# Patient Record
Sex: Female | Born: 1935 | Race: Black or African American | Hispanic: No | Marital: Married | Smoking: Never smoker
Health system: Southern US, Community
[De-identification: ages and names within clinical notes are randomized; demographics above are authoritative.]

## PROBLEM LIST (undated history)

## (undated) DIAGNOSIS — E1122 Type 2 diabetes mellitus with diabetic chronic kidney disease: Secondary | ICD-10-CM

## (undated) DIAGNOSIS — I10 Essential (primary) hypertension: Secondary | ICD-10-CM

## (undated) DIAGNOSIS — J189 Pneumonia, unspecified organism: Secondary | ICD-10-CM

## (undated) DIAGNOSIS — I5030 Unspecified diastolic (congestive) heart failure: Secondary | ICD-10-CM

## (undated) DIAGNOSIS — N184 Chronic kidney disease, stage 4 (severe): Secondary | ICD-10-CM

## (undated) DIAGNOSIS — K219 Gastro-esophageal reflux disease without esophagitis: Secondary | ICD-10-CM

## (undated) DIAGNOSIS — Z8739 Personal history of other diseases of the musculoskeletal system and connective tissue: Secondary | ICD-10-CM

## (undated) DIAGNOSIS — E7800 Pure hypercholesterolemia, unspecified: Secondary | ICD-10-CM

## (undated) DIAGNOSIS — N200 Calculus of kidney: Secondary | ICD-10-CM

## (undated) HISTORY — PX: ABDOMINAL HYSTERECTOMY: SHX81

## (undated) HISTORY — PX: LITHOTRIPSY: SUR834

---

## 1997-12-29 ENCOUNTER — Other Ambulatory Visit: Admission: RE | Admit: 1997-12-29 | Discharge: 1997-12-29 | Payer: Self-pay | Admitting: Family Medicine

## 1999-07-20 ENCOUNTER — Encounter: Payer: Self-pay | Admitting: Emergency Medicine

## 1999-07-20 ENCOUNTER — Emergency Department (HOSPITAL_COMMUNITY): Admission: EM | Admit: 1999-07-20 | Discharge: 1999-07-20 | Payer: Self-pay | Admitting: Emergency Medicine

## 1999-12-03 ENCOUNTER — Encounter: Admission: RE | Admit: 1999-12-03 | Discharge: 1999-12-03 | Payer: Self-pay | Admitting: Family Medicine

## 1999-12-03 ENCOUNTER — Encounter: Payer: Self-pay | Admitting: Family Medicine

## 1999-12-20 ENCOUNTER — Other Ambulatory Visit: Admission: RE | Admit: 1999-12-20 | Discharge: 1999-12-20 | Payer: Self-pay | Admitting: Family Medicine

## 1999-12-22 ENCOUNTER — Encounter: Admission: RE | Admit: 1999-12-22 | Discharge: 1999-12-22 | Payer: Self-pay | Admitting: Family Medicine

## 1999-12-22 ENCOUNTER — Encounter: Payer: Self-pay | Admitting: Family Medicine

## 2000-06-05 ENCOUNTER — Encounter: Payer: Self-pay | Admitting: Otolaryngology

## 2000-06-05 ENCOUNTER — Encounter: Admission: RE | Admit: 2000-06-05 | Discharge: 2000-06-05 | Payer: Self-pay | Admitting: *Deleted

## 2000-12-20 ENCOUNTER — Other Ambulatory Visit: Admission: RE | Admit: 2000-12-20 | Discharge: 2000-12-20 | Payer: Self-pay | Admitting: Family Medicine

## 2001-04-23 ENCOUNTER — Encounter: Admission: RE | Admit: 2001-04-23 | Discharge: 2001-04-23 | Payer: Self-pay | Admitting: Family Medicine

## 2001-04-23 ENCOUNTER — Encounter: Payer: Self-pay | Admitting: Family Medicine

## 2001-09-07 ENCOUNTER — Encounter: Admission: RE | Admit: 2001-09-07 | Discharge: 2001-09-07 | Payer: Self-pay | Admitting: Family Medicine

## 2001-09-07 ENCOUNTER — Encounter: Payer: Self-pay | Admitting: Family Medicine

## 2002-01-08 ENCOUNTER — Other Ambulatory Visit: Admission: RE | Admit: 2002-01-08 | Discharge: 2002-01-08 | Payer: Self-pay | Admitting: Family Medicine

## 2002-03-12 ENCOUNTER — Encounter: Payer: Self-pay | Admitting: Urology

## 2002-03-12 ENCOUNTER — Ambulatory Visit (HOSPITAL_COMMUNITY): Admission: RE | Admit: 2002-03-12 | Discharge: 2002-03-12 | Payer: Self-pay | Admitting: Urology

## 2002-11-22 ENCOUNTER — Encounter: Payer: Self-pay | Admitting: Urology

## 2002-11-28 ENCOUNTER — Inpatient Hospital Stay (HOSPITAL_COMMUNITY): Admission: RE | Admit: 2002-11-28 | Discharge: 2002-12-05 | Payer: Self-pay | Admitting: Urology

## 2002-11-28 ENCOUNTER — Encounter: Payer: Self-pay | Admitting: Urology

## 2002-11-30 ENCOUNTER — Encounter: Payer: Self-pay | Admitting: Urology

## 2002-12-03 ENCOUNTER — Encounter: Payer: Self-pay | Admitting: Urology

## 2002-12-16 ENCOUNTER — Emergency Department (HOSPITAL_COMMUNITY): Admission: EM | Admit: 2002-12-16 | Discharge: 2002-12-16 | Payer: Self-pay | Admitting: Emergency Medicine

## 2003-03-24 ENCOUNTER — Ambulatory Visit (HOSPITAL_COMMUNITY): Admission: RE | Admit: 2003-03-24 | Discharge: 2003-03-24 | Payer: Self-pay | Admitting: Urology

## 2003-04-15 ENCOUNTER — Ambulatory Visit (HOSPITAL_COMMUNITY): Admission: RE | Admit: 2003-04-15 | Discharge: 2003-04-15 | Payer: Self-pay | Admitting: Urology

## 2003-12-09 ENCOUNTER — Other Ambulatory Visit: Admission: RE | Admit: 2003-12-09 | Discharge: 2003-12-09 | Payer: Self-pay | Admitting: Family Medicine

## 2006-02-16 ENCOUNTER — Ambulatory Visit (HOSPITAL_COMMUNITY): Admission: RE | Admit: 2006-02-16 | Discharge: 2006-02-16 | Payer: Self-pay | Admitting: Urology

## 2006-07-26 ENCOUNTER — Encounter: Admission: RE | Admit: 2006-07-26 | Discharge: 2006-07-26 | Payer: Self-pay | Admitting: Family Medicine

## 2006-12-06 ENCOUNTER — Encounter (HOSPITAL_BASED_OUTPATIENT_CLINIC_OR_DEPARTMENT_OTHER): Admission: RE | Admit: 2006-12-06 | Discharge: 2007-01-09 | Payer: Self-pay | Admitting: Internal Medicine

## 2007-01-10 ENCOUNTER — Encounter (HOSPITAL_BASED_OUTPATIENT_CLINIC_OR_DEPARTMENT_OTHER): Admission: RE | Admit: 2007-01-10 | Discharge: 2007-02-07 | Payer: Self-pay | Admitting: Surgery

## 2007-01-13 ENCOUNTER — Ambulatory Visit (HOSPITAL_COMMUNITY): Admission: RE | Admit: 2007-01-13 | Discharge: 2007-01-13 | Payer: Self-pay | Admitting: Surgery

## 2007-01-30 ENCOUNTER — Ambulatory Visit (HOSPITAL_COMMUNITY): Admission: RE | Admit: 2007-01-30 | Discharge: 2007-01-30 | Payer: Self-pay | Admitting: Urology

## 2007-02-01 ENCOUNTER — Encounter: Admission: RE | Admit: 2007-02-01 | Discharge: 2007-02-01 | Payer: Self-pay | Admitting: Interventional Radiology

## 2007-02-21 ENCOUNTER — Ambulatory Visit (HOSPITAL_COMMUNITY): Admission: RE | Admit: 2007-02-21 | Discharge: 2007-02-21 | Payer: Self-pay | Admitting: Interventional Radiology

## 2007-02-27 ENCOUNTER — Inpatient Hospital Stay (HOSPITAL_COMMUNITY): Admission: RE | Admit: 2007-02-27 | Discharge: 2007-03-02 | Payer: Self-pay | Admitting: Urology

## 2007-07-30 ENCOUNTER — Encounter: Admission: RE | Admit: 2007-07-30 | Discharge: 2007-07-30 | Payer: Self-pay | Admitting: Family Medicine

## 2007-12-06 ENCOUNTER — Encounter: Admission: RE | Admit: 2007-12-06 | Discharge: 2007-12-06 | Payer: Self-pay | Admitting: Family Medicine

## 2008-07-22 ENCOUNTER — Emergency Department (HOSPITAL_COMMUNITY): Admission: EM | Admit: 2008-07-22 | Discharge: 2008-07-23 | Payer: Self-pay | Admitting: Emergency Medicine

## 2010-05-01 ENCOUNTER — Encounter: Payer: Self-pay | Admitting: Urology

## 2010-08-24 NOTE — Assessment & Plan Note (Signed)
[facility]   NAME:  Doris Howell, Doris Howell          ACCOUNT NO.:  [id number]   MEDICAL RECORD NO.:  [id number]      DATE OF BIRTH:  Sep 05, 1935   PHYSICIAN:  Joneen Boers A. Nils Pyle, M.D. VISIT DATE:  12/13/2006                                   OFFICE VISIT   REASON FOR CONSULTATION:  Ms. Patella is a 75 year old lady who is  referred by Dr. Lowella Bandy for evaluation of a draining sinus on the left  lateral trunk.   IMPRESSION:  Deep hematoma with probable chronic sinus and hematoma.   RECOMMENDATIONS:  The wound was explored, evacuated, biopsied and  cultured in the clinic.  We placed a red rubber catheter in the depth of  the cavity, and we will begin daily irrigations with saline.  Antibiotics will be added contingent upon the culture result.   SUBJECTIVE:  Ms. Nakada is a 75 year old lady who has been in good  health for the past years with no major complaints.  She underwent a  nephrolithectomy in 2004 and without complications.  In August 2007, she  noted a pustule which drained.  It was unassociated with fever or  malodor.  She has been seen on multiple occasions by Dr. Janice Norrie, but there  continues to be drainage.  Throughout this period, she has had no  malodor and she has had no pain, induration.  She continues a relatively  normal activity.   PAST MEDICAL HISTORY:  Remarkable for drug reactions to Coricidin.  She  is being managed for hypertension, reflux esophagitis.  She has had, in  addition to her nephrolithectomy, a hysterectomy in the 70s.   CURRENT MEDICATION LIST INCLUDES:  Cartia XL 240 mg daily, lisinopril 20  mg daily, hydrochlorothiazide 25 mg daily, pantoprazole 40 mg daily, and  aspirin 81 mg daily.   FAMILY HISTORY:  Positive for diabetes, hypertension, stroke and cancer.   Socially, she is married with adult children who live remotely.  She is  retired from [employer].   REVIEW OF SYSTEMS:  She has no exercise intolerance.  She denies  chest  pain.  There has been no episodes of syncope or changes in visual  fields.  Her bowel or bladder function are normal.  She has never  smoked.  She denies hemoptysis.  The remainder of the review of systems  is negative.   PHYSICAL EXAMINATION:  GENERAL:  She is an alert, oriented female in no  acute distress.  VITAL SIGNS:  She is 5 feet 5 inches tall, 193 pounds.  The blood  pressure is 168/81, respirations 18, pulse rate 66, temperature is 98.2.  HEENT:  Exam is clear.  NECK:  Supple.  Trachea is midline.  Thyroid is nonpalpable.  LUNGS:  Clear.  CARDIAC:  The heart sounds were distant.  ABDOMEN:  Soft.  EXTREMITIES:  Warm with palpable pulses.  LEFT FLANK:  Inspection of the left flank shows a sinus which, on probe  with a Q-tip, extends down 5-1/2 cm.  This wound was anesthetized with  EMLA cream and thereafter a wound curette we used to debride the wall.  The depth of the sinus was cultured, along with the scrapings from the  sides of the sinus were forwarded to pathology.  A red rubber catheter  was  then fashioned and placed into the depths of the wound and the  cavity was irrigated with a total of 30 mL, with observation of a clear  efflux.   DISCUSSION:  The physical exam and history is consistent with an old  hematoma which spontaneously ruptured, but has been inadequately  drained.  Placing the red rubber catheter in the depth of the wound and  instituting daily irrigations should provide for a closure of this  cavity, and if is in fact infected, it should be allowed to drain and  avoid the development of an abscess.   We have explained this clinical approach to the patient in terms that  she seems to understand.  Her husband is not a candidate to help her in  the management of this wound.  We are therefore seeing her daily in the  wound center.  We have given the patient an opportunity to ask  questions.  She seems to understand the diagnosis and the treatment   procedures and expresses gratitude for having been seen in the clinic.      Harold A. Nils Pyle, M.D.  Electronically Signed     HAN/MEDQ  D:  12/13/2006  T:  12/13/2006  Job:  [id number]   cc:   Hanley Ben, M.D.

## 2010-08-24 NOTE — Assessment & Plan Note (Signed)
[facility]   NAME:  Doris Howell, Doris Howell          ACCOUNT NO.:  [id number]   MEDICAL RECORD NO.:  [id number]      DATE OF BIRTH:  02-Jan-1936   PHYSICIAN:  Joneen Boers A. Nils Pyle, M.D. VISIT DATE:  01/11/2007                                   OFFICE VISIT   SUBJECTIVE:  Doris Howell is a 75 year old lady who has had a  persistent draining sinus over the left flank.  Her previous incision  was for a nephrolithotomy.  In the interim, we have treated her with  controlled sinus with irrigation of saline.  She continues to have  moderate drainage.  She has had a positive culture, for which she is on  Septra.  She complains of some mild increase in discomfort, but has  fever.  She has not been nauseated.  She continues to be ambulatory.  The drainage is essentially unchanged in quantity and character.   OBJECTIVE:  VITAL SIGNS:  Blood pressure is 158/71, respirations 16,  pulse rate 70, temperature is 98.  EXTREMITIES:  Inspection of the wound shows that there is mild to  moderate maceration in the immediate area around the red rubber  catheter.  The drainage is essentially unchanged.  There is no excessive  malodor.  A probe of the wound with a Q-tip shows essentially no changes  in the detentions of the sinus.  There is no evidence of fluctuance or  abscess formation.  She remains tender in the area to deep palpation  only.   ASSESSMENT:  Chronic draining sinus left flank.   PLAN:  We have discussed the clinical findings and physical findings  with the radiologist.  We have inquired as to the best study to discern  the depth of the sinus and whether or not it is involved with the 12th  rib with an osteomyelitic process.  The radiologist has recommended that  we proceed with a left flank MRI with contrast.   We are recommending that we proceed with the MRI with contrast.  We have  explained the objective of this study is to define the limits of this  sinus.  I  suspect that we may a  chronic osteal involving the 12th rib.  We have explained and discussed this suspicion with the patient in terms  that she seems to understand.  We have indicated the necessity to  proceed with a contrast study.  We have given the patient opportunity to  ask questions.  She seems to be satisfied with the explanation, and is  anxious to proceed.  We will schedule her as the radiology schedule  permits.  We will reevaluate the patient in one week.  With regard to  the sinus, we will leave the red rubber catheter in place.  We have  instructed the patient in the changing of the dressing as an only an as  needed basis.  We have emphasized the patient should not have fever.  She should not have a progressive pain or malodor.  If any of these  should occur, she will call the wound clinic in the interim.  Otherwise,  we will reevaluate her in one week.      Harold A. Nils Pyle, M.D.  Electronically Signed     HAN/MEDQ  D:  01/11/2007  T:  01/11/2007  Job:  [id number]   cc:   Mikael Spray

## 2010-08-24 NOTE — Assessment & Plan Note (Signed)
[facility]   NAME:  Doris Howell, Doris Howell          ACCOUNT NO.:  [id number]   MEDICAL RECORD NO.:  [id number]      DATE OF BIRTH:  1935-10-23   PHYSICIAN:  Joneen Boers A. Nils Pyle, M.D. VISIT DATE:  01/17/2007                                   OFFICE VISIT   SUBJECTIVE:  Ms. Doris Howell returns for follow-up of a draining sinus  from her left flank.  In the interim, she has undergone an MRI.  She  reports that there has been continued drainage of similar volume and  content.  There has been no fever.  There has been no malodor.  She  continues on Septra.   OBJECTIVE:  Blood pressure is 166/75, respirations 18, pulse rate 72,  temperature 98.3.  Inspection of the wound shows that the fistula remains well controlled  with a bloody serous drainage.  The amount appears to be less than 10 mL  over 24 hours.  There is no malodor. Sounding of the wound with a Q-tip  discerns that there are no loculations and there is no increase in  drainage accompaning the sounding.  Review of the MRI report per Dr. Lin Landsman has discerned an enhancing fluid  collection on the left flank.  The fistula extending into the skin is  demonstrated. There is no evidence of 12 rib osteomyelitis.   ASSESSMENT:  Inadequate drainage of inflammatory phlegmon.   PLAN:  We are recommending that the patient be considered for an open  exploration of the wound for debridement and drainage under general  anesthesia.  We have discussed this recommendation with the patient and  we will discharge the patient from active management in the [facility]  and return her to Dr. Janice Howell the urologist with this recommendation.  In  the interim, we will continue to manage to change the dressings in the  wound center if the interval between recommendation and appointment with  Dr.  Janice Howell exceeds 1 week.   We have given the patient an opportunity to ask questions with regard to  the clinical impression of the recommendation.   She seems to understand.  She expresses gratitude for having been seen in the clinic.      Harold A. Nils Pyle, M.D.  Electronically Signed     HAN/MEDQ  D:  01/17/2007  T:  01/17/2007  Job:  [id number]   cc:   Hanley Ben, M.D.

## 2010-08-24 NOTE — Assessment & Plan Note (Signed)
[facility]   NAME:  Doris Howell, DARRINGTON          ACCOUNT NO.:  [id number]   MEDICAL RECORD NO.:  [id number]      DATE OF BIRTH:  06/04/1935   PHYSICIAN:  Joneen Boers A. Nils Pyle, M.D. VISIT DATE:  12/20/2006                                   OFFICE VISIT   SUBJECTIVE:  Ms. Rayle is a 75 year old lady who we follow for a  chronic draining sinus on the left flank.  We have treated her by  placing a red rubber catheter for a stent and daily irrigations with  saline.  In the interim, there has been no increase in drainage.  There  has been no malodor.  In fact, the drainage has changed from a  serosanguineous drainage to a clear return.  There has been no pain.  The patient continues to be ambulatory.  There has been no malodor.   OBJECTIVE:  VITAL SIGNS:  Blood pressure is 177/94, respirations 18,  pulse rate 80, temperature 98.  WOUND:  Inspection of the wound shows that there is mild reaction to the  silk suture that is holding the red rubber catheter in.  There is no  peri-wound induration.  There is no malodor.  The red rubber catheter  was advanced 2 cm without difficulty.  Thereafter, an irrigant was  placed.  The efflux was clear without evidence of purulence or malodor.   ASSESSMENT:  Clinical improvement hematoma.   PLAN:  We will continue the previous treatment with weekly advancements  of the red rubber catheter.      Harold A. Nils Pyle, M.D.  Electronically Signed     HAN/MEDQ  D:  12/20/2006  T:  12/21/2006  Job:  [id number]

## 2010-08-24 NOTE — Discharge Summary (Signed)
Doris Howell, Doris Howell          ACCOUNT NO.:  [id number]   MEDICAL RECORD NO.:  [id number]          PATIENT TYPE:  INP   LOCATION:  [id number]                         [facility]ACILITY:  [facility]   PHYSICIAN:  Hanley Ben, M.D.  DATE OF BIRTH:  1935-12-07   DATE OF ADMISSION:  02/27/2007  DATE OF DISCHARGE:  03/02/2007                               DISCHARGE SUMMARY   DISCHARGE DIAGNOSIS:  1. Left renal stone with a sinus tract left flank.  2. Hypertension.   PROCEDURE:  PCNL left kidney stone on February 27, 2007.   The patient is a 75 year old female who had anatrophic nephrolithotomy 5  years ago.  For about a year, she has been having a sinus draining tract  in the left flank and the tract has not healed.  She was seen at [place] by Dr. Nils Pyle and an MRI of the sinus tract showed a collection of  fluid in the left flank.  The patient was scheduled for drainage of the  left flank fluid collection.  She was then found to have a stone in the  kidney instead of a fluid collection.  The nephrostomy tube was left in  place and she was admitted on February 27, 2007 for percutaneous  nephrolithotomy.   PHYSICAL EXAMINATION:  Blood pressure was 173/78, pulse 63, respirations  18, temperature 97.5 and weight 153 pounds.  LUNGS:  Clear.  HEART:  Regular rhythm.  ABDOMEN:  Obese, nontender, no CVA tenderness.  Liver, spleen and  kidneys not palpable.  Bowel sounds normal.   Hemoglobin on November 14 was 10.4, hematocrit 30.9, WBC 5.2,  BUN 16,  creatinine 1.25, sodium 140, potassium 4.1, glucose 135. Urine culture  was negative.   She had left percutaneous nephrolithotomy on February 27, 2007.  Postoperatively she complained of nausea and epigastric pain.  She was  otherwise doing well.  She had left flank pain. On March 02, 2007,  she was afebrile, she was voiding well.  The nephrostomy catheter was  removed on March 01, 2007.  The wound was clean and she had started  past passing  flatus and she did not have any more nausea or epigastric  pain.  She was then discharged home on Colace 100 mg twice a day, Cipro  250 mg twice a day, all her home medications.  She will be followed in  the office next week for removal of sutures.      Hanley Ben, M.D.  Electronically Signed     MN/MEDQ  D:  03/02/2007  T:  03/02/2007  Job:  [id number]

## 2010-08-24 NOTE — Assessment & Plan Note (Signed)
[facility]   NAME:  Doris Howell, Doris Howell          ACCOUNT NO.:  [id number]   MEDICAL RECORD NO.:  [id number]      DATE OF BIRTH:  03-13-1936   PHYSICIAN:  Joneen Boers A. Nils Pyle, M.D. VISIT DATE:  12/28/2006                                   OFFICE VISIT   SUBJECTIVE:  Doris Howell is a 75 year old lady whom we are following  with a sinus over the left flank.  In the interim she has had  irrigations performed utilizing saline on a daily basis.  She reports  that she has had no excessive pain, but she does feel some tenderness  with motion and coughing.  There has been no fever.  There is been no  malodor.   OBJECTIVE:  Blood pressure is 150/72, respirations 16, pulse rate 66,  temperature 98.8.  Inspection of the left flank shows that the red rubber catheter is in  place.  There is a mucoserous drainage without odor that was cultured.  The red rubber was removed.  Utilizing a Q-tip the wound was probed and  extends down to 7 cm with a cylindrical sinus at the depth.  There  appears to be a shrinkage of the previous area.  The sinus cavity is  quite friable, but is nonpainful.  Multiple irrigants of normal saline  were instilled into the wound.  Thereafter the skin was prepped, a  Tegaderm was used to protect the skin, the red rubber was reinserted to  the depth of the wound and secured with Hypafix.   ASSESSMENT:  Clinical improvement of the sinus per sounding exam.   PLAN:  We will continue irrigations and if there is not clear evidence  of shrinkage of the sinus, we will move with a sinography or sinogram  performed in radiology.  This chronic wound may well be related to the  rib and or cartilage.  We have explained this approach to the patient in  terms that she seems to understand.  She is receptive to this plan and  indicates that she will be compliant.      Harold A. Nils Pyle, M.D.  Electronically Signed     HAN/MEDQ  D:  12/28/2006  T:  12/28/2006   Job:  [id number]   cc:   Doris Howell, M.D.

## 2010-08-24 NOTE — Assessment & Plan Note (Signed)
[facility]   NAME:  EZMERELDA, MACCHIONE          ACCOUNT NO.:  [id number]   MEDICAL RECORD NO.:  [id number]      DATE OF BIRTH:  June 06, 1935   PHYSICIAN:  Joneen Boers A. Nils Pyle, M.D. VISIT DATE:  01/01/2007                                   OFFICE VISIT   SUBJECTIVE:  Ms. Novicki returns for followup of a draining flank  incision, which we have attributed to a deep hematoma with possible  infection.  In the interim, she has had a culture which has grown  Proteus mirabilis.  The sinus tract is still adequately drained with a  red rubber.  There is no extension of erythema and no malodor.  There is  no tenderness on palpation.   OBJECTIVE:  Review of the cultures shows a Proteus mirabilis sensitive  to Sulfa.   PLAN:  We have started the patient on Septra DS one p.o. b.i.d.  We will  continue the daily irrigations.      Harold A. Nils Pyle, M.D.  Electronically Signed     HAN/MEDQ  D:  01/01/2007  T:  01/02/2007  Job:  [id number]

## 2010-08-24 NOTE — H&P (Signed)
[name], [name]          ACCOUNT NO.:  [id number]   MEDICAL RECORD NO.:  [id number]          PATIENT TYPE:  INP   LOCATION:  [id number]                         FACILITY:  [facility]   PHYSICIAN:  Doris Howell, M.D.  DATE OF BIRTH:  08-07-1935   DATE OF ADMISSION:  02/27/2007  DATE OF DISCHARGE:                              HISTORY & PHYSICAL   CHIEF COMPLAINT:  Left renal stone.   The patient is a 75 year old female who had anatrophic nephrolithotomy 5  years ago.  For about a year she has been having a sinus draining tract  in the left flank.  The tract has not healed.  She was seen by Dr.  Nils Howell, and an MRI of the sinus tract showed a collection of fluid in  the left flank.  The patient was scheduled for drainage of the left  flank collection, and she was then found to have a stone instead of a  fluid collection in the left flank.  A nephrostomy tube was then left in  the kidney, and she is scheduled today for percutaneous nephrolithotomy.   PAST MEDICAL HISTORY:  Is positive:  1. Hypertension.  2. Esophageal reflux.   PAST SURGICAL HISTORY:  She had an anatrophic nephrolithotomy about 5  years ago.   MEDICATIONS:  1. Aspirin 81 mg.  2. Cartia XT 240 mg.  3. Lisinopril  20 mg.  4. Triamterene/hydrochlorothiazide  37.5/25 mg.   ALLERGIES:  No known drug allergies.   FAMILY HISTORY:  Is positive for diabetes, hypertension.   SOCIAL HISTORY:  She is married and does not smoke nor drink.   REVIEW OF SYSTEMS:  Is negative.   PHYSICAL EXAMINATION:  GENERAL:  This is a well built 75 year old female  who is in no acute distress.  She is alert and oriented.  VITAL SIGNS:  Blood pressure is 173/78, pulse 63, respirations 18,  temperature 97.5 and weight 153 pounds.  HEENT:  Her head is normal. She has pink conjunctivae.  Ears, nose and  throat are within normal limits.  NECK:  Supple.  No cervical adenopathy.  No thyromegaly.  CHEST:  Symmetrical.  Lungs are fully  expanded and clear to percussion  and auscultation.  HEART:  Regular rhythm.  ABDOMEN:  Moderately obese, soft and nontender.  She has no CVA  tenderness.  Liver, spleen and kidneys are not palpable.  She has a  nephrostomy catheter in the left kidney.  She has no inguinal hernia.  Bladder is not distended.  EXTREMITIES:  Within normal limits   IMPRESSION:  1,  Left renal stone.  1. Hypertension.      Doris Howell, M.D.  Electronically Signed     MN/MEDQ  D:  02/27/2007  T:  02/27/2007  Job:  [id number]

## 2010-08-24 NOTE — Op Note (Signed)
NAMECLINT, Doris Howell          ACCOUNT NO.:  [id number]   MEDICAL RECORD NO.:  [id number]          PATIENT TYPE:  INP   LOCATION:  [id number]                         FACILITY:  [facility]   PHYSICIAN:  Hanley Ben, M.D.  DATE OF BIRTH:  October 12, 1935   DATE OF PROCEDURE:  02/27/2007  DATE OF DISCHARGE:                               OPERATIVE REPORT   PREOPERATIVE DIAGNOSIS:  Left renal calculus.   POSTPROCEDURE DIAGNOSIS:  Left renal calculus.   PROCEDURE:  Left percutaneous nephrolithotomy.   SURGEONS:  Arvil Persons, M.D., and Art A. Hoss, M.D.   ANESTHESIA:  General.   INDICATIONS:  The patient is a 75 year old female who had a left  anatrophic nephrolithotomy about 5 years ago.  For about a year she has  been having a sinus draining tract in the left flank.  The tract has not  healed.  She was seen by Dr. Nils Pyle of wound care.  An MRI of the sinus  tract was done and showed a collection of fluid in the left flank.  The  patient was scheduled for percutaneous drainage of the flank fluid.  However, at the time of the procedure she was found to have a stone  instead of a fluid collection.  A nephrostomy catheter was then left in  place by Dr. Barbie Banner and she is scheduled today for percutaneous  nephrolithotomy.   Under general anesthesia the patient was prepped and draped and placed  in the prone position.  The nephrostomy tract was dilated with  difficulty because of previous surgery by Dr. Barbie Banner.  Then the  nephroscope was passed through the Amplatz sheath and the stone was then  visualized in the kidney.  With the lithoclast, the stone was fragmented  in multiple stone fragments that were removed with the suction.  Then a  KUB at the end of the procedure showed no evidence of  remaining stone  fragment.  Then a #20-French Councill-tip catheter was passed over one  of the guidewires  into the renal pelvis.   The patient tolerated the procedure well and left the OR in satisfactory  condition to post anesthesia care unit.      Hanley Ben, M.D.  Electronically Signed     MN/MEDQ  D:  02/27/2007  T:  02/28/2007  Job:  [id number]

## 2010-08-27 NOTE — Op Note (Signed)
NAME:  Doris Howell, Doris Howell                    ACCOUNT NO.:  [id number]   MEDICAL RECORD NO.:  [id number]                   PATIENT TYPE:  INP   LOCATION:  0004                                 FACILITY:  [facility]   PHYSICIAN:  Hanley Ben, M.D.               DATE OF BIRTH:  Apr 09, 1936   DATE OF PROCEDURE:  11/28/2002  DATE OF DISCHARGE:                                 OPERATIVE REPORT   PREOPERATIVE DIAGNOSIS:  Left staghorn renal calculus.   POSTOPERATIVE DIAGNOSIS:  Left staghorn renal calculus.   PROCEDURE:  Left renal exploration, left anatrophic nephrolithotomy, left  intraoperative renal ultrasound.   SURGEON:  Hanley Ben, M.D.   ASSISTANT:  Berdie Ogren, M.D. and Synthia Innocent, M.D.   ANESTHESIA:  General endotracheal.   SPECIMENS:  None.   ESTIMATED BLOOD LOSS:  1500 mL.   DRAINS:  46 French Foley catheter to straight drain, 6 x 24 cm double J  ureteral stent.   INDICATIONS FOR PROCEDURE:  Doris Howell is a 75 year old female who has a  known history of renal calculi. She has undergone extracorporal shockwave  lithotripsy for stones several years ago by Dr. Joelyn Oms. She has been  evaluated recently for mild left flank pain and microscopic hematuria. She  was found on KUB to have a large left staghorn calculus that comprises her  entire left renal pelvis and collecting system. After careful counseling as  how best to treat this stone, the patient has decided on anatrophic  nephrolithotomy after understanding the risks and the benefits.   DESCRIPTION OF PROCEDURE:  The patient was brought to the operating room and  correctly identified by her identification bracelet. She was given  preoperative antibiotics and general endotracheal anesthesia. She was  prepped and draped in typical sterile fashion. A Foley catheter was placed  prior to the start of the case. The patient was placed with her left flank  up. A skin incision was made along the course of the  twelfth rib and  extended approximately 10 cm medially curving in a cephalad direction. The  incision was made with a knife and then Bovie electrocautery was used to  incise through the fat and muscle wall layers. The Bovie was used to incise  down to the level of the twelfth rib. The Bovie was then used to clean the  tissue off of the rib approximately 5 cm posteriorly. The rib was grasped  with a Kocher clamp and was removed with rib cutters. The remaining muscle  layers were then incised down to Gerota's fascia. The peritoneal reflection  was identified and was pushed medially to avoid entering the abdominal  cavity. The neurovascular bundle associated with the twelfth rib was  identified and care was taken not to damage it. Blunt dissection was used to  mobilize copious retroperitoneal fat to expose Gerota's fascia. Gerota's  fascia was incised in a longitudinal fashion to expose perirenal fat. Blunt  dissection  and electrocautery were used to open the perirenal fat exposing  the capsule of the kidney. The Omnitract retractor was placed for adequate  exposure to the retroperitoneal area. The perirenal fat was slowly dissected  away down to the lower pole. The tissue beneath the lower pole was carefully  dissected exposing the ureter. A vessiloop was placed around the ureter and  the gonadal vessels. The ureter was then dissected up to the area of the  hilum. The kidney was then freed superiorly and the upper pole was thus  mobilized. Attention was then turned to the hilum of the kidney. First, the  renal vein was encountered and a vessiloop was placed around this for  adequate control. The kidney was then flipped anteriorly and the left renal  artery was identified posteriorly. A vessiloop was also placed around the  artery. The artery was traced up to the hilum to the level of branching.  There is a very small anterior branch and quite large posterior branch that  was encountered. The  patient was given a 20 mL injection of methylene blue  as well as 20 mg of mannitol. What was thought was the posterior segmental  artery was clamped with a shotted bulldog clamp. This was done to decrease  the blood supply to the posterior segment and identify the avascular plane  of Brodel. 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 It was felt that the small  anterior branch probably only supplied the apical segment and there was  further branching of the artery that was clamped into a posterior segment.  However, it was not possible to dissect the renal artery any further into  the hilum without getting to the parenchyma. This clamp was taken off and  applied to the main renal artery. A bowel bag was placed around the hilum of  the kidney and cinched down. The kidney was then iced down for approximately  10 minutes. The renal capsule was scored approximately 1 cm posterior to the  midline of the renal cortex. A knife handle was then used for dissection  through the parenchyma. There was some heavy bleeding at first which was  controlled by over sewing medium size vessels within the parenchyma that  were encountered. The stone could be easily palpated and a knife was used to  dissect through the calices exposing the stone. The incision was carried  down to the lower pole to free up the stone in its entirety. The stone was  then removed intact. Electrocautery and 4-0 Chromic suture ligatures were  used to control further bleeding. At this point, the bleeding was quite  minimal. A 6 French 24 cm double J ureteral stent was passed antegrade from  the collecting system into the ureter and down into the bladder.  Intraoperative ultrasonography was then done to ensure that no further stone  fragments remained and indeed no calcific densities could be appreciated.  The bull dog clamp was then removed from the renal artery and no  bleeding was noted at this point. The total cold ischemia time was approximately 45  minutes. The upper, middle and lower pole calices were then approximated  with 4-0 Chromic suture. The renal capsule was then closed with five  interrupted figure-of-eight sutures to the capsule and perirenal fat to  approximate this. More perirenal fat was then closed over the kidney and  this was tacked in place with 4-0 Chromic suture. A Blake drain  was placed  by making a stab incision inferior to the wound passing a tonsil through the  body wall and grabbing the drain and pulling it through the body wall. The  wound was copiously irrigated with antibiotic solution. Inspection was done  to assess for further bleeding and none was found. The lung was inflated  with irrigation within the wound and no signs of a pneumothorax were  present. The muscle layers were then closed three layers with #1 PDS in a  running fashion. The wound was then irrigated once more and the skin was  closed with a stapling device. The patient then awakened easily from her  anesthesia. She was taken to post anesthesia care unit in stable condition.  The patient was transfused 2 units of packed red blood cells having lost  approximately 1500 mL with ISTAT hemoglobin of 7.6 in the operating room.  Please note that Dr. Janice Norrie is the primary surgeon and participated in all  aspects of the case. There were no complications.     Berdie Ogren, MD                           Hanley Ben, M.D.    DR/MEDQ  D:  11/28/2002  T:  11/28/2002  Job:  [id number]

## 2010-08-27 NOTE — H&P (Signed)
NAME:  Doris Howell, Doris Howell                    ACCOUNT NO.:  [id number]   MEDICAL RECORD NO.:  [id number]                   PATIENT TYPE:  INP   LOCATION:  0004                                 FACILITY:  [facility]   PHYSICIAN:  Hanley Ben, M.D.               DATE OF BIRTH:  1935-07-07   DATE OF ADMISSION:  11/28/2002  DATE OF DISCHARGE:                                HISTORY & PHYSICAL   CHIEF COMPLAINT:  Left staghorn calculi.   HISTORY OF PRESENT ILLNESS:  The patient is a 75 year old female who was  found on CT scan to have a left staghorn calculi.  She was seen in October  2000 for gross hematuria, and a CT scan then showed a left staghorn calculi.  Treatment options were discussed with the patient, and it was felt that she  would benefit from anatrophic nephrolithotomy.  The risks, benefits, and  alternatives were discussed with the patient and her husband, and they  agreed.  She is admitted today for the procedure.   PAST MEDICAL HISTORY:  She had an ESR of the left lower pole stone in  December 1994.  She also has a history of hypertension.   PAST SURGICAL HISTORY:  She had a hysterectomy in 1974.   MEDICATIONS:  1. Cardizem 240 mg daily.  2. Maxzide 25 mg daily.  3. Nexium 40 mg daily.  4. Lipitor 10 mg daily.  5. Zestril 20 mg daily.   ALLERGIES:  She has no known drug allergies.   SOCIAL HISTORY:  She is married.  She does not smoke or drink.   FAMILY HISTORY:  Noncontributory.   REVIEW OF SYSTEMS:  No cough, no shortness of breath, no hemoptysis.  CARDIOVASCULAR:  No palpitations, no chest pain.  GI:  No nausea, no  vomiting, no diarrhea or constipation.  GU:  As per history.   PHYSICAL EXAMINATION:  GENERAL:  This is a well-developed 75 year old female  in no acute distress.  VITAL SIGNS:  Blood pressure is 140/80, pulse 61, respirations 18, weight  205 pounds, and temperature 97.2.  HEENT:  Head is normal.  Pupils are equal and reactive to light and  accommodation.  Ears, nose, and throat within normal limits.  NECK:  Supple.  No cervical lymph nodes.  No thyromegaly.  CHEST:  Symmetrical.  LUNGS:  Fully extended and clear to percussion and auscultation.  HEART:  Regular rate.  No murmur.  No gallops.  ABDOMEN:  Soft, nondistended.  Moderately obese.  Liver, spleen, and kidneys  not palpable.  No organomegaly.  Bowel sounds normal.  GENITALIA:  Normal female genitalia.  EXTREMITIES:  Within normal limits.   IMPRESSION:  1. Left staghorn calculi.  2. Hypertension.  Hanley Ben, M.D.    MN/MEDQ  D:  11/28/2002  T:  11/28/2002  Job:  [id number]

## 2010-08-27 NOTE — Discharge Summary (Signed)
NAMEMarland Kitchen  Doris Howell, Doris Howell                    ACCOUNT NO.:  [id number]   MEDICAL RECORD NO.:  [id number]                   PATIENT TYPE:  INP   LOCATION:  [id number]                                 FACILITY:  [facility]   PHYSICIAN:  Hanley Ben, M.D.               DATE OF BIRTH:  10/22/1935   DATE OF ADMISSION:  11/28/2002  DATE OF DISCHARGE:  12/05/2002                                 DISCHARGE SUMMARY   DISCHARGE DIAGNOSES:  1. Left staghorn calculi.  2. Hypertension.   PROCEDURE DONE:  Left anatrophic nephrolithotomy on November 28, 2002.   HISTORY:  The patient is a 75 year old female, who was found on CT scan to  have left staghorn calculi.  She was seen in October 2003 for gross  hematuria, and a CT scan then showed a left staghorn calculi, and she was  admitted on November 28, 2002, for a left nephrolithotomy.   PHYSICAL EXAMINATION:  VITAL SIGNS:  Blood pressure 140/80, pulse 61,  respiration 18, weight 205 pounds, temperature 97.2.  HEENT:  Head is normal, pupils equal and reactive to light and  accommodation.  Ears, nose, and throat within normal limits.  NECK:  Supple.  No cervical lymph nodes, no thyromegaly.  LUNGS:  Clear to percussion and auscultation.  HEART:  Regular rhythm.  No murmurs, rubs, or gallops.  ABDOMEN:  Soft, protuberant.  Liver, spleen, and kidneys not palpable.  Bowel sounds normal.  GENITALIA:  She had normal female genitalia.   Her hemoglobin on admission was 11, hematocrit 32.3, WBC 8.3.  PT and PTT  were within normal limits.  Sodium 144, potassium 4.5, BUN 24, creatinine  1.3.  Urinalysis showed too numerous to count WBCs, 7-10 RBCs, and rare  bacteria.  Chest x-ray showed cardiomegaly and atelectasis in the bases.  EKG showed nonspecific ST and T-wave abnormalities.   The patient had a left anatrophic nephrolithotomy on November 28, 2002.  She  received two units of packed cells during the procedure.  She spiked a  temperature up to 102 on the  third day postop, and chest x-ray showed  atelectasis.  She was then put on ampicillin and gentamicin, and her  temperature came down.  She was then started on liquid diet which she  tolerated.  Her diet was then gradually advanced.  However, the patient was  still complaining of severe flank and abdominal pain.  However, she  continued to improve and on August 26, she was afebrile.  She was eating  well.  She had a bowel movement.  Her wound was clean and dry.  The Foley  catheter was removed on the fifth day postop, and she has been voiding well  since.  A KUB showed a remaining stone fragment about 1 x 1 cm in the mid to  lower pole of the kidney.  She was discharged home on December 05, 2002,   DISCHARGE MEDICATIONS:  1. Percocet 5/325, 1-2  tabs q.4h. p.r.n. for pain  2. Avalox 400 mg p.o. daily.  3. Protonix 40 mg p.o. daily.  4. Maxzide 25 mg daily.  5. Cardizem 240 mg daily.  6. Lipitor 10 mg daily.  7. Zestril 20 mg daily.   CONDITION ON DISCHARGE:  Improved.   DISCHARGE DIET:  Regular.   The patient is instructed not to do any lifting, straining, driving, or  engage in any sexual activities until further advised.  Her creatinine on  December 04, 2002 was 1.5.   The patient will be followed in my office in about three weeks, and she will  be subsequently treated with ESL for the remaining stone fragment in the  lower pole of the kidney.                                               Hanley Ben, M.D.    MN/MEDQ  D:  12/05/2002  T:  12/05/2002  Job:  [id number]

## 2011-01-18 LAB — URINALYSIS, ROUTINE W REFLEX MICROSCOPIC
Bilirubin Urine: NEGATIVE
Glucose, UA: NEGATIVE
Ketones, ur: NEGATIVE
Nitrite: NEGATIVE
Protein, ur: 100 — AB
Specific Gravity, Urine: 1.024
Urobilinogen, UA: 0.2
pH: 6

## 2011-01-18 LAB — COMPREHENSIVE METABOLIC PANEL
ALT: 21
AST: 24
Albumin: 3.8
Alkaline Phosphatase: 100
BUN: 16
CO2: 28
Calcium: 9.4
Chloride: 102
Creatinine, Ser: 1.25 — ABNORMAL HIGH
GFR calc Af Amer: 51 — ABNORMAL LOW
GFR calc non Af Amer: 42 — ABNORMAL LOW
Glucose, Bld: 135 — ABNORMAL HIGH
Potassium: 4.1
Sodium: 140
Total Bilirubin: 0.8
Total Protein: 7.7

## 2011-01-18 LAB — ABO/RH: ABO/RH(D): O POS

## 2011-01-18 LAB — CBC
HCT: 30.9 — ABNORMAL LOW
Hemoglobin: 10.4 — ABNORMAL LOW
MCHC: 33.9
MCV: 84.1
Platelets: 327
RBC: 3.67 — ABNORMAL LOW
RDW: 13.7
WBC: 5.2

## 2011-01-18 LAB — APTT: aPTT: 28

## 2011-01-18 LAB — URINE MICROSCOPIC-ADD ON

## 2011-01-18 LAB — TYPE AND SCREEN
ABO/RH(D): O POS
Antibody Screen: NEGATIVE

## 2011-01-18 LAB — URINE CULTURE
Colony Count: NO GROWTH
Culture: NO GROWTH
Special Requests: NEGATIVE

## 2011-01-18 LAB — PROTIME-INR
INR: 0.9
Prothrombin Time: 12.8

## 2011-03-09 ENCOUNTER — Other Ambulatory Visit: Payer: Self-pay | Admitting: Internal Medicine

## 2011-03-09 DIAGNOSIS — Z1231 Encounter for screening mammogram for malignant neoplasm of breast: Secondary | ICD-10-CM

## 2011-03-16 ENCOUNTER — Ambulatory Visit
Admission: RE | Admit: 2011-03-16 | Discharge: 2011-03-16 | Disposition: A | Discharge status: Home / Self Care | Payer: PRIVATE HEALTH INSURANCE | Source: Ambulatory Visit | Attending: Internal Medicine | Admitting: Internal Medicine

## 2011-03-16 DIAGNOSIS — Z1231 Encounter for screening mammogram for malignant neoplasm of breast: Secondary | ICD-10-CM

## 2012-02-03 ENCOUNTER — Other Ambulatory Visit: Payer: Self-pay | Admitting: Internal Medicine

## 2012-02-03 DIAGNOSIS — Z1231 Encounter for screening mammogram for malignant neoplasm of breast: Secondary | ICD-10-CM

## 2012-04-09 ENCOUNTER — Ambulatory Visit
Admission: RE | Admit: 2012-04-09 | Discharge: 2012-04-09 | Disposition: A | Discharge status: Home / Self Care | Payer: PRIVATE HEALTH INSURANCE | Source: Ambulatory Visit | Attending: Internal Medicine | Admitting: Internal Medicine

## 2012-04-09 DIAGNOSIS — Z1231 Encounter for screening mammogram for malignant neoplasm of breast: Secondary | ICD-10-CM

## 2012-04-11 HISTORY — PX: CATARACT EXTRACTION W/ INTRAOCULAR LENS  IMPLANT, BILATERAL: SHX1307

## 2012-06-15 ENCOUNTER — Emergency Department (HOSPITAL_COMMUNITY)
Admission: EM | Admit: 2012-06-15 | Discharge: 2012-06-15 | Disposition: A | Discharge status: Home / Self Care | Payer: PRIVATE HEALTH INSURANCE | Attending: Emergency Medicine | Admitting: Emergency Medicine

## 2012-06-15 ENCOUNTER — Encounter (HOSPITAL_COMMUNITY): Payer: Self-pay

## 2012-06-15 DIAGNOSIS — H1130 Conjunctival hemorrhage, unspecified eye: Secondary | ICD-10-CM | POA: Insufficient documentation

## 2012-06-15 DIAGNOSIS — H1132 Conjunctival hemorrhage, left eye: Secondary | ICD-10-CM

## 2012-06-15 DIAGNOSIS — Z79899 Other long term (current) drug therapy: Secondary | ICD-10-CM | POA: Insufficient documentation

## 2012-06-15 DIAGNOSIS — M109 Gout, unspecified: Secondary | ICD-10-CM

## 2012-06-15 DIAGNOSIS — Z87442 Personal history of urinary calculi: Secondary | ICD-10-CM | POA: Insufficient documentation

## 2012-06-15 DIAGNOSIS — H5789 Other specified disorders of eye and adnexa: Secondary | ICD-10-CM | POA: Insufficient documentation

## 2012-06-15 DIAGNOSIS — Z7982 Long term (current) use of aspirin: Secondary | ICD-10-CM | POA: Insufficient documentation

## 2012-06-15 DIAGNOSIS — E119 Type 2 diabetes mellitus without complications: Secondary | ICD-10-CM | POA: Insufficient documentation

## 2012-06-15 DIAGNOSIS — I10 Essential (primary) hypertension: Secondary | ICD-10-CM | POA: Insufficient documentation

## 2012-06-15 HISTORY — DX: Essential (primary) hypertension: I10

## 2012-06-15 MED ORDER — OXYCODONE-ACETAMINOPHEN 5-325 MG PO TABS: 1.0000 | ORAL_TABLET | ORAL | Status: DC | PRN

## 2012-06-15 MED ORDER — OXYCODONE-ACETAMINOPHEN 5-325 MG PO TABS
2.0000 | ORAL_TABLET | Freq: Once | ORAL | Status: AC
  Administered 2012-06-15: 2 via ORAL
  Filled 2012-06-15: qty 2

## 2012-06-15 MED ORDER — COLCHICINE 0.6 MG PO TABS
1.2000 mg | ORAL_TABLET | ORAL | Status: AC
  Administered 2012-06-15: 1.2 mg via ORAL
  Filled 2012-06-15: qty 2

## 2012-06-15 MED ORDER — COLCHICINE 0.6 MG PO TABS: ORAL_TABLET | ORAL | Status: DC

## 2012-06-15 MED ORDER — INDOMETHACIN 25 MG PO CAPS: 25.0000 mg | ORAL_CAPSULE | Freq: Three times a day (TID) | ORAL | Status: DC

## 2012-06-15 NOTE — ED Notes (Addendum)
Pt. Denies any injury , woke up  With lt. Foot pain,  Increased pain when she applies pressure. Lt. Foot is slightly swollen.  Pt. Reports pain radiates into her lt. Calf.

## 2012-06-15 NOTE — ED Provider Notes (Signed)
History     CSN: [id number]  Arrival date & time 06/15/12  1651   First MD Initiated Contact with Patient 06/15/12 1723      Chief Complaint  Patient presents with  . Foot Pain    (Consider location/radiation/quality/duration/timing/severity/associated sxs/prior treatment) HPI Comments: 77 year old female with a history of high blood pressure and diabetes who presents with a complaint of left foot pain. She states that she awoke with pain in her left great toe 2 days ago, the pain has been persistent, severe, worse with touching the toe and ambulation and associated with redness of the skin. She denies fevers chills nausea vomiting and there is no spreading of the redness or swelling upper leg. She does have a history of gouty arthritis attack in the past, she does not take preventative medications, does not drink alcohol, does eat a small amount of red meat.   the patient also states that she's had some redness in her left eye for the last 3 days. This is not painful, has not changed her vision, does not cause watering tearing discharge or itching.  Patient is a 77 y.o. female presenting with lower extremity pain. The history is provided by the patient and a relative.  Foot Pain    Past Medical History  Diagnosis Date  . Hypertension   . Diabetes mellitus without complication   . Renal disorder     kidney stones    Past Surgical History  Procedure Laterality Date  . Abdominal hysterectomy      No family history on file.  History  Substance Use Topics  . Smoking status: Never Smoker   . Smokeless tobacco: Not on file  . Alcohol Use: No    OB History   Grav Para Term Preterm Abortions TAB SAB Ect Mult Living                  Review of Systems  Musculoskeletal: Positive for joint swelling.  Skin: Positive for color change.    Allergies  Codeine  Home Medications   Current Outpatient Rx  Name  Route  Sig  Dispense  Refill  . amLODipine (NORVASC) 10 MG  tablet   Oral   Take 10 mg by mouth daily.         [name] aspirin 81 MG tablet   Oral   Take 81 mg by mouth daily.         . bimatoprost (LUMIGAN) 0.01 % SOLN   Ophthalmic   Apply 1 drop to eye at bedtime.         . carvedilol (COREG) 12.5 MG tablet   Oral   Take 12.5 mg by mouth 2 (two) times daily with a meal.         . glimepiride (AMARYL) 1 MG tablet   Oral   Take 1 mg by mouth daily before breakfast.         . simvastatin (ZOCOR) 20 MG tablet   Oral   Take 20 mg by mouth every evening.         . valsartan-hydrochlorothiazide (DIOVAN-HCT) 320-25 MG per tablet   Oral   Take 1 tablet by mouth daily.         . colchicine 0.6 MG tablet      Take 0.6mg  (one tablet) by mouth every 1-2 hours until one of the following occurs: 1.  The pain is gone 2.  The maximum dose has been given ( no more than 3 tabs in 3  hours or 10 tabs in 24 hours) 3.  The side effects outweight the benefits   10 tablet   0   . indomethacin (INDOCIN) 25 MG capsule   Oral   Take 1 capsule (25 mg total) by mouth 3 (three) times daily with meals. May take up to 50mg  three times a day if no improvement with 25mg .   30 capsule   0   . oxyCODONE-acetaminophen (PERCOCET) 5-325 MG per tablet   Oral   Take 1 tablet by mouth every 4 (four) hours as needed for pain.   20 tablet   0     BP 152/61  Pulse 83  Temp(Src) 97.8 F (36.6 C) (Oral)  Resp 18  Ht 5\' 1"  (1.549 m)  Wt 198 lb (89.812 kg)  BMI 37.43 kg/m2  SpO2 98%  Physical Exam  Nursing note and vitals reviewed. Constitutional: She appears well-developed and well-nourished. No distress.  HENT:  Head: Normocephalic and atraumatic.  Eyes: EOM are normal. Pupils are equal, round, and reactive to light. Right eye exhibits no discharge. Left eye exhibits no discharge. No scleral icterus.  Some conjunctival hemorrhage on the left  Cardiovascular: Normal rate, regular rhythm and intact distal pulses.   Pulmonary/Chest: Effort normal  and breath sounds normal.  Musculoskeletal: She exhibits tenderness ( Tenderness to palpation at the base of the left great toe with painful range of motion, mild tenderness of the mid foot around that toe. Normal-appearing left ankle, right lower extremity normal, all other major joints normal.). She exhibits no edema.  Neurological: She is alert.  Skin: Skin is warm and dry. No rash noted. She is not diaphoretic.    ED Course  Procedures (including critical care time)  Labs Reviewed - No data to display No results found.   1. Acute gouty arthritis   2. Podagra   3. Subconjunctival hemorrhage, left       MDM  Signs and symptoms consistent with acute gouty arthritis and a  . Culture seen and Percocet ordered, patient stable for discharge and followup with family doctor to discuss preventative therapies for gout attacks in the future. She has a benign subconjunctival hemorrhage of the left, no visual changes, benign in appearance, can followup with ophthalmology family doctor.  Colchicine Indomethacin Percocet        Johnna Acosta, MD 06/15/12 1758

## 2012-06-15 NOTE — ED Notes (Signed)
Pt given discharge paperwork; pt verbalized understanding of d/c and f/u; no additional questions;

## 2012-06-15 NOTE — ED Notes (Signed)
Pt states she is also having some pain to left calf and leg; pt denies this pain being related to foot pain; states this pain started before;

## 2012-06-15 NOTE — Progress Notes (Signed)
Orthopedic Tech Progress Note Patient Details:  Doris Howell Mar 10, 1936 [id number]  Ortho Devices Type of Ortho Device: Crutches Ortho Device/Splint Interventions: Application   Hildred Priest 06/15/2012, 6:10 PM

## 2012-06-15 NOTE — ED Notes (Signed)
Pt given [place] sandwich per MD request;

## 2012-08-28 ENCOUNTER — Other Ambulatory Visit: Payer: Self-pay | Admitting: Gastroenterology

## 2012-10-05 ENCOUNTER — Encounter (HOSPITAL_COMMUNITY): Payer: Self-pay | Admitting: *Deleted

## 2012-10-05 HISTORY — PX: KIDNEY STONE SURGERY: SHX686

## 2012-10-16 ENCOUNTER — Encounter (HOSPITAL_COMMUNITY): Payer: Self-pay | Admitting: Pharmacy Technician

## 2012-10-23 ENCOUNTER — Ambulatory Visit (HOSPITAL_COMMUNITY): Payer: Medicare Other | Admitting: Anesthesiology

## 2012-10-23 ENCOUNTER — Encounter (HOSPITAL_COMMUNITY): Payer: Self-pay | Admitting: Anesthesiology

## 2012-10-23 ENCOUNTER — Encounter (HOSPITAL_COMMUNITY)
Admission: RE | Disposition: A | Discharge status: Home / Self Care | Payer: Self-pay | Source: Ambulatory Visit | Attending: Gastroenterology

## 2012-10-23 ENCOUNTER — Ambulatory Visit (HOSPITAL_COMMUNITY)
Admission: RE | Admit: 2012-10-23 | Discharge: 2012-10-23 | Disposition: A | Discharge status: Home / Self Care | Payer: Medicare Other | Source: Ambulatory Visit | Attending: Gastroenterology | Admitting: Gastroenterology

## 2012-10-23 ENCOUNTER — Encounter (HOSPITAL_COMMUNITY): Payer: Self-pay

## 2012-10-23 DIAGNOSIS — D126 Benign neoplasm of colon, unspecified: Secondary | ICD-10-CM | POA: Insufficient documentation

## 2012-10-23 DIAGNOSIS — N189 Chronic kidney disease, unspecified: Secondary | ICD-10-CM | POA: Insufficient documentation

## 2012-10-23 DIAGNOSIS — E039 Hypothyroidism, unspecified: Secondary | ICD-10-CM | POA: Insufficient documentation

## 2012-10-23 DIAGNOSIS — E119 Type 2 diabetes mellitus without complications: Secondary | ICD-10-CM | POA: Insufficient documentation

## 2012-10-23 DIAGNOSIS — I129 Hypertensive chronic kidney disease with stage 1 through stage 4 chronic kidney disease, or unspecified chronic kidney disease: Secondary | ICD-10-CM | POA: Insufficient documentation

## 2012-10-23 DIAGNOSIS — K219 Gastro-esophageal reflux disease without esophagitis: Secondary | ICD-10-CM | POA: Insufficient documentation

## 2012-10-23 HISTORY — DX: Calculus of kidney: N20.0

## 2012-10-23 HISTORY — PX: COLONOSCOPY WITH PROPOFOL: SHX5780

## 2012-10-23 LAB — GLUCOSE, CAPILLARY: Glucose-Capillary: 107 mg/dL — ABNORMAL HIGH (ref 70–99)

## 2012-10-23 SURGERY — COLONOSCOPY WITH PROPOFOL
Anesthesia: Monitor Anesthesia Care

## 2012-10-23 MED ORDER — SODIUM CHLORIDE 0.9 % IV SOLN: INTRAVENOUS | Status: DC

## 2012-10-23 MED ORDER — MIDAZOLAM HCL 5 MG/5ML IJ SOLN
INTRAMUSCULAR | Status: DC | PRN
  Administered 2012-10-23: 2 mg via INTRAVENOUS

## 2012-10-23 MED ORDER — PROPOFOL INFUSION 10 MG/ML OPTIME
INTRAVENOUS | Status: DC | PRN
  Administered 2012-10-23: 70 ug/kg/min via INTRAVENOUS

## 2012-10-23 MED ORDER — LACTATED RINGERS IV SOLN
INTRAVENOUS | Status: DC | PRN
  Administered 2012-10-23: 11:00:00 via INTRAVENOUS

## 2012-10-23 MED ORDER — KETAMINE HCL 10 MG/ML IJ SOLN
INTRAMUSCULAR | Status: DC | PRN
  Administered 2012-10-23: 20 mg via INTRAVENOUS

## 2012-10-23 SURGICAL SUPPLY — 22 items

## 2012-10-23 NOTE — Op Note (Signed)
Procedure: Surveillance colonoscopy. History of adenomatous colon polyps removed colonoscopically in the past.  Endoscopist: Earle Gell  Premedication: Propofol administered by anesthesia  Procedure: The patient was placed in the left lateral decubitus position. Anal inspection and digital rectal exam were normal. The Pentax pediatric colonoscope was introduced into the rectum and easily advanced to the cecum. A normal-appearing ileocecal valve and appendiceal orifice were identified. Colonic preparation for the exam today was good.  Rectum. Normal. Retroflex view of the distal rectum normal.  Sigmoid colon and descending colon. Normal.  Splenic flexure. Normal.  Transverse colon. From the distal transverse colon a 5 mm sessile polyp was removed with the cold snare. From the proximal transverse colon a 3 mm sessile polyp was removed with the cold biopsy forceps. Both polyps were submitted in one bottle for pathological evaluation.  Hepatic flexure. Normal.  Ascending colon. Normal.  Cecum and ileocecal valve. Normal.  Assessment: A diminutive polyp was removed from the proximal transverse colon a small polyp was removed from the distal transverse colon. Otherwise normal surveillance proctocolonoscopy to the cecum.  Recommendations: The patient probably does not require further surveillance colonoscopies; await pathology to confirm my recommendation

## 2012-10-23 NOTE — Anesthesia Preprocedure Evaluation (Addendum)
Anesthesia Evaluation  Patient identified by MRN, date of birth, ID band Patient awake    Reviewed: Allergy & Precautions, H&P , NPO status , Patient's Chart, lab work & pertinent test results  Airway Mallampati: III TM Distance: >3 FB Neck ROM: Full    Dental  (+) Poor Dentition, Edentulous Upper, Upper Dentures and Partial Lower   Pulmonary neg pulmonary ROS,  breath sounds clear to auscultation  Pulmonary exam normal       Cardiovascular hypertension, Pt. on medications negative cardio ROS  Rhythm:Regular Rate:Normal     Neuro/Psych negative neurological ROS  negative psych ROS   GI/Hepatic negative GI ROS, Neg liver ROS,   Endo/Other  negative endocrine ROSdiabetes, Type 2  Renal/GU Renal diseasenegative Renal ROS  negative genitourinary   Musculoskeletal negative musculoskeletal ROS (+)   Abdominal   Peds negative pediatric ROS (+)  Hematology negative hematology ROS (+)   Anesthesia Other Findings   Reproductive/Obstetrics                          Anesthesia Physical Anesthesia Plan  ASA: II  Anesthesia Plan: MAC   Post-op Pain Management:    Induction: Intravenous  Airway Management Planned: Simple Face Mask  Additional Equipment:   Intra-op Plan:   Post-operative Plan:   Informed Consent: I have reviewed the patients History and Physical, chart, labs and discussed the procedure including the risks, benefits and alternatives for the proposed anesthesia with the patient or authorized representative who has indicated his/her understanding and acceptance.   Dental advisory given  Plan Discussed with: CRNA  Anesthesia Plan Comments:         Anesthesia Quick Evaluation

## 2012-10-23 NOTE — Transfer of Care (Signed)
Immediate Anesthesia Transfer of Care Note  Patient: Doris Howell  Procedure(s) Performed: Procedure(s): COLONOSCOPY WITH PROPOFOL (N/A)  Patient Location: PACU  Anesthesia Type:MAC  Level of Consciousness: sedated  Airway & Oxygen Therapy: Patient Spontanous Breathing and Patient connected to nasal cannula oxygen  Post-op Assessment: Report given to PACU RN and Post -op Vital signs reviewed and stable  Post vital signs: Reviewed and stable  Complications: No apparent anesthesia complications

## 2012-10-23 NOTE — Anesthesia Postprocedure Evaluation (Signed)
Anesthesia Post Note  Patient: Doris Howell  Procedure(s) Performed: Procedure(s) (LRB): COLONOSCOPY WITH PROPOFOL (N/A)  Anesthesia type: MAC  Patient location: PACU  Post pain: Pain level controlled  Post assessment: Post-op Vital signs reviewed  Last Vitals:  Filed Vitals:   10/23/12 1235  BP: 127/60  Temp:   Resp: 18    Post vital signs: Reviewed  Level of consciousness: sedated  Complications: No apparent anesthesia complications

## 2012-10-23 NOTE — H&P (Signed)
  Procedure: Surveillance colonoscopy. History of adenomatous colon polyps removed in the past.  History: The patient is a 77 year old female born [birth year]. The patient has undergone colonoscopic exams in the past to remove adenomatous colon polyps. She is scheduled to undergo a surveillance colonoscopy today.  Medication allergies: Codeine causes throat tightness.  Chronic medications: Glimepiride. Valsartan. Hydrochlorothiazide. Amlodipine. Carvedilol.Colcrys. Allopurinol. Simvastatin. Aspirin. Lumigan eyedrops. Vitamin D.  Past medical and surgical history: Kidney stone surgery. Total abdominal hysterectomy. Bilateral salpingo-oophorectomy. Hypertension. Type 2 diabetes mellitus with proteinuria. Chronic kidney disease. Gastroesophageal reflux. Hypothyroidism. Kidney stones. Adenomatous colon polyps removed colonoscopically.  Habits: The patient has never smoked cigarettes. She does not consume alcohol.  Exam: The patient is alert and lying comfortably on the endoscopy stretcher. Abdomen is soft and nontender to palpation. Lungs are clear to auscultation. Cardiac exam reveals a regular rhythm.  Plan: Proceed with surveillance colonoscopy.

## 2012-10-24 ENCOUNTER — Encounter (HOSPITAL_COMMUNITY): Payer: Self-pay | Admitting: Gastroenterology

## 2013-05-24 ENCOUNTER — Other Ambulatory Visit: Payer: Self-pay

## 2013-05-24 DIAGNOSIS — Z1231 Encounter for screening mammogram for malignant neoplasm of breast: Secondary | ICD-10-CM

## 2013-06-06 ENCOUNTER — Ambulatory Visit: Payer: PRIVATE HEALTH INSURANCE

## 2013-07-03 ENCOUNTER — Ambulatory Visit
Admission: RE | Admit: 2013-07-03 | Discharge: 2013-07-03 | Disposition: A | Discharge status: Home / Self Care | Payer: Medicare Other | Source: Ambulatory Visit | Attending: Internal Medicine | Admitting: Internal Medicine

## 2013-07-03 ENCOUNTER — Other Ambulatory Visit: Payer: Self-pay | Admitting: Internal Medicine

## 2013-07-03 DIAGNOSIS — R079 Chest pain, unspecified: Secondary | ICD-10-CM

## 2013-07-09 ENCOUNTER — Ambulatory Visit
Admission: RE | Admit: 2013-07-09 | Discharge: 2013-07-09 | Disposition: A | Discharge status: Home / Self Care | Payer: Medicare Other | Source: Ambulatory Visit

## 2013-07-09 DIAGNOSIS — Z1231 Encounter for screening mammogram for malignant neoplasm of breast: Secondary | ICD-10-CM

## 2013-07-11 ENCOUNTER — Other Ambulatory Visit: Payer: Self-pay | Admitting: Internal Medicine

## 2013-07-11 DIAGNOSIS — R1011 Right upper quadrant pain: Secondary | ICD-10-CM

## 2013-07-11 DIAGNOSIS — R945 Abnormal results of liver function studies: Secondary | ICD-10-CM

## 2013-07-11 DIAGNOSIS — R7989 Other specified abnormal findings of blood chemistry: Secondary | ICD-10-CM

## 2013-07-17 ENCOUNTER — Ambulatory Visit
Admission: RE | Admit: 2013-07-17 | Discharge: 2013-07-17 | Disposition: A | Discharge status: Home / Self Care | Payer: Medicare Other | Source: Ambulatory Visit | Attending: Internal Medicine | Admitting: Internal Medicine

## 2013-07-17 DIAGNOSIS — R7989 Other specified abnormal findings of blood chemistry: Secondary | ICD-10-CM

## 2013-07-17 DIAGNOSIS — R945 Abnormal results of liver function studies: Secondary | ICD-10-CM

## 2013-07-17 DIAGNOSIS — R1011 Right upper quadrant pain: Secondary | ICD-10-CM

## 2013-07-23 ENCOUNTER — Other Ambulatory Visit: Payer: Self-pay | Admitting: Internal Medicine

## 2013-07-23 DIAGNOSIS — K769 Liver disease, unspecified: Secondary | ICD-10-CM

## 2013-07-30 ENCOUNTER — Inpatient Hospital Stay: Admission: RE | Admit: 2013-07-30 | Payer: Medicare Other | Source: Ambulatory Visit

## 2013-10-21 ENCOUNTER — Other Ambulatory Visit: Payer: Self-pay | Admitting: Internal Medicine

## 2013-10-21 ENCOUNTER — Inpatient Hospital Stay: Admission: RE | Admit: 2013-10-21 | Payer: Medicare Other | Source: Ambulatory Visit

## 2013-10-21 DIAGNOSIS — K769 Liver disease, unspecified: Secondary | ICD-10-CM

## 2013-10-29 ENCOUNTER — Ambulatory Visit
Admission: RE | Admit: 2013-10-29 | Discharge: 2013-10-29 | Disposition: A | Discharge status: Home / Self Care | Payer: Medicare Other | Source: Ambulatory Visit | Attending: Internal Medicine | Admitting: Internal Medicine

## 2013-10-29 DIAGNOSIS — K769 Liver disease, unspecified: Secondary | ICD-10-CM

## 2013-10-29 MED ORDER — GADOBENATE DIMEGLUMINE 529 MG/ML IV SOLN
9.0000 mL | Freq: Once | INTRAVENOUS | Status: AC | PRN
  Administered 2013-10-29: 9 mL via INTRAVENOUS

## 2014-01-09 ENCOUNTER — Emergency Department (HOSPITAL_COMMUNITY): Payer: Medicare Other

## 2014-01-09 ENCOUNTER — Inpatient Hospital Stay (HOSPITAL_COMMUNITY)
Admission: EM | Admit: 2014-01-09 | Discharge: 2014-01-13 | DRG: 193 | Disposition: A | Discharge status: Home / Self Care | Payer: Medicare Other | Attending: Family Medicine | Admitting: Family Medicine

## 2014-01-09 ENCOUNTER — Encounter (HOSPITAL_COMMUNITY): Payer: Self-pay | Admitting: Emergency Medicine

## 2014-01-09 DIAGNOSIS — J189 Pneumonia, unspecified organism: Secondary | ICD-10-CM | POA: Diagnosis present

## 2014-01-09 DIAGNOSIS — Z905 Acquired absence of kidney: Secondary | ICD-10-CM | POA: Diagnosis present

## 2014-01-09 DIAGNOSIS — E1165 Type 2 diabetes mellitus with hyperglycemia: Secondary | ICD-10-CM | POA: Diagnosis present

## 2014-01-09 DIAGNOSIS — I10 Essential (primary) hypertension: Secondary | ICD-10-CM | POA: Diagnosis present

## 2014-01-09 DIAGNOSIS — D649 Anemia, unspecified: Secondary | ICD-10-CM | POA: Diagnosis present

## 2014-01-09 DIAGNOSIS — I509 Heart failure, unspecified: Secondary | ICD-10-CM | POA: Diagnosis present

## 2014-01-09 DIAGNOSIS — Z79899 Other long term (current) drug therapy: Secondary | ICD-10-CM

## 2014-01-09 DIAGNOSIS — Z7982 Long term (current) use of aspirin: Secondary | ICD-10-CM | POA: Diagnosis not present

## 2014-01-09 DIAGNOSIS — J9601 Acute respiratory failure with hypoxia: Secondary | ICD-10-CM | POA: Diagnosis present

## 2014-01-09 DIAGNOSIS — I5031 Acute diastolic (congestive) heart failure: Secondary | ICD-10-CM | POA: Diagnosis present

## 2014-01-09 DIAGNOSIS — N179 Acute kidney failure, unspecified: Secondary | ICD-10-CM | POA: Diagnosis present

## 2014-01-09 HISTORY — DX: Gastro-esophageal reflux disease without esophagitis: K21.9

## 2014-01-09 HISTORY — DX: Pneumonia, unspecified organism: J18.9

## 2014-01-09 HISTORY — DX: Pure hypercholesterolemia, unspecified: E78.00

## 2014-01-09 HISTORY — DX: Personal history of other diseases of the musculoskeletal system and connective tissue: Z87.39

## 2014-01-09 LAB — URINALYSIS, ROUTINE W REFLEX MICROSCOPIC
Bilirubin Urine: NEGATIVE
Glucose, UA: NEGATIVE mg/dL
Hgb urine dipstick: NEGATIVE
Ketones, ur: NEGATIVE mg/dL
Leukocytes, UA: NEGATIVE
Nitrite: NEGATIVE
Protein, ur: NEGATIVE mg/dL
Specific Gravity, Urine: 1.006 (ref 1.005–1.030)
Urobilinogen, UA: 0.2 mg/dL (ref 0.0–1.0)
pH: 5 (ref 5.0–8.0)

## 2014-01-09 LAB — BASIC METABOLIC PANEL
Anion gap: 14 (ref 5–15)
BUN: 42 mg/dL — ABNORMAL HIGH (ref 6–23)
CO2: 23 mEq/L (ref 19–32)
Calcium: 9.6 mg/dL (ref 8.4–10.5)
Chloride: 107 mEq/L (ref 96–112)
Creatinine, Ser: 1.92 mg/dL — ABNORMAL HIGH (ref 0.50–1.10)
GFR calc Af Amer: 28 mL/min — ABNORMAL LOW (ref >90)
GFR calc non Af Amer: 24 mL/min — ABNORMAL LOW (ref >90)
Glucose, Bld: 126 mg/dL — ABNORMAL HIGH (ref 70–99)
Potassium: 4.3 mEq/L (ref 3.7–5.3)
Sodium: 144 mEq/L (ref 137–147)

## 2014-01-09 LAB — GLUCOSE, CAPILLARY
Glucose-Capillary: 209 mg/dL — ABNORMAL HIGH (ref 70–99)
Glucose-Capillary: 91 mg/dL (ref 70–99)

## 2014-01-09 LAB — CBC
HCT: 29 % — ABNORMAL LOW (ref 36.0–46.0)
HCT: 29.1 % — ABNORMAL LOW (ref 36.0–46.0)
Hemoglobin: 9.4 g/dL — ABNORMAL LOW (ref 12.0–15.0)
Hemoglobin: 9.3 g/dL — ABNORMAL LOW (ref 12.0–15.0)
MCH: 29.4 pg (ref 26.0–34.0)
MCH: 28.9 pg (ref 26.0–34.0)
MCHC: 32.4 g/dL (ref 30.0–36.0)
MCHC: 32 g/dL (ref 30.0–36.0)
MCV: 90.6 fL (ref 78.0–100.0)
MCV: 90.4 fL (ref 78.0–100.0)
Platelets: 160 10*3/uL (ref 150–400)
Platelets: 160 10*3/uL (ref 150–400)
RBC: 3.2 MIL/uL — ABNORMAL LOW (ref 3.87–5.11)
RBC: 3.22 MIL/uL — ABNORMAL LOW (ref 3.87–5.11)
RDW: 14 % (ref 11.5–15.5)
RDW: 14 % (ref 11.5–15.5)
WBC: 8.8 10*3/uL (ref 4.0–10.5)
WBC: 8.9 10*3/uL (ref 4.0–10.5)

## 2014-01-09 LAB — I-STAT TROPONIN, ED: Troponin i, poc: 0.03 ng/mL (ref 0.00–0.08)

## 2014-01-09 LAB — CREATININE, SERUM
Creatinine, Ser: 2.01 mg/dL — ABNORMAL HIGH (ref 0.50–1.10)
GFR calc Af Amer: 27 mL/min — ABNORMAL LOW (ref >90)
GFR calc non Af Amer: 23 mL/min — ABNORMAL LOW (ref >90)

## 2014-01-09 LAB — TSH: TSH: 2.44 u[IU]/mL (ref 0.350–4.500)

## 2014-01-09 LAB — TROPONIN I: Troponin I: <0.3 ng/mL (ref <0.30)

## 2014-01-09 LAB — PRO B NATRIURETIC PEPTIDE: Pro B Natriuretic peptide (BNP): 2595 pg/mL — ABNORMAL HIGH (ref 0–450)

## 2014-01-09 MED ORDER — DEXTROSE 5 % IV SOLN
1.0000 g | Freq: Once | INTRAVENOUS | Status: AC
  Administered 2014-01-09: 1 g via INTRAVENOUS
  Filled 2014-01-09: qty 10

## 2014-01-09 MED ORDER — AZITHROMYCIN 500 MG IV SOLR: 500.0000 mg | INTRAVENOUS | Status: DC

## 2014-01-09 MED ORDER — DEXTROSE 5 % IV SOLN
500.0000 mg | INTRAVENOUS | Status: DC
  Filled 2014-01-09: qty 500

## 2014-01-09 MED ORDER — ASPIRIN EC 81 MG PO TBEC
81.0000 mg | DELAYED_RELEASE_TABLET | Freq: Every day | ORAL | Status: DC
  Administered 2014-01-10 – 2014-01-13 (×4): 81 mg via ORAL
  Filled 2014-01-09 (×4): qty 1

## 2014-01-09 MED ORDER — CARVEDILOL 12.5 MG PO TABS
12.5000 mg | ORAL_TABLET | Freq: Two times a day (BID) | ORAL | Status: DC
  Administered 2014-01-10 – 2014-01-13 (×7): 12.5 mg via ORAL
  Filled 2014-01-09 (×9): qty 1

## 2014-01-09 MED ORDER — FUROSEMIDE 10 MG/ML IJ SOLN
80.0000 mg | Freq: Once | INTRAMUSCULAR | Status: AC
  Administered 2014-01-09: 80 mg via INTRAVENOUS
  Filled 2014-01-09: qty 8

## 2014-01-09 MED ORDER — VITAMIN D3 25 MCG (1000 UNIT) PO TABS
1000.0000 [IU] | ORAL_TABLET | Freq: Every day | ORAL | Status: DC
  Administered 2014-01-10 – 2014-01-13 (×4): 1000 [IU] via ORAL
  Filled 2014-01-09 (×4): qty 1

## 2014-01-09 MED ORDER — DEXTROSE 5 % IV SOLN
500.0000 mg | Freq: Once | INTRAVENOUS | Status: AC
  Administered 2014-01-10: 500 mg via INTRAVENOUS
  Filled 2014-01-09: qty 500

## 2014-01-09 MED ORDER — AMLODIPINE BESYLATE 10 MG PO TABS
10.0000 mg | ORAL_TABLET | Freq: Every morning | ORAL | Status: DC
  Administered 2014-01-10 – 2014-01-13 (×4): 10 mg via ORAL
  Filled 2014-01-09 (×4): qty 1

## 2014-01-09 MED ORDER — SODIUM CHLORIDE 0.9 % IJ SOLN: 3.0000 mL | Freq: Two times a day (BID) | INTRAMUSCULAR | Status: DC

## 2014-01-09 MED ORDER — SODIUM CHLORIDE 0.9 % IJ SOLN
3.0000 mL | Freq: Two times a day (BID) | INTRAMUSCULAR | Status: DC
  Administered 2014-01-09 – 2014-01-12 (×7): 3 mL via INTRAVENOUS

## 2014-01-09 MED ORDER — SODIUM CHLORIDE 0.9 % IV SOLN: 250.0000 mL | INTRAVENOUS | Status: DC | PRN

## 2014-01-09 MED ORDER — DEXTROSE 5 % IV SOLN: 1.0000 g | INTRAVENOUS | Status: DC

## 2014-01-09 MED ORDER — SIMVASTATIN 20 MG PO TABS
20.0000 mg | ORAL_TABLET | Freq: Every evening | ORAL | Status: DC
  Administered 2014-01-09 – 2014-01-12 (×4): 20 mg via ORAL
  Filled 2014-01-09 (×5): qty 1

## 2014-01-09 MED ORDER — ALLOPURINOL 100 MG PO TABS
200.0000 mg | ORAL_TABLET | Freq: Every day | ORAL | Status: DC
  Administered 2014-01-10 – 2014-01-13 (×4): 200 mg via ORAL
  Filled 2014-01-09 (×4): qty 2

## 2014-01-09 MED ORDER — HEPARIN SODIUM (PORCINE) 5000 UNIT/ML IJ SOLN
5000.0000 [IU] | Freq: Three times a day (TID) | INTRAMUSCULAR | Status: DC
  Administered 2014-01-09 – 2014-01-13 (×11): 5000 [IU] via SUBCUTANEOUS
  Filled 2014-01-09 (×12): qty 1

## 2014-01-09 MED ORDER — INSULIN ASPART 100 UNIT/ML ~~LOC~~ SOLN
0.0000 [IU] | SUBCUTANEOUS | Status: DC
  Administered 2014-01-10 (×2): 3 [IU] via SUBCUTANEOUS
  Administered 2014-01-10 – 2014-01-11 (×4): 1 [IU] via SUBCUTANEOUS
  Administered 2014-01-11 – 2014-01-12 (×2): 2 [IU] via SUBCUTANEOUS
  Administered 2014-01-12: 3 [IU] via SUBCUTANEOUS
  Administered 2014-01-12 – 2014-01-13 (×3): 2 [IU] via SUBCUTANEOUS
  Administered 2014-01-13: 1 [IU] via SUBCUTANEOUS

## 2014-01-09 MED ORDER — DEXTROSE 5 % IV SOLN
1.0000 g | INTRAVENOUS | Status: DC
  Administered 2014-01-10 – 2014-01-12 (×3): 1 g via INTRAVENOUS
  Filled 2014-01-09 (×4): qty 10

## 2014-01-09 MED ORDER — SODIUM CHLORIDE 0.9 % IJ SOLN
3.0000 mL | INTRAMUSCULAR | Status: DC | PRN
Start: 2014-01-09 — End: 2014-01-13

## 2014-01-09 NOTE — H&P (Addendum)
Hospitalist Admission History and Physical  Patient name: Doris Howell Medical record number: [id number] Date of birth: March 08, 1936 Age: 78 y.o. Gender: female  Primary Care Provider: Irven Shelling, MD  Chief Complaint: acute resp failure w/ hypoxia, CAP, CHF exacerbation  History of Present Illness:This is a 78 y.o. year old female with significant past medical history of hypertension, presenting with acute respiratory failure with hypoxia. Patient/family report patient with increased work breathing over the past 2 days. Positive subjective orthopnea PND home. Patient states that she was seen by her PCP about one week ago for followup. He is known to be currently on Lasix for what is presumed to be peripheral edema. Stasis she was told to stop taking the medication because of worsening renal function. States that she has chronic lower extremity edema that has been fairly stable since stopping the medications. Denies any increased salt intake or recent NSAID use. Nonsmoker. No history of pneumonia. No chest pain. The patient went to go see her PCP about symptoms earlier today. Was reported to have been the setting into the mid 80s there and was directed to the ER for further evaluation to Presented to ER temperature 98.9, heart rate 70s to 80s, respirations tends to 30s, blood pressure 130s to 140s, 7 around 95% on 2 L. While blood cell count 8.8, hemoglobin 9.4, creatinine 1.92, BUN 42. Pro BNP 2600. Chest x-ray obtained shows bilateral patchy infiltrates suspicious for pneumonia with stable mild changes of COPD and mild cardiomegaly. Patient started on Rocephin and azithromycin for community-acquired pneumonia coverage. Blood cultures drawn prior to antibiotic being administered. Also received 80 mg of IV Lasix with 200 cc of urine output so far.  Assessment and Plan: Doris Howell is a 78 y.o. year old female presenting with Acute resp failure w/ hypoxia   Active Problems:    CAP (community acquired pneumonia)   Acute respiratory failure with hypoxia   Anemia   AKI (acute kidney injury)   1-Acute resp failure with hypoxia -Likely multifactorial contributions of community-acquired pneumonia and CHF -Unclear if pt has true COPD in light of CXR findings (nonsmoker-no wheezing-no hx/o asthma) -Continue with Rocephin and azithromycin for community-acquired pneumonia coverage -Pan culture, urine strep and Legionella -Elevated proBNP without baseline for comparison -2-D echocardiogram -Telemetry bed -Strict ins and outs and daily weights -Status post 80 mg of IV Lasix in the ER -Follow urine output overnight as to avoid overdiuresis given AK I   2-AKI -Suspect prerenal etiology the setting of diuretic and ARB use -BUN/creatinine ratios around 20-1 -Gently diurese given above -Hold nephrotoxic agents -Check urine sodium and urine creatinine -Consider renal ultrasound/?renal consult if renal function fails to improve  3-anemia -Hemoglobin 9.4 presentation -Hemoglobin 10.4 about 6 years ago -No overt signs of bleeding currently -Normocytic MCV -Check anemia panel  4-HTN  -BP stable  -continue home regimen  -hold nephrotoxic agents  5-DM  -SSI, A1C  -hold orals    FEN/GI: heart healthy, low sodium diet  Prophylaxis: sub q heparin  Disposition: pending further evaluation Code Status:Full Code    Patient Active Problem List   Diagnosis Date Noted  . CAP (community acquired pneumonia) 01/09/2014  . Acute respiratory failure with hypoxia 01/09/2014   Past Medical History: Past Medical History  Diagnosis Date  . Hypertension   . Diabetes mellitus without complication   . Kidney stones 10-05-12    history  . Cataract 10-05-12    bilateral , rt. eye cataract surgery 10-08-12  Past Surgical History: Past Surgical History  Procedure Laterality Date  . Abdominal hysterectomy    . Kidney stone surgery  10-05-12    x2 surgeries  . Colonoscopy  with propofol N/A 10/23/2012    Procedure: COLONOSCOPY WITH PROPOFOL;  Surgeon: [name], MD;  Location: [facility];  Service: Endoscopy;  Laterality: N/A;    Social History: History   Social History  . Marital Status: Married    Spouse Name: N/A    Number of Children: N/A  . Years of Education: N/A   Social History Main Topics  . Smoking status: Never Smoker   . Smokeless tobacco: None  . Alcohol Use: No  . Drug Use: No  . Sexual Activity: Yes   Other Topics Concern  . None   Social History Narrative  . None    Family History: History reviewed. No pertinent family history.  Allergies: Allergies  Allergen Reactions  . Codeine Other (See Comments)    Unknown; pt can't remember. It was in the '70s    Current Facility-Administered Medications  Medication Dose Route Frequency Provider Last Rate Last Dose  . 0.9 %  sodium chloride infusion  250 mL Intravenous PRN Shanda Howells, MD      . allopurinol (ZYLOPRIM) tablet 200 mg  200 mg Oral Daily Shanda Howells, MD      . Derrill Memo ON 01/10/2014] amLODipine (NORVASC) tablet 10 mg  10 mg Oral q morning - 10a Shanda Howells, MD      . aspirin EC tablet 81 mg  81 mg Oral Daily Shanda Howells, MD      . azithromycin (ZITHROMAX) 500 mg in dextrose 5 % 250 mL IVPB  500 mg Intravenous Once Orpah Greek, MD      . Derrill Memo ON 01/10/2014] carvedilol (COREG) tablet 12.5 mg  12.5 mg Oral BID WC Shanda Howells, MD      . cefTRIAXone (ROCEPHIN) 1 g in dextrose 5 % 50 mL IVPB  1 g Intravenous Once Orpah Greek, MD 100 mL/hr at 01/09/14 1932 1 g at 01/09/14 1932  . cholecalciferol (VITAMIN D) tablet 1,000 Units  1,000 Units Oral Daily Shanda Howells, MD      . heparin injection 5,000 Units  5,000 Units Subcutaneous 3 times per day Shanda Howells, MD      . insulin aspart (novoLOG) injection 0-9 Units  0-9 Units Subcutaneous 6 times per day Shanda Howells, MD      . simvastatin (ZOCOR) tablet 20 mg  20 mg Oral QPM Shanda Howells, MD      . sodium chloride 0.9 % injection 3 mL  3 mL Intravenous Q12H Shanda Howells, MD      . sodium chloride 0.9 % injection 3 mL  3 mL Intravenous Q12H Shanda Howells, MD      . sodium chloride 0.9 % injection 3 mL  3 mL Intravenous PRN Shanda Howells, MD       Current Outpatient Prescriptions  Medication Sig Dispense Refill  . acetaminophen (TYLENOL) 500 MG tablet Take 500 mg by mouth every 6 (six) hours as needed for pain.      [name] allopurinol (ZYLOPRIM) 100 MG tablet Take 200 mg by mouth daily.      [name] amLODipine (NORVASC) 10 MG tablet Take 10 mg by mouth every morning.       [name] aspirin EC 81 MG tablet Take 81 mg by mouth daily.      . bimatoprost (LUMIGAN) 0.01 % SOLN Place 1  drop into both eyes at bedtime.       . carvedilol (COREG) 12.5 MG tablet Take 12.5 mg by mouth 2 (two) times daily with a meal.      . cholecalciferol (VITAMIN D) 1000 UNITS tablet Take 1,000 Units by mouth daily.      [name] glimepiride (AMARYL) 1 MG tablet Take 1 mg by mouth daily before breakfast.      . simvastatin (ZOCOR) 20 MG tablet Take 20 mg by mouth every evening.      . valsartan (DIOVAN) 320 MG tablet Take 320 mg by mouth daily.      . furosemide (LASIX) 80 MG tablet Take 40 mg by mouth daily.       Review Of Systems: 12 point ROS negative except as noted above in HPI.  Physical Exam: Filed Vitals:   01/09/14 1900  BP: 139/57  Pulse: 80  Temp:   Resp: 31    General: alert, cooperative and mild distress HEENT: PERRLA and extra ocular movement intact Heart: S1, S2 normal, no murmur, rub or gallop, regular rate and rhythm Lungs: mildly labored breathing, decreased breath sounds diffusely  Abdomen: abdomen is soft without significant tenderness, masses, organomegaly or guarding Extremities: extremities normal, atraumatic, no cyanosis or edema Skin:no rashes, no ecchymoses Neurology: normal without focal findings  Labs and Imaging: Lab Results  Component Value Date/Time   NA 144 01/09/2014   3:15 PM   K 4.3 01/09/2014  3:15 PM   CL 107 01/09/2014  3:15 PM   CO2 23 01/09/2014  3:15 PM   BUN 42* 01/09/2014  3:15 PM   CREATININE 1.92* 01/09/2014  3:15 PM   GLUCOSE 126* 01/09/2014  3:15 PM   Lab Results  Component Value Date   WBC 8.8 01/09/2014   HGB 9.4* 01/09/2014   HCT 29.0* 01/09/2014   MCV 90.6 01/09/2014   PLT 160 01/09/2014    Dg Chest 2 View  01/09/2014   CLINICAL DATA:  Persistent shortness of breath.  EXAM: CHEST  2 VIEW  COMPARISON:  07/03/2013.  FINDINGS: Mildly enlarged cardiac silhouette with a mild increase in size. Stable small amount of linear scarring on the left. Interval patchy opacity in the right lower lobe. Small amount of patchy opacity in the left lower lobe and right perihilar region. Stable mildly prominent interstitial markings, diffuse osteopenia and thoracic spine degenerative changes, including changes of DISH. The hemidiaphragms are flattened on the lateral view.  IMPRESSION: 1. Bilateral patchy opacity, as described above, suspicious for pneumonia. 2. Stable mild changes of COPD. 3. Mild cardiomegaly with mild progression   Electronically Signed   By: Enrique Sack M.D.   On: 01/09/2014 16:54           Shanda Howells MD  Pager: [phone]

## 2014-01-09 NOTE — Progress Notes (Signed)
Called Doretha Sou for report @ 1918.

## 2014-01-09 NOTE — ED Notes (Signed)
Dr. Newton at bedside. 

## 2014-01-09 NOTE — ED Provider Notes (Signed)
CSN: [id number]     Arrival date & time 01/09/14  1508 History   First MD Initiated Contact with Patient 01/09/14 1516     Chief Complaint  Patient presents with  . Shortness of Breath     (Consider location/radiation/quality/duration/timing/severity/associated sxs/prior Treatment) HPI Comments: Patient presents to the ER for evaluation of difficulty breathing. Patient tells me that her symptoms began yesterday. She has noticed progressively worsening shortness of breath since that time. She has not had any chest pain. There is no cough, congestion or fever.  Patient tells me that she had her Lasix stopped by her primary doctor approximately 2 weeks ago. At that time that he had her stop the Lasix he told her to drink lots of water which she did all last week.  Patient is a 78 y.o. female presenting with shortness of breath.  Shortness of Breath   Past Medical History  Diagnosis Date  . Hypertension   . Diabetes mellitus without complication   . Kidney stones 10-05-12    history  . Cataract 10-05-12    bilateral , rt. eye cataract surgery 10-08-12   Past Surgical History  Procedure Laterality Date  . Abdominal hysterectomy    . Kidney stone surgery  10-05-12    x2 surgeries  . Colonoscopy with propofol N/A 10/23/2012    Procedure: COLONOSCOPY WITH PROPOFOL;  Surgeon: [name], MD;  Location: [facility];  Service: Endoscopy;  Laterality: N/A;   History reviewed. No pertinent family history. History  Substance Use Topics  . Smoking status: Never Smoker   . Smokeless tobacco: Not on file  . Alcohol Use: No   OB History   Grav Para Term Preterm Abortions TAB SAB Ect Mult Living                 Review of Systems  Respiratory: Positive for shortness of breath.   All other systems reviewed and are negative.     Allergies  Codeine  Home Medications   Prior to Admission medications   Medication Sig Start Date End Date Taking? Authorizing Provider   acetaminophen (TYLENOL) 500 MG tablet Take 500 mg by mouth every 6 (six) hours as needed for pain.   Yes Historical Provider, MD  allopurinol (ZYLOPRIM) 100 MG tablet Take 200 mg by mouth daily.   Yes Historical Provider, MD  amLODipine (NORVASC) 10 MG tablet Take 10 mg by mouth every morning.    Yes Historical Provider, MD  aspirin EC 81 MG tablet Take 81 mg by mouth daily.   Yes Historical Provider, MD  bimatoprost (LUMIGAN) 0.01 % SOLN Place 1 drop into both eyes at bedtime.    Yes Historical Provider, MD  carvedilol (COREG) 12.5 MG tablet Take 12.5 mg by mouth 2 (two) times daily with a meal.   Yes Historical Provider, MD  cholecalciferol (VITAMIN D) 1000 UNITS tablet Take 1,000 Units by mouth daily.   Yes Historical Provider, MD  glimepiride (AMARYL) 1 MG tablet Take 1 mg by mouth daily before breakfast.   Yes Historical Provider, MD  simvastatin (ZOCOR) 20 MG tablet Take 20 mg by mouth every evening.   Yes Historical Provider, MD  valsartan (DIOVAN) 320 MG tablet Take 320 mg by mouth daily.   Yes Historical Provider, MD  furosemide (LASIX) 80 MG tablet Take 40 mg by mouth daily.    Historical Provider, MD   BP 143/59  Pulse 73  Temp(Src) 98.9 F (37.2 C) (Oral)  Resp 27  SpO2 97%  Physical Exam  Constitutional: She is oriented to person, place, and time. She appears well-developed and well-nourished. No distress.  HENT:  Head: Normocephalic and atraumatic.  Right Ear: Hearing normal.  Left Ear: Hearing normal.  Nose: Nose normal.  Mouth/Throat: Oropharynx is clear and moist and mucous membranes are normal.  Eyes: Conjunctivae and EOM are normal. Pupils are equal, round, and reactive to light.  Neck: Normal range of motion. Neck supple.  Cardiovascular: Regular rhythm, S1 normal and S2 normal.  Exam reveals no gallop and no friction rub.   No murmur heard. Pulmonary/Chest: Effort normal. No respiratory distress. She has rales. She exhibits no tenderness.  Abdominal: Soft. Normal  appearance and bowel sounds are normal. There is no hepatosplenomegaly. There is no tenderness. There is no rebound, no guarding, no tenderness at McBurney's point and negative Murphy's sign. No hernia.  Musculoskeletal: Normal range of motion. She exhibits edema.  Neurological: She is alert and oriented to person, place, and time. She has normal strength. No cranial nerve deficit or sensory deficit. Coordination normal. GCS eye subscore is 4. GCS verbal subscore is 5. GCS motor subscore is 6.  Skin: Skin is warm, dry and intact. No rash noted. No cyanosis.  Psychiatric: She has a normal mood and affect. Her speech is normal and behavior is normal. Thought content normal.    ED Course  Procedures (including critical care time) Labs Review Labs Reviewed  CBC - Abnormal; Notable for the following:    RBC 3.20 (*)    Hemoglobin 9.4 (*)    HCT 29.0 (*)    All other components within normal limits  BASIC METABOLIC PANEL - Abnormal; Notable for the following:    Glucose, Bld 126 (*)    BUN 42 (*)    Creatinine, Ser 1.92 (*)    GFR calc non Af Amer 24 (*)    GFR calc Af Amer 28 (*)    All other components within normal limits  PRO B NATRIURETIC PEPTIDE - Abnormal; Notable for the following:    Pro B Natriuretic peptide (BNP) 2595.0 (*)    All other components within normal limits  CULTURE, BLOOD (ROUTINE X 2)  CULTURE, BLOOD (ROUTINE X 2)  I-STAT TROPOININ, ED    Imaging Review Dg Chest 2 View  01/09/2014   CLINICAL DATA:  Persistent shortness of breath.  EXAM: CHEST  2 VIEW  COMPARISON:  07/03/2013.  FINDINGS: Mildly enlarged cardiac silhouette with a mild increase in size. Stable small amount of linear scarring on the left. Interval patchy opacity in the right lower lobe. Small amount of patchy opacity in the left lower lobe and right perihilar region. Stable mildly prominent interstitial markings, diffuse osteopenia and thoracic spine degenerative changes, including changes of DISH. The  hemidiaphragms are flattened on the lateral view.  IMPRESSION: 1. Bilateral patchy opacity, as described above, suspicious for pneumonia. 2. Stable mild changes of COPD. 3. Mild cardiomegaly with mild progression   Electronically Signed   By: Enrique Sack M.D.   On: 01/09/2014 16:54     EKG Interpretation   Date/Time:  Thursday January 09 2014 15:14:52 EDT Ventricular Rate:  79 PR Interval:  160 QRS Duration: 82 QT Interval:  412 QTC Calculation: 472 R Axis:   44 Text Interpretation:  Sinus rhythm with occasional Premature ventricular  complexes Nonspecific T wave abnormality Prolonged QT Abnormal ECG No  significant change since last tracing Confirmed by Dijuan Sleeth  MD,  Rece Zechman [id number]) on 01/09/2014 4:12:40 PM  MDM   Final diagnoses:  None   community acquired pneumonia  Possible CHF  Patient presents to the ER for evaluation of difficulty breathing which began yesterday. Patient has shortness of breath at rest and with exertion. It worsens when she lies down. Patient does have a history of taking Lasix, but tells me that her doctor took her off the Lasix 2 weeks ago. It sounds like she was having some renal insufficiency at that time. He told her to drink more water than usual after he stopped the Lasix and then to use the Lasix "as needed". She has not taken any since it was stopped.  To have some diffuse rales in the lower lung fields on examination. She has significant pitting edema of both lower extremities. Based on these findings and the recent history, decompensated congestive heart failure was considered. Patient was administered IV Lasix.  Patient has a nonischemic EKG. Troponin was negative. BNP is above 2000, for baseline for her. Reviewing the patient's records, I do not see a history of an echo.  Patient did have a chest x-ray performed which shows bilateral patchy opacities right greater than left. Doctor Joneen Caraway, the radiologist felt this was most consistent with  pneumonia. Patient will require treatment for community-acquired pneumonia as well as possible congestive heart failure. In addition to the already administered Lasix, patient was administered Rocephin and Zithromax IV.  Patient is not hypoxic here in the ER. She does, however, tachypneic. She had difficulty ambulating here in the ER because of feeling out of breath. She did not, however, dropped her oxygen saturation to 94%. Because she is so symptomatic with exertion, patient to be admitted to the hospital for further management.    Orpah Greek, MD 01/09/14 [phone]

## 2014-01-09 NOTE — ED Notes (Signed)
Sob since yesterday. No cp. Hx of chf.  Been off furosemide for a week.

## 2014-01-10 DIAGNOSIS — I369 Nonrheumatic tricuspid valve disorder, unspecified: Secondary | ICD-10-CM

## 2014-01-10 DIAGNOSIS — J9601 Acute respiratory failure with hypoxia: Secondary | ICD-10-CM

## 2014-01-10 LAB — CREATININE, URINE, RANDOM: Creatinine, Urine: 57.19 mg/dL

## 2014-01-10 LAB — LEGIONELLA ANTIGEN, URINE

## 2014-01-10 LAB — COMPREHENSIVE METABOLIC PANEL
ALT: 25 U/L (ref 0–35)
AST: 34 U/L (ref 0–37)
Albumin: 3.3 g/dL — ABNORMAL LOW (ref 3.5–5.2)
Alkaline Phosphatase: 87 U/L (ref 39–117)
Anion gap: 12 (ref 5–15)
BUN: 44 mg/dL — ABNORMAL HIGH (ref 6–23)
CO2: 26 mEq/L (ref 19–32)
Calcium: 9.5 mg/dL (ref 8.4–10.5)
Chloride: 102 mEq/L (ref 96–112)
Creatinine, Ser: 2.06 mg/dL — ABNORMAL HIGH (ref 0.50–1.10)
GFR calc Af Amer: 26 mL/min — ABNORMAL LOW (ref >90)
GFR calc non Af Amer: 22 mL/min — ABNORMAL LOW (ref >90)
Glucose, Bld: 161 mg/dL — ABNORMAL HIGH (ref 70–99)
Potassium: 3.9 mEq/L (ref 3.7–5.3)
Sodium: 140 mEq/L (ref 137–147)
Total Bilirubin: 0.4 mg/dL (ref 0.3–1.2)
Total Protein: 7.5 g/dL (ref 6.0–8.3)

## 2014-01-10 LAB — CBC WITH DIFFERENTIAL/PLATELET
Basophils Absolute: 0 10*3/uL (ref 0.0–0.1)
Basophils Relative: 0 % (ref 0–1)
Eosinophils Absolute: 0.1 10*3/uL (ref 0.0–0.7)
Eosinophils Relative: 1 % (ref 0–5)
HCT: 27.2 % — ABNORMAL LOW (ref 36.0–46.0)
Hemoglobin: 8.8 g/dL — ABNORMAL LOW (ref 12.0–15.0)
Lymphocytes Relative: 10 % — ABNORMAL LOW (ref 12–46)
Lymphs Abs: 0.8 10*3/uL (ref 0.7–4.0)
MCH: 29.3 pg (ref 26.0–34.0)
MCHC: 32.4 g/dL (ref 30.0–36.0)
MCV: 90.7 fL (ref 78.0–100.0)
Monocytes Absolute: 0.9 10*3/uL (ref 0.1–1.0)
Monocytes Relative: 12 % (ref 3–12)
Neutro Abs: 5.7 10*3/uL (ref 1.7–7.7)
Neutrophils Relative %: 77 % (ref 43–77)
Platelets: 152 10*3/uL (ref 150–400)
RBC: 3 MIL/uL — ABNORMAL LOW (ref 3.87–5.11)
RDW: 14.1 % (ref 11.5–15.5)
WBC: 7.5 10*3/uL (ref 4.0–10.5)

## 2014-01-10 LAB — GLUCOSE, CAPILLARY
Glucose-Capillary: 117 mg/dL — ABNORMAL HIGH (ref 70–99)
Glucose-Capillary: 120 mg/dL — ABNORMAL HIGH (ref 70–99)
Glucose-Capillary: 143 mg/dL — ABNORMAL HIGH (ref 70–99)
Glucose-Capillary: 144 mg/dL — ABNORMAL HIGH (ref 70–99)
Glucose-Capillary: 201 mg/dL — ABNORMAL HIGH (ref 70–99)
Glucose-Capillary: 40 mg/dL — CL (ref 70–99)
Glucose-Capillary: 63 mg/dL — ABNORMAL LOW (ref 70–99)
Glucose-Capillary: 65 mg/dL — ABNORMAL LOW (ref 70–99)
Glucose-Capillary: 69 mg/dL — ABNORMAL LOW (ref 70–99)

## 2014-01-10 LAB — HEMOGLOBIN A1C
Hgb A1c MFr Bld: 6.9 % — ABNORMAL HIGH (ref <5.7)
Mean Plasma Glucose: 151 mg/dL — ABNORMAL HIGH (ref <117)

## 2014-01-10 LAB — FERRITIN: Ferritin: 189 ng/mL (ref 10–291)

## 2014-01-10 LAB — SODIUM, URINE, RANDOM: Sodium, Ur: 101 mEq/L

## 2014-01-10 LAB — TROPONIN I
Troponin I: <0.3 ng/mL (ref <0.30)
Troponin I: <0.3 ng/mL (ref <0.30)

## 2014-01-10 LAB — FOLATE: Folate: 19.5 ng/mL

## 2014-01-10 LAB — STREP PNEUMONIAE URINARY ANTIGEN: Strep Pneumo Urinary Antigen: NEGATIVE

## 2014-01-10 LAB — RETICULOCYTES
RBC.: 2.9 MIL/uL — ABNORMAL LOW (ref 3.87–5.11)
Retic Count, Absolute: 49.3 10*3/uL (ref 19.0–186.0)
Retic Ct Pct: 1.7 % (ref 0.4–3.1)

## 2014-01-10 LAB — VITAMIN B12: Vitamin B-12: 705 pg/mL (ref 211–911)

## 2014-01-10 LAB — HIV ANTIBODY (ROUTINE TESTING W REFLEX): HIV 1&2 Ab, 4th Generation: NONREACTIVE

## 2014-01-10 MED ORDER — ACETAMINOPHEN 325 MG PO TABS
650.0000 mg | ORAL_TABLET | Freq: Four times a day (QID) | ORAL | Status: DC | PRN
  Administered 2014-01-10: 650 mg via ORAL
  Filled 2014-01-10: qty 2

## 2014-01-10 MED ORDER — LATANOPROST 0.005 % OP SOLN
1.0000 [drp] | Freq: Every day | OPHTHALMIC | Status: DC
  Administered 2014-01-11 – 2014-01-12 (×3): 1 [drp] via OPHTHALMIC
  Filled 2014-01-10: qty 2.5

## 2014-01-10 MED ORDER — CETYLPYRIDINIUM CHLORIDE 0.05 % MT LIQD
7.0000 mL | Freq: Two times a day (BID) | OROMUCOSAL | Status: DC
  Administered 2014-01-10 – 2014-01-12 (×7): 7 mL via OROMUCOSAL

## 2014-01-10 MED ORDER — DEXTROSE 5 % IV SOLN
500.0000 mg | INTRAVENOUS | Status: DC
  Filled 2014-01-10: qty 500

## 2014-01-10 MED ORDER — FUROSEMIDE 10 MG/ML IJ SOLN
40.0000 mg | Freq: Every day | INTRAMUSCULAR | Status: DC
  Administered 2014-01-10 – 2014-01-11 (×2): 40 mg via INTRAVENOUS
  Filled 2014-01-10 (×2): qty 4

## 2014-01-10 NOTE — Progress Notes (Signed)
NP notified concerning patient's b/p. Will continue to monitor

## 2014-01-10 NOTE — Progress Notes (Signed)
Hypoglycemic Event  CBG:63  Treatment: 15 GM carbohydrate snack  Symptoms: Hungry  Follow-up CBG: Time:0512 CBG Result:117  Possible Reasons for Event: Unknown  Comments/MD notified:MD on call NP [place]    Doris Howell  Remember to initiate Hypoglycemia Order Set & complete

## 2014-01-10 NOTE — Progress Notes (Signed)
Inpatient Diabetes Program Recommendations  AACE/ADA: New Consensus Statement on Inpatient Glycemic Control (2013)  Target Ranges:  Prepandial:   less than 140 mg/dL      Peak postprandial:   less than 180 mg/dL (1-2 hours)      Critically ill patients:  140 - 180 mg/dL    Inpatient Diabetes Program Recommendations Diet: Noted diet order changed to heart healthy. There is now an order for "heart healthy/carb mod" that would be helpful to control glucose levels without the need for correction. which potentially could cause the hypoglycema  (this In addition to any renal restrictions.)  Thank you, Rosita Kea, RN, CNS, Diabetes Coordinator [phone])

## 2014-01-10 NOTE — Progress Notes (Signed)
[facility] PROGRESS NOTE  Doris Howell [id number] DOB: Dec 14, 1935 DOA: 01/09/2014 PCP: Irven Shelling, MD  Assessment/Plan: Active Problems:   CAP (community acquired pneumonia) - Continue current antibiotic regimen    Acute respiratory failure with hypoxia - Multifactorial most likely combination of congestive heart failure (unknown if diastolic or systolic) as well as CAP. - Supplemental oxygen as needed - Will add Lasix 40 mg IV daily    Anemia -Stable no active bleeding    AKI (acute kidney injury) - Will continue to monitor serum creatinine - Consider further workup should serum creatinine and continue elevated or rise  Code Status: full Family Communication: d/c daughter and patient at bedside Disposition Plan: Pending improvement in condition   Consultants:  None  Procedures:  echocardiogram  Antibiotics:  Azithromycin  Ceftriaxone  HPI/Subjective: Pt has no new complaints. No acute issues reported overnight.  Objective: Filed Vitals:   01/10/14 1330  BP: 128/60  Pulse: 65  Temp: 99.3 F (37.4 C)  Resp: 24    Intake/Output Summary (Last 24 hours) at 01/10/14 1622 Last data filed at 01/10/14 1300  Gross per 24 hour  Intake     50 ml  Output   1570 ml  Net  -1520 ml   Filed Weights   01/09/14 2015 01/10/14 0453  Weight: 81.738 kg (180 lb 3.2 oz) 96.117 kg (211 lb 14.4 oz)    Exam:   General:  Pt in nad, alert and awake  Cardiovascular: rrr, no murmurs  Respiratory: decreased breath sounds at bases, no wheezes  Abdomen: soft, obese, NT  Musculoskeletal: no cyanosis   Data Reviewed: Basic Metabolic Panel:  Recent Labs Lab 01/09/14 1515 01/09/14 2155 01/10/14 0105  NA 144  --  140  K 4.3  --  3.9  CL 107  --  102  CO2 23  --  26  GLUCOSE 126*  --  161*  BUN 42*  --  44*  CREATININE 1.92* 2.01* 2.06*  CALCIUM 9.6  --  9.5   Liver Function Tests:  Recent Labs Lab 01/10/14 0105  AST 34  ALT 25   ALKPHOS 87  BILITOT 0.4  PROT 7.5  ALBUMIN 3.3*   No results found for this basename: LIPASE, AMYLASE,  in the last 168 hours No results found for this basename: AMMONIA,  in the last 168 hours CBC:  Recent Labs Lab 01/09/14 1515 01/09/14 2155 01/10/14 0105  WBC 8.8 8.9 7.5  NEUTROABS  --   --  5.7  HGB 9.4* 9.3* 8.8*  HCT 29.0* 29.1* 27.2*  MCV 90.6 90.4 90.7  PLT 160 160 152   Cardiac Enzymes:  Recent Labs Lab 01/09/14 2155 01/10/14 0105 01/10/14 0735  TROPONINI <0.30 <0.30 <0.30   BNP (last 3 results)  Recent Labs  01/09/14 1515  PROBNP 2595.0*   CBG:  Recent Labs Lab 01/10/14 0422 01/10/14 0442 01/10/14 0512 01/10/14 0759 01/10/14 1218  GLUCAP 40* 63* 117* 120* 144*    No results found for this or any previous visit (from the past 240 hour(s)).   Studies: Dg Chest 2 View  01/09/2014   CLINICAL DATA:  Persistent shortness of breath.  EXAM: CHEST  2 VIEW  COMPARISON:  07/03/2013.  FINDINGS: Mildly enlarged cardiac silhouette with a mild increase in size. Stable small amount of linear scarring on the left. Interval patchy opacity in the right lower lobe. Small amount of patchy opacity in the left lower lobe and right perihilar region. Stable mildly prominent interstitial  markings, diffuse osteopenia and thoracic spine degenerative changes, including changes of DISH. The hemidiaphragms are flattened on the lateral view.  IMPRESSION: 1. Bilateral patchy opacity, as described above, suspicious for pneumonia. 2. Stable mild changes of COPD. 3. Mild cardiomegaly with mild progression   Electronically Signed   By: Enrique Sack M.D.   On: 01/09/2014 16:54    Scheduled Meds: . allopurinol  200 mg Oral Daily  . amLODipine  10 mg Oral q morning - 10a  . antiseptic oral rinse  7 mL Mouth Rinse BID  . aspirin EC  81 mg Oral Daily  . [START ON 01/11/2014] azithromycin  500 mg Intravenous Q24H  . carvedilol  12.5 mg Oral BID WC  . cefTRIAXone (ROCEPHIN)  IV  1 g  Intravenous Q24H  . cholecalciferol  1,000 Units Oral Daily  . furosemide  40 mg Intravenous Daily  . heparin  5,000 Units Subcutaneous 3 times per day  . insulin aspart  0-9 Units Subcutaneous 6 times per day  . simvastatin  20 mg Oral QPM  . sodium chloride  3 mL Intravenous Q12H  . sodium chloride  3 mL Intravenous Q12H   Continuous Infusions:    Time spent: > 35 minutes   [name]  [facility] Pager [phone] If 7PM-7AM, please contact night-coverage at [email or web address], password [facility] 01/10/2014, 4:22 PM  LOS: 1 day

## 2014-01-10 NOTE — Progress Notes (Signed)
Utilization review completed.  

## 2014-01-10 NOTE — Progress Notes (Signed)
Hypoglycemic Event  CBG: 40  Treatment: 15 GM carbohydrate snack  Symptoms: Hungry  Follow-up CBG: [id number] CBG Result:63  Possible Reasons for Event: Unknown  Comments/MD notified:MD on call NP [place]      Yanky Vanderburg Sison  Remember to initiate Hypoglycemia Order Set & complete

## 2014-01-10 NOTE — Progress Notes (Signed)
Hypoglycemic Event  CBG: 65    Treatment: 15 GM carbohydrate snack  Symptoms: None  Follow-up CBG: Time:2348 CBG Result:69  Possible Reasons for Event: Change in activity  Comments/MD notified:NP, Almyra Deforest  Remember to initiate Hypoglycemia Order Set & complete

## 2014-01-10 NOTE — Progress Notes (Signed)
  Echocardiogram 2D Echocardiogram has been performed.  Doris Howell 01/10/2014, 11:41 AM

## 2014-01-10 NOTE — Progress Notes (Signed)
Medicare Important Message given?  YES (If response is "NO", the following Medicare IM given date fields will be blank) Date Medicare IM given:  01/10/2014 Medicare IM given by:  Kathlyne Loud 

## 2014-01-11 ENCOUNTER — Inpatient Hospital Stay (HOSPITAL_COMMUNITY): Payer: Medicare Other

## 2014-01-11 LAB — COMPREHENSIVE METABOLIC PANEL
ALT: 39 U/L — ABNORMAL HIGH (ref 0–35)
AST: 51 U/L — ABNORMAL HIGH (ref 0–37)
Albumin: 3.1 g/dL — ABNORMAL LOW (ref 3.5–5.2)
Alkaline Phosphatase: 85 U/L (ref 39–117)
Anion gap: 14 (ref 5–15)
BUN: 47 mg/dL — ABNORMAL HIGH (ref 6–23)
CO2: 25 mEq/L (ref 19–32)
Calcium: 9.1 mg/dL (ref 8.4–10.5)
Chloride: 103 mEq/L (ref 96–112)
Creatinine, Ser: 2.37 mg/dL — ABNORMAL HIGH (ref 0.50–1.10)
GFR calc Af Amer: 22 mL/min — ABNORMAL LOW (ref >90)
GFR calc non Af Amer: 19 mL/min — ABNORMAL LOW (ref >90)
Glucose, Bld: 88 mg/dL (ref 70–99)
Potassium: 4.2 mEq/L (ref 3.7–5.3)
Sodium: 142 mEq/L (ref 137–147)
Total Bilirubin: 0.3 mg/dL (ref 0.3–1.2)
Total Protein: 7.3 g/dL (ref 6.0–8.3)

## 2014-01-11 LAB — GLUCOSE, CAPILLARY
Glucose-Capillary: 122 mg/dL — ABNORMAL HIGH (ref 70–99)
Glucose-Capillary: 148 mg/dL — ABNORMAL HIGH (ref 70–99)
Glucose-Capillary: 162 mg/dL — ABNORMAL HIGH (ref 70–99)
Glucose-Capillary: 190 mg/dL — ABNORMAL HIGH (ref 70–99)
Glucose-Capillary: 76 mg/dL (ref 70–99)
Glucose-Capillary: 94 mg/dL (ref 70–99)
Glucose-Capillary: 94 mg/dL (ref 70–99)

## 2014-01-11 LAB — BASIC METABOLIC PANEL
Anion gap: 12 (ref 5–15)
BUN: 48 mg/dL — ABNORMAL HIGH (ref 6–23)
CO2: 26 mEq/L (ref 19–32)
Calcium: 9 mg/dL (ref 8.4–10.5)
Chloride: 103 mEq/L (ref 96–112)
Creatinine, Ser: 2.35 mg/dL — ABNORMAL HIGH (ref 0.50–1.10)
GFR calc Af Amer: 22 mL/min — ABNORMAL LOW (ref >90)
GFR calc non Af Amer: 19 mL/min — ABNORMAL LOW (ref >90)
Glucose, Bld: 112 mg/dL — ABNORMAL HIGH (ref 70–99)
Potassium: 6 mEq/L — ABNORMAL HIGH (ref 3.7–5.3)
Sodium: 141 mEq/L (ref 137–147)

## 2014-01-11 LAB — CBC WITH DIFFERENTIAL/PLATELET
Basophils Absolute: 0 10*3/uL (ref 0.0–0.1)
Basophils Relative: 0 % (ref 0–1)
Eosinophils Absolute: 0.2 10*3/uL (ref 0.0–0.7)
Eosinophils Relative: 3 % (ref 0–5)
HCT: 27 % — ABNORMAL LOW (ref 36.0–46.0)
Hemoglobin: 8.4 g/dL — ABNORMAL LOW (ref 12.0–15.0)
Lymphocytes Relative: 13 % (ref 12–46)
Lymphs Abs: 0.8 10*3/uL (ref 0.7–4.0)
MCH: 28.2 pg (ref 26.0–34.0)
MCHC: 31.1 g/dL (ref 30.0–36.0)
MCV: 90.6 fL (ref 78.0–100.0)
Monocytes Absolute: 0.9 10*3/uL (ref 0.1–1.0)
Monocytes Relative: 14 % — ABNORMAL HIGH (ref 3–12)
Neutro Abs: 4.3 10*3/uL (ref 1.7–7.7)
Neutrophils Relative %: 70 % (ref 43–77)
Platelets: 155 10*3/uL (ref 150–400)
RBC: 2.98 MIL/uL — ABNORMAL LOW (ref 3.87–5.11)
RDW: 13.8 % (ref 11.5–15.5)
WBC: 6.1 10*3/uL (ref 4.0–10.5)

## 2014-01-11 LAB — URINE CULTURE
Colony Count: NO GROWTH
Culture: NO GROWTH

## 2014-01-11 LAB — CREATININE, URINE, RANDOM: Creatinine, Urine: 48.92 mg/dL

## 2014-01-11 LAB — IRON AND TIBC
Iron: 26 ug/dL — ABNORMAL LOW (ref 42–135)
Saturation Ratios: 8 % — ABNORMAL LOW (ref 20–55)
TIBC: 327 ug/dL (ref 250–470)
UIBC: 301 ug/dL (ref 125–400)

## 2014-01-11 LAB — SODIUM, URINE, RANDOM: Sodium, Ur: 89 mEq/L

## 2014-01-11 MED ORDER — FUROSEMIDE 10 MG/ML IJ SOLN
60.0000 mg | Freq: Every day | INTRAMUSCULAR | Status: DC
  Administered 2014-01-12: 60 mg via INTRAVENOUS
  Filled 2014-01-11 (×2): qty 6

## 2014-01-11 MED ORDER — DEXTROSE 5 % IV SOLN
500.0000 mg | INTRAVENOUS | Status: DC
  Filled 2014-01-11: qty 500

## 2014-01-11 MED ORDER — DEXTROSE 5 % IV SOLN
500.0000 mg | Freq: Once | INTRAVENOUS | Status: AC
  Administered 2014-01-11: 500 mg via INTRAVENOUS
  Filled 2014-01-11: qty 500

## 2014-01-11 NOTE — Progress Notes (Signed)
[facility] PROGRESS NOTE  Doris Howell [id number] DOB: [birth year] DOA: 01/09/2014 PCP: Irven Shelling, MD  Assessment/Plan: Active Problems:   CAP (community acquired pneumonia) - Continue current antibiotic regimen    Acute respiratory failure with hypoxia - Multifactorial most likely combination of congestive heart failure most likely diastolic HF   CAP. - Supplemental oxygen as needed - Will increase Lasix to 60 mg IV daily, fluid restrict, daily weights, strict I/O's - Echocardiogram reports normal systolic function.  As such suspect diastolic dysfunction.    Anemia -Stable no active bleeding    AKI (acute kidney injury) - obtain urine sodium and urine creatinine - obtain renal ultrasound - diuresing well  Hyperglycemia - SSI - obtain Hba1c  Code Status: full Family Communication: d/c daughter and patient at bedside Disposition Plan: Pending improvement in condition   Consultants:  None  Procedures:  echocardiogram  Antibiotics:  Azithromycin  Ceftriaxone  HPI/Subjective: Pt has no new complaints. No acute issues reported overnight. States that she has been getting short of breath with activity  Objective: Filed Vitals:   01/11/14 1443  BP: 123/50  Pulse: 71  Temp: 98.1 F (36.7 C)  Resp: 18    Intake/Output Summary (Last 24 hours) at 01/11/14 1447 Last data filed at 01/11/14 1341  Gross per 24 hour  Intake    440 ml  Output   1100 ml  Net   -660 ml   Filed Weights   01/09/14 2015 01/10/14 0453 01/11/14 0427  Weight: 81.738 kg (180 lb 3.2 oz) 96.117 kg (211 lb 14.4 oz) 95.845 kg (211 lb 4.8 oz)    Exam:   General:  Pt in nad, alert and awake  Cardiovascular: rrr, no murmurs  Respiratory: decreased breath sounds at bases, no wheezes  Abdomen: soft, obese, NT  Musculoskeletal: no cyanosis   Data Reviewed: Basic Metabolic Panel:  Recent Labs Lab 01/09/14 1515 01/09/14 2155 01/10/14 0105 01/11/14 0117  01/11/14 0619  NA 144  --  140 141 142  K 4.3  --  3.9 6.0* 4.2  CL 107  --  102 103 103  CO2 23  --  26 26 25   GLUCOSE 126*  --  161* 112* 88  BUN 42*  --  44* 48* 47*  CREATININE 1.92* 2.01* 2.06* 2.35* 2.37*  CALCIUM 9.6  --  9.5 9.0 9.1   Liver Function Tests:  Recent Labs Lab 01/10/14 0105 01/11/14 0619  AST 34 51*  ALT 25 39*  ALKPHOS 87 85  BILITOT 0.4 0.3  PROT 7.5 7.3  ALBUMIN 3.3* 3.1*   No results found for this basename: LIPASE, AMYLASE,  in the last 168 hours No results found for this basename: AMMONIA,  in the last 168 hours CBC:  Recent Labs Lab 01/09/14 1515 01/09/14 2155 01/10/14 0105 01/11/14 0619  WBC 8.8 8.9 7.5 6.1  NEUTROABS  --   --  5.7 4.3  HGB 9.4* 9.3* 8.8* 8.4*  HCT 29.0* 29.1* 27.2* 27.0*  MCV 90.6 90.4 90.7 90.6  PLT 160 160 152 155   Cardiac Enzymes:  Recent Labs Lab 01/09/14 2155 01/10/14 0105 01/10/14 0735  TROPONINI <0.30 <0.30 <0.30   BNP (last 3 results)  Recent Labs  01/09/14 1515  PROBNP 2595.0*   CBG:  Recent Labs Lab 01/10/14 2358 01/11/14 0029 01/11/14 0415 01/11/14 0745 01/11/14 1214  GLUCAP 69* 94 94 76 162*    Recent Results (from the past 240 hour(s))  CULTURE, BLOOD (ROUTINE X 2)  Status: None   Collection Time    01/09/14  6:55 PM      Result Value Ref Range Status   Specimen Description BLOOD RIGHT HAND   Final   Special Requests BOTTLES DRAWN AEROBIC AND ANAEROBIC 5CC   Final   Culture  Setup Time     Final   Value: 01/10/2014 00:51     Performed at [facility]   Culture     Final   Value:        BLOOD CULTURE RECEIVED NO GROWTH TO DATE CULTURE WILL BE HELD FOR 5 DAYS BEFORE ISSUING A FINAL NEGATIVE REPORT     Performed at [facility]   Report Status PENDING   Incomplete  CULTURE, BLOOD (ROUTINE X 2)     Status: None   Collection Time    01/09/14  7:05 PM      Result Value Ref Range Status   Specimen Description BLOOD HAND LEFT   Final   Special Requests  BOTTLES DRAWN AEROBIC ONLY 1CC   Final   Culture  Setup Time     Final   Value: 01/10/2014 00:53     Performed at [facility]   Culture     Final   Value:        BLOOD CULTURE RECEIVED NO GROWTH TO DATE CULTURE WILL BE HELD FOR 5 DAYS BEFORE ISSUING A FINAL NEGATIVE REPORT     Performed at [facility]   Report Status PENDING   Incomplete  URINE CULTURE     Status: None   Collection Time    01/09/14 10:50 PM      Result Value Ref Range Status   Specimen Description URINE, CLEAN CATCH   Final   Special Requests NONE   Final   Culture  Setup Time     Final   Value: 01/10/2014 08:55     Performed at [place]     Final   Value: NO GROWTH     Performed at [facility]   Culture     Final   Value: NO GROWTH     Performed at [facility]   Report Status 01/11/2014 FINAL   Final     Studies: Dg Chest 2 View  01/09/2014   CLINICAL DATA:  Persistent shortness of breath.  EXAM: CHEST  2 VIEW  COMPARISON:  07/03/2013.  FINDINGS: Mildly enlarged cardiac silhouette with a mild increase in size. Stable small amount of linear scarring on the left. Interval patchy opacity in the right lower lobe. Small amount of patchy opacity in the left lower lobe and right perihilar region. Stable mildly prominent interstitial markings, diffuse osteopenia and thoracic spine degenerative changes, including changes of DISH. The hemidiaphragms are flattened on the lateral view.  IMPRESSION: 1. Bilateral patchy opacity, as described above, suspicious for pneumonia. 2. Stable mild changes of COPD. 3. Mild cardiomegaly with mild progression   Electronically Signed   By: Enrique Sack M.D.   On: 01/09/2014 16:54    Scheduled Meds: . allopurinol  200 mg Oral Daily  . amLODipine  10 mg Oral q morning - 10a  . antiseptic oral rinse  7 mL Mouth Rinse BID  . aspirin EC  81 mg Oral Daily  . azithromycin  500 mg Intravenous Q24H  . carvedilol  12.5 mg Oral BID WC  .  cefTRIAXone (ROCEPHIN)  IV  1 g Intravenous Q24H  . cholecalciferol  1,000 Units Oral Daily  . [START ON 01/12/2014] furosemide  60 mg Intravenous Daily  . heparin  5,000 Units Subcutaneous 3 times per day  . insulin aspart  0-9 Units Subcutaneous 6 times per day  . latanoprost  1 drop Both Eyes QHS  . simvastatin  20 mg Oral QPM  . sodium chloride  3 mL Intravenous Q12H  . sodium chloride  3 mL Intravenous Q12H   Continuous Infusions:    Time spent: > 35 minutes   [name]  [facility] Pager [phone] If 7PM-7AM, please contact night-coverage at [email or web address], password [facility] 01/11/2014, 2:47 PM  LOS: 2 days

## 2014-01-11 NOTE — Progress Notes (Signed)
Hypoglycemic Event  CBG: 69  Treatment: 15 GM carbohydrate snack  Symptoms: None  Follow-up CBG: Time:0015 CBG Result:94  Possible Reasons for Event: Unknown  Comments/MD notified:NP, Almyra Deforest  Remember to initiate Hypoglycemia Order Set & complete

## 2014-01-12 LAB — CBC WITH DIFFERENTIAL/PLATELET
Basophils Absolute: 0 10*3/uL (ref 0.0–0.1)
Basophils Relative: 0 % (ref 0–1)
Eosinophils Absolute: 0.2 10*3/uL (ref 0.0–0.7)
Eosinophils Relative: 4 % (ref 0–5)
HCT: 26.5 % — ABNORMAL LOW (ref 36.0–46.0)
Hemoglobin: 8.6 g/dL — ABNORMAL LOW (ref 12.0–15.0)
Lymphocytes Relative: 14 % (ref 12–46)
Lymphs Abs: 0.9 10*3/uL (ref 0.7–4.0)
MCH: 29.3 pg (ref 26.0–34.0)
MCHC: 32.5 g/dL (ref 30.0–36.0)
MCV: 90.1 fL (ref 78.0–100.0)
Monocytes Absolute: 0.9 10*3/uL (ref 0.1–1.0)
Monocytes Relative: 14 % — ABNORMAL HIGH (ref 3–12)
Neutro Abs: 4.5 10*3/uL (ref 1.7–7.7)
Neutrophils Relative %: 68 % (ref 43–77)
Platelets: 178 10*3/uL (ref 150–400)
RBC: 2.94 MIL/uL — ABNORMAL LOW (ref 3.87–5.11)
RDW: 13.7 % (ref 11.5–15.5)
WBC: 6.6 10*3/uL (ref 4.0–10.5)

## 2014-01-12 LAB — GLUCOSE, CAPILLARY
Glucose-Capillary: 59 mg/dL — ABNORMAL LOW (ref 70–99)
Glucose-Capillary: 102 mg/dL — ABNORMAL HIGH (ref 70–99)
Glucose-Capillary: 103 mg/dL — ABNORMAL HIGH (ref 70–99)
Glucose-Capillary: 182 mg/dL — ABNORMAL HIGH (ref 70–99)
Glucose-Capillary: 242 mg/dL — ABNORMAL HIGH (ref 70–99)
Glucose-Capillary: 120 mg/dL — ABNORMAL HIGH (ref 70–99)

## 2014-01-12 LAB — COMPREHENSIVE METABOLIC PANEL
ALT: 41 U/L — ABNORMAL HIGH (ref 0–35)
AST: 50 U/L — ABNORMAL HIGH (ref 0–37)
Albumin: 3 g/dL — ABNORMAL LOW (ref 3.5–5.2)
Alkaline Phosphatase: 88 U/L (ref 39–117)
Anion gap: 10 (ref 5–15)
BUN: 48 mg/dL — ABNORMAL HIGH (ref 6–23)
CO2: 28 mEq/L (ref 19–32)
Calcium: 9.1 mg/dL (ref 8.4–10.5)
Chloride: 102 mEq/L (ref 96–112)
Creatinine, Ser: 2.44 mg/dL — ABNORMAL HIGH (ref 0.50–1.10)
GFR calc Af Amer: 21 mL/min — ABNORMAL LOW (ref >90)
GFR calc non Af Amer: 18 mL/min — ABNORMAL LOW (ref >90)
Glucose, Bld: 62 mg/dL — ABNORMAL LOW (ref 70–99)
Potassium: 4.2 mEq/L (ref 3.7–5.3)
Sodium: 140 mEq/L (ref 137–147)
Total Bilirubin: 0.2 mg/dL — ABNORMAL LOW (ref 0.3–1.2)
Total Protein: 7 g/dL (ref 6.0–8.3)

## 2014-01-12 LAB — HEMOGLOBIN A1C
Hgb A1c MFr Bld: 6.8 % — ABNORMAL HIGH (ref <5.7)
Mean Plasma Glucose: 148 mg/dL — ABNORMAL HIGH (ref <117)

## 2014-01-12 MED ORDER — AZITHROMYCIN 500 MG PO TABS
500.0000 mg | ORAL_TABLET | Freq: Every day | ORAL | Status: DC
  Administered 2014-01-12: 500 mg via ORAL
  Filled 2014-01-12 (×3): qty 1

## 2014-01-12 NOTE — Progress Notes (Addendum)
Hypoglycemic Event  CBG: 59 Treatment: 4 oz orange juice  given Symptoms: Asymptomatic, alert and oriented Follow-up CBG: [id number] CBG Result:102  Possible Reasons for Event: Inadequate meal intake  Comments/MD notified:M Donnal Debar, NP Notified    Tonesha Tsou  Remember to initiate Hypoglycemia Order Set & complete

## 2014-01-12 NOTE — Progress Notes (Signed)
[facility] PROGRESS NOTE  Doris Howell [id number] DOB: [birth year] DOA: 01/09/2014 PCP: Irven Shelling, MD  Assessment/Plan: Active Problems:   CAP (community acquired pneumonia) - Continue current antibiotic regimen    Acute respiratory failure with hypoxia - Multifactorial most likely combination of congestive heart failure most likely diastolic HF - Pt improving with diuresis and antibiotic regimen.   CAP. - Supplemental oxygen as needed, will wean to room air. - Will hold lasix dose given elevation in creatinine - Echocardiogram reports normal systolic function.  As such suspect diastolic dysfunction.    Anemia -Stable no active bleeding    AKI (acute kidney injury) - obtain urine sodium and urine creatinine - Renal ultrasound reporting no hydronephrosis and left kidney is surgically absent - diuresing well  Hyperglycemia - SSI - obtain Hba1c  Code Status: full Family Communication: d/c daughter and patient at bedside Disposition Plan: Pending improvement in condition   Consultants:  None  Procedures:  echocardiogram  Antibiotics:  Azithromycin  Ceftriaxone  HPI/Subjective: Pt reports less shortness of breath with activity. Reports improvement in condition.  Objective: Filed Vitals:   01/12/14 1126  BP: 111/44  Pulse:   Temp:   Resp:     Intake/Output Summary (Last 24 hours) at 01/12/14 1343 Last data filed at 01/12/14 1307  Gross per 24 hour  Intake   1533 ml  Output   1541 ml  Net     -8 ml   Filed Weights   01/10/14 0453 01/11/14 0427 01/12/14 0500  Weight: 96.117 kg (211 lb 14.4 oz) 95.845 kg (211 lb 4.8 oz) 95.346 kg (210 lb 3.2 oz)    Exam:   General:  Pt in nad, alert and awake  Cardiovascular: rrr, no murmurs  Respiratory: decreased breath sounds at bases, no wheezes  Abdomen: soft, obese, NT  Musculoskeletal: no cyanosis   Data Reviewed: Basic Metabolic Panel:  Recent Labs Lab 01/09/14 1515  01/09/14 2155 01/10/14 0105 01/11/14 0117 01/11/14 0619 01/12/14 0402  NA 144  --  140 141 142 140  K 4.3  --  3.9 6.0* 4.2 4.2  CL 107  --  102 103 103 102  CO2 23  --  26 26 25 28   GLUCOSE 126*  --  161* 112* 88 62*  BUN 42*  --  44* 48* 47* 48*  CREATININE 1.92* 2.01* 2.06* 2.35* 2.37* 2.44*  CALCIUM 9.6  --  9.5 9.0 9.1 9.1   Liver Function Tests:  Recent Labs Lab 01/10/14 0105 01/11/14 0619 01/12/14 0402  AST 34 51* 50*  ALT 25 39* 41*  ALKPHOS 87 85 88  BILITOT 0.4 0.3 0.2*  PROT 7.5 7.3 7.0  ALBUMIN 3.3* 3.1* 3.0*   No results found for this basename: LIPASE, AMYLASE,  in the last 168 hours No results found for this basename: AMMONIA,  in the last 168 hours CBC:  Recent Labs Lab 01/09/14 1515 01/09/14 2155 01/10/14 0105 01/11/14 0619 01/12/14 0402  WBC 8.8 8.9 7.5 6.1 6.6  NEUTROABS  --   --  5.7 4.3 4.5  HGB 9.4* 9.3* 8.8* 8.4* 8.6*  HCT 29.0* 29.1* 27.2* 27.0* 26.5*  MCV 90.6 90.4 90.7 90.6 90.1  PLT 160 160 152 155 178   Cardiac Enzymes:  Recent Labs Lab 01/09/14 2155 01/10/14 0105 01/10/14 0735  TROPONINI <0.30 <0.30 <0.30   BNP (last 3 results)  Recent Labs  01/09/14 1515  PROBNP 2595.0*   CBG:  Recent Labs Lab 01/11/14 2355 01/12/14 0425 01/12/14  [id number] 01/12/14 0746 01/12/14 1155  GLUCAP 190* 59* 102* 103* 182*    Recent Results (from the past 240 hour(s))  CULTURE, BLOOD (ROUTINE X 2)     Status: None   Collection Time    01/09/14  6:55 PM      Result Value Ref Range Status   Specimen Description BLOOD RIGHT HAND   Final   Special Requests BOTTLES DRAWN AEROBIC AND ANAEROBIC 5CC   Final   Culture  Setup Time     Final   Value: 01/10/2014 00:51     Performed at [facility]   Culture     Final   Value:        BLOOD CULTURE RECEIVED NO GROWTH TO DATE CULTURE WILL BE HELD FOR 5 DAYS BEFORE ISSUING A FINAL NEGATIVE REPORT     Performed at [facility]   Report Status PENDING   Incomplete  CULTURE, BLOOD  (ROUTINE X 2)     Status: None   Collection Time    01/09/14  7:05 PM      Result Value Ref Range Status   Specimen Description BLOOD HAND LEFT   Final   Special Requests BOTTLES DRAWN AEROBIC ONLY [place]   Final   Culture  Setup Time     Final   Value: 01/10/2014 00:53     Performed at [facility]   Culture     Final   Value:        BLOOD CULTURE RECEIVED NO GROWTH TO DATE CULTURE WILL BE HELD FOR 5 DAYS BEFORE ISSUING A FINAL NEGATIVE REPORT     Performed at [facility]   Report Status PENDING   Incomplete  URINE CULTURE     Status: None   Collection Time    01/09/14 10:50 PM      Result Value Ref Range Status   Specimen Description URINE, CLEAN CATCH   Final   Special Requests NONE   Final   Culture  Setup Time     Final   Value: 01/10/2014 08:55     Performed at [place]     Final   Value: NO GROWTH     Performed at [facility]   Culture     Final   Value: NO GROWTH     Performed at [facility]   Report Status 01/11/2014 FINAL   Final     Studies: US Renal  01/11/2014   CLINICAL DATA:  Acute renal failure; BUN of 47 and creatinine 2.37; previous left nephrectomy ; history of kidney stones, hypertension, and diabetes  EXAM: RENAL/URINARY TRACT ULTRASOUND COMPLETE  COMPARISON:  MRI of the abdomen of October 29, 2013.  FINDINGS: Right Kidney:  Length: 11.8 cm. The cortical echotexture is mildly increased and exceeds that of the adjacent liver on some images. There is no hydronephrosis. There is no focal mass. No echogenic shadowing stones are demonstrated.  Left Kidney:  The left kidney is surgically absent.  Bladder:  Appears normal for degree of bladder distention.  IMPRESSION: The echotexture of the right kidney is increased consistent with medical renal disease. There is no hydronephrosis. The urinary bladder is unremarkable. The left kidney is surgically absent.   Electronically Signed   By: David  [place]ique   On: 01/11/2014  16:59    Scheduled Meds: . allopurinol  200 mg Oral Daily  . amLODipine  10 mg Oral q morning - 10a  .  antiseptic oral rinse  7 mL Mouth Rinse BID  . aspirin EC  81 mg Oral Daily  . azithromycin  500 mg Oral Daily  . carvedilol  12.5 mg Oral BID WC  . cefTRIAXone (ROCEPHIN)  IV  1 g Intravenous Q24H  . cholecalciferol  1,000 Units Oral Daily  . heparin  5,000 Units Subcutaneous 3 times per day  . insulin aspart  0-9 Units Subcutaneous 6 times per day  . latanoprost  1 drop Both Eyes QHS  . simvastatin  20 mg Oral QPM  . sodium chloride  3 mL Intravenous Q12H  . sodium chloride  3 mL Intravenous Q12H   Continuous Infusions:    Time spent: > 35 minutes   [name]  [facility] Pager [phone] If 7PM-7AM, please contact night-coverage at [email or web address], password [facility] 01/12/2014, 1:43 PM  LOS: 3 days

## 2014-01-12 NOTE — Progress Notes (Signed)
Patient Saturations on Room Air at Rest = 90%  Patient Saturations on [employer] while Ambulating = 89%  Patient Saturations on 1 Liters of oxygen while Ambulating = 95%

## 2014-01-12 NOTE — Progress Notes (Signed)
PHARMACIST - PHYSICIAN COMMUNICATION DR: [name] CONCERNING: Antibiotic IV to Oral Route Change Policy  RECOMMENDATION: This patient is receiving Azithromycin by the intravenous route.  Based on criteria approved by the Pharmacy and Therapeutics Committee, the antibiotic(s) is/are being converted to the equivalent oral dose form(s).  DESCRIPTION: These criteria include:  Patient being treated for a respiratory tract infection, urinary tract infection, cellulitis or clostridium difficile associated diarrhea if on metronidazole  The patient is not neutropenic and does not exhibit a GI malabsorption state  The patient is eating (either orally or via tube) and/or has been taking other orally administered medications for a least 24 hours  The patient is improving clinically and has a Tmax < 100.5  If you have questions about this conversion, please contact the Pharmacy Department  []   [phone] )  Doris Howell [x]   [phone] )  Doris Howell  []   [phone] )  Doris Howell []   [phone] )  Doris Howell

## 2014-01-13 DIAGNOSIS — J189 Pneumonia, unspecified organism: Principal | ICD-10-CM

## 2014-01-13 DIAGNOSIS — D649 Anemia, unspecified: Secondary | ICD-10-CM

## 2014-01-13 LAB — GLUCOSE, CAPILLARY
Glucose-Capillary: 121 mg/dL — ABNORMAL HIGH (ref 70–99)
Glucose-Capillary: 156 mg/dL — ABNORMAL HIGH (ref 70–99)
Glucose-Capillary: 172 mg/dL — ABNORMAL HIGH (ref 70–99)
Glucose-Capillary: 74 mg/dL (ref 70–99)

## 2014-01-13 LAB — CBC WITH DIFFERENTIAL/PLATELET
Basophils Absolute: 0 10*3/uL (ref 0.0–0.1)
Basophils Relative: 0 % (ref 0–1)
Eosinophils Absolute: 0.3 10*3/uL (ref 0.0–0.7)
Eosinophils Relative: 5 % (ref 0–5)
HCT: 26.8 % — ABNORMAL LOW (ref 36.0–46.0)
Hemoglobin: 8.8 g/dL — ABNORMAL LOW (ref 12.0–15.0)
Lymphocytes Relative: 19 % (ref 12–46)
Lymphs Abs: 1.1 10*3/uL (ref 0.7–4.0)
MCH: 29.4 pg (ref 26.0–34.0)
MCHC: 32.8 g/dL (ref 30.0–36.0)
MCV: 89.6 fL (ref 78.0–100.0)
Monocytes Absolute: 0.7 10*3/uL (ref 0.1–1.0)
Monocytes Relative: 12 % (ref 3–12)
Neutro Abs: 3.6 10*3/uL (ref 1.7–7.7)
Neutrophils Relative %: 64 % (ref 43–77)
Platelets: 195 10*3/uL (ref 150–400)
RBC: 2.99 MIL/uL — ABNORMAL LOW (ref 3.87–5.11)
RDW: 13.6 % (ref 11.5–15.5)
WBC: 5.7 10*3/uL (ref 4.0–10.5)

## 2014-01-13 LAB — COMPREHENSIVE METABOLIC PANEL
ALT: 57 U/L — ABNORMAL HIGH (ref 0–35)
AST: 69 U/L — ABNORMAL HIGH (ref 0–37)
Albumin: 3 g/dL — ABNORMAL LOW (ref 3.5–5.2)
Alkaline Phosphatase: 95 U/L (ref 39–117)
Anion gap: 11 (ref 5–15)
BUN: 46 mg/dL — ABNORMAL HIGH (ref 6–23)
CO2: 28 mEq/L (ref 19–32)
Calcium: 9.3 mg/dL (ref 8.4–10.5)
Chloride: 101 mEq/L (ref 96–112)
Creatinine, Ser: 2.17 mg/dL — ABNORMAL HIGH (ref 0.50–1.10)
GFR calc Af Amer: 24 mL/min — ABNORMAL LOW (ref >90)
GFR calc non Af Amer: 21 mL/min — ABNORMAL LOW (ref >90)
Glucose, Bld: 93 mg/dL (ref 70–99)
Potassium: 4.3 mEq/L (ref 3.7–5.3)
Sodium: 140 mEq/L (ref 137–147)
Total Bilirubin: <0.2 mg/dL — ABNORMAL LOW (ref 0.3–1.2)
Total Protein: 7.1 g/dL (ref 6.0–8.3)

## 2014-01-13 MED ORDER — ALBUTEROL SULFATE HFA 108 (90 BASE) MCG/ACT IN AERS: 2.0000 | INHALATION_SPRAY | Freq: Four times a day (QID) | RESPIRATORY_TRACT | Status: AC | PRN

## 2014-01-13 MED ORDER — AZITHROMYCIN 500 MG PO TABS: 500.0000 mg | ORAL_TABLET | Freq: Every day | ORAL | Status: DC

## 2014-01-13 MED ORDER — CEFDINIR 300 MG PO CAPS: 300.0000 mg | ORAL_CAPSULE | Freq: Every day | ORAL | Status: DC

## 2014-01-13 NOTE — Progress Notes (Signed)
CARE MANAGEMENT NOTE 01/13/2014  Patient:  ELEXYS, HENLEY   Account Number:  [id number]  Date Initiated:  01/10/2014  Documentation initiated by:  [facility]  Subjective/Objective Assessment:   hypoxia, CAP, CHF     Action/Plan:   Anticipated DC Date:  01/13/2014   Anticipated DC Plan:  [place]  CM consult      Choice offered to / List presented to:             Status of service:  Completed, signed off Medicare Important Message given?  YES (If response is "NO", the following Medicare IM given date fields will be blank) Date Medicare IM given:  01/10/2014 Medicare IM given by:  [facility] Date Additional Medicare IM given:   Additional Medicare IM given by:    Discharge Disposition:  HOME/SELF CARE  Per UR Regulation:  Reviewed for med. necessity/level of care/duration of stay  If discussed at [place] of Stay Meetings, dates discussed:    Comments:  01/13/2014 [place] Case Mgmt phone [phone] (late entry)  Medicare Important Message given?  YES (If response is "NO", the following Medicare IM given date fields will be blank) Date Medicare IM given:  01/13/2014 Medicare IM given by:  Tomi Bamberger

## 2014-01-13 NOTE — Progress Notes (Signed)
Verbally understood dc instructions, no questions asked

## 2014-01-13 NOTE — Discharge Summary (Signed)
Physician Discharge Summary  Doris Howell [id number] DOB: [birth year] DOA: 01/09/2014  PCP: Doris Shelling, MD  Admit date: 01/09/2014 Discharge date: 01/13/2014  Time spent: > 35 minutes  Recommendations for Outpatient Follow-up:  1. Please reassess Serum creatinine levels 2. Decide when to continue lasix. Will be held on discharge 3. Will place on Azithromycin and Omnicef   Discharge Diagnoses:  Active Problems:   CAP (community acquired pneumonia)   Acute respiratory failure with hypoxia   Anemia   AKI (acute kidney injury)   Discharge Condition: stable  Diet recommendation: low sodium heart healthy  Filed Weights   01/11/14 0427 01/12/14 0500 01/13/14 0508  Weight: 95.845 kg (211 lb 4.8 oz) 95.346 kg (210 lb 3.2 oz) 95.346 kg (210 lb 3.2 oz)    History of present illness:  From original HPI: This is a 78 y.o. year old female with significant past medical history of hypertension, presenting with acute respiratory failure with hypoxia. Patient/family report patient with increased work breathing over the past 2 days.  On initial evaluation was diagnosed with pneumonia after chest x ray in the ED.  Hospital Course:   Active Problems:  CAP (community acquired pneumonia)  - Improving on antibiotic regimen. We'll plan on discharging on azithromycin and Omnicef  Acute respiratory failure with hypoxia  - I suspect at this juncture shortness of breath most likely from infectious etiology as opposed to CHF.  As such will hold Lasix on discharge  CAP.  - Breathing comfortably on room air.  - Will hold lasix dose given elevation in creatinine  - Echocardiogram reports normal systolic function. Patient may have had a diastolic component although echocardiogram did not report diastolic dysfunction  Anemia  -Stable no active bleeding   AKI (acute kidney injury)  - Renal ultrasound reporting no hydronephrosis and left kidney is surgically absent  - On discharge  will hold Lasix and ARB  Hyperglycemia  - Recommend diabetic diet and we'll have patient continue home hypoglycemic regimen  Procedures:  Echocardiogram  Consultations:  none  Discharge Exam: Filed Vitals:   01/13/14 1332  BP: 130/50  Pulse: 64  Temp: 98.6 F (37 C)  Resp: 20    General: Pt in nad, alert and awake Cardiovascular: rrr, no mrg Respiratory: cta bl, no wheezes, speaking in full sentences  Discharge Instructions You were cared for by a hospitalist during your hospital stay. If you have any questions about your discharge medications or the care you received while you were in the hospital after you are discharged, you can call the unit and asked to speak with the hospitalist on call if the hospitalist that took care of you is not available. Once you are discharged, your primary care physician will handle any further medical issues. Please note that NO REFILLS for any discharge medications will be authorized once you are discharged, as it is imperative that you return to your primary care physician (or establish a relationship with a primary care physician if you do not have one) for your aftercare needs so that they can reassess your need for medications and monitor your lab values.  Discharge Instructions   Call MD for:  difficulty breathing, headache or visual disturbances    Complete by:  As directed      Call MD for:  persistant nausea and vomiting    Complete by:  As directed      Call MD for:  temperature >100.4    Complete by:  As directed  Diet - low sodium heart healthy    Complete by:  As directed      Discharge instructions    Complete by:  As directed   Please be sure to follow up with your primary care physician within the next week. Please have him assessed your BMPs let you can know what your serum creatinine level is. During reevaluation also discuss when to continue your Lasix.     Increase activity slowly    Complete by:  As directed            Current Discharge Medication List    START taking these medications   Details  azithromycin (ZITHROMAX) 500 MG tablet Take 1 tablet (500 mg total) by mouth daily. Qty: 3 tablet, Refills: 0    cefdinir (OMNICEF) 300 MG capsule Take 1 capsule (300 mg total) by mouth daily. Qty: 4 capsule, Refills: 0      CONTINUE these medications which have NOT CHANGED   Details  acetaminophen (TYLENOL) 500 MG tablet Take 500 mg by mouth every 6 (six) hours as needed for pain.    allopurinol (ZYLOPRIM) 100 MG tablet Take 200 mg by mouth daily.    amLODipine (NORVASC) 10 MG tablet Take 10 mg by mouth every morning.     aspirin EC 81 MG tablet Take 81 mg by mouth daily.    bimatoprost (LUMIGAN) 0.01 % SOLN Place 1 drop into both eyes at bedtime.     carvedilol (COREG) 12.5 MG tablet Take 12.5 mg by mouth 2 (two) times daily with a meal.    cholecalciferol (VITAMIN D) 1000 UNITS tablet Take 1,000 Units by mouth daily.    glimepiride (AMARYL) 1 MG tablet Take 1 mg by mouth daily before breakfast.    simvastatin (ZOCOR) 20 MG tablet Take 20 mg by mouth every evening.      STOP taking these medications     valsartan (DIOVAN) 320 MG tablet      furosemide (LASIX) 80 MG tablet        Allergies  Allergen Reactions  . Codeine Other (See Comments)    Unknown; pt can't remember. It was in the '70s      The results of significant diagnostics from this hospitalization (including imaging, microbiology, ancillary and laboratory) are listed below for reference.    Significant Diagnostic Studies: Dg Chest 2 View  01/09/2014   CLINICAL DATA:  Persistent shortness of breath.  EXAM: CHEST  2 VIEW  COMPARISON:  07/03/2013.  FINDINGS: Mildly enlarged cardiac silhouette with a mild increase in size. Stable small amount of linear scarring on the left. Interval patchy opacity in the right lower lobe. Small amount of patchy opacity in the left lower lobe and right perihilar region. Stable mildly  prominent interstitial markings, diffuse osteopenia and thoracic spine degenerative changes, including changes of DISH. The hemidiaphragms are flattened on the lateral view.  IMPRESSION: 1. Bilateral patchy opacity, as described above, suspicious for pneumonia. 2. Stable mild changes of COPD. 3. Mild cardiomegaly with mild progression   Electronically Signed   By: Enrique Sack M.D.   On: 01/09/2014 16:54   US Renal  01/11/2014   CLINICAL DATA:  Acute renal failure; BUN of 47 and creatinine 2.37; previous left nephrectomy ; history of kidney stones, hypertension, and diabetes  EXAM: RENAL/URINARY TRACT ULTRASOUND COMPLETE  COMPARISON:  MRI of the abdomen of October 29, 2013.  FINDINGS: Right Kidney:  Length: 11.8 cm. The cortical echotexture is mildly increased and exceeds that of  the adjacent liver on some images. There is no hydronephrosis. There is no focal mass. No echogenic shadowing stones are demonstrated.  Left Kidney:  The left kidney is surgically absent.  Bladder:  Appears normal for degree of bladder distention.  IMPRESSION: The echotexture of the right kidney is increased consistent with medical renal disease. There is no hydronephrosis. The urinary bladder is unremarkable. The left kidney is surgically absent.   Electronically Signed   By: David  Martinique   On: 01/11/2014 16:59    Microbiology: Recent Results (from the past 240 hour(s))  CULTURE, BLOOD (ROUTINE X 2)     Status: None   Collection Time    01/09/14  6:55 PM      Result Value Ref Range Status   Specimen Description BLOOD RIGHT HAND   Final   Special Requests BOTTLES DRAWN AEROBIC AND ANAEROBIC 5CC   Final   Culture  Setup Time     Final   Value: 01/10/2014 00:51     Performed at [facility]   Culture     Final   Value:        BLOOD CULTURE RECEIVED NO GROWTH TO DATE CULTURE WILL BE HELD FOR 5 DAYS BEFORE ISSUING A FINAL NEGATIVE REPORT     Performed at [facility]   Report Status PENDING   Incomplete   CULTURE, BLOOD (ROUTINE X 2)     Status: None   Collection Time    01/09/14  7:05 PM      Result Value Ref Range Status   Specimen Description BLOOD HAND LEFT   Final   Special Requests BOTTLES DRAWN AEROBIC ONLY 1CC   Final   Culture  Setup Time     Final   Value: 01/10/2014 00:53     Performed at [facility]   Culture     Final   Value:        BLOOD CULTURE RECEIVED NO GROWTH TO DATE CULTURE WILL BE HELD FOR 5 DAYS BEFORE ISSUING A FINAL NEGATIVE REPORT     Performed at [facility]   Report Status PENDING   Incomplete  URINE CULTURE     Status: None   Collection Time    01/09/14 10:50 PM      Result Value Ref Range Status   Specimen Description URINE, CLEAN CATCH   Final   Special Requests NONE   Final   Culture  Setup Time     Final   Value: 01/10/2014 08:55     Performed at [facility]     Final   Value: NO GROWTH     Performed at [facility]   Culture     Final   Value: NO GROWTH     Performed at [facility]   Report Status 01/11/2014 FINAL   Final     Labs: Basic Metabolic Panel:  Recent Labs Lab 01/10/14 0105 01/11/14 0117 01/11/14 0619 01/12/14 0402 01/13/14 0500  NA 140 141 142 140 140  K 3.9 6.0* 4.2 4.2 4.3  CL 102 103 103 102 101  CO2 26 26 25 28 28   GLUCOSE 161* 112* 88 62* 93  BUN 44* 48* 47* 48* 46*  CREATININE 2.06* 2.35* 2.37* 2.44* 2.17*  CALCIUM 9.5 9.0 9.1 9.1 9.3   Liver Function Tests:  Recent Labs Lab 01/10/14 0105 01/11/14 0619 01/12/14 0402 01/13/14 0500  AST 34 51* 50* 69*  ALT 25 39*  41* 57*  ALKPHOS 87 85 88 95  BILITOT 0.4 0.3 0.2* <0.2*  PROT 7.5 7.3 7.0 7.1  ALBUMIN 3.3* 3.1* 3.0* 3.0*   No results found for this basename: LIPASE, AMYLASE,  in the last 168 hours No results found for this basename: AMMONIA,  in the last 168 hours CBC:  Recent Labs Lab 01/09/14 2155 01/10/14 0105 01/11/14 0619 01/12/14 0402 01/13/14 0500  WBC 8.9 7.5 6.1 6.6 5.7   NEUTROABS  --  5.7 4.3 4.5 3.6  HGB 9.3* 8.8* 8.4* 8.6* 8.8*  HCT 29.1* 27.2* 27.0* 26.5* 26.8*  MCV 90.4 90.7 90.6 90.1 89.6  PLT 160 152 155 178 195   Cardiac Enzymes:  Recent Labs Lab 01/09/14 2155 01/10/14 0105 01/10/14 0735  TROPONINI <0.30 <0.30 <0.30   BNP: BNP (last 3 results)  Recent Labs  01/09/14 1515  PROBNP 2595.0*   CBG:  Recent Labs Lab 01/12/14 2015 01/13/14 0035 01/13/14 0420 01/13/14 0755 01/13/14 1159  GLUCAP 120* 156* 121* 74 172*      Signed:  [name]  [facility] 01/13/2014, 2:49 PM

## 2014-01-15 LAB — CULTURE, BLOOD (ROUTINE X 2): Culture: NO GROWTH

## 2014-01-16 LAB — CULTURE, BLOOD (ROUTINE X 2): Culture: NO GROWTH

## 2014-02-05 ENCOUNTER — Inpatient Hospital Stay (HOSPITAL_COMMUNITY)
Admission: EM | Admit: 2014-02-05 | Discharge: 2014-02-11 | DRG: 291 | Disposition: A | Discharge status: Home / Self Care | Payer: Medicare Other | Attending: Internal Medicine | Admitting: Internal Medicine

## 2014-02-05 ENCOUNTER — Emergency Department (HOSPITAL_COMMUNITY): Payer: Medicare Other

## 2014-02-05 ENCOUNTER — Observation Stay (HOSPITAL_COMMUNITY): Payer: Medicare Other

## 2014-02-05 ENCOUNTER — Encounter (HOSPITAL_COMMUNITY): Payer: Self-pay | Admitting: Emergency Medicine

## 2014-02-05 DIAGNOSIS — Z79899 Other long term (current) drug therapy: Secondary | ICD-10-CM

## 2014-02-05 DIAGNOSIS — Z9841 Cataract extraction status, right eye: Secondary | ICD-10-CM

## 2014-02-05 DIAGNOSIS — R0602 Shortness of breath: Secondary | ICD-10-CM | POA: Diagnosis present

## 2014-02-05 DIAGNOSIS — Z961 Presence of intraocular lens: Secondary | ICD-10-CM | POA: Diagnosis present

## 2014-02-05 DIAGNOSIS — N184 Chronic kidney disease, stage 4 (severe): Secondary | ICD-10-CM | POA: Diagnosis present

## 2014-02-05 DIAGNOSIS — N179 Acute kidney failure, unspecified: Secondary | ICD-10-CM | POA: Diagnosis present

## 2014-02-05 DIAGNOSIS — I5033 Acute on chronic diastolic (congestive) heart failure: Secondary | ICD-10-CM | POA: Diagnosis present

## 2014-02-05 DIAGNOSIS — E875 Hyperkalemia: Secondary | ICD-10-CM | POA: Diagnosis not present

## 2014-02-05 DIAGNOSIS — Z7982 Long term (current) use of aspirin: Secondary | ICD-10-CM | POA: Diagnosis not present

## 2014-02-05 DIAGNOSIS — E780 Pure hypercholesterolemia: Secondary | ICD-10-CM | POA: Diagnosis present

## 2014-02-05 DIAGNOSIS — J9601 Acute respiratory failure with hypoxia: Secondary | ICD-10-CM | POA: Diagnosis present

## 2014-02-05 DIAGNOSIS — I5031 Acute diastolic (congestive) heart failure: Secondary | ICD-10-CM

## 2014-02-05 DIAGNOSIS — I129 Hypertensive chronic kidney disease with stage 1 through stage 4 chronic kidney disease, or unspecified chronic kidney disease: Secondary | ICD-10-CM | POA: Diagnosis present

## 2014-02-05 DIAGNOSIS — D649 Anemia, unspecified: Secondary | ICD-10-CM | POA: Diagnosis present

## 2014-02-05 DIAGNOSIS — Z9842 Cataract extraction status, left eye: Secondary | ICD-10-CM | POA: Diagnosis not present

## 2014-02-05 DIAGNOSIS — I5032 Chronic diastolic (congestive) heart failure: Secondary | ICD-10-CM | POA: Diagnosis present

## 2014-02-05 DIAGNOSIS — Y95 Nosocomial condition: Secondary | ICD-10-CM | POA: Diagnosis present

## 2014-02-05 DIAGNOSIS — Z6839 Body mass index (BMI) 39.0-39.9, adult: Secondary | ICD-10-CM | POA: Diagnosis not present

## 2014-02-05 DIAGNOSIS — E119 Type 2 diabetes mellitus without complications: Secondary | ICD-10-CM | POA: Diagnosis present

## 2014-02-05 DIAGNOSIS — J189 Pneumonia, unspecified organism: Secondary | ICD-10-CM | POA: Diagnosis present

## 2014-02-05 DIAGNOSIS — Z90710 Acquired absence of both cervix and uterus: Secondary | ICD-10-CM | POA: Diagnosis not present

## 2014-02-05 DIAGNOSIS — N183 Chronic kidney disease, stage 3 unspecified: Secondary | ICD-10-CM

## 2014-02-05 DIAGNOSIS — I509 Heart failure, unspecified: Secondary | ICD-10-CM

## 2014-02-05 LAB — CBC
HCT: 29.5 % — ABNORMAL LOW (ref 36.0–46.0)
Hemoglobin: 9.6 g/dL — ABNORMAL LOW (ref 12.0–15.0)
MCH: 29.7 pg (ref 26.0–34.0)
MCHC: 32.5 g/dL (ref 30.0–36.0)
MCV: 91.3 fL (ref 78.0–100.0)
Platelets: 181 10*3/uL (ref 150–400)
RBC: 3.23 MIL/uL — ABNORMAL LOW (ref 3.87–5.11)
RDW: 14.8 % (ref 11.5–15.5)
WBC: 7.5 10*3/uL (ref 4.0–10.5)

## 2014-02-05 LAB — BASIC METABOLIC PANEL
Anion gap: 17 — ABNORMAL HIGH (ref 5–15)
BUN: 38 mg/dL — ABNORMAL HIGH (ref 6–23)
CO2: 18 mEq/L — ABNORMAL LOW (ref 19–32)
Calcium: 9.9 mg/dL (ref 8.4–10.5)
Chloride: 107 mEq/L (ref 96–112)
Creatinine, Ser: 1.82 mg/dL — ABNORMAL HIGH (ref 0.50–1.10)
GFR calc Af Amer: 30 mL/min — ABNORMAL LOW (ref >90)
GFR calc non Af Amer: 26 mL/min — ABNORMAL LOW (ref >90)
Glucose, Bld: 101 mg/dL — ABNORMAL HIGH (ref 70–99)
Potassium: 4.3 mEq/L (ref 3.7–5.3)
Sodium: 142 mEq/L (ref 137–147)

## 2014-02-05 LAB — URINALYSIS, ROUTINE W REFLEX MICROSCOPIC
Bilirubin Urine: NEGATIVE
Glucose, UA: NEGATIVE mg/dL
Hgb urine dipstick: NEGATIVE
Ketones, ur: NEGATIVE mg/dL
Leukocytes, UA: NEGATIVE
Nitrite: NEGATIVE
Protein, ur: NEGATIVE mg/dL
Specific Gravity, Urine: 1.007 (ref 1.005–1.030)
Urobilinogen, UA: 0.2 mg/dL (ref 0.0–1.0)
pH: 5 (ref 5.0–8.0)

## 2014-02-05 LAB — CREATININE, URINE, RANDOM: Creatinine, Urine: 24.34 mg/dL

## 2014-02-05 LAB — PRO B NATRIURETIC PEPTIDE: Pro B Natriuretic peptide (BNP): 2767 pg/mL — ABNORMAL HIGH (ref 0–450)

## 2014-02-05 LAB — I-STAT TROPONIN, ED: Troponin i, poc: 0.03 ng/mL (ref 0.00–0.08)

## 2014-02-05 LAB — GLUCOSE, CAPILLARY
Glucose-Capillary: 143 mg/dL — ABNORMAL HIGH (ref 70–99)
Glucose-Capillary: 128 mg/dL — ABNORMAL HIGH (ref 70–99)

## 2014-02-05 LAB — SODIUM, URINE, RANDOM: Sodium, Ur: 121 mEq/L

## 2014-02-05 MED ORDER — VANCOMYCIN HCL IN DEXTROSE 1-5 GM/200ML-% IV SOLN
1000.0000 mg | Freq: Once | INTRAVENOUS | Status: AC
  Administered 2014-02-06: 1000 mg via INTRAVENOUS
  Filled 2014-02-05 (×2): qty 200

## 2014-02-05 MED ORDER — LATANOPROST 0.005 % OP SOLN
1.0000 [drp] | Freq: Every day | OPHTHALMIC | Status: DC
  Administered 2014-02-06 – 2014-02-10 (×6): 1 [drp] via OPHTHALMIC
  Filled 2014-02-05 (×2): qty 2.5

## 2014-02-05 MED ORDER — CARVEDILOL 12.5 MG PO TABS
12.5000 mg | ORAL_TABLET | Freq: Two times a day (BID) | ORAL | Status: DC
  Administered 2014-02-05 – 2014-02-11 (×12): 12.5 mg via ORAL
  Filled 2014-02-05 (×13): qty 1

## 2014-02-05 MED ORDER — NITROGLYCERIN 2 % TD OINT
1.0000 [in_us] | TOPICAL_OINTMENT | Freq: Once | TRANSDERMAL | Status: AC
  Administered 2014-02-05: 1 [in_us] via TOPICAL
  Filled 2014-02-05: qty 1

## 2014-02-05 MED ORDER — CARVEDILOL 12.5 MG PO TABS
12.5000 mg | ORAL_TABLET | Freq: Two times a day (BID) | ORAL | Status: DC
  Filled 2014-02-05 (×2): qty 1

## 2014-02-05 MED ORDER — ENOXAPARIN SODIUM 30 MG/0.3ML ~~LOC~~ SOLN
30.0000 mg | SUBCUTANEOUS | Status: DC
  Administered 2014-02-05 – 2014-02-10 (×6): 30 mg via SUBCUTANEOUS
  Filled 2014-02-05 (×7): qty 0.3

## 2014-02-05 MED ORDER — ONDANSETRON HCL 4 MG/2ML IJ SOLN: 4.0000 mg | Freq: Four times a day (QID) | INTRAMUSCULAR | Status: DC | PRN

## 2014-02-05 MED ORDER — ASPIRIN EC 81 MG PO TBEC
81.0000 mg | DELAYED_RELEASE_TABLET | Freq: Every day | ORAL | Status: DC
  Administered 2014-02-06 – 2014-02-11 (×6): 81 mg via ORAL
  Filled 2014-02-05 (×7): qty 1

## 2014-02-05 MED ORDER — SODIUM CHLORIDE 0.9 % IJ SOLN
3.0000 mL | Freq: Two times a day (BID) | INTRAMUSCULAR | Status: DC
  Administered 2014-02-05: 3 mL via INTRAVENOUS

## 2014-02-05 MED ORDER — SODIUM CHLORIDE 0.9 % IV SOLN: 250.0000 mL | INTRAVENOUS | Status: DC | PRN

## 2014-02-05 MED ORDER — SIMVASTATIN 20 MG PO TABS
20.0000 mg | ORAL_TABLET | Freq: Every day | ORAL | Status: DC
  Administered 2014-02-05 – 2014-02-10 (×6): 20 mg via ORAL
  Filled 2014-02-05 (×7): qty 1

## 2014-02-05 MED ORDER — ACETAMINOPHEN 325 MG PO TABS
650.0000 mg | ORAL_TABLET | ORAL | Status: DC | PRN
  Administered 2014-02-06 – 2014-02-10 (×3): 650 mg via ORAL
  Filled 2014-02-05 (×3): qty 2

## 2014-02-05 MED ORDER — CETYLPYRIDINIUM CHLORIDE 0.05 % MT LIQD
7.0000 mL | Freq: Two times a day (BID) | OROMUCOSAL | Status: DC
  Administered 2014-02-05 – 2014-02-11 (×9): 7 mL via OROMUCOSAL

## 2014-02-05 MED ORDER — ALLOPURINOL 100 MG PO TABS
100.0000 mg | ORAL_TABLET | Freq: Two times a day (BID) | ORAL | Status: DC
  Administered 2014-02-05 – 2014-02-11 (×12): 100 mg via ORAL
  Filled 2014-02-05 (×13): qty 1

## 2014-02-05 MED ORDER — VANCOMYCIN HCL IN DEXTROSE 750-5 MG/150ML-% IV SOLN
750.0000 mg | INTRAVENOUS | Status: DC
  Filled 2014-02-05: qty 150

## 2014-02-05 MED ORDER — SIMVASTATIN 20 MG PO TABS
20.0000 mg | ORAL_TABLET | Freq: Every evening | ORAL | Status: DC
Start: 2014-02-05 — End: 2014-02-05
  Filled 2014-02-05: qty 1

## 2014-02-05 MED ORDER — DEXTROSE 5 % IV SOLN
1.0000 g | INTRAVENOUS | Status: DC
  Administered 2014-02-06: 1 g via INTRAVENOUS
  Filled 2014-02-05 (×3): qty 1

## 2014-02-05 MED ORDER — ALLOPURINOL 100 MG PO TABS
200.0000 mg | ORAL_TABLET | Freq: Every day | ORAL | Status: DC
  Filled 2014-02-05: qty 2

## 2014-02-05 MED ORDER — INSULIN ASPART 100 UNIT/ML ~~LOC~~ SOLN
0.0000 [IU] | Freq: Three times a day (TID) | SUBCUTANEOUS | Status: DC
  Administered 2014-02-05: 1 [IU] via SUBCUTANEOUS

## 2014-02-05 MED ORDER — FUROSEMIDE 10 MG/ML IJ SOLN
40.0000 mg | Freq: Two times a day (BID) | INTRAMUSCULAR | Status: DC
  Administered 2014-02-06: 40 mg via INTRAVENOUS
  Filled 2014-02-05 (×3): qty 4

## 2014-02-05 MED ORDER — SODIUM CHLORIDE 0.9 % IJ SOLN: 3.0000 mL | INTRAMUSCULAR | Status: DC | PRN

## 2014-02-05 MED ORDER — FUROSEMIDE 10 MG/ML IJ SOLN
80.0000 mg | Freq: Once | INTRAMUSCULAR | Status: AC
  Administered 2014-02-05: 80 mg via INTRAVENOUS
  Filled 2014-02-05: qty 8

## 2014-02-05 MED ORDER — FUROSEMIDE 10 MG/ML IJ SOLN
INTRAMUSCULAR | Status: AC
  Filled 2014-02-05: qty 4

## 2014-02-05 NOTE — H&P (Signed)
[facility] History and Physical  Doris Howell [id number] DOB: 11/18/1935 DOA: 02/05/2014  Referring physician: EDP PCP: Irven Shelling, MD   Chief Complaint: sob and orthopnea since 5 days.   HPI: Doris Howell is a 78 y.o. female recently discharged from the hospital after being treated for pneumonia comes back for worsening sob on exertion, associated with orthopnea and pedal edema. She was seen in the doctors office and sent to ED for evaluation. Her CXR revealed pulmonary edema vs multi focal pneumonia. She is then referred to medical service for admission. She denies any other complaints. She denies having diabetes and is refusing cbg's. She reported that she was on lasix 40 mg daily but the dose was cut to 20 mg daily and later on discontinued for renal insufficiency. She will be admitte dto telemetry and started on IV lasix.     Review of Systems:  Constitutional:  No weight loss, night sweats, Fevers, chills, fatigue.  HEENT:  No headaches, Difficulty swallowing,Tooth/dental problems,Sore throat,  No sneezing, itching, ear ache, nasal congestion, post nasal drip,  Cardio-vascular:  Sob, orthopnea present.  GI:  No heartburn, indigestion, abdominal pain, nausea, vomiting, diarrhea, change in bowel habits, loss of appetite  Resp:  Sob on exertion, mild dry cough.   Skin:  no rash or lesions.  GU:  no dysuria, change in color of urine, no urgency or frequency. No flank pain.  Musculoskeletal:  Pedal edema present.   Past Medical History  Diagnosis Date  . Hypertension   . High cholesterol   . Kidney stones   . Type II diabetes mellitus   . GERD (gastroesophageal reflux disease)   . History of gout   . CAP (community acquired pneumonia) 01/09/2014    "first time I've ever had it"   Past Surgical History  Procedure Laterality Date  . Kidney stone surgery  10-05-12    "cut me on the side"  . Colonoscopy with propofol N/A 10/23/2012   Procedure: COLONOSCOPY WITH PROPOFOL;  Surgeon: [name], MD;  Location: [facility];  Service: Endoscopy;  Laterality: N/A;  . Abdominal hysterectomy  ~ 1974  . Lithotripsy  1980's?  . Cataract extraction w/ intraocular lens  implant, bilateral Bilateral 2014   Social History:  reports that she has never smoked. She has never used smokeless tobacco. She reports that she does not drink alcohol or use illicit drugs.  Allergies  Allergen Reactions  . Codeine Other (See Comments)    Unknown; pt can't remember. It was in the '70s    History reviewed. No pertinent family history.   Prior to Admission medications   Medication Sig Start Date End Date Taking? Authorizing Provider  albuterol (PROVENTIL HFA;VENTOLIN HFA) 108 (90 BASE) MCG/ACT inhaler Inhale 2 puffs into the lungs every 6 (six) hours as needed for wheezing or shortness of breath. 01/13/14  Yes [name], MD  allopurinol (ZYLOPRIM) 100 MG tablet Take 200 mg by mouth daily.   Yes Historical Provider, MD  amLODipine (NORVASC) 10 MG tablet Take 10 mg by mouth every morning.    Yes Historical Provider, MD  aspirin EC 81 MG tablet Take 81 mg by mouth daily.   Yes Historical Provider, MD  bimatoprost (LUMIGAN) 0.01 % SOLN Place 1 drop into both eyes at bedtime.    Yes Historical Provider, MD  carvedilol (COREG) 12.5 MG tablet Take 12.5 mg by mouth 2 (two) times daily with a meal.   Yes Historical Provider, MD  cholecalciferol (VITAMIN  D) 1000 UNITS tablet Take 1,000 Units by mouth daily.   Yes Historical Provider, MD  furosemide (LASIX) 80 MG tablet Take 40 mg by mouth daily as needed (swelling).   Yes Historical Provider, MD  glimepiride (AMARYL) 1 MG tablet Take 1 mg by mouth daily before breakfast.   Yes Historical Provider, MD  simvastatin (ZOCOR) 20 MG tablet Take 20 mg by mouth every evening.   Yes Historical Provider, MD   Physical Exam: Filed Vitals:   02/05/14 1352 02/05/14 1358 02/05/14 1502 02/05/14 1659  BP:   147/63 144/63 138/62  Pulse:  75 76 76  Temp:    97.6 F (36.4 C)  TempSrc:    Oral  Resp:  18 25 22   Height:    5\' 1"  (1.549 m)  Weight:    94.121 kg (207 lb 8 oz)  SpO2: 97% 98% 96% 100%    Wt Readings from Last 3 Encounters:  02/05/14 94.121 kg (207 lb 8 oz)  01/13/14 95.346 kg (210 lb 3.2 oz)  10/23/12 89.812 kg (198 lb)    General:  Appears calm and comfortable Eyes: PERRL, normal lids, irises & conjunctiva Neck: no LAD, masses or thyromegaly Cardiovascular: RRR, no m/r/g.1+ pedal edema edema. Respiratory: scattered rales no wheezing heard.  Abdomen: soft, ntnd Skin: no rash or induration seen on limited exam Musculoskeletal: grossly normal tone BUE/BLE Neurologic: grossly non-focal.          Labs on Admission:  Basic Metabolic Panel:  Recent Labs Lab 02/05/14 1216  NA 142  K 4.3  CL 107  CO2 18*  GLUCOSE 101*  BUN 38*  CREATININE 1.82*  CALCIUM 9.9   Liver Function Tests: No results found for this basename: AST, ALT, ALKPHOS, BILITOT, PROT, ALBUMIN,  in the last 168 hours No results found for this basename: LIPASE, AMYLASE,  in the last 168 hours No results found for this basename: AMMONIA,  in the last 168 hours CBC:  Recent Labs Lab 02/05/14 1216  WBC 7.5  HGB 9.6*  HCT 29.5*  MCV 91.3  PLT 181   Cardiac Enzymes: No results found for this basename: CKTOTAL, CKMB, CKMBINDEX, TROPONINI,  in the last 168 hours  BNP (last 3 results)  Recent Labs  01/09/14 1515 02/05/14 1216  PROBNP 2595.0* 2767.0*   CBG:  Recent Labs Lab 02/05/14 1722  GLUCAP 143*    Radiological Exams on Admission: Dg Chest 2 View  02/05/2014   ADDENDUM REPORT: 02/05/2014 13:20  ADDENDUM: Comparison is made with chest x-ray dated 01/09/2014.   Electronically Signed   By: Kathreen Devoid   On: 02/05/2014 13:20   02/05/2014   CLINICAL DATA:  Shortness of breath for 2 days  EXAM: CHEST  2 VIEW  COMPARISON:  None.  FINDINGS: There is patchy interstitial and alveolar  airspace opacities involving the upper and lower lungs, right greater than left. There is a small right pleural effusion. There is no significant left pleural effusion. There is no pneumothorax. There is stable cardiomegaly.  The osseous structures are unremarkable.  IMPRESSION: 1. Bilateral interstitial and alveolar airspace opacities with a small right pleural effusion and cardiomegaly. The overall appearance is concerning for pulmonary edema versus multilobar pneumonia.  Electronically Signed: By: Kathreen Devoid On: 02/05/2014 13:17    EKG: sinus rhythm with PVC'S  Assessment/Plan Active Problems:   Acute respiratory failure with hypoxia   CHF (congestive heart failure)   CKD (chronic kidney disease), stage III  Acute respiratory failure with hypoxia  probably secondary to ? ACUTE congestive heart failure vs multifocal pneumonia: Admitted to telemetry and started on her IV lasix. Daily weights and strict intake and output. Recent echocardiogram with good LVef, ? Diastolic dysfunction . Pedal edema seen on exam and 2 pillow orthopnea. Watch renal function on lasix and supplement potassium as needed. Not on ACE or ARB as her LVEF is normal. Resume coreg and aspirin and zocor.   Also started her on IV vancomycin and cefepime for possible HCAP.   Diabetes Mellitus: hgba1c and SSI. Holding glimepiride.   ? CKD: her renal function is better when compared to last admission. US RENAL will be ordered and UA, and urine electrolytes .    DVT prophylaxis.     Code Status: full code.  DVT Prophylaxis: Family Communication: none at bedside Disposition Plan:admit to tele  Time spent: 7 min  [facility] Pager [phone]

## 2014-02-05 NOTE — Progress Notes (Addendum)
ANTIBIOTIC CONSULT NOTE - INITIAL  Pharmacy Consult for Cefepime and Vancomycin Indication: rule out pneumonia  Allergies  Allergen Reactions  . Codeine Other (See Comments)    Unknown; pt can't remember. It was in the '70s    Patient Measurements: Height: 5\' 1"  (154.9 cm) (scale C) Weight: 207 lb 8 oz (94.121 kg) (scale C) IBW/kg (Calculated) : 47.8 Adjusted Body Weight: 66 kg  Vital Signs: Temp: 97.6 F (36.4 C) (10/28 1659) Temp Source: Oral (10/28 1659) BP: 138/62 mmHg (10/28 1659) Pulse Rate: 76 (10/28 1659) Intake/Output from previous day:   Intake/Output from this shift:    Labs:  Recent Labs  02/05/14 1216  WBC 7.5  HGB 9.6*  PLT 181  CREATININE 1.82*   Estimated Creatinine Clearance: 27.5 ml/min (by C-G formula based on Cr of 1.82). No results found for this basename: VANCOTROUGH, [place]lis Leak, VANCORANDOM, [place], GENTPEAK, [place], [place], [place], TOBRARND, AMIKACINPEAK, AMIKACINTROU, AMIKACIN,  in the last 72 hours   Microbiology: Recent Results (from the past 720 hour(s))  CULTURE, BLOOD (ROUTINE X 2)     Status: None   Collection Time    01/09/14  6:55 PM      Result Value Ref Range Status   Specimen Description BLOOD RIGHT HAND   Final   Special Requests BOTTLES DRAWN AEROBIC AND ANAEROBIC 5CC   Final   Culture  Setup Time     Final   Value: 01/10/2014 00:51     Performed at [facility]     Final   Value: NO GROWTH 5 DAYS     Performed at [facility]   Report Status 01/15/2014 FINAL   Final  CULTURE, BLOOD (ROUTINE X 2)     Status: None   Collection Time    01/09/14  7:05 PM      Result Value Ref Range Status   Specimen Description BLOOD HAND LEFT   Final   Special Requests BOTTLES DRAWN AEROBIC ONLY [place]   Final   Culture  Setup Time     Final   Value: 01/10/2014 00:53     Performed at [place]     Final   Value: NO GROWTH 5 DAYS     Performed at [facility]   Report Status 01/16/2014 FINAL   Final  URINE CULTURE     Status: None   Collection Time    01/09/14 10:50 PM      Result Value Ref Range Status   Specimen Description URINE, CLEAN CATCH   Final   Special Requests NONE   Final   Culture  Setup Time     Final   Value: 01/10/2014 08:55     Performed at [facility]     Final   Value: NO GROWTH     Performed at [facility]   Culture     Final   Value: NO GROWTH     Performed at [facility]   Report Status 01/11/2014 FINAL   Final    Medical History: Past Medical History  Diagnosis Date  . Hypertension   . High cholesterol   . Kidney stones   . Type II diabetes mellitus   . GERD (gastroesophageal reflux disease)   . History of gout   . CAP (community acquired pneumonia) 01/09/2014    "first time I've ever had it"    Medications:  Prescriptions prior to admission  Medication  Sig Dispense Refill  . albuterol (PROVENTIL HFA;VENTOLIN HFA) 108 (90 BASE) MCG/ACT inhaler Inhale 2 puffs into the lungs every 6 (six) hours as needed for wheezing or shortness of breath.  1 Inhaler  0  . allopurinol (ZYLOPRIM) 100 MG tablet Take 200 mg by mouth daily.      [name] amLODipine (NORVASC) 10 MG tablet Take 10 mg by mouth every morning.       [name] aspirin EC 81 MG tablet Take 81 mg by mouth daily.      . bimatoprost (LUMIGAN) 0.01 % SOLN Place 1 drop into both eyes at bedtime.       . carvedilol (COREG) 12.5 MG tablet Take 12.5 mg by mouth 2 (two) times daily with a meal.      . cholecalciferol (VITAMIN D) 1000 UNITS tablet Take 1,000 Units by mouth daily.      . furosemide (LASIX) 80 MG tablet Take 40 mg by mouth daily as needed (swelling).      [name] glimepiride (AMARYL) 1 MG tablet Take 1 mg by mouth daily before breakfast.      . simvastatin (ZOCOR) 20 MG tablet Take 20 mg by mouth every evening.       Scheduled:  . allopurinol  200 mg Oral Daily  . aspirin EC  81 mg Oral Daily  . carvedilol  12.5 mg Oral  BID WC  . ceFEPime (MAXIPIME) IV  1 g Intravenous Q24H  . enoxaparin (LOVENOX) injection  30 mg Subcutaneous Q24H  . insulin aspart  0-9 Units Subcutaneous TID WC  . simvastatin  20 mg Oral QPM  . sodium chloride  3 mL Intravenous Q12H   Assessment: 78yo female presents with SOB and DOE starting in the past 5 days. Pharmacy is consulted to dose Cefepime and Vancomycin for HCAP. Pt had been discharge from [facility] on cefdinir and azithromycin for CAP 01/13/14. Pt is afebrile, WBC wnl, sCr 1.82 with CrCl ~93mL/min.   Goal of Therapy:  Vancomycin trough level 15-20 mcg/ml  Plan:  Start Cefepime 1g IV q24h Start Vancomycin 1000mg  IV load followed by 750mg  IV q24h Measure antibiotic drug levels at steady state Follow up culture results and renal function  Andrey Cota. Diona Foley, PharmD Clinical Pharmacist Pager [phone] 02/05/2014,5:09 PM

## 2014-02-05 NOTE — ED Notes (Signed)
Pt reports having sob since yesterday, was sent here from dr griffins office and was told she was "retaining fluid" but denies hx of chf. Swelling noted to feet. ekg done at triage, spo2 92%.

## 2014-02-05 NOTE — Progress Notes (Signed)
Patient arrived on unit from ED, vital signs stable, on 2L via nasal cannula.  Patient alert and oriented x4, denies any questions or concerns at this time.  Will continue to monitor.

## 2014-02-05 NOTE — ED Provider Notes (Signed)
CSN: [id number]     Arrival date & time 02/05/14  1153 History   First MD Initiated Contact with Patient 02/05/14 1206     Chief Complaint  Patient presents with  . Shortness of Breath     (Consider location/radiation/quality/duration/timing/severity/associated sxs/prior Treatment) HPI Complains of shortness of breath worse with exertion onset possibly 5 days ago. Symptoms also worse with lying supine improved with remaining still or sitting upright. No chest pain no fever no cough no other associated symptoms. Seen by Dr. [name] today sent here for further evaluation. No chest pain no other associated symptoms. Past Medical History  Diagnosis Date  . Hypertension   . High cholesterol   . Kidney stones   . Type II diabetes mellitus   . GERD (gastroesophageal reflux disease)   . History of gout   . CAP (community acquired pneumonia) 01/09/2014    "first time I've ever had it"   Past Surgical History  Procedure Laterality Date  . Kidney stone surgery  10-05-12    "cut me on the side"  . Colonoscopy with propofol N/A 10/23/2012    Procedure: COLONOSCOPY WITH PROPOFOL;  Surgeon: [name], MD;  Location: [facility];  Service: Endoscopy;  Laterality: N/A;  . Abdominal hysterectomy  ~ 1974  . Lithotripsy  1980's?  . Cataract extraction w/ intraocular lens  implant, bilateral Bilateral 2014   History reviewed. No pertinent family history. History  Substance Use Topics  . Smoking status: Never Smoker   . Smokeless tobacco: Never Used  . Alcohol Use: No   OB History   Grav Para Term Preterm Abortions TAB SAB Ect Mult Living                 Review of Systems  Constitutional: Negative.   HENT: Negative.   Respiratory: Positive for shortness of breath.   Cardiovascular: Positive for leg swelling.  Gastrointestinal: Negative.   Musculoskeletal: Negative.   Skin: Negative.   Neurological: Negative.   Psychiatric/Behavioral: Negative.   All other systems reviewed and  are negative.     Allergies  Codeine  Home Medications   Prior to Admission medications   Medication Sig Start Date End Date Taking? Authorizing Provider  acetaminophen (TYLENOL) 500 MG tablet Take 500 mg by mouth every 6 (six) hours as needed for pain.    Historical Provider, MD  albuterol (PROVENTIL HFA;VENTOLIN HFA) 108 (90 BASE) MCG/ACT inhaler Inhale 2 puffs into the lungs every 6 (six) hours as needed for wheezing or shortness of breath. 01/13/14   [name], MD  allopurinol (ZYLOPRIM) 100 MG tablet Take 200 mg by mouth daily.    Historical Provider, MD  amLODipine (NORVASC) 10 MG tablet Take 10 mg by mouth every morning.     Historical Provider, MD  aspirin EC 81 MG tablet Take 81 mg by mouth daily.    Historical Provider, MD  azithromycin (ZITHROMAX) 500 MG tablet Take 1 tablet (500 mg total) by mouth daily. 01/14/14   [name], MD  bimatoprost (LUMIGAN) 0.01 % SOLN Place 1 drop into both eyes at bedtime.     Historical Provider, MD  carvedilol (COREG) 12.5 MG tablet Take 12.5 mg by mouth 2 (two) times daily with a meal.    Historical Provider, MD  cefdinir (OMNICEF) 300 MG capsule Take 1 capsule (300 mg total) by mouth daily. 01/13/14   [name], MD  cholecalciferol (VITAMIN D) 1000 UNITS tablet Take 1,000 Units by mouth daily.    Historical Provider,  MD  glimepiride (AMARYL) 1 MG tablet Take 1 mg by mouth daily before breakfast.    Historical Provider, MD  simvastatin (ZOCOR) 20 MG tablet Take 20 mg by mouth every evening.    Historical Provider, MD   BP 139/84  Pulse 76  Temp(Src) 97.6 F (36.4 C) (Oral)  Resp 18  Ht 5\' 1"  (1.549 m)  Wt 208 lb (94.348 kg)  BMI 39.32 kg/m2  SpO2 92% Physical Exam  Nursing note and vitals reviewed. Constitutional: She appears well-developed and well-nourished. No distress.  Speaks in pragrapghs  No respiratory distress  HENT:  Head: Normocephalic and atraumatic.  Eyes: Conjunctivae are normal. Pupils are equal, round, and  reactive to light.  Neck: Neck supple. JVD present. No tracheal deviation present. No thyromegaly present.  Cardiovascular: Normal rate and regular rhythm.   No murmur heard. Pulmonary/Chest: Effort normal. She has rales.  Rales at bases bilaterally  Abdominal: Soft. Bowel sounds are normal. She exhibits no distension. There is no tenderness.  Musculoskeletal: Normal range of motion. She exhibits edema. She exhibits no tenderness.  1+ pretibial pitting edema bilaterally  Neurological: She is alert. Coordination normal.  Skin: Skin is warm and dry. No rash noted.  Psychiatric: She has a normal mood and affect.    ED Course  Procedures (including critical care time) Labs Review Labs Reviewed  [place], ED    Imaging Review No results found.   EKG Interpretation   Date/Time:  Wednesday February 05 2014 12:01:45 EDT Ventricular Rate:  80 PR Interval:  152 QRS Duration: 80 QT Interval:  404 QTC Calculation: 465 R Axis:   74 Text Interpretation:  Sinus rhythm with frequent Premature ventricular  complexes Nonspecific T wave abnormality Abnormal ECG No significant  change since last tracing Confirmed by Winfred Leeds  MD, Karsten Vaughn [phone]) on  02/05/2014 12:07:43 PM     Results for orders placed during the hospital encounter of 02/05/14  CBC      Result Value Ref Range   WBC 7.5  4.0 - 10.5 K/uL   RBC 3.23 (*) 3.87 - 5.11 MIL/uL   Hemoglobin 9.6 (*) 12.0 - 15.0 g/dL   HCT 29.5 (*) 36.0 - 46.0 %   MCV 91.3  78.0 - 100.0 fL   MCH 29.7  26.0 - 34.0 pg   MCHC 32.5  30.0 - 36.0 g/dL   RDW 14.8  11.5 - 15.5 %   Platelets 181  150 - 400 K/uL  BASIC METABOLIC PANEL      Result Value Ref Range   Sodium 142  137 - 147 mEq/L   Potassium 4.3  3.7 - 5.3 mEq/L   Chloride 107  96 - 112 mEq/L   CO2 18 (*) 19 - 32 mEq/L   Glucose, Bld 101 (*) 70 - 99 mg/dL   BUN 38 (*) 6 - 23 mg/dL   Creatinine, Ser 1.82 (*) 0.50 - 1.10 mg/dL    Calcium 9.9  8.4 - 10.5 mg/dL   GFR calc non Af Amer 26 (*) >90 mL/min   GFR calc Af Amer 30 (*) >90 mL/min   Anion gap 17 (*) 5 - 15  PRO B NATRIURETIC PEPTIDE      Result Value Ref Range   Pro B Natriuretic peptide (BNP) 2767.0 (*) 0 - 450 pg/mL  I-STAT TROPOININ, ED      Result Value Ref Range   Troponin i, poc 0.03  0.00 -  0.08 ng/mL   Comment 3            Dg Chest 2 View  02/05/2014   ADDENDUM REPORT: 02/05/2014 13:20  ADDENDUM: Comparison is made with chest x-ray dated 01/09/2014.   Electronically Signed   By: Kathreen Devoid   On: 02/05/2014 13:20   02/05/2014   CLINICAL DATA:  Shortness of breath for 2 days  EXAM: CHEST  2 VIEW  COMPARISON:  None.  FINDINGS: There is patchy interstitial and alveolar airspace opacities involving the upper and lower lungs, right greater than left. There is a small right pleural effusion. There is no significant left pleural effusion. There is no pneumothorax. There is stable cardiomegaly.  The osseous structures are unremarkable.  IMPRESSION: 1. Bilateral interstitial and alveolar airspace opacities with a small right pleural effusion and cardiomegaly. The overall appearance is concerning for pulmonary edema versus multilobar pneumonia.  Electronically Signed: By: Kathreen Devoid On: 02/05/2014 13:17   Dg Chest 2 View  01/09/2014   CLINICAL DATA:  Persistent shortness of breath.  EXAM: CHEST  2 VIEW  COMPARISON:  07/03/2013.  FINDINGS: Mildly enlarged cardiac silhouette with a mild increase in size. Stable small amount of linear scarring on the left. Interval patchy opacity in the right lower lobe. Small amount of patchy opacity in the left lower lobe and right perihilar region. Stable mildly prominent interstitial markings, diffuse osteopenia and thoracic spine degenerative changes, including changes of DISH. The hemidiaphragms are flattened on the lateral view.  IMPRESSION: 1. Bilateral patchy opacity, as described above, suspicious for pneumonia. 2. Stable  mild changes of COPD. 3. Mild cardiomegaly with mild progression   Electronically Signed   By: Enrique Sack M.D.   On: 01/09/2014 16:54   US Renal  01/11/2014   CLINICAL DATA:  Acute renal failure; BUN of 47 and creatinine 2.37; previous left nephrectomy ; history of kidney stones, hypertension, and diabetes  EXAM: RENAL/URINARY TRACT ULTRASOUND COMPLETE  COMPARISON:  MRI of the abdomen of October 29, 2013.  FINDINGS: Right Kidney:  Length: 11.8 cm. The cortical echotexture is mildly increased and exceeds that of the adjacent liver on some images. There is no hydronephrosis. There is no focal mass. No echogenic shadowing stones are demonstrated.  Left Kidney:  The left kidney is surgically absent.  Bladder:  Appears normal for degree of bladder distention.  IMPRESSION: The echotexture of the right kidney is increased consistent with medical renal disease. There is no hydronephrosis. The urinary bladder is unremarkable. The left kidney is surgically absent.   Electronically Signed   By: David  Martinique   On: 01/11/2014 16:59    MDM  Spoke with Dr.Akula and telemetry, nitrates, diuretics. Final diagnoses:  Shortness of breath   Dx #1 congestive heart failure #2 chronic renal insufficiency #3 anemia     Orlie Dakin, MD 02/05/14 1425

## 2014-02-06 DIAGNOSIS — N183 Chronic kidney disease, stage 3 (moderate): Secondary | ICD-10-CM

## 2014-02-06 DIAGNOSIS — I5031 Acute diastolic (congestive) heart failure: Secondary | ICD-10-CM

## 2014-02-06 LAB — GLUCOSE, CAPILLARY
Glucose-Capillary: 83 mg/dL (ref 70–99)
Glucose-Capillary: 113 mg/dL — ABNORMAL HIGH (ref 70–99)
Glucose-Capillary: 138 mg/dL — ABNORMAL HIGH (ref 70–99)
Glucose-Capillary: 176 mg/dL — ABNORMAL HIGH (ref 70–99)

## 2014-02-06 LAB — BASIC METABOLIC PANEL
Anion gap: 12 (ref 5–15)
BUN: 45 mg/dL — ABNORMAL HIGH (ref 6–23)
CO2: 23 mEq/L (ref 19–32)
Calcium: 9.2 mg/dL (ref 8.4–10.5)
Chloride: 107 mEq/L (ref 96–112)
Creatinine, Ser: 2.22 mg/dL — ABNORMAL HIGH (ref 0.50–1.10)
GFR calc Af Amer: 24 mL/min — ABNORMAL LOW (ref >90)
GFR calc non Af Amer: 20 mL/min — ABNORMAL LOW (ref >90)
Glucose, Bld: 99 mg/dL (ref 70–99)
Potassium: 4.3 mEq/L (ref 3.7–5.3)
Sodium: 142 mEq/L (ref 137–147)

## 2014-02-06 LAB — HEMOGLOBIN A1C
Hgb A1c MFr Bld: 6.5 % — ABNORMAL HIGH (ref <5.7)
Mean Plasma Glucose: 140 mg/dL — ABNORMAL HIGH (ref <117)

## 2014-02-06 MED ORDER — INSULIN ASPART 100 UNIT/ML ~~LOC~~ SOLN
0.0000 [IU] | Freq: Three times a day (TID) | SUBCUTANEOUS | Status: DC
  Administered 2014-02-07: 1 [IU] via SUBCUTANEOUS
  Administered 2014-02-08 (×2): 2 [IU] via SUBCUTANEOUS
  Administered 2014-02-09: 3 [IU] via SUBCUTANEOUS
  Administered 2014-02-10: 2 [IU] via SUBCUTANEOUS
  Administered 2014-02-10: 1 [IU] via SUBCUTANEOUS

## 2014-02-06 MED ORDER — FUROSEMIDE 10 MG/ML IJ SOLN
80.0000 mg | Freq: Two times a day (BID) | INTRAMUSCULAR | Status: DC
  Administered 2014-02-06 – 2014-02-08 (×4): 80 mg via INTRAVENOUS
  Filled 2014-02-06 (×5): qty 8

## 2014-02-06 MED ORDER — VANCOMYCIN HCL IN DEXTROSE 1-5 GM/200ML-% IV SOLN: 1000.0000 mg | INTRAVENOUS | Status: DC

## 2014-02-06 NOTE — Progress Notes (Signed)
ANTIBIOTIC CONSULT NOTE - FOLLOW UP  Pharmacy Consult:  Vancomycin Indication:  Possible HCAP  Allergies  Allergen Reactions  . Codeine Other (See Comments)    Unknown; pt can't remember. It was in the '70s    Patient Measurements: Height: 5\' 1"  (154.9 cm) (scale C) Weight: 203 lb 11.3 oz (92.4 kg) IBW/kg (Calculated) : 47.8  Vital Signs: Temp: 98.5 F (36.9 C) (10/29 0606) Temp Source: Oral (10/29 0606) BP: 120/51 mmHg (10/29 0606) Pulse Rate: 63 (10/29 0606) Intake/Output from previous day: 10/28 0701 - 10/29 0700 In: 240 [P.O.:240] Out: 1200 [Urine:1200]  Labs:  Recent Labs  02/05/14 1216 02/05/14 1912 02/06/14 0525  WBC 7.5  --   --   HGB 9.6*  --   --   PLT 181  --   --   LABCREA  --  24.34  --   CREATININE 1.82*  --  2.22*   Estimated Creatinine Clearance: 22.3 ml/min (by C-G formula based on Cr of 2.22). No results found for this basename: VANCOTROUGH, VANCOPEAK, VANCORANDOM, GENTTROUGH, GENTPEAK, GENTRANDOM, [place], TOBRAPEAK, TOBRARND, AMIKACINPEAK, AMIKACINTROU, AMIKACIN,  in the last 72 hours   Microbiology: Recent Results (from the past 720 hour(s))  CULTURE, BLOOD (ROUTINE X 2)     Status: None   Collection Time    01/09/14  6:55 PM      Result Value Ref Range Status   Specimen Description BLOOD RIGHT HAND   Final   Special Requests BOTTLES DRAWN AEROBIC AND ANAEROBIC 5CC   Final   Culture  Setup Time     Final   Value: 01/10/2014 00:51     Performed at [facility]   Culture     Final   Value: NO GROWTH 5 DAYS     Performed at [facility]   Report Status 01/15/2014 FINAL   Final  CULTURE, BLOOD (ROUTINE X 2)     Status: None   Collection Time    01/09/14  7:05 PM      Result Value Ref Range Status   Specimen Description BLOOD HAND LEFT   Final   Special Requests BOTTLES DRAWN AEROBIC ONLY [place]   Final   Culture  Setup Time     Final   Value: 01/10/2014 00:53     Performed at [facility]   Culture     Final    Value: NO GROWTH 5 DAYS     Performed at [facility]   Report Status 01/16/2014 FINAL   Final  URINE CULTURE     Status: None   Collection Time    01/09/14 10:50 PM      Result Value Ref Range Status   Specimen Description URINE, CLEAN CATCH   Final   Special Requests NONE   Final   Culture  Setup Time     Final   Value: 01/10/2014 08:55     Performed at [place]     Final   Value: NO GROWTH     Performed at [facility]   Culture     Final   Value: NO GROWTH     Performed at [facility]   Report Status 01/11/2014 FINAL   Final      Assessment: 78yo female presents with SOB and DOE started 5 days PTA. Pharmacy managing cefepime and vancomycin for HCAP.  She had been discharge dfrom [place] on cefdinir and azithromycin for CAP 01/13/14.  Her renal  function is worsening but will adjust vancomycin to attain goal trough.   Vanc 10/29 >> Cefepime 10/29 >>   Goal of Therapy:  Vancomycin trough level 15-20 mcg/ml   Plan:  - Continue Cefepime 1gm IV Q24H - Change vanc to 1000mg  IV Q24H - Monitor renal fxn, clinical progress, vanc trough as indicated    Lindsey Demonte D. Mina Marble, PharmD, BCPS Pager:  [phone] 02/06/2014, 8:37 AM

## 2014-02-06 NOTE — Care Management Note (Signed)
    Page 1 of 1   02/06/2014     2:47:45 PM CARE MANAGEMENT NOTE 02/06/2014  Patient:  Doris Howell, Doris Howell   Account Number:  [id number]  Date Initiated:  02/06/2014  Documentation initiated by:  [facility]  Subjective/Objective Assessment:   78 y.o. female recently discharged from the hospital after being treated for pneumonia comes back for worsening sob on exertion, associated with orthopnea and pedal edema.//Home with spouse.     Action/Plan:   IV lasix. Daily weights and strict intake and output.//Access for disposition needs.   Anticipated DC Date:  02/09/2014   Anticipated DC Plan:  [place]  CM consult      Choice offered to / List presented to:             Status of service:  In process, will continue to follow Medicare Important Message given?   (If response is "NO", the following Medicare IM given date fields will be blank) Date Medicare IM given:   Medicare IM given by:   Date Additional Medicare IM given:   Additional Medicare IM given by:    Discharge Disposition:    Per UR Regulation:  Reviewed for med. necessity/level of care/duration of stay  If discussed at [place] of Stay Meetings, dates discussed:    Comments:

## 2014-02-06 NOTE — Progress Notes (Signed)
[facility] PROGRESS NOTE  Filed Weights   02/05/14 1159 02/05/14 1659 02/06/14 0606  Weight: 94.348 kg (208 lb) 94.121 kg (207 lb 8 oz) 92.4 kg (203 lb 11.3 oz)        Intake/Output Summary (Last 24 hours) at 02/06/14 0827 Last data filed at 02/05/14 2320  Gross per 24 hour  Intake    240 ml  Output   1200 ml  Net   -960 ml     Assessment/Plan: Acute respiratory failure with hypoxia due Acute Diastolic heart failure: - Her lowest weight in the system was 89 kg I was in 2014, she is a very poor historian. - I try to lay her flat on her bed and she couldn't tolerate that as she was short of breath. Her BNP is mildly elevated at 2700, previous BNP was 2500. - She denies any cough any fever, no sick contacts. - I will go ahead and stop her IV antibiotics. Continue to diurese her aggressively check basic metabolic panels closely. She had a previous echo in 2015 that showed no systolic heart failure. - Monitor strict I's and O's check electrolytes. Restrictive her fluids. - Daily weights.  Diabetes Mellitus:  - hgba1c pending and SSI. Holding glimepiride.   Chronic renal disease stage IV: - She is currently not on any sessile GFR is less than 30. - Continue to monitor renal function.    Status: full code.  DVT Prophylaxis: heparin Family Communication: none at bedside  Disposition Plan:admit to tele    Consultants:  *None  Procedures: ECHO 10.2.2015: Wall thickness was increased in a pattern of mild LVH. Systolic function was normal. The estimated ejection fraction was in the range of 50% to 55%.  Left atrium: The atrium was mildly dilated.  Pulmonary arteries: PA peak pressure: 32 mm Hg.   Antibiotics:  1 dose of Vanco and cefepime.  HPI/Subjective: Patient relates that her shortness of breath is unchanged. She is tangential on her speaking.  Objective: Filed Vitals:   02/05/14 1659 02/05/14 2124 02/06/14 0210 02/06/14 0606  BP: 138/62 142/54 130/52  120/51  Pulse: 76 75 64 63  Temp: 97.6 F (36.4 C) 98.7 F (37.1 C) 98.6 F (37 C) 98.5 F (36.9 C)  TempSrc: Oral Oral Oral Oral  Resp: 22 20 22 21   Height: 5\' 1"  (1.549 m)     Weight: 94.121 kg (207 lb 8 oz)   92.4 kg (203 lb 11.3 oz)  SpO2: 100% 95% 96% 98%     Exam:  General: Alert, awake, oriented x3, in no acute distress.  HEENT: No bruits, no goiter. No appreciated JVD due to body habitus. Heart: Regular rate and rhythm, no lower extremity edema Lungs: Good air movement, crackles in her lungs bilaterally. Abdomen: Soft, some mild tenderness with pressure on her flanks. nondistended, positive bowel sounds.   Data Reviewed: Basic Metabolic Panel:  Recent Labs Lab 02/05/14 1216 02/06/14 0525  NA 142 142  K 4.3 4.3  CL 107 107  CO2 18* 23  GLUCOSE 101* 99  BUN 38* 45*  CREATININE 1.82* 2.22*  CALCIUM 9.9 9.2   Liver Function Tests: No results found for this basename: AST, ALT, ALKPHOS, BILITOT, PROT, ALBUMIN,  in the last 168 hours No results found for this basename: LIPASE, AMYLASE,  in the last 168 hours No results found for this basename: AMMONIA,  in the last 168 hours CBC:  Recent Labs Lab 02/05/14 1216  WBC 7.5  HGB 9.6*  HCT  29.5*  MCV 91.3  PLT 181   Cardiac Enzymes: No results found for this basename: CKTOTAL, CKMB, CKMBINDEX, TROPONINI,  in the last 168 hours BNP (last 3 results)  Recent Labs  01/09/14 1515 02/05/14 1216  PROBNP 2595.0* 2767.0*   CBG:  Recent Labs Lab 02/05/14 1722 02/05/14 2208 02/06/14 0603  GLUCAP 143* 128* 83    No results found for this or any previous visit (from the past 240 hour(s)).   Studies: Dg Chest 2 View  02/05/2014   ADDENDUM REPORT: 02/05/2014 13:20  ADDENDUM: Comparison is made with chest x-ray dated 01/09/2014.   Electronically Signed   By: Kathreen Devoid   On: 02/05/2014 13:20   02/05/2014   CLINICAL DATA:  Shortness of breath for 2 days  EXAM: CHEST  2 VIEW  COMPARISON:  None.  FINDINGS:  There is patchy interstitial and alveolar airspace opacities involving the upper and lower lungs, right greater than left. There is a small right pleural effusion. There is no significant left pleural effusion. There is no pneumothorax. There is stable cardiomegaly.  The osseous structures are unremarkable.  IMPRESSION: 1. Bilateral interstitial and alveolar airspace opacities with a small right pleural effusion and cardiomegaly. The overall appearance is concerning for pulmonary edema versus multilobar pneumonia.  Electronically Signed: By: Kathreen Devoid On: 02/05/2014 13:17   US Renal  02/05/2014   CLINICAL DATA:  Elevated creatinine.  EXAM: RENAL/URINARY TRACT ULTRASOUND COMPLETE  COMPARISON:  01/11/2014  FINDINGS: Right Kidney:  Length: 12.3 cm. Echogenicity appears increased. No mass or hydronephrosis visualized.  Left Kidney:  Length: Status post nephrectomy.  Bladder:  Appears normal for degree of bladder distention.  IMPRESSION: 1. Echogenic right kidney compatible with chronic medical renal disease. 2. No evidence for hydronephrosis.   Electronically Signed   By: Kerby Moors M.D.   On: 02/05/2014 18:54    Scheduled Meds: . allopurinol  100 mg Oral BID  . antiseptic oral rinse  7 mL Mouth Rinse BID  . aspirin EC  81 mg Oral Daily  . carvedilol  12.5 mg Oral BID  . ceFEPime (MAXIPIME) IV  1 g Intravenous Q24H  . enoxaparin (LOVENOX) injection  30 mg Subcutaneous Q24H  . furosemide  40 mg Intravenous Q12H  . insulin aspart  0-9 Units Subcutaneous TID WC  . latanoprost  1 drop Both Eyes QHS  . simvastatin  20 mg Oral QHS  . sodium chloride  3 mL Intravenous Q12H  . vancomycin  750 mg Intravenous Q24H   Continuous Infusions:    [name]  [facility] Pager [phone] If 8PM-8AM, please contact night-coverage at [email or web address], password [facility] 02/06/2014, 8:27 AM  LOS: 1 day

## 2014-02-06 NOTE — Plan of Care (Signed)
Problem: Phase I Progression Outcomes Goal: EF % per last Echo/documented,Core Reminder form on chart Outcome: Completed/Met Date Met:  02/06/14 EF 50-55% per ECHO performed on 01/10/14.     

## 2014-02-07 ENCOUNTER — Inpatient Hospital Stay (HOSPITAL_COMMUNITY): Payer: Medicare Other

## 2014-02-07 DIAGNOSIS — N179 Acute kidney failure, unspecified: Secondary | ICD-10-CM

## 2014-02-07 LAB — BASIC METABOLIC PANEL
Anion gap: 16 — ABNORMAL HIGH (ref 5–15)
BUN: 52 mg/dL — ABNORMAL HIGH (ref 6–23)
CO2: 21 mEq/L (ref 19–32)
Calcium: 9.6 mg/dL (ref 8.4–10.5)
Chloride: 107 mEq/L (ref 96–112)
Creatinine, Ser: 2.41 mg/dL — ABNORMAL HIGH (ref 0.50–1.10)
GFR calc Af Amer: 21 mL/min — ABNORMAL LOW (ref >90)
GFR calc non Af Amer: 18 mL/min — ABNORMAL LOW (ref >90)
Glucose, Bld: 64 mg/dL — ABNORMAL LOW (ref 70–99)
Potassium: 4.3 mEq/L (ref 3.7–5.3)
Sodium: 144 mEq/L (ref 137–147)

## 2014-02-07 LAB — GLUCOSE, CAPILLARY
Glucose-Capillary: 62 mg/dL — ABNORMAL LOW (ref 70–99)
Glucose-Capillary: 90 mg/dL (ref 70–99)
Glucose-Capillary: 145 mg/dL — ABNORMAL HIGH (ref 70–99)
Glucose-Capillary: 67 mg/dL — ABNORMAL LOW (ref 70–99)
Glucose-Capillary: 117 mg/dL — ABNORMAL HIGH (ref 70–99)
Glucose-Capillary: 153 mg/dL — ABNORMAL HIGH (ref 70–99)

## 2014-02-07 LAB — HEPATIC FUNCTION PANEL
ALT: 26 U/L (ref 0–35)
AST: 33 U/L (ref 0–37)
Albumin: 3.4 g/dL — ABNORMAL LOW (ref 3.5–5.2)
Alkaline Phosphatase: 95 U/L (ref 39–117)
Bilirubin, Direct: <0.2 mg/dL (ref 0.0–0.3)
Total Bilirubin: 0.3 mg/dL (ref 0.3–1.2)
Total Protein: 7.5 g/dL (ref 6.0–8.3)

## 2014-02-07 MED ORDER — TECHNETIUM TO 99M ALBUMIN AGGREGATED
6.0000 | Freq: Once | INTRAVENOUS | Status: AC | PRN
  Administered 2014-02-07: 6 via INTRAVENOUS

## 2014-02-07 MED ORDER — TECHNETIUM TC 99M DIETHYLENETRIAME-PENTAACETIC ACID: 40.0000 | Freq: Once | INTRAVENOUS | Status: AC | PRN

## 2014-02-07 NOTE — Evaluation (Signed)
Physical Therapy Evaluation Patient Details Name: Doris Howell MRN: [id number] DOB: Feb 08, 1936 Today's Date: 02/07/2014   History of Present Illness  pt presents with Respiratory Failure and Hypoxia, and HF.  pt with recent admit for the same.    Clinical Impression  Pt fatigues quickly 2/2 feeling SOB.  O2 sats remained 95-96% on 3L O2 throughout session.  Pt would benefit from continue PT to maximize independence prior to returning to home.  Will continue to follow.      Follow Up Recommendations Home health PT;Supervision - Intermittent    Equipment Recommendations  None recommended by PT    Recommendations for Other Services       Precautions / Restrictions Precautions Precautions: Fall Restrictions Weight Bearing Restrictions: No      Mobility  Bed Mobility Overal bed mobility: Modified Independent                Transfers Overall transfer level: Needs assistance Equipment used: None Transfers: Sit to/from Stand Sit to Stand: Supervision         General transfer comment: pt needs UE use.    Ambulation/Gait Ambulation/Gait assistance: Min guard Ambulation Distance (Feet): 50 Feet Assistive device: None (occasional use of rail in hallway.  ) Gait Pattern/deviations: Step-through pattern;Decreased stride length     General Gait Details: pt mildly unsteady and occasionally uses hall railing.  pt on 3L O2 throughout session with sats 95-96%  Stairs            Wheelchair Mobility    Modified Rankin (Stroke Patients Only)       Balance Overall balance assessment: Needs assistance Sitting-balance support: No upper extremity supported;Feet supported Sitting balance-Leahy Scale: Good     Standing balance support: No upper extremity supported Standing balance-Leahy Scale: Fair                               Pertinent Vitals/Pain Pain Assessment: No/denies pain    Home Living Family/patient expects to be discharged to::  Private residence Living Arrangements: Spouse/significant other Available Help at Discharge: Family;Available PRN/intermittently Type of Home: House Home Access: Stairs to enter Entrance Stairs-Rails: None Entrance Stairs-Number of Steps: 1 Home Layout: One level Home Equipment: None Additional Comments: pt indicates her husband has visual deficits.      Prior Function Level of Independence: Independent               Hand Dominance        Extremity/Trunk Assessment   Upper Extremity Assessment: Overall WFL for tasks assessed           Lower Extremity Assessment: Overall WFL for tasks assessed      Cervical / Trunk Assessment: Normal  Communication   Communication: No difficulties  Cognition Arousal/Alertness: Awake/alert Behavior During Therapy: WFL for tasks assessed/performed Overall Cognitive Status: Within Functional Limits for tasks assessed                      General Comments      Exercises        Assessment/Plan    PT Assessment Patient needs continued PT services  PT Diagnosis Difficulty walking   PT Problem List Decreased strength;Decreased activity tolerance;Decreased balance;Decreased mobility;Decreased knowledge of use of DME;Cardiopulmonary status limiting activity  PT Treatment Interventions DME instruction;Gait training;Stair training;Functional mobility training;Therapeutic activities;Therapeutic exercise;Balance training;Patient/family education   PT Goals (Current goals can be found in the Care Plan section) Acute  Rehab PT Goals Patient Stated Goal: Breathe better PT Goal Formulation: With patient Time For Goal Achievement: 02/21/14 Potential to Achieve Goals: Good    Frequency Min 3X/week   Barriers to discharge        Co-evaluation               End of Session Equipment Utilized During Treatment: Oxygen Activity Tolerance: Patient limited by fatigue Patient left: in bed;with call bell/phone within  reach Nurse Communication: Mobility status         Time: 0940-1002 PT Time Calculation (min): 22 min   Charges:   PT Evaluation $Initial PT Evaluation Tier I: 1 Procedure PT Treatments $Gait Training: 8-22 mins   PT G CodesCatarina Howell, [place] 02/07/2014, 10:11 AM

## 2014-02-07 NOTE — Progress Notes (Signed)
Nutrition Brief Note  Patient identified on the Malnutrition Screening Tool (MST) Report  Wt Readings from Last 15 Encounters:  02/07/14 198 lb (89.812 kg)  01/13/14 210 lb 3.2 oz (95.346 kg)  10/23/12 198 lb (89.812 kg)  10/23/12 198 lb (89.812 kg)  06/15/12 198 lb (89.812 kg)    Body mass index is 37.43 kg/(m^2). Patient meets criteria for Obesity based on current BMI.   Current diet order is Renal/Carb Modified, patient is consuming approximately 50-100% of meals at this time. Pt states she is unsure if she has lost weight but, she usually weighs 190 lbs. She describes her appetite as fair and close to normal. She reports eating normally PTA. Labs and medications reviewed.   No nutrition interventions warranted at this time. If nutrition issues arise, please consult RD.   Pryor Ochoa RD, LDN Inpatient Clinical Dietitian Pager: [phone] After Hours Pager: [phone]

## 2014-02-07 NOTE — Progress Notes (Signed)
[facility] PROGRESS NOTE  Filed Weights   02/05/14 1659 02/06/14 0606 02/07/14 0442  Weight: 94.121 kg (207 lb 8 oz) 92.4 kg (203 lb 11.3 oz) 89.812 kg (198 lb)        Intake/Output Summary (Last 24 hours) at 02/07/14 [id number] Last data filed at 02/07/14 0617  Gross per 24 hour  Intake    480 ml  Output   2200 ml  Net  -1720 ml     Assessment/Plan: Acute respiratory failure with hypoxia due Acute Diastolic heart failure: - Her lowest weight in the system was 89 kg, she is a very poor historian. Cont to remain afebrile no leucocytosis. - She relates her breathing is slightly better. - I try to lay her flat on her bed and she couldn't tolerate that as she was short of breath.  - She had a previous echo in 2015 that showed no systolic or diastolic heart failure. BP is actually improving. - Monitor strict I's and O's check electrolytes. Restrictive her fluids. - Daily weights. She is not tachycardic but in a Beta blocker, d-dimer will probably be elevated check a Vq scan. - needs sleep study as an outpatient.  Diabetes Mellitus:  - hgba1c pending and SSI. Holding glimepiride.   Chronic renal disease stage IV: - She is currently not on ACE-I GFR is less than 30. - Continue to monitor renal function.    Status: full code.  DVT Prophylaxis: heparin Family Communication: none at bedside  Disposition Plan:admit to tele    Consultants:  *None  Procedures: ECHO 10.2.2015: Wall thickness was increased in a pattern of mild LVH. Systolic function was normal. The estimated ejection fraction was in the range of 50% to 55%.  Left atrium: The atrium was mildly dilated.  Pulmonary arteries: PA peak pressure: 32 mm Hg.   Antibiotics:  1 dose of Vanco and cefepime.  HPI/Subjective: Patient relates that her shortness of breath is improved.  Objective: Filed Vitals:   02/06/14 1636 02/06/14 2108 02/07/14 0128 02/07/14 0442  BP: 113/72 127/50 120/39 122/44  Pulse: 63 71  65 67  Temp: 98.6 F (37 C) 98 F (36.7 C) 98.6 F (37 C) 98.5 F (36.9 C)  TempSrc: Oral Oral Oral Oral  Resp: 20 18 18 20   Height:      Weight:    89.812 kg (198 lb)  SpO2: 95% 98% 97% 96%     Exam:  General: Alert, awake, oriented x3, in no acute distress.  HEENT: No bruits, no goiter. No appreciated JVD due to body habitus. Heart: Regular rate and rhythm, no lower extremity edema Lungs: Good air movement, crackles in her lungs bilaterally. Abdomen: Soft, some mild tenderness with pressure on her flanks. nondistended, positive bowel sounds.   Data Reviewed: Basic Metabolic Panel:  Recent Labs Lab 02/05/14 1216 02/06/14 0525 02/07/14 0415  NA 142 142 144  K 4.3 4.3 4.3  CL 107 107 107  CO2 18* 23 21  GLUCOSE 101* 99 64*  BUN 38* 45* 52*  CREATININE 1.82* 2.22* 2.41*  CALCIUM 9.9 9.2 9.6   Liver Function Tests: No results found for this basename: AST, ALT, ALKPHOS, BILITOT, PROT, ALBUMIN,  in the last 168 hours No results found for this basename: LIPASE, AMYLASE,  in the last 168 hours No results found for this basename: AMMONIA,  in the last 168 hours CBC:  Recent Labs Lab 02/05/14 1216  WBC 7.5  HGB 9.6*  HCT 29.5*  MCV 91.3  PLT  181   Cardiac Enzymes: No results found for this basename: CKTOTAL, CKMB, CKMBINDEX, TROPONINI,  in the last 168 hours BNP (last 3 results)  Recent Labs  01/09/14 1515 02/05/14 1216  PROBNP 2595.0* 2767.0*   CBG:  Recent Labs Lab 02/06/14 1117 02/06/14 1628 02/06/14 2108 02/07/14 0606 02/07/14 0647  GLUCAP 113* 138* 176* 62* 90    No results found for this or any previous visit (from the past 240 hour(s)).   Studies: Dg Chest 2 View  02/05/2014   ADDENDUM REPORT: 02/05/2014 13:20  ADDENDUM: Comparison is made with chest x-ray dated 01/09/2014.   Electronically Signed   By: Kathreen Devoid   On: 02/05/2014 13:20   02/05/2014   CLINICAL DATA:  Shortness of breath for 2 days  EXAM: CHEST  2 VIEW  COMPARISON:   None.  FINDINGS: There is patchy interstitial and alveolar airspace opacities involving the upper and lower lungs, right greater than left. There is a small right pleural effusion. There is no significant left pleural effusion. There is no pneumothorax. There is stable cardiomegaly.  The osseous structures are unremarkable.  IMPRESSION: 1. Bilateral interstitial and alveolar airspace opacities with a small right pleural effusion and cardiomegaly. The overall appearance is concerning for pulmonary edema versus multilobar pneumonia.  Electronically Signed: By: Kathreen Devoid On: 02/05/2014 13:17   US Renal  02/05/2014   CLINICAL DATA:  Elevated creatinine.  EXAM: RENAL/URINARY TRACT ULTRASOUND COMPLETE  COMPARISON:  01/11/2014  FINDINGS: Right Kidney:  Length: 12.3 cm. Echogenicity appears increased. No mass or hydronephrosis visualized.  Left Kidney:  Length: Status post nephrectomy.  Bladder:  Appears normal for degree of bladder distention.  IMPRESSION: 1. Echogenic right kidney compatible with chronic medical renal disease. 2. No evidence for hydronephrosis.   Electronically Signed   By: Kerby Moors M.D.   On: 02/05/2014 18:54    Scheduled Meds: . allopurinol  100 mg Oral BID  . antiseptic oral rinse  7 mL Mouth Rinse BID  . aspirin EC  81 mg Oral Daily  . carvedilol  12.5 mg Oral BID  . enoxaparin (LOVENOX) injection  30 mg Subcutaneous Q24H  . furosemide  80 mg Intravenous Q12H  . insulin aspart  0-9 Units Subcutaneous TID WC  . latanoprost  1 drop Both Eyes QHS  . simvastatin  20 mg Oral QHS   Continuous Infusions:    [name]  [facility] Pager [phone] If 8PM-8AM, please contact night-coverage at [email or web address], password [facility] 02/07/2014, 9:27 AM  LOS: 2 days

## 2014-02-07 NOTE — Clinical Documentation Improvement (Signed)
  Current BMI 39.32, Ht 5'1", Wt 208. PN 10/29 states "No appreciated JVD due to body habitus" and "no weight loss". Please assign a diagnosis for this condition.   Thank You,  Barrie Dunker RN [place] [phone] HIM department

## 2014-02-08 ENCOUNTER — Inpatient Hospital Stay (HOSPITAL_COMMUNITY): Payer: Medicare Other

## 2014-02-08 DIAGNOSIS — I5032 Chronic diastolic (congestive) heart failure: Secondary | ICD-10-CM

## 2014-02-08 DIAGNOSIS — E875 Hyperkalemia: Secondary | ICD-10-CM | POA: Diagnosis present

## 2014-02-08 DIAGNOSIS — J189 Pneumonia, unspecified organism: Secondary | ICD-10-CM

## 2014-02-08 LAB — BASIC METABOLIC PANEL
Anion gap: 18 — ABNORMAL HIGH (ref 5–15)
BUN: 58 mg/dL — ABNORMAL HIGH (ref 6–23)
CO2: 23 mEq/L (ref 19–32)
Calcium: 9.4 mg/dL (ref 8.4–10.5)
Chloride: 104 mEq/L (ref 96–112)
Creatinine, Ser: 2.75 mg/dL — ABNORMAL HIGH (ref 0.50–1.10)
GFR calc Af Amer: 18 mL/min — ABNORMAL LOW (ref >90)
GFR calc non Af Amer: 16 mL/min — ABNORMAL LOW (ref >90)
Glucose, Bld: 93 mg/dL (ref 70–99)
Potassium: 5.5 mEq/L — ABNORMAL HIGH (ref 3.7–5.3)
Sodium: 145 mEq/L (ref 137–147)

## 2014-02-08 LAB — GLUCOSE, CAPILLARY
Glucose-Capillary: 92 mg/dL (ref 70–99)
Glucose-Capillary: 181 mg/dL — ABNORMAL HIGH (ref 70–99)
Glucose-Capillary: 160 mg/dL — ABNORMAL HIGH (ref 70–99)
Glucose-Capillary: 152 mg/dL — ABNORMAL HIGH (ref 70–99)

## 2014-02-08 MED ORDER — VANCOMYCIN HCL IN DEXTROSE 1-5 GM/200ML-% IV SOLN
1000.0000 mg | INTRAVENOUS | Status: DC
  Administered 2014-02-08: 1000 mg via INTRAVENOUS
  Filled 2014-02-08 (×2): qty 200

## 2014-02-08 MED ORDER — DEXTROSE 5 % IV SOLN
1.0000 g | INTRAVENOUS | Status: DC
  Administered 2014-02-08 – 2014-02-09 (×2): 1 g via INTRAVENOUS
  Filled 2014-02-08 (×3): qty 1

## 2014-02-08 MED ORDER — SODIUM CHLORIDE 0.9 % IV BOLUS (SEPSIS)
1000.0000 mL | Freq: Once | INTRAVENOUS | Status: AC
  Administered 2014-02-08: 1000 mL via INTRAVENOUS

## 2014-02-08 NOTE — Progress Notes (Signed)
[facility] PROGRESS NOTE  Filed Weights   02/06/14 0606 02/07/14 0442 02/08/14 0636  Weight: 92.4 kg (203 lb 11.3 oz) 89.812 kg (198 lb) 89.313 kg (196 lb 14.4 oz)        Intake/Output Summary (Last 24 hours) at 02/08/14 0815 Last data filed at 02/08/14 0654  Gross per 24 hour  Intake    840 ml  Output   1450 ml  Net   -610 ml     Assessment/Plan: Acute respiratory failure with hypoxia due HCAP: - Her lowest weight in the system was 89 kg, she is a very poor historian.  - with fever overnight, check CXR and CBC. Feels worst. Restart vanc and cefepime - Monitor strict I's and O's check electrolytes. Give a 1l of NS. - Daily weights. She is not tachycardic but in a Beta blocker. - low probability Vq scan. - needs sleep study as an outpatient.  Hyperkalemia: - d/c lasix NS bolus repeat b-met in afternoon.  Diabetes Mellitus:  - hgba1c pending and SSI. Holding glimepiride.   Chronic renal disease stage IV: - She is currently not on ACE-I GFR is less than 30. - Continue to monitor renal function.    Status: full code.  DVT Prophylaxis: heparin Family Communication: none at bedside  Disposition Plan:admit to tele    Consultants:  *None  Procedures: ECHO 10.2.2015: Wall thickness was increased in a pattern of mild LVH. Systolic function was normal. The estimated ejection fraction was in the range of 50% to 55%.  Left atrium: The atrium was mildly dilated.  Pulmonary arteries: PA peak pressure: 32 mm Hg.   Antibiotics:  1 dose of Vanco and cefepime.  HPI/Subjective: Feels wipe out, weak and thirsty  Objective: Filed Vitals:   02/07/14 1356 02/07/14 1731 02/07/14 2058 02/08/14 0636  BP: 126/58 141/61 136/62 144/61  Pulse: 59 73 68 75  Temp: 98.6 F (37 C)  98.4 F (36.9 C) 101.5 F (38.6 C)  TempSrc: Oral  Oral Oral  Resp: '20  20 20  ' Height:      Weight:    89.313 kg (196 lb 14.4 oz)  SpO2: 100%  100% 99%     Exam:  General: Alert,  awake, oriented x3, in no acute distress.  HEENT: No bruits, no goiter. No appreciated JVD due to body habitus. Heart: Regular rate and rhythm, no lower extremity edema Lungs: Good air movement, crackles in her lungs bilaterally. Abdomen: Soft, some mild tenderness with pressure on her flanks. nondistended, positive bowel sounds.   Data Reviewed: Basic Metabolic Panel:  Recent Labs Lab 02/05/14 1216 02/06/14 0525 02/07/14 0415 02/08/14 0426  NA 142 142 144 145  K 4.3 4.3 4.3 5.5*  CL 107 107 107 104  CO2 18* '23 21 23  ' GLUCOSE 101* 99 64* 93  BUN 38* 45* 52* 58*  CREATININE 1.82* 2.22* 2.41* 2.75*  CALCIUM 9.9 9.2 9.6 9.4   Liver Function Tests:  Recent Labs Lab 02/07/14 1042  AST 33  ALT 26  ALKPHOS 95  BILITOT 0.3  PROT 7.5  ALBUMIN 3.4*   No results found for this basename: LIPASE, AMYLASE,  in the last 168 hours No results found for this basename: AMMONIA,  in the last 168 hours CBC:  Recent Labs Lab 02/05/14 1216  WBC 7.5  HGB 9.6*  HCT 29.5*  MCV 91.3  PLT 181   Cardiac Enzymes: No results found for this basename: CKTOTAL, CKMB, CKMBINDEX, TROPONINI,  in the last 168  hours BNP (last 3 results)  Recent Labs  01/09/14 1515 02/05/14 1216  PROBNP 2595.0* 2767.0*   CBG:  Recent Labs Lab 02/07/14 1057 02/07/14 1656 02/07/14 1748 02/07/14 2103 02/08/14 0644  GLUCAP 145* 67* 117* 153* 92    No results found for this or any previous visit (from the past 240 hour(s)).   Studies: Nm Pulmonary Perf And Vent  02/07/2014   CLINICAL DATA:  Shortness of breath.  EXAM: NUCLEAR MEDICINE VENTILATION - PERFUSION LUNG SCAN  TECHNIQUE: Ventilation images were obtained in multiple projections using inhaled aerosol technetium 99 M DTPA. Perfusion images were obtained in multiple projections after intravenous injection of Tc-20mMAA.  RADIOPHARMACEUTICALS:  6.0 mCi Tc-93mTPA aerosol and 40.0 mCi Tc-9953mA  COMPARISON:  Chest radiograph of February 05, 2014.   FINDINGS: Ventilation: No focal ventilation defect.  Perfusion: No wedge shaped peripheral perfusion defects to suggest acute pulmonary embolism.  IMPRESSION: No definite perfusion defect is seen to suggest pulmonary embolus.   Electronically Signed   By: JamSabino DickD.   On: 02/07/2014 16:47    Scheduled Meds: . allopurinol  100 mg Oral BID  . antiseptic oral rinse  7 mL Mouth Rinse BID  . aspirin EC  81 mg Oral Daily  . carvedilol  12.5 mg Oral BID  . enoxaparin (LOVENOX) injection  30 mg Subcutaneous Q24H  . furosemide  80 mg Intravenous Q12H  . insulin aspart  0-9 Units Subcutaneous TID WC  . latanoprost  1 drop Both Eyes QHS  . simvastatin  20 mg Oral QHS   Continuous Infusions:    FELCharlynne Cousinsriad [facility] Pager [phone] 8PM-8AM, please contact night-coverage at [email or web address], password [email or web address]/31/2015, 8:15 AM  LOS: 3 days

## 2014-02-08 NOTE — Progress Notes (Signed)
ANTIBIOTIC CONSULT NOTE - FOLLOW UP  Pharmacy Consult:  Vancomycin and cefepime  Indication:  Possible HCAP  Allergies  Allergen Reactions  . Codeine Other (See Comments)    Unknown; pt can't remember. It was in the '70s    Patient Measurements: Height: 5\' 1"  (154.9 cm) (scale C) Weight: 196 lb 14.4 oz (89.313 kg) (scale c) IBW/kg (Calculated) : 47.8  Vital Signs: Temp: 101.5 F (38.6 C) (10/31 0636) Temp Source: Oral (10/31 0636) BP: 144/61 mmHg (10/31 0636) Pulse Rate: 75 (10/31 0636) Intake/Output from previous day: 10/30 0701 - 10/31 0700 In: 840 [P.O.:840] Out: 1450 [Urine:1450]  Labs:  Recent Labs  02/05/14 1216 02/05/14 1912 02/06/14 0525 02/07/14 0415 02/08/14 0426  WBC 7.5  --   --   --   --   HGB 9.6*  --   --   --   --   PLT 181  --   --   --   --   LABCREA  --  24.34  --   --   --   CREATININE 1.82*  --  2.22* 2.41* 2.75*   Estimated Creatinine Clearance: 17.7 ml/min (by C-G formula based on Cr of 2.75). No results found for this basename: VANCOTROUGH, [place]ak, VANCORANDOM, [place], G[place]PEAK, [place], [place], [place], TOBRARND, AMIKACINPEAK, AMIKACINTROU, AMIKACIN,  in the last 72 hours   Microbiology: Recent Results (from the past 720 hour(s))  CULTURE, BLOOD (ROUTINE X 2)     Status: None   Collection Time    01/09/14  6:55 PM      Result Value Ref Range Status   Specimen Description BLOOD RIGHT HAND   Final   Special Requests BOTTLES DRAWN AEROBIC AND ANAEROBIC 5CC   Final   Culture  Setup Time     Final   Value: 01/10/2014 00:51     Performed at [facility]   Culture     Final   Value: NO GROWTH 5 DAYS     Performed at [facility]   Report Status 01/15/2014 FINAL   Final  CULTURE, BLOOD (ROUTINE X 2)     Status: None   Collection Time    01/09/14  7:05 PM      Result Value Ref Range Status   Specimen Description BLOOD HAND LEFT   Final   Special Requests BOTTLES DRAWN AEROBIC ONLY [place]   Final   Culture   Setup Time     Final   Value: 01/10/2014 00:53     Performed at [facility]   Culture     Final   Value: NO GROWTH 5 DAYS     Performed at [facility]   Report Status 01/16/2014 FINAL   Final  URINE CULTURE     Status: None   Collection Time    01/09/14 10:50 PM      Result Value Ref Range Status   Specimen Description URINE, CLEAN CATCH   Final   Special Requests NONE   Final   Culture  Setup Time     Final   Value: 01/10/2014 08:55     Performed at [place]     Final   Value: NO GROWTH     Performed at [facility]   Culture     Final   Value: NO GROWTH     Performed at [facility]   Report Status 01/11/2014 FINAL   Final    Assessment: 78yo female  presents with SOB and DOE started 5 days PTA. Pharmacy was initially consulted on 10/29 to dose cefepime and vancomycin for HCAP. Pt only received 1 dose of each before abx were discontinued. Pharmacy reconsulted 10/31 to dose vanc and cefepime for HCAP d/t fever overnight with a Tmax 101.5. Checking CXR and CBC today.   She had been discharged from [facility] on cefdinir and azithromycin for CAP 01/13/14.  Her renal function is worsening with SCr 2.75 today, will adjust vancomycin to attain goal trough.   Vanc 10/29 >>10/29, 10/31>> Cefepime 10/29 >>10/29, 10/31>>  Goal of Therapy:  Vancomycin trough level 15-20 mcg/ml   Plan:  - Cefepime 1gm IV Q24H - Vanc 1000mg  IV Q48H - Monitor renal fxn, clinical progress, vanc trough as indicated  Megan E. Supple, Pharm.D Clinical Pharmacy Resident Pager: [phone] 02/08/2014 9:47 AM

## 2014-02-09 DIAGNOSIS — E875 Hyperkalemia: Secondary | ICD-10-CM

## 2014-02-09 LAB — GLUCOSE, CAPILLARY
Glucose-Capillary: 207 mg/dL — ABNORMAL HIGH (ref 70–99)
Glucose-Capillary: 110 mg/dL — ABNORMAL HIGH (ref 70–99)
Glucose-Capillary: 151 mg/dL — ABNORMAL HIGH (ref 70–99)

## 2014-02-09 LAB — CBC
HCT: 28 % — ABNORMAL LOW (ref 36.0–46.0)
Hemoglobin: 8.7 g/dL — ABNORMAL LOW (ref 12.0–15.0)
MCH: 28.6 pg (ref 26.0–34.0)
MCHC: 31.1 g/dL (ref 30.0–36.0)
MCV: 92.1 fL (ref 78.0–100.0)
Platelets: 182 10*3/uL (ref 150–400)
RBC: 3.04 MIL/uL — ABNORMAL LOW (ref 3.87–5.11)
RDW: 14.3 % (ref 11.5–15.5)
WBC: 4.5 10*3/uL (ref 4.0–10.5)

## 2014-02-09 LAB — BASIC METABOLIC PANEL
Anion gap: 12 (ref 5–15)
BUN: 65 mg/dL — ABNORMAL HIGH (ref 6–23)
CO2: 26 mEq/L (ref 19–32)
Calcium: 9 mg/dL (ref 8.4–10.5)
Chloride: 100 mEq/L (ref 96–112)
Creatinine, Ser: 2.69 mg/dL — ABNORMAL HIGH (ref 0.50–1.10)
GFR calc Af Amer: 19 mL/min — ABNORMAL LOW (ref >90)
GFR calc non Af Amer: 16 mL/min — ABNORMAL LOW (ref >90)
Glucose, Bld: 127 mg/dL — ABNORMAL HIGH (ref 70–99)
Potassium: 3.9 mEq/L (ref 3.7–5.3)
Sodium: 138 mEq/L (ref 137–147)

## 2014-02-09 MED ORDER — SODIUM CHLORIDE 0.9 % IV BOLUS (SEPSIS): 1000.0000 mL | Freq: Once | INTRAVENOUS | Status: DC

## 2014-02-09 MED ORDER — SODIUM CHLORIDE 0.9 % IV SOLN: INTRAVENOUS | Status: DC

## 2014-02-09 NOTE — Progress Notes (Addendum)
[facility] PROGRESS NOTE  Filed Weights   02/07/14 0442 02/08/14 0636 02/09/14 0550  Weight: 89.812 kg (198 lb) 89.313 kg (196 lb 14.4 oz) 90.538 kg (199 lb 9.6 oz)        Intake/Output Summary (Last 24 hours) at 02/09/14 1013 Last data filed at 02/09/14 [id number]  Gross per 24 hour  Intake    480 ml  Output   1000 ml  Net   -520 ml     Assessment/Plan: Acute respiratory failure with hypoxia due HCAP: - She is a very poor historian.  - with fever overnight, check CXR and CBC. Feels worst. Restart vanc and cefepime 10.31.2015 has remained afebrile. - she relates she feels better. - Monitor strict I's and O's check electrolytes. Given a 1l of NS and Cr stabilize. - Daily weights. She is not tachycardic but in a Beta blocker. - low probability Vq scan. - needs sleep study as an outpatient.  Hyperkalemia: - D/c lasix, NS bolus  - Resolved with IV fluids.  Diabetes Mellitus:  - hgba1c pending and SSI. Holding glimepiride.   Chronic renal disease stage IV: - She is currently not on ACE-I GFR is less than 30. - Continue to monitor renal function.    Status: full code.  DVT Prophylaxis: heparin Family Communication: none at bedside  Disposition Plan:admit to tele    Consultants:  *None  Procedures: ECHO 10.2.2015: Wall thickness was increased in a pattern of mild LVH. Systolic function was normal. The estimated ejection fraction was in the range of 50% to 55%.  Left atrium: The atrium was mildly dilated.  Pulmonary arteries: PA peak pressure: 32 mm Hg.   Antibiotics:  1 dose of Vanco and cefepime.  HPI/Subjective: Better today no fevers or cough  Objective: Filed Vitals:   02/08/14 1029 02/08/14 1357 02/08/14 2048 02/09/14 0550  BP: 133/56 114/47 129/54 130/57  Pulse: 75 64 69 70  Temp:  98.2 F (36.8 C) 98.9 F (37.2 C) 97.9 F (36.6 C)  TempSrc:  Oral Oral Oral  Resp:  20 20 20   Height:      Weight:    90.538 kg (199 lb 9.6 oz)  SpO2:  100%  96% 97%     Exam:  General: Alert, awake, oriented x3, in no acute distress.  HEENT: No bruits, no goiter. No appreciated JVD due to body habitus. Heart: Regular rate and rhythm, no lower extremity edema Lungs: Good air movement, crackles in her lungs bilaterally. Abdomen: Soft, some mild tenderness with pressure on her flanks. nondistended, positive bowel sounds.   Data Reviewed: Basic Metabolic Panel:  Recent Labs Lab 02/05/14 1216 02/06/14 0525 02/07/14 0415 02/08/14 0426 02/09/14 0348  NA 142 142 144 145 138  K 4.3 4.3 4.3 5.5* 3.9  CL 107 107 107 104 100  CO2 18* 23 21 23 26   GLUCOSE 101* 99 64* 93 127*  BUN 38* 45* 52* 58* 65*  CREATININE 1.82* 2.22* 2.41* 2.75* 2.69*  CALCIUM 9.9 9.2 9.6 9.4 9.0   Liver Function Tests:  Recent Labs Lab 02/07/14 1042  AST 33  ALT 26  ALKPHOS 95  BILITOT 0.3  PROT 7.5  ALBUMIN 3.4*   No results for input(s): LIPASE, AMYLASE in the last 168 hours. No results for input(s): AMMONIA in the last 168 hours. CBC:  Recent Labs Lab 02/05/14 1216 02/09/14 0348  WBC 7.5 4.5  HGB 9.6* 8.7*  HCT 29.5* 28.0*  MCV 91.3 92.1  PLT 181 182  Cardiac Enzymes: No results for input(s): CKTOTAL, CKMB, CKMBINDEX, TROPONINI in the last 168 hours. BNP (last 3 results)  Recent Labs  01/09/14 1515 02/05/14 1216  PROBNP 2595.0* 2767.0*   CBG:  Recent Labs Lab 02/07/14 2103 02/08/14 0644 02/08/14 1107 02/08/14 1611 02/08/14 2107  GLUCAP 153* 92 181* 160* 152*    No results found for this or any previous visit (from the past 240 hour(s)).   Studies: Dg Chest 2 View  02/08/2014   CLINICAL DATA:  Shortness of breath.  Hypertension.  EXAM: CHEST  2 VIEW  COMPARISON:  02/05/2014  FINDINGS: Cardiomegaly is stable. There has been near complete resolution of bilateral pulmonary airspace opacity since prior study.  IMPRESSION: Near complete resolution of bilateral airspace opacity compared to prior exam. Stable cardiomegaly.    Electronically Signed   By: Earle Gell M.D.   On: 02/08/2014 13:10   Nm Pulmonary Perf And Vent  02/07/2014   CLINICAL DATA:  Shortness of breath.  EXAM: NUCLEAR MEDICINE VENTILATION - PERFUSION LUNG SCAN  TECHNIQUE: Ventilation images were obtained in multiple projections using inhaled aerosol technetium 99 M DTPA. Perfusion images were obtained in multiple projections after intravenous injection of Tc-44m MAA.  RADIOPHARMACEUTICALS:  6.0 mCi Tc-2m DTPA aerosol and 40.0 mCi Tc-38m MAA  COMPARISON:  Chest radiograph of February 05, 2014.  FINDINGS: Ventilation: No focal ventilation defect.  Perfusion: No wedge shaped peripheral perfusion defects to suggest acute pulmonary embolism.  IMPRESSION: No definite perfusion defect is seen to suggest pulmonary embolus.   Electronically Signed   By: Sabino Dick M.D.   On: 02/07/2014 16:47    Scheduled Meds: . allopurinol  100 mg Oral BID  . antiseptic oral rinse  7 mL Mouth Rinse BID  . aspirin EC  81 mg Oral Daily  . carvedilol  12.5 mg Oral BID  . ceFEPime (MAXIPIME) IV  1 g Intravenous Q24H  . enoxaparin (LOVENOX) injection  30 mg Subcutaneous Q24H  . insulin aspart  0-9 Units Subcutaneous TID WC  . latanoprost  1 drop Both Eyes QHS  . simvastatin  20 mg Oral QHS  . sodium chloride  1,000 mL Intravenous Once  . vancomycin  1,000 mg Intravenous Q48H   Continuous Infusions: . sodium chloride       [name]  [facility] Pager [phone] If 8PM-8AM, please contact night-coverage at [email or web address], password [facility] 02/09/2014, 10:13 AM  LOS: 4 days

## 2014-02-10 DIAGNOSIS — J189 Pneumonia, unspecified organism: Secondary | ICD-10-CM | POA: Insufficient documentation

## 2014-02-10 LAB — GLUCOSE, CAPILLARY
Glucose-Capillary: 99 mg/dL (ref 70–99)
Glucose-Capillary: 106 mg/dL — ABNORMAL HIGH (ref 70–99)
Glucose-Capillary: 198 mg/dL — ABNORMAL HIGH (ref 70–99)
Glucose-Capillary: 140 mg/dL — ABNORMAL HIGH (ref 70–99)
Glucose-Capillary: 108 mg/dL — ABNORMAL HIGH (ref 70–99)

## 2014-02-10 LAB — BASIC METABOLIC PANEL
Anion gap: 11 (ref 5–15)
BUN: 56 mg/dL — ABNORMAL HIGH (ref 6–23)
CO2: 29 mEq/L (ref 19–32)
Calcium: 9.1 mg/dL (ref 8.4–10.5)
Chloride: 101 mEq/L (ref 96–112)
Creatinine, Ser: 2.4 mg/dL — ABNORMAL HIGH (ref 0.50–1.10)
GFR calc Af Amer: 21 mL/min — ABNORMAL LOW (ref >90)
GFR calc non Af Amer: 19 mL/min — ABNORMAL LOW (ref >90)
Glucose, Bld: 110 mg/dL — ABNORMAL HIGH (ref 70–99)
Potassium: 4.5 mEq/L (ref 3.7–5.3)
Sodium: 141 mEq/L (ref 137–147)

## 2014-02-10 LAB — CBC
HCT: 29 % — ABNORMAL LOW (ref 36.0–46.0)
Hemoglobin: 9 g/dL — ABNORMAL LOW (ref 12.0–15.0)
MCH: 28.7 pg (ref 26.0–34.0)
MCHC: 31 g/dL (ref 30.0–36.0)
MCV: 92.4 fL (ref 78.0–100.0)
Platelets: 208 10*3/uL (ref 150–400)
RBC: 3.14 MIL/uL — ABNORMAL LOW (ref 3.87–5.11)
RDW: 14.1 % (ref 11.5–15.5)
WBC: 4.5 10*3/uL (ref 4.0–10.5)

## 2014-02-10 MED ORDER — OXYCODONE HCL 5 MG PO TABS
5.0000 mg | ORAL_TABLET | ORAL | Status: DC | PRN
  Filled 2014-02-10: qty 1

## 2014-02-10 MED ORDER — LEVOFLOXACIN 750 MG PO TABS
750.0000 mg | ORAL_TABLET | Freq: Once | ORAL | Status: AC
  Administered 2014-02-10: 750 mg via ORAL
  Filled 2014-02-10: qty 1

## 2014-02-10 MED ORDER — LEVOFLOXACIN 500 MG PO TABS: 500.0000 mg | ORAL_TABLET | ORAL | Status: DC

## 2014-02-10 MED ORDER — LEVOFLOXACIN 500 MG PO TABS: 500.0000 mg | ORAL_TABLET | Freq: Every day | ORAL | Status: DC

## 2014-02-10 NOTE — Progress Notes (Signed)
Physical Therapy Treatment Patient Details Name: Doris Howell MRN: [id number] DOB: March 30, 1936 Today's Date: February 26, 2014    History of Present Illness pt presents with Respiratory Failure and Hypoxia, and HF.  pt with recent admit for the same.      PT Comments    Patient ambulated on 2 liters O2 with multiple rest breaks required. Patient with increased fatigue and 2 noted LOB with + dizziness, improved with rest and controlled breathing. Will continue to see and progress as tolerated.  Follow Up Recommendations  Home health PT;Supervision - Intermittent     Equipment Recommendations  None recommended by PT    Recommendations for Other Services       Precautions / Restrictions Precautions Precautions: Fall Restrictions Weight Bearing Restrictions: No    Mobility  Bed Mobility Overal bed mobility: Modified Independent                Transfers Overall transfer level: Needs assistance Equipment used: None Transfers: Sit to/from Stand Sit to Stand: Supervision         General transfer comment: VCs for hand placement in coming to upright  Ambulation/Gait Ambulation/Gait assistance: Min guard;Min assist Ambulation Distance (Feet): 80 Feet Assistive device: Rolling walker (2 wheeled) (2 episodes on LOB with assist for stability) Gait Pattern/deviations: Step-through pattern;Decreased stride length     General Gait Details: Patient unsteady with ambulation (question medication impact) mobilized using RW, and 2 liters O2.   Stairs            Wheelchair Mobility    Modified Rankin (Stroke Patients Only)       Balance     Sitting balance-Leahy Scale: Good       Standing balance-Leahy Scale: Fair                      Cognition Arousal/Alertness: Awake/alert Behavior During Therapy: WFL for tasks assessed/performed Overall Cognitive Status: Within Functional Limits for tasks assessed                      Exercises       General Comments        Pertinent Vitals/Pain Pain Assessment: No/denies pain    Home Living                      Prior Function            PT Goals (current goals can now be found in the care plan section) Acute Rehab PT Goals Patient Stated Goal: Breathe better PT Goal Formulation: With patient Time For Goal Achievement: 02/21/14 Potential to Achieve Goals: Good Progress towards PT goals: Progressing toward goals    Frequency  Min 3X/week    PT Plan Current plan remains appropriate    Co-evaluation             End of Session Equipment Utilized During Treatment: Oxygen Activity Tolerance: Patient limited by fatigue Patient left: in bed;with call bell/phone within reach;with family/visitor present     Time: 1205-1224 PT Time Calculation (min): 19 min  Charges:  $Gait Training: 8-22 mins                    G CodesDuncan Dull 2014-02-26, 1:26 PM Alben Deeds, [place] DPT  [phone]

## 2014-02-10 NOTE — Progress Notes (Signed)
Patient is sleeping peacefully. Maintained on the monitor and is in NSR. Will continue to monitor.  Esperanza Heir, RN

## 2014-02-10 NOTE — Progress Notes (Signed)
[facility] PROGRESS NOTE  Filed Weights   02/07/14 0442 02/08/14 0636 02/09/14 0550  Weight: 89.812 kg (198 lb) 89.313 kg (196 lb 14.4 oz) 90.538 kg (199 lb 9.6 oz)        Intake/Output Summary (Last 24 hours) at 02/10/14 1002 Last data filed at 02/10/14 [id number]  Gross per 24 hour  Intake    700 ml  Output    950 ml  Net   -250 ml     Assessment/Plan: Acute respiratory failure with hypoxia due HCAP: - She is a very poor historian.  - No fever overnight, check CXR and CBC. Feels worst. Restart vanc and cefepime 10.31.2015 has remained afebrile, de-escalate to levaquin - she relates she feels better. - Daily weights. She is not tachycardic but in a Beta blocker. - low probability Vq scan. - needs sleep study as an outpatient.  Hyperkalemia: - D/c lasix, NS bolus  - Resolved with IV fluids.  Diabetes Mellitus:  - hgba1c pending and SSI. Holding glimepiride.   Chronic renal disease stage IV: - She is currently not on ACE-I GFR is less than 30. - renal function improved worsen due to diuresis.   Status: full code.  DVT Prophylaxis: heparin Family Communication: none at bedside  Disposition Plan:admit to tele    Consultants:  *None  Procedures: ECHO 10.2.2015: Wall thickness was increased in a pattern of mild LVH. Systolic function was normal. The estimated ejection fraction was in the range of 50% to 55%.  Left atrium: The atrium was mildly dilated.  Pulmonary arteries: PA peak pressure: 32 mm Hg.   Antibiotics:  1 dose of Vanco and cefepime.  HPI/Subjective: No complains.  Objective: Filed Vitals:   02/09/14 1023 02/09/14 1416 02/09/14 1937 02/10/14 0557  BP: 139/53 125/53 140/71 130/46  Pulse: 67 62 67 65  Temp:  99.3 F (37.4 C) 99.3 F (37.4 C) 99 F (37.2 C)  TempSrc:  Oral Oral Oral  Resp:  20 20 18   Height:      Weight:      SpO2:  97% 100% 100%     Exam:  General: Alert, awake, oriented x3, in no acute distress.  HEENT: No  bruits, no goiter. No appreciated JVD due to body habitus. Heart: Regular rate and rhythm, no lower extremity edema Lungs: Good air movement, clear Abdomen: Soft, some mild tenderness with pressure on her flanks. nondistended, positive bowel sounds.   Data Reviewed: Basic Metabolic Panel:  Recent Labs Lab 02/06/14 0525 02/07/14 0415 02/08/14 0426 02/09/14 0348 02/10/14 0415  NA 142 144 145 138 141  K 4.3 4.3 5.5* 3.9 4.5  CL 107 107 104 100 101  CO2 23 21 23 26 29   GLUCOSE 99 64* 93 127* 110*  BUN 45* 52* 58* 65* 56*  CREATININE 2.22* 2.41* 2.75* 2.69* 2.40*  CALCIUM 9.2 9.6 9.4 9.0 9.1   Liver Function Tests:  Recent Labs Lab 02/07/14 1042  AST 33  ALT 26  ALKPHOS 95  BILITOT 0.3  PROT 7.5  ALBUMIN 3.4*   No results for input(s): LIPASE, AMYLASE in the last 168 hours. No results for input(s): AMMONIA in the last 168 hours. CBC:  Recent Labs Lab 02/05/14 1216 02/09/14 0348 02/10/14 0415  WBC 7.5 4.5 4.5  HGB 9.6* 8.7* 9.0*  HCT 29.5* 28.0* 29.0*  MCV 91.3 92.1 92.4  PLT 181 182 208   Cardiac Enzymes: No results for input(s): CKTOTAL, CKMB, CKMBINDEX, TROPONINI in the last 168 hours. BNP (last  3 results)  Recent Labs  01/09/14 1515 02/05/14 1216  PROBNP 2595.0* 2767.0*   CBG:  Recent Labs Lab 02/09/14 0723 02/09/14 1107 02/09/14 1612 02/09/14 2148 02/10/14 0640  GLUCAP 99 207* 110* 151* 106*    No results found for this or any previous visit (from the past 240 hour(s)).   Studies: Dg Chest 2 View  02/08/2014   CLINICAL DATA:  Shortness of breath.  Hypertension.  EXAM: CHEST  2 VIEW  COMPARISON:  02/05/2014  FINDINGS: Cardiomegaly is stable. There has been near complete resolution of bilateral pulmonary airspace opacity since prior study.  IMPRESSION: Near complete resolution of bilateral airspace opacity compared to prior exam. Stable cardiomegaly.   Electronically Signed   By: Earle Gell M.D.   On: 02/08/2014 13:10    Scheduled  Meds: . allopurinol  100 mg Oral BID  . antiseptic oral rinse  7 mL Mouth Rinse BID  . aspirin EC  81 mg Oral Daily  . carvedilol  12.5 mg Oral BID  . ceFEPime (MAXIPIME) IV  1 g Intravenous Q24H  . enoxaparin (LOVENOX) injection  30 mg Subcutaneous Q24H  . insulin aspart  0-9 Units Subcutaneous TID WC  . latanoprost  1 drop Both Eyes QHS  . simvastatin  20 mg Oral QHS  . vancomycin  1,000 mg Intravenous Q48H   Continuous Infusions:     [name]  [facility] Pager [phone] If 8PM-8AM, please contact night-coverage at [email or web address], password [facility] 02/10/2014, 10:02 AM  LOS: 5 days

## 2014-02-10 NOTE — Progress Notes (Signed)
Pt complaining of aching pain to middle of lower abdomen not relieved by tylenol. MD notified and new order for Oxy IR PRN. When pt re-assessed pt sleeping in bed. Will continue to monitor. Ronnette Hila, RN

## 2014-02-11 LAB — CBC
HCT: 29.1 % — ABNORMAL LOW (ref 36.0–46.0)
Hemoglobin: 9.2 g/dL — ABNORMAL LOW (ref 12.0–15.0)
MCH: 29 pg (ref 26.0–34.0)
MCHC: 31.6 g/dL (ref 30.0–36.0)
MCV: 91.8 fL (ref 78.0–100.0)
Platelets: 196 10*3/uL (ref 150–400)
RBC: 3.17 MIL/uL — ABNORMAL LOW (ref 3.87–5.11)
RDW: 13.9 % (ref 11.5–15.5)
WBC: 3.9 10*3/uL — ABNORMAL LOW (ref 4.0–10.5)

## 2014-02-11 LAB — BASIC METABOLIC PANEL
Anion gap: 12 (ref 5–15)
BUN: 56 mg/dL — ABNORMAL HIGH (ref 6–23)
CO2: 27 mEq/L (ref 19–32)
Calcium: 9.3 mg/dL (ref 8.4–10.5)
Chloride: 101 mEq/L (ref 96–112)
Creatinine, Ser: 2.15 mg/dL — ABNORMAL HIGH (ref 0.50–1.10)
GFR calc Af Amer: 24 mL/min — ABNORMAL LOW (ref >90)
GFR calc non Af Amer: 21 mL/min — ABNORMAL LOW (ref >90)
Glucose, Bld: 102 mg/dL — ABNORMAL HIGH (ref 70–99)
Potassium: 4.3 mEq/L (ref 3.7–5.3)
Sodium: 140 mEq/L (ref 137–147)

## 2014-02-11 LAB — GLUCOSE, CAPILLARY
Glucose-Capillary: 100 mg/dL — ABNORMAL HIGH (ref 70–99)
Glucose-Capillary: 110 mg/dL — ABNORMAL HIGH (ref 70–99)

## 2014-02-11 MED ORDER — SORBITOL 70 % SOLN
30.0000 mL | Freq: Once | Status: DC
  Filled 2014-02-11: qty 30

## 2014-02-11 MED ORDER — FUROSEMIDE 80 MG PO TABS: 40.0000 mg | ORAL_TABLET | Freq: Every day | ORAL | Status: DC | PRN

## 2014-02-11 MED ORDER — LEVOFLOXACIN 750 MG PO TABS: 750.0000 mg | ORAL_TABLET | Freq: Every day | ORAL | Status: DC

## 2014-02-11 MED ORDER — POLYETHYLENE GLYCOL 3350 17 G PO PACK
17.0000 g | PACK | Freq: Every day | ORAL | Status: DC
  Filled 2014-02-11: qty 1

## 2014-02-11 NOTE — Progress Notes (Signed)
DC IV, DC Tele, DC Home. Discharge instructions and home medications discussed with patient and patient's daughter. Patient and daughter denied any questions or concerns at this time. Patient leaving unit via wheelchair and appears in no acute distress. 

## 2014-02-11 NOTE — Progress Notes (Signed)
Pt complaining of pink/red streaks when blowing nose. No current bleeding noted. Pt wearing oxygen. Humidification added to pt oxygen, pt educated to notify RN if bleeding noted.

## 2014-02-11 NOTE — Discharge Summary (Signed)
Physician Discharge Summary  Doris Howell [id number] DOB: 02-14-1936 DOA: 02/05/2014  PCP: Irven Shelling, MD  Admit date: 02/05/2014 Discharge date: 02/11/2014  Time spent: 35 minutes  Recommendations for Outpatient Follow-up:  1. Follow up with PCP in 2 weeks.  Discharge Diagnoses:  Principal Problem:   Acute respiratory failure with hypoxia Active Problems:   Chronic diastolic heart failure   CKD (chronic kidney disease), stage III   Acute diastolic heart failure   Hyperkalemia   HCAP (healthcare-associated pneumonia)   Discharge Condition: stable  Diet recommendation: heart healthy  Filed Weights   02/08/14 0636 02/09/14 0550 02/11/14 0354  Weight: 89.313 kg (196 lb 14.4 oz) 90.538 kg (199 lb 9.6 oz) 90.765 kg (200 lb 1.6 oz)    History of present illness:  78 y.o. female recently discharged from the hospital after being treated for pneumonia comes back for worsening sob on exertion, associated with orthopnea and pedal edema. She was seen in the doctors office and sent to ED for evaluation. Her CXR revealed pulmonary edema vs multi focal pneumonia. She is then referred to medical service for admission. She denies any other complaints. She denies having diabetes and is refusing cbg's. She reported that she was on lasix 40 mg daily but the dose was cut to 20 mg daily and later on discontinued for renal insufficiency. She will be admitte dto telemetry and started on IV lasix.    Hospital Course:  Acute respiratory failure with hypoxia due HCAP: - She is a very poor historian. Was initially treated with IV lasix with some improvement in SOB, then develop fevers - CXR showed improvement in interstitial edema. Started on vanc and cefepime 10.31.2015 has remained afebrile, de-escalate to levaquin - Daily weights. She is not tachycardic but in a Beta blocker. - low probability Vq scan. - needs sleep study as an outpatient.  Hyperkalemia: - D/c lasix, NS bolus  and resolved with IV fluids.  Diabetes Mellitus:  - SSI while in house. Held glimepiride.  - resume as an outpatient.  Chronic renal disease stage IV: - She is currently not on ACE-I GFR is less than 30. - if Bp can tolerate can start BiDil as an outpatient.  Procedures:  CXR  Consultations:  noen  Discharge Exam: Filed Vitals:   02/11/14 0527  BP: 110/49  Pulse: 59  Temp: 98.4 F (36.9 C)  Resp: 16    General: A&O x3 Cardiovascular: RRR Respiratory: good air movement CTA B/L  Discharge Instructions You were cared for by a hospitalist during your hospital stay. If you have any questions about your discharge medications or the care you received while you were in the hospital after you are discharged, you can call the unit and asked to speak with the hospitalist on call if the hospitalist that took care of you is not available. Once you are discharged, your primary care physician will handle any further medical issues. Please note that NO REFILLS for any discharge medications will be authorized once you are discharged, as it is imperative that you return to your primary care physician (or establish a relationship with a primary care physician if you do not have one) for your aftercare needs so that they can reassess your need for medications and monitor your lab values.  Discharge Instructions    Diet - low sodium heart healthy    Complete by:  As directed      Increase activity slowly    Complete by:  As directed  Current Discharge Medication List    START taking these medications   Details  levofloxacin (LEVAQUIN) 750 MG tablet Take 1 tablet (750 mg total) by mouth daily. Qty: 7 tablet, Refills: 0      CONTINUE these medications which have CHANGED   Details  furosemide (LASIX) 80 MG tablet Take 0.5 tablets (40 mg total) by mouth daily as needed (swelling).      CONTINUE these medications which have NOT CHANGED   Details  albuterol (PROVENTIL  HFA;VENTOLIN HFA) 108 (90 BASE) MCG/ACT inhaler Inhale 2 puffs into the lungs every 6 (six) hours as needed for wheezing or shortness of breath. Qty: 1 Inhaler, Refills: 0    allopurinol (ZYLOPRIM) 100 MG tablet Take 100 mg by mouth 2 (two) times daily.    amLODipine (NORVASC) 10 MG tablet Take 10 mg by mouth every morning.     aspirin EC 81 MG tablet Take 81 mg by mouth daily.    bimatoprost (LUMIGAN) 0.01 % SOLN Place 1 drop into both eyes at bedtime.     carvedilol (COREG) 12.5 MG tablet Take 12.5 mg by mouth 2 (two) times daily with a meal.    cholecalciferol (VITAMIN D) 1000 UNITS tablet Take 1,000 Units by mouth daily.    glimepiride (AMARYL) 1 MG tablet Take 1 mg by mouth daily before breakfast.    simvastatin (ZOCOR) 20 MG tablet Take 20 mg by mouth every evening.       Allergies  Allergen Reactions  . Codeine Other (See Comments)    Unknown; pt can't remember. It was in the '70s   Follow-up Information    Follow up with Irven Shelling, MD In 2 weeks.   Specialty:  Internal Medicine   Why:  hospital follow up   Contact information:   [address], [address] [place] [address] [phone]        The results of significant diagnostics from this hospitalization (including imaging, microbiology, ancillary and laboratory) are listed below for reference.    Significant Diagnostic Studies: Dg Chest 2 View  02/08/2014   CLINICAL DATA:  Shortness of breath.  Hypertension.  EXAM: CHEST  2 VIEW  COMPARISON:  02/05/2014  FINDINGS: Cardiomegaly is stable. There has been near complete resolution of bilateral pulmonary airspace opacity since prior study.  IMPRESSION: Near complete resolution of bilateral airspace opacity compared to prior exam. Stable cardiomegaly.   Electronically Signed   By: Earle Gell M.D.   On: 02/08/2014 13:10   Dg Chest 2 View  02/05/2014   ADDENDUM REPORT: 02/05/2014 13:20  ADDENDUM: Comparison is made with chest x-ray dated  01/09/2014.   Electronically Signed   By: Kathreen Devoid   On: 02/05/2014 13:20   02/05/2014   CLINICAL DATA:  Shortness of breath for 2 days  EXAM: CHEST  2 VIEW  COMPARISON:  None.  FINDINGS: There is patchy interstitial and alveolar airspace opacities involving the upper and lower lungs, right greater than left. There is a small right pleural effusion. There is no significant left pleural effusion. There is no pneumothorax. There is stable cardiomegaly.  The osseous structures are unremarkable.  IMPRESSION: 1. Bilateral interstitial and alveolar airspace opacities with a small right pleural effusion and cardiomegaly. The overall appearance is concerning for pulmonary edema versus multilobar pneumonia.  Electronically Signed: By: Kathreen Devoid On: 02/05/2014 13:17   US Renal  02/05/2014   CLINICAL DATA:  Elevated creatinine.  EXAM: RENAL/URINARY TRACT ULTRASOUND COMPLETE  COMPARISON:  01/11/2014  FINDINGS: Right Kidney:  Length: 12.3 cm. Echogenicity appears increased. No mass or hydronephrosis visualized.  Left Kidney:  Length: Status post nephrectomy.  Bladder:  Appears normal for degree of bladder distention.  IMPRESSION: 1. Echogenic right kidney compatible with chronic medical renal disease. 2. No evidence for hydronephrosis.   Electronically Signed   By: Kerby Moors M.D.   On: 02/05/2014 18:54   Nm Pulmonary Perf And Vent  02/07/2014   CLINICAL DATA:  Shortness of breath.  EXAM: NUCLEAR MEDICINE VENTILATION - PERFUSION LUNG SCAN  TECHNIQUE: Ventilation images were obtained in multiple projections using inhaled aerosol technetium 99 M DTPA. Perfusion images were obtained in multiple projections after intravenous injection of Tc-35m MAA.  RADIOPHARMACEUTICALS:  6.0 mCi Tc-67m DTPA aerosol and 40.0 mCi Tc-53m MAA  COMPARISON:  Chest radiograph of February 05, 2014.  FINDINGS: Ventilation: No focal ventilation defect.  Perfusion: No wedge shaped peripheral perfusion defects to suggest acute pulmonary  embolism.  IMPRESSION: No definite perfusion defect is seen to suggest pulmonary embolus.   Electronically Signed   By: Sabino Dick M.D.   On: 02/07/2014 16:47    Microbiology: No results found for this or any previous visit (from the past 240 hour(s)).   Labs: Basic Metabolic Panel:  Recent Labs Lab 02/07/14 0415 02/08/14 0426 02/09/14 0348 02/10/14 0415 02/11/14 0557  NA 144 145 138 141 140  K 4.3 5.5* 3.9 4.5 4.3  CL 107 104 100 101 101  CO2 21 23 26 29 27   GLUCOSE 64* 93 127* 110* 102*  BUN 52* 58* 65* 56* 56*  CREATININE 2.41* 2.75* 2.69* 2.40* 2.15*  CALCIUM 9.6 9.4 9.0 9.1 9.3   Liver Function Tests:  Recent Labs Lab 02/07/14 1042  AST 33  ALT 26  ALKPHOS 95  BILITOT 0.3  PROT 7.5  ALBUMIN 3.4*   No results for input(s): LIPASE, AMYLASE in the last 168 hours. No results for input(s): AMMONIA in the last 168 hours. CBC:  Recent Labs Lab 02/05/14 1216 02/09/14 0348 02/10/14 0415 02/11/14 0557  WBC 7.5 4.5 4.5 3.9*  HGB 9.6* 8.7* 9.0* 9.2*  HCT 29.5* 28.0* 29.0* 29.1*  MCV 91.3 92.1 92.4 91.8  PLT 181 182 208 196   Cardiac Enzymes: No results for input(s): CKTOTAL, CKMB, CKMBINDEX, TROPONINI in the last 168 hours. BNP: BNP (last 3 results)  Recent Labs  01/09/14 1515 02/05/14 1216  PROBNP 2595.0* 2767.0*   CBG:  Recent Labs Lab 02/10/14 0640 02/10/14 1126 02/10/14 1629 02/10/14 2204 02/11/14 0604  GLUCAP 106* 198* 140* 108* 100*       Signed:  [name]  [facility] 02/11/2014, 9:33 AM

## 2014-03-13 ENCOUNTER — Other Ambulatory Visit: Payer: Self-pay | Admitting: Gastroenterology

## 2014-03-13 DIAGNOSIS — K862 Cyst of pancreas: Secondary | ICD-10-CM

## 2014-03-26 ENCOUNTER — Ambulatory Visit
Admission: RE | Admit: 2014-03-26 | Discharge: 2014-03-26 | Disposition: A | Discharge status: Home / Self Care | Payer: Medicare Other | Source: Ambulatory Visit | Attending: Gastroenterology | Admitting: Gastroenterology

## 2014-03-26 DIAGNOSIS — K862 Cyst of pancreas: Secondary | ICD-10-CM

## 2014-04-05 ENCOUNTER — Encounter (HOSPITAL_COMMUNITY): Payer: Self-pay | Admitting: *Deleted

## 2014-04-05 ENCOUNTER — Emergency Department (HOSPITAL_COMMUNITY)
Admission: EM | Admit: 2014-04-05 | Discharge: 2014-04-06 | Disposition: A | Discharge status: Home / Self Care | Payer: Medicare Other | Attending: Emergency Medicine | Admitting: Emergency Medicine

## 2014-04-05 ENCOUNTER — Emergency Department (HOSPITAL_COMMUNITY): Payer: Medicare Other

## 2014-04-05 DIAGNOSIS — R109 Unspecified abdominal pain: Secondary | ICD-10-CM | POA: Diagnosis not present

## 2014-04-05 DIAGNOSIS — Z90710 Acquired absence of both cervix and uterus: Secondary | ICD-10-CM | POA: Diagnosis not present

## 2014-04-05 DIAGNOSIS — I10 Essential (primary) hypertension: Secondary | ICD-10-CM | POA: Insufficient documentation

## 2014-04-05 DIAGNOSIS — Z79899 Other long term (current) drug therapy: Secondary | ICD-10-CM | POA: Insufficient documentation

## 2014-04-05 DIAGNOSIS — R10A1 Flank pain, right side: Secondary | ICD-10-CM

## 2014-04-05 DIAGNOSIS — E119 Type 2 diabetes mellitus without complications: Secondary | ICD-10-CM | POA: Insufficient documentation

## 2014-04-05 DIAGNOSIS — Z87442 Personal history of urinary calculi: Secondary | ICD-10-CM | POA: Diagnosis not present

## 2014-04-05 DIAGNOSIS — R609 Edema, unspecified: Secondary | ICD-10-CM | POA: Diagnosis not present

## 2014-04-05 DIAGNOSIS — Z792 Long term (current) use of antibiotics: Secondary | ICD-10-CM | POA: Insufficient documentation

## 2014-04-05 DIAGNOSIS — Z8719 Personal history of other diseases of the digestive system: Secondary | ICD-10-CM | POA: Insufficient documentation

## 2014-04-05 DIAGNOSIS — Z8701 Personal history of pneumonia (recurrent): Secondary | ICD-10-CM | POA: Diagnosis not present

## 2014-04-05 DIAGNOSIS — E780 Pure hypercholesterolemia: Secondary | ICD-10-CM | POA: Diagnosis not present

## 2014-04-05 DIAGNOSIS — M109 Gout, unspecified: Secondary | ICD-10-CM | POA: Diagnosis not present

## 2014-04-05 DIAGNOSIS — Z7982 Long term (current) use of aspirin: Secondary | ICD-10-CM | POA: Diagnosis not present

## 2014-04-05 LAB — I-STAT TROPONIN, ED: Troponin i, poc: 0.02 ng/mL (ref 0.00–0.08)

## 2014-04-05 LAB — COMPREHENSIVE METABOLIC PANEL
ALT: 20 U/L (ref 0–35)
AST: 34 U/L (ref 0–37)
Albumin: 3.9 g/dL (ref 3.5–5.2)
Alkaline Phosphatase: 99 U/L (ref 39–117)
Anion gap: 9 (ref 5–15)
BUN: 48 mg/dL — ABNORMAL HIGH (ref 6–23)
CO2: 27 mmol/L (ref 19–32)
Calcium: 9.6 mg/dL (ref 8.4–10.5)
Chloride: 106 mEq/L (ref 96–112)
Creatinine, Ser: 2.24 mg/dL — ABNORMAL HIGH (ref 0.50–1.10)
GFR calc Af Amer: 23 mL/min — ABNORMAL LOW (ref >90)
GFR calc non Af Amer: 20 mL/min — ABNORMAL LOW (ref >90)
Glucose, Bld: 132 mg/dL — ABNORMAL HIGH (ref 70–99)
Potassium: 3.5 mmol/L (ref 3.5–5.1)
Sodium: 142 mmol/L (ref 135–145)
Total Bilirubin: 0.1 mg/dL — ABNORMAL LOW (ref 0.3–1.2)
Total Protein: 7.8 g/dL (ref 6.0–8.3)

## 2014-04-05 LAB — URINALYSIS, ROUTINE W REFLEX MICROSCOPIC
Bilirubin Urine: NEGATIVE
Glucose, UA: NEGATIVE mg/dL
Hgb urine dipstick: NEGATIVE
Ketones, ur: NEGATIVE mg/dL
Leukocytes, UA: NEGATIVE
Nitrite: NEGATIVE
Protein, ur: 30 mg/dL — AB
Specific Gravity, Urine: 1.01 (ref 1.005–1.030)
Urobilinogen, UA: 0.2 mg/dL (ref 0.0–1.0)
pH: 5.5 (ref 5.0–8.0)

## 2014-04-05 LAB — CBC
HCT: 28.7 % — ABNORMAL LOW (ref 36.0–46.0)
Hemoglobin: 9 g/dL — ABNORMAL LOW (ref 12.0–15.0)
MCH: 28.8 pg (ref 26.0–34.0)
MCHC: 31.4 g/dL (ref 30.0–36.0)
MCV: 91.7 fL (ref 78.0–100.0)
Platelets: 249 10*3/uL (ref 150–400)
RBC: 3.13 MIL/uL — ABNORMAL LOW (ref 3.87–5.11)
RDW: 15.1 % (ref 11.5–15.5)
WBC: 5.4 10*3/uL (ref 4.0–10.5)

## 2014-04-05 LAB — URINE MICROSCOPIC-ADD ON

## 2014-04-05 MED ORDER — KETOROLAC TROMETHAMINE 15 MG/ML IJ SOLN
15.0000 mg | Freq: Once | INTRAMUSCULAR | Status: AC
  Administered 2014-04-05: 15 mg via INTRAVENOUS
  Filled 2014-04-05: qty 1

## 2014-04-05 MED ORDER — ONDANSETRON HCL 4 MG/2ML IJ SOLN
4.0000 mg | Freq: Once | INTRAMUSCULAR | Status: AC
  Administered 2014-04-06: 4 mg via INTRAVENOUS
  Filled 2014-04-05: qty 2

## 2014-04-05 MED ORDER — HYDROMORPHONE HCL 1 MG/ML IJ SOLN
0.5000 mg | Freq: Once | INTRAMUSCULAR | Status: AC
  Administered 2014-04-05: 0.5 mg via INTRAVENOUS
  Filled 2014-04-05: qty 1

## 2014-04-05 MED ORDER — TRAMADOL HCL 50 MG PO TABS: 50.0000 mg | ORAL_TABLET | Freq: Four times a day (QID) | ORAL | Status: DC | PRN

## 2014-04-05 MED ORDER — ONDANSETRON HCL 4 MG/2ML IJ SOLN
4.0000 mg | Freq: Once | INTRAMUSCULAR | Status: AC
  Administered 2014-04-05: 4 mg via INTRAVENOUS
  Filled 2014-04-05: qty 2

## 2014-04-05 NOTE — ED Notes (Signed)
Patient states she began to have right flank pain on yesterday. She states that the pain is so severe that she is unable to move when it comes. She denies urinary symptoms, but the pain is unbearable. She has had kidney stones in the past. She took tylenol for the pain with slight relief. She denies fever, nausea, vomiting or diarrhea.

## 2014-04-05 NOTE — ED Notes (Signed)
Patient transported to CT 

## 2014-04-05 NOTE — Discharge Instructions (Signed)
Flank Pain °Flank pain refers to pain that is located on the side of the body between the upper abdomen and the back. The pain may occur over a short period of time (acute) or may be long-term or reoccurring (chronic). It may be mild or severe. Flank pain can be caused by many things. °CAUSES  °Some of the more common causes of flank pain include: °· Muscle strains.   °· Muscle spasms.   °· A disease of your spine (vertebral disk disease).   °· A lung infection (pneumonia).   °· Fluid around your lungs (pulmonary edema).   °· A kidney infection.   °· Kidney stones.   °· A very painful skin rash caused by the chickenpox virus (shingles).   °· Gallbladder disease.   °HOME CARE INSTRUCTIONS  °Home care will depend on the cause of your pain. In general, °· Rest as directed by your caregiver. °· Drink enough fluids to keep your urine clear or pale yellow. °· Only take over-the-counter or prescription medicines as directed by your caregiver. Some medicines may help relieve the pain. °· Tell your caregiver about any changes in your pain. °· Follow up with your caregiver as directed. °SEEK IMMEDIATE MEDICAL CARE IF:  °· Your pain is not controlled with medicine.   °· You have new or worsening symptoms. °· Your pain increases.   °· You have abdominal pain.   °· You have shortness of breath.   °· You have persistent nausea or vomiting.   °· You have swelling in your abdomen.   °· You feel faint or pass out.   °· You have blood in your urine. °· You have a fever or persistent symptoms for more than 2-3 days. °· You have a fever and your symptoms suddenly get worse. °MAKE SURE YOU:  °· Understand these instructions. °· Will watch your condition. °· Will get help right away if you are not doing well or get worse. °Document Released: 05/19/2005 Document Revised: 12/21/2011 Document Reviewed: 11/10/2011 °ExitCare® Patient Information ©2015 [employer]. This information is not intended to replace advice given to you by your  health care provider. Make sure you discuss any questions you have with your health care provider. ° °

## 2014-04-05 NOTE — ED Provider Notes (Signed)
CSN: [id number]     Arrival date & time 04/05/14  1601 History   First MD Initiated Contact with Patient 04/05/14 1849     Chief Complaint  Patient presents with  . Flank Pain     (Consider location/radiation/quality/duration/timing/severity/associated sxs/prior Treatment) HPI   Doris Howell with R flank pain. Onset yesterday when traightening up after bending over. Pain sharp. Worse with movement and palpation to the area. Better at rest. No appreciable exacerbating or relieving factors otherwise. No n/v. No urinary complaints. No fever or chills. Hx of stones, but not sure if similar. No fever or chills. Tried tylenol with minimal relief.   Past Medical History  Diagnosis Date  . Hypertension   . High cholesterol   . Kidney stones   . Type II diabetes mellitus   . GERD (gastroesophageal reflux disease)   . History of gout   . CAP (community acquired pneumonia) 01/09/2014    "first time I've ever had it"   Past Surgical History  Procedure Laterality Date  . Kidney stone surgery  10-05-12    "cut me on the side"  . Colonoscopy with propofol N/A 10/23/2012    Procedure: COLONOSCOPY WITH PROPOFOL;  Surgeon: [name], MD;  Location: [facility];  Service: Endoscopy;  Laterality: N/A;  . Abdominal hysterectomy  ~ 1974  . Lithotripsy  1980's?  . Cataract extraction w/ intraocular lens  implant, bilateral Bilateral 2014   History reviewed. No pertinent family history. History  Substance Use Topics  . Smoking status: Never Smoker   . Smokeless tobacco: Never Used  . Alcohol Use: No   OB History    No data available     Review of Systems  All systems reviewed and negative, other than as noted in HPI.   Allergies  Codeine  Home Medications   Prior to Admission medications   Medication Sig Start Date End Date Taking? Authorizing Provider  albuterol (PROVENTIL HFA;VENTOLIN HFA) 108 (90 BASE) MCG/ACT inhaler Inhale 2 puffs into the lungs every 6 (six) hours as needed for  wheezing or shortness of breath. 01/13/14  Yes [name], MD  allopurinol (ZYLOPRIM) 100 MG tablet Take 100 mg by mouth 2 (two) times daily.   Yes Historical Provider, MD  amLODipine (NORVASC) 10 MG tablet Take 10 mg by mouth every morning.    Yes Historical Provider, MD  aspirin EC 81 MG tablet Take 81 mg by mouth daily.   Yes Historical Provider, MD  bimatoprost (LUMIGAN) 0.01 % SOLN Place 1 drop into both eyes at bedtime.    Yes Historical Provider, MD  carvedilol (COREG) 12.5 MG tablet Take 12.5 mg by mouth 2 (two) times daily with a meal.   Yes Historical Provider, MD  cholecalciferol (VITAMIN D) 1000 UNITS tablet Take 1,000 Units by mouth daily.   Yes Historical Provider, MD  furosemide (LASIX) 80 MG tablet Take 0.5 tablets (40 mg total) by mouth daily as needed (swelling). 02/13/14  Yes [name], MD  glimepiride (AMARYL) 1 MG tablet Take 1 mg by mouth daily before breakfast.   Yes Historical Provider, MD  simvastatin (ZOCOR) 20 MG tablet Take 20 mg by mouth every evening.   Yes Historical Provider, MD  levofloxacin (LEVAQUIN) 750 MG tablet Take 1 tablet (750 mg total) by mouth daily. Patient not taking: Reported on 04/05/2014 02/11/14   [name], MD  traMADol (ULTRAM) 50 MG tablet Take 1 tablet (50 mg total) by mouth every 6 (six) hours as needed. 04/05/14  [name], MD   BP 146/66 mmHg  Pulse 68  Temp(Src) 97.8 F (36.6 C) (Oral)  Resp 18  SpO2 97% Physical Exam  Constitutional: She appears well-developed and well-nourished. No distress.  HENT:  Head: Normocephalic and atraumatic.  Eyes: Conjunctivae are normal. Right eye exhibits no discharge. Left eye exhibits no discharge.  Neck: Neck supple.  Cardiovascular: Normal rate, regular rhythm and normal heart sounds.  Exam reveals no gallop and no friction rub.   No murmur heard. Pulmonary/Chest: Effort normal and breath sounds normal. No respiratory distress.  Abdominal: Soft. She exhibits no distension.  There is no tenderness.  Musculoskeletal: She exhibits edema. She exhibits no tenderness.  Mild symmetric pitting LE edema  Neurological: She is alert.  Skin: Skin is warm and dry.  Psychiatric: She has a normal mood and affect. Her behavior is normal. Thought content normal.  Nursing note and vitals reviewed.   ED Course  Procedures (including critical care time) Labs Review Labs Reviewed  URINALYSIS, ROUTINE W REFLEX MICROSCOPIC - Abnormal; Notable for the following:    Protein, ur 30 (*)    All other components within normal limits  COMPREHENSIVE METABOLIC PANEL - Abnormal; Notable for the following:    Glucose, Bld 132 (*)    BUN 48 (*)    Creatinine, Ser 2.24 (*)    Total Bilirubin 0.1 (*)    GFR calc non Af Amer 20 (*)    GFR calc Af Amer 23 (*)    All other components within normal limits  CBC - Abnormal; Notable for the following:    RBC 3.13 (*)    Hemoglobin 9.0 (*)    HCT 28.7 (*)    All other components within normal limits  URINE MICROSCOPIC-ADD ON  TROPONIN I  I-STAT TROPOININ, ED    Imaging Review Ct Abdomen Pelvis Wo Contrast  04/05/2014   CLINICAL DATA:  Right-sided flank pain with history of stones.  EXAM: CT ABDOMEN AND PELVIS WITHOUT CONTRAST  TECHNIQUE: Multidetector CT imaging of the abdomen and pelvis was performed following the standard protocol without IV contrast.  COMPARISON:  MRI abdomen 03/26/2014  FINDINGS: Lower chest:  Mild basilar atelectasis.  The heart is enlarged.  Hepatobiliary: No focal hepatic lesions non contrast exam. Gallbladder is normal.  Pancreas: Pancreas is normal. No ductal dilatation. No pancreatic inflammation.  Spleen: Normal spleen.  Adrenals/urinary tract: Adrenal glands are normal. Post left nephrectomy. No nodularity in nephrectomy bed. Right kidney is normal. The right ureter and bladder are normal.  Stomach/Bowel: The stomach, small bowel, and cecum are normal. Colon and rectosigmoid colon are normal.  Vascular/Lymphatic:  Abdominal aorta is normal caliber. There is no retroperitoneal or periportal lymphadenopathy. No pelvic lymphadenopathy.  Reproductive: Post hysterectomy anatomy. No free fluid the pelvis. No pelvic lymphadenopathy.  Musculoskeletal: No aggressive osseous lesion.  Other: No free fluid.  Inflammation.  IMPRESSION: 1. Normal right kidney on noncontrast exam. No ureterolithiasis or nephrolithiasis. Bladder appears normal. 2. Left nephrectomy without complication. 3. Post hysterectomy   Electronically Signed   By: Suzy Bouchard M.D.   On: 04/05/2014 22:06     EKG Interpretation   Date/Time:  Saturday April 05 2014 22:35:34 EST Ventricular Rate:  66 PR Interval:  173 QRS Duration: 88 QT Interval:  428 QTC Calculation: 448 R Axis:   54 Text Interpretation:  Sinus rhythm Ventricular premature complex since  last tracing no significant change Confirmed by Wilson Singer  MD, Martita Brumm ([id number])  on 04/05/2014 11:21:09 PM  MDM   Final diagnoses:  Right flank pain    89F with pain in R flank/R lateral thoracic region. Musculoskeletal? No evidence of ureteral stone or other explanatory pathology on XR. Very atypical for ACS. Doubt PE. Plan symptomatix tx at this time. Return precautions discussed.     [name], MD 04/11/14 [phone]

## 2014-04-06 MED ORDER — ONDANSETRON 4 MG PO TBDP: 4.0000 mg | ORAL_TABLET | Freq: Three times a day (TID) | ORAL | Status: DC | PRN

## 2014-04-10 ENCOUNTER — Inpatient Hospital Stay (HOSPITAL_COMMUNITY)
Admission: EM | Admit: 2014-04-10 | Discharge: 2014-04-19 | DRG: 286 | Disposition: A | Discharge status: Home Health | Payer: Medicare Other | Attending: Internal Medicine | Admitting: Internal Medicine

## 2014-04-10 ENCOUNTER — Encounter (HOSPITAL_COMMUNITY): Payer: Self-pay | Admitting: *Deleted

## 2014-04-10 DIAGNOSIS — M109 Gout, unspecified: Secondary | ICD-10-CM | POA: Diagnosis present

## 2014-04-10 DIAGNOSIS — E662 Morbid (severe) obesity with alveolar hypoventilation: Secondary | ICD-10-CM | POA: Diagnosis present

## 2014-04-10 DIAGNOSIS — R9389 Abnormal findings on diagnostic imaging of other specified body structures: Secondary | ICD-10-CM

## 2014-04-10 DIAGNOSIS — I248 Other forms of acute ischemic heart disease: Secondary | ICD-10-CM | POA: Diagnosis present

## 2014-04-10 DIAGNOSIS — Z9841 Cataract extraction status, right eye: Secondary | ICD-10-CM

## 2014-04-10 DIAGNOSIS — I5043 Acute on chronic combined systolic (congestive) and diastolic (congestive) heart failure: Secondary | ICD-10-CM | POA: Diagnosis not present

## 2014-04-10 DIAGNOSIS — Z79899 Other long term (current) drug therapy: Secondary | ICD-10-CM

## 2014-04-10 DIAGNOSIS — R001 Bradycardia, unspecified: Secondary | ICD-10-CM | POA: Diagnosis present

## 2014-04-10 DIAGNOSIS — J189 Pneumonia, unspecified organism: Secondary | ICD-10-CM | POA: Diagnosis present

## 2014-04-10 DIAGNOSIS — I501 Left ventricular failure: Secondary | ICD-10-CM | POA: Diagnosis present

## 2014-04-10 DIAGNOSIS — Z6838 Body mass index (BMI) 38.0-38.9, adult: Secondary | ICD-10-CM

## 2014-04-10 DIAGNOSIS — E876 Hypokalemia: Secondary | ICD-10-CM | POA: Diagnosis present

## 2014-04-10 DIAGNOSIS — E1165 Type 2 diabetes mellitus with hyperglycemia: Secondary | ICD-10-CM | POA: Diagnosis present

## 2014-04-10 DIAGNOSIS — I130 Hypertensive heart and chronic kidney disease with heart failure and stage 1 through stage 4 chronic kidney disease, or unspecified chronic kidney disease: Secondary | ICD-10-CM | POA: Diagnosis present

## 2014-04-10 DIAGNOSIS — N184 Chronic kidney disease, stage 4 (severe): Secondary | ICD-10-CM | POA: Diagnosis present

## 2014-04-10 DIAGNOSIS — N183 Chronic kidney disease, stage 3 unspecified: Secondary | ICD-10-CM

## 2014-04-10 DIAGNOSIS — Z885 Allergy status to narcotic agent status: Secondary | ICD-10-CM

## 2014-04-10 DIAGNOSIS — E1122 Type 2 diabetes mellitus with diabetic chronic kidney disease: Secondary | ICD-10-CM | POA: Diagnosis present

## 2014-04-10 DIAGNOSIS — Z7982 Long term (current) use of aspirin: Secondary | ICD-10-CM

## 2014-04-10 DIAGNOSIS — Z7722 Contact with and (suspected) exposure to environmental tobacco smoke (acute) (chronic): Secondary | ICD-10-CM | POA: Diagnosis present

## 2014-04-10 DIAGNOSIS — Z9842 Cataract extraction status, left eye: Secondary | ICD-10-CM

## 2014-04-10 DIAGNOSIS — D631 Anemia in chronic kidney disease: Secondary | ICD-10-CM | POA: Diagnosis present

## 2014-04-10 DIAGNOSIS — Z90710 Acquired absence of both cervix and uterus: Secondary | ICD-10-CM

## 2014-04-10 DIAGNOSIS — R0602 Shortness of breath: Secondary | ICD-10-CM | POA: Diagnosis not present

## 2014-04-10 DIAGNOSIS — Y95 Nosocomial condition: Secondary | ICD-10-CM | POA: Diagnosis present

## 2014-04-10 DIAGNOSIS — E780 Pure hypercholesterolemia: Secondary | ICD-10-CM | POA: Diagnosis present

## 2014-04-10 DIAGNOSIS — Z961 Presence of intraocular lens: Secondary | ICD-10-CM | POA: Diagnosis present

## 2014-04-10 DIAGNOSIS — E785 Hyperlipidemia, unspecified: Secondary | ICD-10-CM | POA: Diagnosis present

## 2014-04-10 DIAGNOSIS — I5033 Acute on chronic diastolic (congestive) heart failure: Secondary | ICD-10-CM | POA: Diagnosis present

## 2014-04-10 DIAGNOSIS — J9601 Acute respiratory failure with hypoxia: Secondary | ICD-10-CM | POA: Diagnosis present

## 2014-04-10 DIAGNOSIS — K219 Gastro-esophageal reflux disease without esophagitis: Secondary | ICD-10-CM | POA: Diagnosis present

## 2014-04-10 DIAGNOSIS — N179 Acute kidney failure, unspecified: Secondary | ICD-10-CM | POA: Diagnosis present

## 2014-04-10 DIAGNOSIS — E669 Obesity, unspecified: Secondary | ICD-10-CM | POA: Diagnosis present

## 2014-04-10 DIAGNOSIS — I272 Other secondary pulmonary hypertension: Secondary | ICD-10-CM | POA: Diagnosis present

## 2014-04-10 DIAGNOSIS — I10 Essential (primary) hypertension: Secondary | ICD-10-CM | POA: Diagnosis present

## 2014-04-10 DIAGNOSIS — R918 Other nonspecific abnormal finding of lung field: Secondary | ICD-10-CM

## 2014-04-10 DIAGNOSIS — T502X5A Adverse effect of carbonic-anhydrase inhibitors, benzothiadiazides and other diuretics, initial encounter: Secondary | ICD-10-CM | POA: Diagnosis present

## 2014-04-10 DIAGNOSIS — I509 Heart failure, unspecified: Secondary | ICD-10-CM

## 2014-04-10 HISTORY — DX: Unspecified diastolic (congestive) heart failure: I50.30

## 2014-04-10 MED ORDER — NITROGLYCERIN 0.4 MG SL SUBL: 0.4000 mg | SUBLINGUAL_TABLET | SUBLINGUAL | Status: DC | PRN

## 2014-04-10 MED ORDER — ASPIRIN 325 MG PO TABS
325.0000 mg | ORAL_TABLET | ORAL | Status: AC
  Administered 2014-04-11: 325 mg via ORAL
  Filled 2014-04-10: qty 1

## 2014-04-10 NOTE — ED Provider Notes (Signed)
CSN: [id number]     Arrival date & time 04/10/14  2329 History  This chart was scribed for Doris Drape, MD by Edison Simon, ED Scribe. This patient was seen in room A03C/A03C and the patient's care was started at 12:09 AM.    Chief Complaint  Patient presents with  . Shortness of Breath   HPI  HPI Comments: Doris Howell is a 78 y.o. female with history of CHF who presents to the Emergency Department complaining of SOB. She states she was hospitalized 2 months ago for CHF.  Over the last week has had SOB with exertion. She reports evaluation at [facility] 5 days ago for right flank pain which she though was a kidney stone but was told it was muscular after workup. She reports increasing SOB that began the day after, with dyspnea on exertion, orthopnea and PND.[name] She reports associated leg swelling. She states she uses 40mg  Lasix every day.  She denies any chest pain.  Patient presents with oxygen saturations of 89%, does not normally wear oxygen.  She reports that her primary care doctor is trying to get her into a sleep study.  PCP: Dr. [name] No cardiologist, per patient  Past Medical History  Diagnosis Date  . Hypertension   . High cholesterol   . Kidney stones   . Type II diabetes mellitus   . GERD (gastroesophageal reflux disease)   . History of gout   . CAP (community acquired pneumonia) 01/09/2014    "first time I've ever had it"   Past Surgical History  Procedure Laterality Date  . Kidney stone surgery  10-05-12    "cut me on the side"  . Colonoscopy with propofol N/A 10/23/2012    Procedure: COLONOSCOPY WITH PROPOFOL;  Surgeon: [name], MD;  Location: [facility];  Service: Endoscopy;  Laterality: N/A;  . Abdominal hysterectomy  ~ 1974  . Lithotripsy  1980's?  . Cataract extraction w/ intraocular lens  implant, bilateral Bilateral 2014   No family history on file. History  Substance Use Topics  . Smoking status: Never Smoker   . Smokeless tobacco: Never  Used  . Alcohol Use: No   OB History    No data available     Review of Systems   See History of Present Illness; otherwise all other systems are reviewed and negative  Allergies  Codeine  Home Medications   Prior to Admission medications   Medication Sig Start Date End Date Taking? Authorizing Provider  albuterol (PROVENTIL HFA;VENTOLIN HFA) 108 (90 BASE) MCG/ACT inhaler Inhale 2 puffs into the lungs every 6 (six) hours as needed for wheezing or shortness of breath. 01/13/14   [name], MD  allopurinol (ZYLOPRIM) 100 MG tablet Take 100 mg by mouth 2 (two) times daily.    Historical Provider, MD  amLODipine (NORVASC) 10 MG tablet Take 10 mg by mouth every morning.     Historical Provider, MD  aspirin EC 81 MG tablet Take 81 mg by mouth daily.    Historical Provider, MD  bimatoprost (LUMIGAN) 0.01 % SOLN Place 1 drop into both eyes at bedtime.     Historical Provider, MD  carvedilol (COREG) 12.5 MG tablet Take 12.5 mg by mouth 2 (two) times daily with a meal.    Historical Provider, MD  cholecalciferol (VITAMIN D) 1000 UNITS tablet Take 1,000 Units by mouth daily.    Historical Provider, MD  furosemide (LASIX) 80 MG tablet Take 0.5 tablets (40 mg total) by mouth  daily as needed (swelling). 02/13/14   [name], MD  glimepiride (AMARYL) 1 MG tablet Take 1 mg by mouth daily before breakfast.    Historical Provider, MD  levofloxacin (LEVAQUIN) 750 MG tablet Take 1 tablet (750 mg total) by mouth daily. Patient not taking: Reported on 04/05/2014 02/11/14   [name], MD  ondansetron (ZOFRAN ODT) 4 MG disintegrating tablet Take 1 tablet (4 mg total) by mouth every 8 (eight) hours as needed for nausea or vomiting. 04/06/14   [name], MD  simvastatin (ZOCOR) 20 MG tablet Take 20 mg by mouth every evening.    Historical Provider, MD  traMADol (ULTRAM) 50 MG tablet Take 1 tablet (50 mg total) by mouth every 6 (six) hours as needed. 04/05/14   [name], MD   BP  162/57 mmHg  Pulse 89  Temp(Src) 97.5 F (36.4 C) (Oral)  Ht 5\' 1"  (1.549 m)  Wt 198 lb (89.812 kg)  BMI 37.43 kg/m2  SpO2 89% Physical Exam  Constitutional: She is oriented to person, place, and time. She appears well-developed and well-nourished.  HENT:  Head: Normocephalic and atraumatic.  Nose: Nose normal.  Mouth/Throat: Oropharynx is clear and moist.  Eyes: Conjunctivae and EOM are normal. Pupils are equal, round, and reactive to light.  Neck: Normal range of motion. Neck supple. No JVD present. No tracheal deviation present. No thyromegaly present.  Cardiovascular: Normal rate, regular rhythm, normal heart sounds and intact distal pulses.  Exam reveals no gallop and no friction rub.   No murmur heard. Pulmonary/Chest: Effort normal. No stridor. No respiratory distress. She has wheezes. She has rales. She exhibits no tenderness.  Abdominal: Soft. Bowel sounds are normal. She exhibits no distension and no mass. There is tenderness (mild right sided abdominal pain with palpation). There is no rebound and no guarding.  Musculoskeletal: Normal range of motion. She exhibits edema (patient has pitting edema to the knees bilaterally). She exhibits no tenderness.  Lymphadenopathy:    She has no cervical adenopathy.  Neurological: She is alert and oriented to person, place, and time. She displays normal reflexes. She exhibits normal muscle tone. Coordination normal.  Skin: Skin is warm and dry. No rash noted. No erythema. No pallor.  Psychiatric: She has a normal mood and affect. Her behavior is normal. Judgment and thought content normal.    ED Course  Procedures (including critical care time)  DIAGNOSTIC STUDIES: Oxygen Saturation is 92% on nasal canula, adequate by my interpretation.    COORDINATION OF CARE: 12:16 AM Discussed treatment plan with patient at beside, the patient agrees with the plan and has no further questions at this time.   Labs Review Labs Reviewed  BRAIN  NATRIURETIC PEPTIDE - Abnormal; Notable for the following:    B Natriuretic Peptide 909.9 (*)    All other components within normal limits  BASIC METABOLIC PANEL - Abnormal; Notable for the following:    Potassium 3.2 (*)    Glucose, Bld 187 (*)    BUN 40 (*)    Creatinine, Ser 2.12 (*)    GFR calc non Af Amer 22 (*)    GFR calc Af Amer 25 (*)    All other components within normal limits  CBC - Abnormal; Notable for the following:    RBC 3.30 (*)    Hemoglobin 9.3 (*)    HCT 29.0 (*)    All other components within normal limits  I-STAT TROPOININ, ED    Imaging Review Dg Chest 2  View  04/11/2014   CLINICAL DATA:  Shortness of breath  EXAM: CHEST  2 VIEW  COMPARISON:  02/08/2014  FINDINGS: Dense irregularly-shaped opacities in the upper lungs which are new. Trace pleural effusions. Chronic moderate cardiomegaly and aortic tortuosity.  IMPRESSION: Biapical airspace is primarily concerning for pneumonia, but a prior episode of CHF had a similar appearance. As well, there are small pleural effusions. Suggest follow-up after diuresis.   Electronically Signed   By: Jorje Guild M.D.   On: 04/11/2014 00:44     EKG Interpretation   Date/Time:  Thursday April 10 2014 23:42:21 EST Ventricular Rate:  88 PR Interval:  128 QRS Duration: 90 QT Interval:  394 QTC Calculation: 476 R Axis:   61 Text Interpretation:  Sinus bradycardia with frequent Premature  ventricular complexes Cannot rule out Anterior infarct , age undetermined  ST \\T \ T wave abnormality, consider inferior ischemia Abnormal ECG  increased PVC from prior ekg Confirmed by Darnise Montag  MD, Naisha Wisdom ([id number]) on  04/10/2014 11:58:34 PM      MDM   Final diagnoses:  SOB (shortness of breath)  CHF exacerbation  CKD (chronic kidney disease), stage III    78 year old female with ongoing shortness of breath for about a week, worsening over the last 3 days.  Patient has history of congestive heart failure, admission in October.   Patient seen in the emergency department 12/26 due to right flank pain, was concerned was stone.  Patient describes PND, orthopnea, dyspnea on exertion.  She has significant edema to knees.  Wheezing on exam.  Most likely cardiac.  Plan for labs, chest x-ray, IV dosing of Lasix.  I personally performed the services described in this documentation, which was scribed in my presence. The recorded information has been reviewed and is accurate.  1:23 AM Question of pna on cxr, but no recent productive cough, fevers.  Pt to be admitted.    Doris Drape, MD 04/11/14 [phone]

## 2014-04-10 NOTE — ED Notes (Signed)
Pt here with increasing sob since Monday.  Pt has had non productive cough, no fever or chills, pt denies any edema.  Pt states that she has had "a little chest pain".  At this time she has some chest tightness which she attributes to being anxious about being here.  Pt 83% on RA, placed her on 2L [place], pt speaking in full sentences

## 2014-04-11 ENCOUNTER — Encounter (HOSPITAL_COMMUNITY): Payer: Self-pay | Admitting: Physician Assistant

## 2014-04-11 ENCOUNTER — Emergency Department (HOSPITAL_COMMUNITY): Payer: Medicare Other

## 2014-04-11 DIAGNOSIS — E780 Pure hypercholesterolemia: Secondary | ICD-10-CM | POA: Diagnosis present

## 2014-04-11 DIAGNOSIS — I10 Essential (primary) hypertension: Secondary | ICD-10-CM | POA: Diagnosis present

## 2014-04-11 DIAGNOSIS — Z7982 Long term (current) use of aspirin: Secondary | ICD-10-CM | POA: Diagnosis not present

## 2014-04-11 DIAGNOSIS — I5043 Acute on chronic combined systolic (congestive) and diastolic (congestive) heart failure: Secondary | ICD-10-CM | POA: Diagnosis present

## 2014-04-11 DIAGNOSIS — Z6838 Body mass index (BMI) 38.0-38.9, adult: Secondary | ICD-10-CM | POA: Diagnosis not present

## 2014-04-11 DIAGNOSIS — Z79899 Other long term (current) drug therapy: Secondary | ICD-10-CM | POA: Diagnosis not present

## 2014-04-11 DIAGNOSIS — E876 Hypokalemia: Secondary | ICD-10-CM | POA: Diagnosis present

## 2014-04-11 DIAGNOSIS — E1165 Type 2 diabetes mellitus with hyperglycemia: Secondary | ICD-10-CM | POA: Diagnosis present

## 2014-04-11 DIAGNOSIS — J189 Pneumonia, unspecified organism: Secondary | ICD-10-CM | POA: Diagnosis present

## 2014-04-11 DIAGNOSIS — Z961 Presence of intraocular lens: Secondary | ICD-10-CM | POA: Diagnosis present

## 2014-04-11 DIAGNOSIS — Z7722 Contact with and (suspected) exposure to environmental tobacco smoke (acute) (chronic): Secondary | ICD-10-CM | POA: Diagnosis present

## 2014-04-11 DIAGNOSIS — E669 Obesity, unspecified: Secondary | ICD-10-CM | POA: Diagnosis present

## 2014-04-11 DIAGNOSIS — T502X5A Adverse effect of carbonic-anhydrase inhibitors, benzothiadiazides and other diuretics, initial encounter: Secondary | ICD-10-CM | POA: Diagnosis present

## 2014-04-11 DIAGNOSIS — K219 Gastro-esophageal reflux disease without esophagitis: Secondary | ICD-10-CM | POA: Diagnosis present

## 2014-04-11 DIAGNOSIS — N184 Chronic kidney disease, stage 4 (severe): Secondary | ICD-10-CM

## 2014-04-11 DIAGNOSIS — I5033 Acute on chronic diastolic (congestive) heart failure: Secondary | ICD-10-CM | POA: Diagnosis present

## 2014-04-11 DIAGNOSIS — E662 Morbid (severe) obesity with alveolar hypoventilation: Secondary | ICD-10-CM | POA: Diagnosis present

## 2014-04-11 DIAGNOSIS — E1122 Type 2 diabetes mellitus with diabetic chronic kidney disease: Secondary | ICD-10-CM | POA: Diagnosis present

## 2014-04-11 DIAGNOSIS — M109 Gout, unspecified: Secondary | ICD-10-CM | POA: Diagnosis present

## 2014-04-11 DIAGNOSIS — E118 Type 2 diabetes mellitus with unspecified complications: Secondary | ICD-10-CM

## 2014-04-11 DIAGNOSIS — Z90710 Acquired absence of both cervix and uterus: Secondary | ICD-10-CM | POA: Diagnosis not present

## 2014-04-11 DIAGNOSIS — I248 Other forms of acute ischemic heart disease: Secondary | ICD-10-CM | POA: Diagnosis present

## 2014-04-11 DIAGNOSIS — R001 Bradycardia, unspecified: Secondary | ICD-10-CM | POA: Diagnosis present

## 2014-04-11 DIAGNOSIS — N179 Acute kidney failure, unspecified: Secondary | ICD-10-CM | POA: Diagnosis present

## 2014-04-11 DIAGNOSIS — I501 Left ventricular failure: Secondary | ICD-10-CM | POA: Diagnosis present

## 2014-04-11 DIAGNOSIS — Y95 Nosocomial condition: Secondary | ICD-10-CM | POA: Diagnosis present

## 2014-04-11 DIAGNOSIS — Z885 Allergy status to narcotic agent status: Secondary | ICD-10-CM | POA: Diagnosis not present

## 2014-04-11 DIAGNOSIS — E785 Hyperlipidemia, unspecified: Secondary | ICD-10-CM | POA: Diagnosis present

## 2014-04-11 DIAGNOSIS — R0602 Shortness of breath: Secondary | ICD-10-CM | POA: Diagnosis present

## 2014-04-11 DIAGNOSIS — Z9842 Cataract extraction status, left eye: Secondary | ICD-10-CM | POA: Diagnosis not present

## 2014-04-11 DIAGNOSIS — J9601 Acute respiratory failure with hypoxia: Secondary | ICD-10-CM | POA: Diagnosis present

## 2014-04-11 DIAGNOSIS — I272 Other secondary pulmonary hypertension: Secondary | ICD-10-CM | POA: Diagnosis present

## 2014-04-11 DIAGNOSIS — Z9841 Cataract extraction status, right eye: Secondary | ICD-10-CM | POA: Diagnosis not present

## 2014-04-11 DIAGNOSIS — I130 Hypertensive heart and chronic kidney disease with heart failure and stage 1 through stage 4 chronic kidney disease, or unspecified chronic kidney disease: Secondary | ICD-10-CM | POA: Diagnosis present

## 2014-04-11 DIAGNOSIS — D631 Anemia in chronic kidney disease: Secondary | ICD-10-CM | POA: Diagnosis present

## 2014-04-11 LAB — COMPREHENSIVE METABOLIC PANEL
ALT: 18 U/L (ref 0–35)
AST: 26 U/L (ref 0–37)
Albumin: 3.2 g/dL — ABNORMAL LOW (ref 3.5–5.2)
Alkaline Phosphatase: 86 U/L (ref 39–117)
Anion gap: 12 (ref 5–15)
BUN: 39 mg/dL — ABNORMAL HIGH (ref 6–23)
CO2: 25 mmol/L (ref 19–32)
Calcium: 9.3 mg/dL (ref 8.4–10.5)
Chloride: 104 mEq/L (ref 96–112)
Creatinine, Ser: 2.05 mg/dL — ABNORMAL HIGH (ref 0.50–1.10)
GFR calc Af Amer: 26 mL/min — ABNORMAL LOW (ref >90)
GFR calc non Af Amer: 22 mL/min — ABNORMAL LOW (ref >90)
Glucose, Bld: 131 mg/dL — ABNORMAL HIGH (ref 70–99)
Potassium: 3.3 mmol/L — ABNORMAL LOW (ref 3.5–5.1)
Sodium: 141 mmol/L (ref 135–145)
Total Bilirubin: 0.5 mg/dL (ref 0.3–1.2)
Total Protein: 7.3 g/dL (ref 6.0–8.3)

## 2014-04-11 LAB — CBC
HCT: 27.4 % — ABNORMAL LOW (ref 36.0–46.0)
HCT: 29 % — ABNORMAL LOW (ref 36.0–46.0)
Hemoglobin: 8.9 g/dL — ABNORMAL LOW (ref 12.0–15.0)
Hemoglobin: 9.3 g/dL — ABNORMAL LOW (ref 12.0–15.0)
MCH: 28.2 pg (ref 26.0–34.0)
MCH: 29.5 pg (ref 26.0–34.0)
MCHC: 32.1 g/dL (ref 30.0–36.0)
MCHC: 32.5 g/dL (ref 30.0–36.0)
MCV: 87.9 fL (ref 78.0–100.0)
MCV: 90.7 fL (ref 78.0–100.0)
Platelets: 278 10*3/uL (ref 150–400)
Platelets: 279 10*3/uL (ref 150–400)
RBC: 3.02 MIL/uL — ABNORMAL LOW (ref 3.87–5.11)
RBC: 3.3 MIL/uL — ABNORMAL LOW (ref 3.87–5.11)
RDW: 14.9 % (ref 11.5–15.5)
RDW: 15.2 % (ref 11.5–15.5)
WBC: 7 10*3/uL (ref 4.0–10.5)
WBC: 8.9 10*3/uL (ref 4.0–10.5)

## 2014-04-11 LAB — GLUCOSE, CAPILLARY
Glucose-Capillary: 115 mg/dL — ABNORMAL HIGH (ref 70–99)
Glucose-Capillary: 144 mg/dL — ABNORMAL HIGH (ref 70–99)
Glucose-Capillary: 86 mg/dL (ref 70–99)
Glucose-Capillary: 157 mg/dL — ABNORMAL HIGH (ref 70–99)

## 2014-04-11 LAB — BASIC METABOLIC PANEL
Anion gap: 10 (ref 5–15)
BUN: 40 mg/dL — ABNORMAL HIGH (ref 6–23)
CO2: 26 mmol/L (ref 19–32)
Calcium: 9.7 mg/dL (ref 8.4–10.5)
Chloride: 103 mEq/L (ref 96–112)
Creatinine, Ser: 2.12 mg/dL — ABNORMAL HIGH (ref 0.50–1.10)
GFR calc Af Amer: 25 mL/min — ABNORMAL LOW (ref >90)
GFR calc non Af Amer: 22 mL/min — ABNORMAL LOW (ref >90)
Glucose, Bld: 187 mg/dL — ABNORMAL HIGH (ref 70–99)
Potassium: 3.2 mmol/L — ABNORMAL LOW (ref 3.5–5.1)
Sodium: 139 mmol/L (ref 135–145)

## 2014-04-11 LAB — INFLUENZA PANEL BY PCR (TYPE A & B)
H1N1 flu by pcr: NOT DETECTED
Influenza A By PCR: NEGATIVE
Influenza B By PCR: NEGATIVE

## 2014-04-11 LAB — BRAIN NATRIURETIC PEPTIDE: B Natriuretic Peptide: 909.9 pg/mL — ABNORMAL HIGH (ref 0.0–100.0)

## 2014-04-11 LAB — I-STAT TROPONIN, ED: Troponin i, poc: 0.04 ng/mL (ref 0.00–0.08)

## 2014-04-11 LAB — STREP PNEUMONIAE URINARY ANTIGEN: Strep Pneumo Urinary Antigen: NEGATIVE

## 2014-04-11 LAB — PROTIME-INR
INR: 1.14 (ref 0.00–1.49)
Prothrombin Time: 14.8 seconds (ref 11.6–15.2)

## 2014-04-11 LAB — TROPONIN I: Troponin I: 0.06 ng/mL — ABNORMAL HIGH (ref <0.031)

## 2014-04-11 MED ORDER — CARVEDILOL 6.25 MG PO TABS
6.2500 mg | ORAL_TABLET | Freq: Two times a day (BID) | ORAL | Status: DC
  Administered 2014-04-12 – 2014-04-19 (×15): 6.25 mg via ORAL
  Filled 2014-04-11 (×17): qty 1

## 2014-04-11 MED ORDER — LATANOPROST 0.005 % OP SOLN
1.0000 [drp] | Freq: Every day | OPHTHALMIC | Status: DC
  Administered 2014-04-11 – 2014-04-18 (×8): 1 [drp] via OPHTHALMIC
  Filled 2014-04-11: qty 2.5

## 2014-04-11 MED ORDER — INSULIN ASPART 100 UNIT/ML ~~LOC~~ SOLN
0.0000 [IU] | Freq: Three times a day (TID) | SUBCUTANEOUS | Status: DC
  Administered 2014-04-11: 2 [IU] via SUBCUTANEOUS
  Administered 2014-04-12 – 2014-04-13 (×3): 3 [IU] via SUBCUTANEOUS
  Administered 2014-04-13: 5 [IU] via SUBCUTANEOUS
  Administered 2014-04-13: 3 [IU] via SUBCUTANEOUS
  Administered 2014-04-14 (×2): 5 [IU] via SUBCUTANEOUS
  Administered 2014-04-14: 8 [IU] via SUBCUTANEOUS
  Administered 2014-04-15: 5 [IU] via SUBCUTANEOUS
  Administered 2014-04-16: 3 [IU] via SUBCUTANEOUS
  Administered 2014-04-16: 5 [IU] via SUBCUTANEOUS
  Administered 2014-04-16: 11 [IU] via SUBCUTANEOUS
  Administered 2014-04-17 (×2): 2 [IU] via SUBCUTANEOUS
  Administered 2014-04-17: 5 [IU] via SUBCUTANEOUS
  Administered 2014-04-18 (×2): 3 [IU] via SUBCUTANEOUS

## 2014-04-11 MED ORDER — ACETAMINOPHEN 325 MG PO TABS
650.0000 mg | ORAL_TABLET | Freq: Four times a day (QID) | ORAL | Status: DC | PRN
  Filled 2014-04-11: qty 2

## 2014-04-11 MED ORDER — IPRATROPIUM-ALBUTEROL 0.5-2.5 (3) MG/3ML IN SOLN
3.0000 mL | Freq: Once | RESPIRATORY_TRACT | Status: AC
  Administered 2014-04-11: 3 mL via RESPIRATORY_TRACT
  Filled 2014-04-11: qty 3

## 2014-04-11 MED ORDER — AMLODIPINE BESYLATE 10 MG PO TABS
10.0000 mg | ORAL_TABLET | Freq: Every morning | ORAL | Status: DC
  Administered 2014-04-11 – 2014-04-19 (×9): 10 mg via ORAL
  Filled 2014-04-11 (×9): qty 1

## 2014-04-11 MED ORDER — ALBUTEROL SULFATE (2.5 MG/3ML) 0.083% IN NEBU: 2.5000 mg | INHALATION_SOLUTION | Freq: Four times a day (QID) | RESPIRATORY_TRACT | Status: DC

## 2014-04-11 MED ORDER — ONDANSETRON HCL 4 MG PO TABS: 4.0000 mg | ORAL_TABLET | Freq: Four times a day (QID) | ORAL | Status: DC | PRN

## 2014-04-11 MED ORDER — NITROGLYCERIN 2 % TD OINT
0.5000 [in_us] | TOPICAL_OINTMENT | Freq: Once | TRANSDERMAL | Status: AC
  Administered 2014-04-11: 0.5 [in_us] via TOPICAL
  Filled 2014-04-11: qty 1

## 2014-04-11 MED ORDER — ONDANSETRON HCL 4 MG/2ML IJ SOLN
4.0000 mg | Freq: Four times a day (QID) | INTRAMUSCULAR | Status: DC | PRN
  Administered 2014-04-17 – 2014-04-18 (×2): 4 mg via INTRAVENOUS
  Filled 2014-04-11 (×3): qty 2

## 2014-04-11 MED ORDER — FUROSEMIDE 10 MG/ML IJ SOLN
40.0000 mg | Freq: Once | INTRAMUSCULAR | Status: AC
  Administered 2014-04-11: 40 mg via INTRAVENOUS
  Filled 2014-04-11: qty 4

## 2014-04-11 MED ORDER — ACETAMINOPHEN 650 MG RE SUPP: 650.0000 mg | Freq: Four times a day (QID) | RECTAL | Status: DC | PRN

## 2014-04-11 MED ORDER — ALLOPURINOL 100 MG PO TABS
100.0000 mg | ORAL_TABLET | Freq: Two times a day (BID) | ORAL | Status: DC
  Administered 2014-04-11 – 2014-04-19 (×17): 100 mg via ORAL
  Filled 2014-04-11 (×20): qty 1

## 2014-04-11 MED ORDER — INSULIN ASPART 100 UNIT/ML ~~LOC~~ SOLN
0.0000 [IU] | Freq: Every day | SUBCUTANEOUS | Status: DC
  Administered 2014-04-12: 3 [IU] via SUBCUTANEOUS
  Administered 2014-04-13 – 2014-04-14 (×2): 2 [IU] via SUBCUTANEOUS
  Administered 2014-04-15: 4 [IU] via SUBCUTANEOUS

## 2014-04-11 MED ORDER — TRAMADOL HCL 50 MG PO TABS
50.0000 mg | ORAL_TABLET | Freq: Four times a day (QID) | ORAL | Status: DC | PRN
  Administered 2014-04-11 – 2014-04-18 (×3): 50 mg via ORAL
  Filled 2014-04-11 (×3): qty 1

## 2014-04-11 MED ORDER — GUAIFENESIN ER 600 MG PO TB12
600.0000 mg | ORAL_TABLET | Freq: Two times a day (BID) | ORAL | Status: DC
  Administered 2014-04-11 – 2014-04-13 (×5): 600 mg via ORAL
  Filled 2014-04-11 (×6): qty 1

## 2014-04-11 MED ORDER — CARVEDILOL 12.5 MG PO TABS
12.5000 mg | ORAL_TABLET | Freq: Two times a day (BID) | ORAL | Status: DC
  Administered 2014-04-11: 12.5 mg via ORAL
  Filled 2014-04-11 (×3): qty 1

## 2014-04-11 MED ORDER — FUROSEMIDE 10 MG/ML IJ SOLN
20.0000 mg | Freq: Two times a day (BID) | INTRAMUSCULAR | Status: DC
  Administered 2014-04-11 – 2014-04-12 (×2): 20 mg via INTRAVENOUS
  Filled 2014-04-11 (×4): qty 2

## 2014-04-11 MED ORDER — ALBUTEROL SULFATE (2.5 MG/3ML) 0.083% IN NEBU: 3.0000 mL | INHALATION_SOLUTION | Freq: Four times a day (QID) | RESPIRATORY_TRACT | Status: DC | PRN

## 2014-04-11 MED ORDER — IPRATROPIUM BROMIDE 0.02 % IN SOLN
0.5000 mg | Freq: Four times a day (QID) | RESPIRATORY_TRACT | Status: DC
Start: 2014-04-11 — End: 2014-04-11

## 2014-04-11 MED ORDER — ASPIRIN EC 81 MG PO TBEC
81.0000 mg | DELAYED_RELEASE_TABLET | Freq: Every day | ORAL | Status: DC
  Administered 2014-04-11 – 2014-04-19 (×9): 81 mg via ORAL
  Filled 2014-04-11 (×9): qty 1

## 2014-04-11 MED ORDER — SODIUM CHLORIDE 0.9 % IJ SOLN
3.0000 mL | Freq: Two times a day (BID) | INTRAMUSCULAR | Status: DC
  Administered 2014-04-11 – 2014-04-19 (×17): 3 mL via INTRAVENOUS

## 2014-04-11 MED ORDER — SIMVASTATIN 20 MG PO TABS
20.0000 mg | ORAL_TABLET | Freq: Every evening | ORAL | Status: DC
  Administered 2014-04-11 – 2014-04-18 (×8): 20 mg via ORAL
  Filled 2014-04-11 (×9): qty 1

## 2014-04-11 MED ORDER — ONDANSETRON 4 MG PO TBDP
4.0000 mg | ORAL_TABLET | Freq: Three times a day (TID) | ORAL | Status: DC | PRN
  Filled 2014-04-11: qty 1

## 2014-04-11 MED ORDER — IPRATROPIUM-ALBUTEROL 0.5-2.5 (3) MG/3ML IN SOLN
3.0000 mL | Freq: Four times a day (QID) | RESPIRATORY_TRACT | Status: DC
  Administered 2014-04-11 (×3): 3 mL via RESPIRATORY_TRACT
  Filled 2014-04-11 (×3): qty 3

## 2014-04-11 MED ORDER — HEPARIN SODIUM (PORCINE) 5000 UNIT/ML IJ SOLN
5000.0000 [IU] | Freq: Three times a day (TID) | INTRAMUSCULAR | Status: DC
  Administered 2014-04-11 – 2014-04-19 (×24): 5000 [IU] via SUBCUTANEOUS
  Filled 2014-04-11 (×27): qty 1

## 2014-04-11 NOTE — Consult Note (Signed)
CARDIOLOGY CONSULT NOTE   Patient ID: Doris Howell MRN: [id number], DOB/AGE: November 30, 1935   Admit date: 04/10/2014 Date of Consult: 04/11/2014   Primary Physician: Irven Shelling, MD Primary Cardiologist: New  Pt. Profile  obese 79 year old female with history of hypertension, hyperlipidemia, diabetes, GERD, chronic kidney disease, anemia and OSA admitted for hypoxia. Cardiology consulted for diastolic HF. This is the third admission in the last 3 months  Problem List  Past Medical History  Diagnosis Date  . Hypertension   . High cholesterol   . Kidney stones   . Type II diabetes mellitus   . GERD (gastroesophageal reflux disease)   . History of gout   . CAP (community acquired pneumonia) 01/09/2014    "first time I've ever had it"    Past Surgical History  Procedure Laterality Date  . Kidney stone surgery  10-05-12    "cut me on the side"  . Colonoscopy with propofol N/A 10/23/2012    Procedure: COLONOSCOPY WITH PROPOFOL;  Surgeon: [name], MD;  Location: [facility];  Service: Endoscopy;  Laterality: N/A;  . Abdominal hysterectomy  ~ 1974  . Lithotripsy  1980's?  . Cataract extraction w/ intraocular lens  implant, bilateral Bilateral 2014     Allergies  Allergies  Allergen Reactions  . Codeine Other (See Comments)    Unknown; pt can't remember. It was in the '70s    HPI   The patient is a obese 79 year old female with history of hypertension, hyperlipidemia, diabetes, GERD, chronic kidney disease, anemia and OSA. Patient was first admitted in October 2015 for hypoxia which felt to be due to a combination of acute diastolic heart failure and possible pneumonia. Echocardiogram at that time showed EF 50-55%, peak PA pressure 32. She was diuresed and eventually discharged. She has no prior cardiac history and has never seen a cardiologist before. She returned near the end of October for secondary admission for hypoxia. VQ scan was negative for PE at  the time. She was diuresed and discharged again. Since her discharge, patient has been having significant dyspnea on exertion. She began to have increasing hypoxia at rest on Monday 04/07/2014. Since Monday, she is also been having orthopnea and paroxysmal nocturnal dyspnea. She denies any significant chest pain during the episodes. She denies any recent fever, chill, however does endorse dry cough.  She presented to [facility] on the night of 04/10/2014, and was admitted for acute on chronic diastolic heart failure and possible pneumonia. Chest x-ray was originally read as possible pneumonia, however chest x-ray finding was consistent with prior heart failure chest x-ray. Her BNP was elevated at 900. Troponin was initially negative, however repeat troponin was mildly positive. She denies any history of smoking, however she does endorse secondhand smoke from her husband. She has never seen a pulmonologist before. Cardiogenic has been consulted for heart failure.  Inpatient Medications  . allopurinol  100 mg Oral BID  . amLODipine  10 mg Oral q morning - 10a  . aspirin EC  81 mg Oral Daily  . carvedilol  12.5 mg Oral BID WC  . furosemide  20 mg Intravenous Q12H  . guaiFENesin  600 mg Oral BID  . heparin  5,000 Units Subcutaneous 3 times per day  . insulin aspart  0-15 Units Subcutaneous TID WC  . insulin aspart  0-5 Units Subcutaneous QHS  . ipratropium-albuterol  3 mL Nebulization Q6H  . simvastatin  20 mg Oral QPM  . sodium chloride  3  mL Intravenous Q12H    Family History Family History  Problem Relation Age of Onset  . Hypertension Mother   . CAD Neg Hx      Social History History   Social History  . Marital Status: Married    Spouse Name: N/A    Number of Children: N/A  . Years of Education: N/A   Occupational History  . Not on file.   Social History Main Topics  . Smoking status: Never Smoker   . Smokeless tobacco: Never Used  . Alcohol Use: No  . Drug Use: No   . Sexual Activity: Not Currently   Other Topics Concern  . Not on file   Social History Narrative     Review of Systems  General:  No chills, fever, night sweats or weight changes.  Cardiovascular:  No chest pain +dyspnea on exertion, edema, orthopnea, palpitations, paroxysmal nocturnal dyspnea. Dermatological: No rash, lesions/masses Respiratory: + dry cough, dyspnea Urologic: No hematuria, dysuria Abdominal:   No nausea, vomiting, diarrhea, bright red blood per rectum, melena, or hematemesis Neurologic:  No visual changes, wkns, changes in mental status. All other systems reviewed and are otherwise negative except as noted above.  Physical Exam  Blood pressure 132/62, pulse 53, temperature 97.6 F (36.4 C), temperature source Oral, resp. rate 20, height 5\' 1"  (1.549 m), weight 200 lb 9.6 oz (90.992 kg), SpO2 90 %.  General: Pleasant, NAD Psych: Normal affect. Neuro: Alert and oriented X 3. Moves all extremities spontaneously. HEENT: Normal  Neck: Supple without bruits or JVD. Lungs:  Resp regular and unlabored. mildly decreased breath sound, mild intermittent basilar rale, some expiratory wheezing. Heart: RRR no s3, s4, or murmurs. Abdomen: Soft, non-tender, non-distended, BS + x 4.  Extremities: No clubbing, cyanosis. DP/PT/Radials 2+ and equal bilaterally. No obvious LE edema seen  Labs   Recent Labs  04/11/14 0558  TROPONINI 0.06*   Lab Results  Component Value Date   WBC 7.0 04/11/2014   HGB 8.9* 04/11/2014   HCT 27.4* 04/11/2014   MCV 90.7 04/11/2014   PLT 278 04/11/2014     Recent Labs Lab 04/11/14 0558  NA 141  K 3.3*  CL 104  CO2 25  BUN 39*  CREATININE 2.05*  CALCIUM 9.3  PROT 7.3  BILITOT 0.5  ALKPHOS 86  ALT 18  AST 26  GLUCOSE 131*   No results found for: CHOL, HDL, LDLCALC, TRIG No results found for: DDIMER  Radiology/Studies  Ct Abdomen Pelvis Wo Contrast  04/05/2014   CLINICAL DATA:  Right-sided flank pain with history of  stones.  EXAM: CT ABDOMEN AND PELVIS WITHOUT CONTRAST  TECHNIQUE: Multidetector CT imaging of the abdomen and pelvis was performed following the standard protocol without IV contrast.  COMPARISON:  MRI abdomen 03/26/2014  FINDINGS: Lower chest:  Mild basilar atelectasis.  The heart is enlarged.  Hepatobiliary: No focal hepatic lesions non contrast exam. Gallbladder is normal.  Pancreas: Pancreas is normal. No ductal dilatation. No pancreatic inflammation.  Spleen: Normal spleen.  Adrenals/urinary tract: Adrenal glands are normal. Post left nephrectomy. No nodularity in nephrectomy bed. Right kidney is normal. The right ureter and bladder are normal.  Stomach/Bowel: The stomach, small bowel, and cecum are normal. Colon and rectosigmoid colon are normal.  Vascular/Lymphatic: Abdominal aorta is normal caliber. There is no retroperitoneal or periportal lymphadenopathy. No pelvic lymphadenopathy.  Reproductive: Post hysterectomy anatomy. No free fluid the pelvis. No pelvic lymphadenopathy.  Musculoskeletal: No aggressive osseous lesion.  Other: No free fluid.  Inflammation.  IMPRESSION: 1. Normal right kidney on noncontrast exam. No ureterolithiasis or nephrolithiasis. Bladder appears normal. 2. Left nephrectomy without complication. 3. Post hysterectomy   Electronically Signed   By: Suzy Bouchard M.D.   On: 04/05/2014 22:06   Dg Chest 2 View  04/11/2014   CLINICAL DATA:  Shortness of breath  EXAM: CHEST  2 VIEW  COMPARISON:  02/08/2014  FINDINGS: Dense irregularly-shaped opacities in the upper lungs which are new. Trace pleural effusions. Chronic moderate cardiomegaly and aortic tortuosity.  IMPRESSION: Biapical airspace is primarily concerning for pneumonia, but a prior episode of CHF had a similar appearance. As well, there are small pleural effusions. Suggest follow-up after diuresis.   Electronically Signed   By: Jorje Guild M.D.   On: 04/11/2014 00:44   Mr Abdomen Wo Contrast  03/26/2014   CLINICAL  DATA:  Evaluate pancreas cyst  EXAM: MRI ABDOMEN WITHOUT CONTRAST  TECHNIQUE: Multiplanar multisequence MR imaging was performed without the administration of intravenous contrast.  COMPARISON:  10/29/2013  FINDINGS: Lower chest: There is no pleural or pericardial effusion identified. The heart size appears moderately enlarged.  Hepatobiliary: On the diffusion weighted images the previously described hypervascular liver lesions within the left hepatic lobe are identified and appears stable measuring 1 cm, image number 18/series 8. T2 hyperintense structure in the left hepatic lobe is unchanged , image number 9/series 7.  Pancreas: Numerous unilocular, T2 hyperintense structures are identified within the pancreas. The largest is in the body of pancreas measuring 1.4 cm, image 17/series 6. This is unchanged from previous exam the index lesion within the tail of pancreas is also stable measuring 1.1 cm, image 11/series 6. Several T2 hyperintense structures are noted within the uncinate process. The largest measures 1.1 cm, image 13/series 6. These are unchanged from previous exam.  Spleen: The spleen is normal.  Adrenals/Urinary Tract: The adrenal glands are both normal. Status post left nephrectomy. Normal appearance of the right kidney.  Stomach/Bowel: The visualized upper abdominal bowel loops and stomach appear within normal limits.  Vascular/Lymphatic: Normal caliber of the abdominal aorta. No adenopathy identified within the upper abdomen.  Other: No free fluid or fluid collections within the upper abdomen.  Musculoskeletal: No abnormal areas of signal from within the bone marrow.  IMPRESSION: 1. Overall stable exam compared with 10/29/2013. 2. Although performed to without IV contrast material the previously described hypervascular liver lesions (identified on diffusion weighted sequences) are unchanged from previous exam favoring a benign abnormality. 3. Cysts within the liver and pancreas are stable from  previous exam. A single followup examination at 12 months (10/29/2013) is recommended to confirm stability.   Electronically Signed   By: Kerby Moors M.D.   On: 03/26/2014 12:09    ECG  NSR with PVCs, poor R wave progression  ASSESSMENT AND PLAN  1. Hypoxia both at rest and with exertion  - likely component of acute on chronic diastolic HF, OSA, body habitus and pulmonary issue  - does not appear to be too fluid overloaded on exam, can do 40mg  daily or BID IV lasix, monitor renal function  - however likely felt to have a component of pulmonary issue, may check with pulmonology as well.  2. Bradycardia  - cut coreg down to 6.25mg  as patient has been bradycardia although asymptomatic  3. Mildly elevated trop: likely related to hypoxia, no obvious EKG changes.   - given recurrent HF symptom, may need outpatient lexiscan myoview, however unlikely to need it during this admission  4. Hypertension 5. Hyperlipidemia 6. DM  7. GERD 8. chronic kidney disease 9. Anemia 10. OSA   Signed, Almyra Deforest, PA-C 04/11/2014, 3:44 PM Patient was seen with Almyra Deforest PA-C.  This is her third admission in several months for similar presentation.  On her last presentation, a search for pulmonary emboli was made and her VQ scan was negative.  She appears to have elements of both acute on chronic diastolic heart failure as well as probable underlying pulmonary disease.  She is a nonsmoker but has been exposed to a lot of secondhand smoke.  Her exertional dyspnea has become much worse over the past several years.  She has significant expiratory wheezing on examination today.  Her chest x-ray is very unusual and shows bilateral pneumonia densities concerning for pneumonia. Agree with assessment and plan as noted above.

## 2014-04-11 NOTE — Progress Notes (Signed)
Subjective: Patient admitted this morning, with shortness of breath. She has combined systolic and diastolic CHF. Filed Vitals:   04/11/14 1004  BP: 136/59  Pulse: 75  Temp: 97.4 F (36.3 C)  Resp: 22    Chest: Bibasilar crackles Heart : S1S2 RRR Abdomen: Soft, nontender Ext : No edema Neuro: Alert, oriented x 3  A/P Acute combined systolic and diastolic heart failure Will obtain cardiology consultation. Continue Lasix.    [place] Hospitalist [phone]

## 2014-04-11 NOTE — H&P (Addendum)
[facility] History and Physical  Patient: Doris Howell  [id number]  DOB: 30-Nov-1935  DOS: the patient was seen and examined on 04/11/2014 PCP: Irven Shelling, MD  Chief Complaint: Shortness of breath  HPI: Doris Howell is a 79 y.o. female with Past medical history of hypertension, dyslipidemia, diabetes mellitus, GERD, gout. The patient presented with complaints of shortness of breath that has been ongoing since last 4-5 days and progressively worsening. Patient mentions she has shortness of breath on rest as well as on exertion. She also has more shortness of breath when she is lying down flat. She complains of waking up in the middle of the night for air as last 3 days. She mentions she is compliant with all her medications and is taking Lasix daily. She denies any burning urination or diarrhea or constipation. She denies any nausea but did have an episode of vomiting a few days ago. She denies any acid reflux. Does not have any complaint of headache or dizziness or lightheadedness or focal deficit. She is exposed to secondhand smoke. She also complains of right upper quadrant pain which is going on her back and feels like a sharp pain. She was seen in the ER 4 days ago with this complaint and CT of the abdomen was negative and she was sent home on tramadol.   The patient is coming from home. And at her baseline independent for most of her ADL.  Review of Systems: as mentioned in the history of present illness.  A Comprehensive review of the other systems is negative.  Past Medical History  Diagnosis Date  . Hypertension   . High cholesterol   . Kidney stones   . Type II diabetes mellitus   . GERD (gastroesophageal reflux disease)   . History of gout   . CAP (community acquired pneumonia) 01/09/2014    "first time I've ever had it"   Past Surgical History  Procedure Laterality Date  . Kidney stone surgery  10-05-12    "cut me on the side"  .  Colonoscopy with propofol N/A 10/23/2012    Procedure: COLONOSCOPY WITH PROPOFOL;  Surgeon: [name], MD;  Location: [facility];  Service: Endoscopy;  Laterality: N/A;  . Abdominal hysterectomy  ~ 1974  . Lithotripsy  1980's?  . Cataract extraction w/ intraocular lens  implant, bilateral Bilateral 2014   Social History:  reports that she has never smoked. She has never used smokeless tobacco. She reports that she does not drink alcohol or use illicit drugs.  Allergies  Allergen Reactions  . Codeine Other (See Comments)    Unknown; pt can't remember. It was in the '70s    No family history on file.  Prior to Admission medications   Medication Sig Start Date End Date Taking? Authorizing Provider  albuterol (PROVENTIL HFA;VENTOLIN HFA) 108 (90 BASE) MCG/ACT inhaler Inhale 2 puffs into the lungs every 6 (six) hours as needed for wheezing or shortness of breath. 01/13/14  Yes [name], MD  allopurinol (ZYLOPRIM) 100 MG tablet Take 100 mg by mouth 2 (two) times daily.   Yes Historical Provider, MD  amLODipine (NORVASC) 10 MG tablet Take 10 mg by mouth every morning.    Yes Historical Provider, MD  aspirin EC 81 MG tablet Take 81 mg by mouth daily.   Yes Historical Provider, MD  bimatoprost (LUMIGAN) 0.01 % SOLN Place 1 drop into both eyes at bedtime.    Yes Historical Provider, MD  carvedilol (COREG) 12.5 MG  tablet Take 12.5 mg by mouth 2 (two) times daily with a meal.   Yes Historical Provider, MD  cholecalciferol (VITAMIN D) 1000 UNITS tablet Take 1,000 Units by mouth daily.   Yes Historical Provider, MD  furosemide (LASIX) 80 MG tablet Take 0.5 tablets (40 mg total) by mouth daily as needed (swelling). 02/13/14  Yes [name], MD  glimepiride (AMARYL) 1 MG tablet Take 1 mg by mouth daily before breakfast.   Yes Historical Provider, MD  ondansetron (ZOFRAN ODT) 4 MG disintegrating tablet Take 1 tablet (4 mg total) by mouth every 8 (eight) hours as needed for nausea or  vomiting. 04/06/14  Yes [name], MD  simvastatin (ZOCOR) 20 MG tablet Take 20 mg by mouth every evening.   Yes Historical Provider, MD  traMADol (ULTRAM) 50 MG tablet Take 1 tablet (50 mg total) by mouth every 6 (six) hours as needed. 04/05/14  Yes [name], MD  levofloxacin (LEVAQUIN) 750 MG tablet Take 1 tablet (750 mg total) by mouth daily. Patient not taking: Reported on 04/05/2014 02/11/14   [name], MD    Physical Exam: Filed Vitals:   04/11/14 0135 04/11/14 0230 04/11/14 0300 04/11/14 0323  BP: 151/69 133/72 141/75 154/73  Pulse: 77 79 75 83  Temp:    97.9 F (36.6 C)  TempSrc:    Oral  Resp: 23 24 20 20   Height:      Weight:    90.992 kg (200 lb 9.6 oz)  SpO2: 92% 92% 94% 100%    General: Alert, Awake and Oriented to Time, Place and Person. Appear in mild distress Eyes: PERRL ENT: Oral Mucosa clear moist. Neck: Difficult to assess  JVD Cardiovascular: S1 and S2 Present, aortic systolic  Murmur, Peripheral Pulses Present Respiratory: Bilateral Air entry equal and Decreased, bilateral Crackles, bilateral extensive expiratory  wheezes Abdomen: Bowel Sound present, Soft and non tender Skin: no Rash Extremities: Bilateral trace  Pedal edema, no calf tenderness Neurologic: Grossly no focal neuro deficit.  Labs on Admission:  CBC:  Recent Labs Lab 04/05/14 1900 04/10/14 2350  WBC 5.4 8.9  HGB 9.0* 9.3*  HCT 28.7* 29.0*  MCV 91.7 87.9  PLT 249 279    CMP     Component Value Date/Time   NA 139 04/10/2014 2350   K 3.2* 04/10/2014 2350   CL 103 04/10/2014 2350   CO2 26 04/10/2014 2350   GLUCOSE 187* 04/10/2014 2350   BUN 40* 04/10/2014 2350   CREATININE 2.12* 04/10/2014 2350   CALCIUM 9.7 04/10/2014 2350   PROT 7.8 04/05/2014 1900   ALBUMIN 3.9 04/05/2014 1900   AST 34 04/05/2014 1900   ALT 20 04/05/2014 1900   ALKPHOS 99 04/05/2014 1900   BILITOT 0.1* 04/05/2014 1900   GFRNONAA 22* 04/10/2014 2350   GFRAA 25* 04/10/2014 2350    No  results for input(s): LIPASE, AMYLASE in the last 168 hours. No results for input(s): AMMONIA in the last 168 hours.  No results for input(s): CKTOTAL, CKMB, CKMBINDEX, TROPONINI in the last 168 hours. BNP (last 3 results)  Recent Labs  01/09/14 1515 02/05/14 1216  PROBNP 2595.0* 2767.0*    Radiological Exams on Admission: Dg Chest 2 View  04/11/2014   CLINICAL DATA:  Shortness of breath  EXAM: CHEST  2 VIEW  COMPARISON:  02/08/2014  FINDINGS: Dense irregularly-shaped opacities in the upper lungs which are new. Trace pleural effusions. Chronic moderate cardiomegaly and aortic tortuosity.  IMPRESSION: Biapical airspace is primarily concerning for  pneumonia, but a prior episode of CHF had a similar appearance. As well, there are small pleural effusions. Suggest follow-up after diuresis.   Electronically Signed   By: Jorje Guild M.D.   On: 04/11/2014 00:44   EKG: Independently reviewed. normal sinus rhythm, nonspecific ST and T waves changes.  Assessment/Plan Principal Problem:   Acute on chronic combined systolic and diastolic heart failure Active Problems:   CKD (chronic kidney disease), stage III   Diabetes mellitus   Essential hypertension   Gout   1. Acute on chronic combined systolic and diastolic heart failure The patient is presenting with complaint of shortness of breath with orthopnea and PND ongoing since last 3-4 days and progressively worsening. Patient is requiring 2 L of oxygen at present. Chest x-ray shows possible pneumonia versus pulmonary congestion based on prior chest x-ray. She does not have any leukocytosis or fever does possibility of pneumonia is less likely and this is highly likely a heart failure. Possible etiology is not clear but currently I would continue monitoring her on telemetry, monitor troponin, monitor daily weight and ins and outs. Continue her regular medications. Continue with Lasix 40 mg daily. Nitroglycerin ointment at  present.  2.Possible healthcare associated pneumonia. Possibility of age Cannot be ruled out although the patient does not have any fever or chills does not have any chest pain does not have any focal infiltrate. Patient did have similar admission last time and workup was negative. Currently I would check serum influenza, Legionella, and sputum culture and treat her if there is any positive finding or fever or no improvement in her current condition.  3.Diabetes mellitus. Continuing patient on sliding scale.  4.Chronic kidney disease. Serum creatinine at his stay his baseline. Continue home gout medication.   Advance goals of care discussion: Full code   DVT Prophylaxis: subcutaneous Heparin Nutrition: Cardiac and diabetic diet  Family Communication: Family sisters  was present at bedside, opportunity was given to ask question and all questions were answered satisfactorily at the time of interview. Disposition: Admitted to inpatient in telemetry unit.  Author: Berle Mull, MD [facility] Pager: [phone] 04/11/2014, 4:29 AM    If 7PM-7AM, please contact night-coverage [email or web address] Password [email or web address]

## 2014-04-11 NOTE — Progress Notes (Signed)
Respiratory assessment done. Patient having SOB and increased WOB due to heart issues, and after exertions. Even during SOB and WOB episode patient has clear BBS with no wheezes. And episode subsides after patient comfortable. Changing to Prn per protocol and will reassess Prn for any changes.

## 2014-04-11 NOTE — Progress Notes (Signed)
At this time, patient's HR ranging from 47-70. Patient in a Sinus bradycardia to Normal sinus rhythm. Patient is asymptomatic and has no complaints at this time. Dr. Darrick Meigs paged to make aware. Will continue to monitor.

## 2014-04-11 NOTE — Progress Notes (Signed)
Return page received from Dr. Darrick Meigs. Made aware of patient HR dropping. New parameters received for Carvedilol. Will continue to monitor.

## 2014-04-11 NOTE — ED Notes (Signed)
Called Xray to get imaging completed prior to Nebulizer and Lasix.

## 2014-04-12 DIAGNOSIS — R0602 Shortness of breath: Secondary | ICD-10-CM | POA: Diagnosis present

## 2014-04-12 LAB — GLUCOSE, CAPILLARY
Glucose-Capillary: 71 mg/dL (ref 70–99)
Glucose-Capillary: 175 mg/dL — ABNORMAL HIGH (ref 70–99)
Glucose-Capillary: 153 mg/dL — ABNORMAL HIGH (ref 70–99)
Glucose-Capillary: 278 mg/dL — ABNORMAL HIGH (ref 70–99)

## 2014-04-12 MED ORDER — IPRATROPIUM-ALBUTEROL 0.5-2.5 (3) MG/3ML IN SOLN: 3.0000 mL | RESPIRATORY_TRACT | Status: DC

## 2014-04-12 MED ORDER — METHYLPREDNISOLONE SODIUM SUCC 125 MG IJ SOLR
60.0000 mg | Freq: Four times a day (QID) | INTRAMUSCULAR | Status: DC
  Administered 2014-04-12 – 2014-04-13 (×5): 60 mg via INTRAVENOUS
  Filled 2014-04-12 (×3): qty 0.96
  Filled 2014-04-12: qty 2
  Filled 2014-04-12 (×2): qty 0.96
  Filled 2014-04-12 (×3): qty 2

## 2014-04-12 MED ORDER — LEVOFLOXACIN IN D5W 250 MG/50ML IV SOLN
250.0000 mg | INTRAVENOUS | Status: DC
  Administered 2014-04-13 – 2014-04-14 (×2): 250 mg via INTRAVENOUS
  Filled 2014-04-12 (×3): qty 50

## 2014-04-12 MED ORDER — BISACODYL 10 MG RE SUPP
10.0000 mg | Freq: Every day | RECTAL | Status: DC | PRN
  Administered 2014-04-16: 10 mg via RECTAL

## 2014-04-12 MED ORDER — LEVOFLOXACIN IN D5W 500 MG/100ML IV SOLN
500.0000 mg | INTRAVENOUS | Status: DC
  Administered 2014-04-12: 500 mg via INTRAVENOUS
  Filled 2014-04-12 (×2): qty 100

## 2014-04-12 MED ORDER — POTASSIUM CHLORIDE CRYS ER 20 MEQ PO TBCR
40.0000 meq | EXTENDED_RELEASE_TABLET | Freq: Once | ORAL | Status: AC
  Administered 2014-04-12: 40 meq via ORAL
  Filled 2014-04-12: qty 2

## 2014-04-12 MED ORDER — FUROSEMIDE 10 MG/ML IJ SOLN
20.0000 mg | Freq: Two times a day (BID) | INTRAMUSCULAR | Status: DC
  Administered 2014-04-12 – 2014-04-13 (×3): 20 mg via INTRAVENOUS
  Filled 2014-04-12 (×3): qty 2

## 2014-04-12 MED ORDER — MAGNESIUM HYDROXIDE 400 MG/5ML PO SUSP
15.0000 mL | Freq: Every day | ORAL | Status: DC | PRN
  Administered 2014-04-12: 15 mL via ORAL
  Filled 2014-04-12: qty 30

## 2014-04-12 NOTE — Progress Notes (Signed)
PROGRESS NOTE  Subjective:   Pt is a 79 yo with hx of HTN, DM,   Obesity, OAS She has been admitted 3 times in recent months for dyspnea.  Echo shows normal LV systolic function, mild LVH.   Panting for breath when I went in this am   Objective:    Vital Signs:   Temp:  [97.6 F (36.4 C)-98.7 F (37.1 C)] 98.4 F (36.9 C) (01/02 0930) Pulse Rate:  [53-80] 70 (01/02 0930) Resp:  [20-22] 20 (01/02 0930) BP: (130-137)/(48-78) 135/48 mmHg (01/02 0930) SpO2:  [90 %-96 %] 95 % (01/02 0930) Weight:  [199 lb 6 oz (90.436 kg)] 199 lb 6 oz (90.436 kg) (01/02 0547)  Last BM Date: 04/08/14 (PER PT, NO BM SINCE LAST TUESDAY, 04/08/2014)   24-hour weight change: Weight change: 1 lb 6 oz (0.624 kg)  Weight trends: Filed Weights   04/10/14 2340 04/11/14 0323 04/12/14 0547  Weight: 198 lb (89.812 kg) 200 lb 9.6 oz (90.992 kg) 199 lb 6 oz (90.436 kg)    Intake/Output:  01/01 0701 - 01/02 0700 In: [id number] [P.O.:1740] Out: [id number] [Urine:1675] Total I/O In: -  Out: 400 [Urine:400]   Physical Exam: BP 135/48 mmHg  Pulse 70  Temp(Src) 98.4 F (36.9 C) (Oral)  Resp 20  Ht 5\' 1"  (1.549 m)  Wt 199 lb 6 oz (90.436 kg)  BMI 37.69 kg/m2  SpO2 95%  Wt Readings from Last 3 Encounters:  04/12/14 199 lb 6 oz (90.436 kg)  02/11/14 200 lb 1.6 oz (90.765 kg)  01/13/14 210 lb 3.2 oz (95.346 kg)    General: Vital signs reviewed and noted.   Head: Normocephalic, atraumatic.  Eyes: conjunctivae/corneas clear.  EOM's intact.   Throat: normal  Neck:  thick   Lungs:    rales, especially in left side associated with wheezes   Heart:   RR   Abdomen:  Soft, non-tender, non-distended    Extremities: No edema    Neurologic: A&O X3, CN II - XII are grossly intact.   Psych: Normal     Labs: BMET:  Recent Labs  04/10/14 2350 04/11/14 0558  NA 139 141  K 3.2* 3.3*  CL 103 104  CO2 26 25  GLUCOSE 187* 131*  BUN 40* 39*  CREATININE 2.12* 2.05*  CALCIUM 9.7 9.3    Liver  function tests:  Recent Labs  04/11/14 0558  AST 26  ALT 18  ALKPHOS 86  BILITOT 0.5  PROT 7.3  ALBUMIN 3.2*   No results for input(s): LIPASE, AMYLASE in the last 72 hours.  CBC:  Recent Labs  04/10/14 2350 04/11/14 0558  WBC 8.9 7.0  HGB 9.3* 8.9*  HCT 29.0* 27.4*  MCV 87.9 90.7  PLT 279 278    Cardiac Enzymes:  Recent Labs  04/11/14 0558  TROPONINI 0.06*    Coagulation Studies:  Recent Labs  04/11/14 0558  LABPROT 14.8  INR 1.14    Other: Invalid input(s): POCBNP No results for input(s): DDIMER in the last 72 hours. No results for input(s): HGBA1C in the last 72 hours. No results for input(s): CHOL, HDL, LDLCALC, TRIG, CHOLHDL in the last 72 hours. No results for input(s): TSH, T4TOTAL, T3FREE, THYROIDAB in the last 72 hours.  Invalid input(s): FREET3 No results for input(s): VITAMINB12, FOLATE, FERRITIN, TIBC, IRON, RETICCTPCT in the last 72 hours.   Other results:  EKG :   NSR , PVCs   Medications:    Infusions:  Scheduled Medications: . allopurinol  100 mg Oral BID  . amLODipine  10 mg Oral q morning - 10a  . aspirin EC  81 mg Oral Daily  . carvedilol  6.25 mg Oral BID WC  . furosemide  20 mg Intravenous BID  . guaiFENesin  600 mg Oral BID  . heparin  5,000 Units Subcutaneous 3 times per day  . insulin aspart  0-15 Units Subcutaneous TID WC  . insulin aspart  0-5 Units Subcutaneous QHS  . latanoprost  1 drop Both Eyes QHS  . levofloxacin (LEVAQUIN) IV  500 mg Intravenous Q24H  . methylPREDNISolone (SOLU-MEDROL) injection  60 mg Intravenous Q6H  . simvastatin  20 mg Oral QPM  . sodium chloride  3 mL Intravenous Q12H    Assessment/ Plan:   Principal Problem:  Active Problems:   CKD (chronic kidney disease), stage III   Diabetes mellitus   Essential hypertension   Gout  1. Dyspnea:   She has normal LV function.  I suspect her dyspnea is a pulmonary issue.  She has lots of fine rales and wheezes - especially on the left  side.  She may need a CT of the lungs.  This has not been done due to her CKD ( Cr of 2. 05)   2.     Disposition:  Length of Stay: 2  Thayer Headings, Brooke Bonito., MD, [facility] 04/12/2014, 11:30 AM Office [phone] Pager [phone]

## 2014-04-12 NOTE — Progress Notes (Signed)
[facility] PROGRESS NOTE  Doris Howell [id number] DOB: February 01, 1936 DOA: 04/10/2014 PCP: Irven Shelling, MD  Assessment/Plan:  Acute on chronic combined systolic and diastolic heart failure Patient started on IV Lasix, cartilage consulted. Has mild elevation of troponin.  Slowly improving.  ? Acute bronchitis Patient has bilateral rhonchi, may have some element of bronchial inflammation. Chest x-ray done yesterday showed biapical airspace concerning for pneumonia, so we'll start the patient on IV Levaquin.  DuoNeb nebulizers every 4 hours when necessary Solu-Medrol 60 mg IV every 6 hours  Diabetes mellitus Continue sliding scale insulin.  C KD stage III Creatinine at baseline  DVT prophylaxis Heparin  Code Status: Full code Family Communication: *No family at bedside Disposition Plan: Home when stable   Consultants:  Cardiology  Procedures:  None  Antibiotics:  None  HPI/Subjective: 79 y.o. female with Past medical history of hypertension, dyslipidemia, diabetes mellitus, GERD, gout.The patient presented with complaints of shortness of breath that has been ongoing since last 4-5 days and progressively worsening.Patient mentions she has shortness of breath on rest as well as on exertion. She has h/o combined diastolic and systolic CHF. Started on IV lasix, cardiology consulted.  Objective: Filed Vitals:   04/12/14 0547  BP: 130/78  Pulse: 72  Temp: 98.6 F (37 C)  Resp: 20    Intake/Output Summary (Last 24 hours) at 04/12/14 0837 Last data filed at 04/12/14 0700  Gross per 24 hour  Intake   1740 ml  Output   1675 ml  Net     65 ml   Filed Weights   04/10/14 2340 04/11/14 0323 04/12/14 0547  Weight: 89.812 kg (198 lb) 90.992 kg (200 lb 9.6 oz) 90.436 kg (199 lb 6 oz)    Exam:  Physical Exam: Neck: Supple, no deformities, masses, or tenderness Lungs: Normal respiratory effort, bilateral rhonchi Heart: Regular rate and rhythm,  S1 and S2 normal, no murmurs, rubs auscultated Abdomen: BS normoactive,soft,nondistended,non-tender to palpation,no organomegaly Extremities: No pretibial edema, no erythema, no cyanosis, no clubbing Neuro : Alert and oriented to time, place and person, No focal deficits   Data Reviewed: Basic Metabolic Panel:  Recent Labs Lab 04/05/14 1900 04/10/14 2350 04/11/14 0558  NA 142 139 141  K 3.5 3.2* 3.3*  CL 106 103 104  CO2 27 26 25   GLUCOSE 132* 187* 131*  BUN 48* 40* 39*  CREATININE 2.24* 2.12* 2.05*  CALCIUM 9.6 9.7 9.3   Liver Function Tests:  Recent Labs Lab 04/05/14 1900 04/11/14 0558  AST 34 26  ALT 20 18  ALKPHOS 99 86  BILITOT 0.1* 0.5  PROT 7.8 7.3  ALBUMIN 3.9 3.2*   No results for input(s): LIPASE, AMYLASE in the last 168 hours. No results for input(s): AMMONIA in the last 168 hours. CBC:  Recent Labs Lab 04/05/14 1900 04/10/14 2350 04/11/14 0558  WBC 5.4 8.9 7.0  HGB 9.0* 9.3* 8.9*  HCT 28.7* 29.0* 27.4*  MCV 91.7 87.9 90.7  PLT 249 279 278   Cardiac Enzymes:  Recent Labs Lab 04/11/14 0558  TROPONINI 0.06*   BNP (last 3 results)  Recent Labs  01/09/14 1515 02/05/14 1216  PROBNP 2595.0* 2767.0*   CBG:  Recent Labs Lab 04/11/14 0649 04/11/14 1158 04/11/14 1629 04/11/14 2118 04/12/14 0747  GLUCAP 115* 144* 86 157* 71    No results found for this or any previous visit (from the past 240 hour(s)).   Studies: Dg Chest 2 View  04/11/2014   CLINICAL DATA:  Shortness  of breath  EXAM: CHEST  2 VIEW  COMPARISON:  02/08/2014  FINDINGS: Dense irregularly-shaped opacities in the upper lungs which are new. Trace pleural effusions. Chronic moderate cardiomegaly and aortic tortuosity.  IMPRESSION: Biapical airspace is primarily concerning for pneumonia, but a prior episode of CHF had a similar appearance. As well, there are small pleural effusions. Suggest follow-up after diuresis.   Electronically Signed   By: Jorje Guild M.D.   On:  04/11/2014 00:44    Scheduled Meds: . allopurinol  100 mg Oral BID  . amLODipine  10 mg Oral q morning - 10a  . aspirin EC  81 mg Oral Daily  . carvedilol  6.25 mg Oral BID WC  . furosemide  20 mg Intravenous Q12H  . guaiFENesin  600 mg Oral BID  . heparin  5,000 Units Subcutaneous 3 times per day  . insulin aspart  0-15 Units Subcutaneous TID WC  . insulin aspart  0-5 Units Subcutaneous QHS  . ipratropium-albuterol  3 mL Nebulization Q4H  . latanoprost  1 drop Both Eyes QHS  . levofloxacin (LEVAQUIN) IV  500 mg Intravenous Q24H  . methylPREDNISolone (SOLU-MEDROL) injection  60 mg Intravenous Q6H  . potassium chloride  40 mEq Oral Once  . simvastatin  20 mg Oral QPM  . sodium chloride  3 mL Intravenous Q12H   Continuous Infusions:   Principal Problem:   Acute on chronic combined systolic and diastolic heart failure Active Problems:   CKD (chronic kidney disease), stage III   Diabetes mellitus   Essential hypertension   Gout    Time spent: 25 min   [facility] Pager [phone]. If 7PM-7AM, please contact night-coverage at [email or web address], password [facility] 04/12/2014, 8:37 AM  LOS: 2 days

## 2014-04-13 ENCOUNTER — Inpatient Hospital Stay (HOSPITAL_COMMUNITY): Payer: Medicare Other

## 2014-04-13 DIAGNOSIS — R0609 Other forms of dyspnea: Secondary | ICD-10-CM

## 2014-04-13 DIAGNOSIS — E785 Hyperlipidemia, unspecified: Secondary | ICD-10-CM

## 2014-04-13 DIAGNOSIS — E1121 Type 2 diabetes mellitus with diabetic nephropathy: Secondary | ICD-10-CM

## 2014-04-13 DIAGNOSIS — N183 Chronic kidney disease, stage 3 (moderate): Secondary | ICD-10-CM

## 2014-04-13 DIAGNOSIS — I5031 Acute diastolic (congestive) heart failure: Secondary | ICD-10-CM

## 2014-04-13 DIAGNOSIS — R7989 Other specified abnormal findings of blood chemistry: Secondary | ICD-10-CM

## 2014-04-13 DIAGNOSIS — D508 Other iron deficiency anemias: Secondary | ICD-10-CM

## 2014-04-13 DIAGNOSIS — I10 Essential (primary) hypertension: Secondary | ICD-10-CM

## 2014-04-13 DIAGNOSIS — G4733 Obstructive sleep apnea (adult) (pediatric): Secondary | ICD-10-CM

## 2014-04-13 DIAGNOSIS — R062 Wheezing: Secondary | ICD-10-CM

## 2014-04-13 DIAGNOSIS — R0902 Hypoxemia: Secondary | ICD-10-CM

## 2014-04-13 DIAGNOSIS — J9601 Acute respiratory failure with hypoxia: Secondary | ICD-10-CM

## 2014-04-13 DIAGNOSIS — J189 Pneumonia, unspecified organism: Secondary | ICD-10-CM

## 2014-04-13 LAB — GLUCOSE, CAPILLARY
Glucose-Capillary: 175 mg/dL — ABNORMAL HIGH (ref 70–99)
Glucose-Capillary: 222 mg/dL — ABNORMAL HIGH (ref 70–99)
Glucose-Capillary: 169 mg/dL — ABNORMAL HIGH (ref 70–99)
Glucose-Capillary: 241 mg/dL — ABNORMAL HIGH (ref 70–99)

## 2014-04-13 LAB — BASIC METABOLIC PANEL
Anion gap: 8 (ref 5–15)
BUN: 51 mg/dL — ABNORMAL HIGH (ref 6–23)
CO2: 27 mmol/L (ref 19–32)
Calcium: 9.3 mg/dL (ref 8.4–10.5)
Chloride: 103 mEq/L (ref 96–112)
Creatinine, Ser: 2.28 mg/dL — ABNORMAL HIGH (ref 0.50–1.10)
GFR calc Af Amer: 23 mL/min — ABNORMAL LOW (ref >90)
GFR calc non Af Amer: 20 mL/min — ABNORMAL LOW (ref >90)
Glucose, Bld: 219 mg/dL — ABNORMAL HIGH (ref 70–99)
Potassium: 4.1 mmol/L (ref 3.5–5.1)
Sodium: 138 mmol/L (ref 135–145)

## 2014-04-13 LAB — LEGIONELLA ANTIGEN, URINE

## 2014-04-13 MED ORDER — METHYLPREDNISOLONE SODIUM SUCC 40 MG IJ SOLR
40.0000 mg | Freq: Two times a day (BID) | INTRAMUSCULAR | Status: DC
  Administered 2014-04-13 – 2014-04-14 (×2): 40 mg via INTRAVENOUS
  Filled 2014-04-13 (×6): qty 1

## 2014-04-13 NOTE — Consult Note (Signed)
Dictation #:  [id number]

## 2014-04-13 NOTE — Consult Note (Signed)
NAME:  Doris Howell, Doris Howell NO.:  [id number]  MEDICAL RECORD NO.:  [id number]  LOCATION:  3E22C                        FACILITY:  [facility]  PHYSICIAN:  Kathee Delton, MD,FCCPDATE OF BIRTH:  June 06, 1935  DATE OF CONSULTATION:  04/13/2014 DATE OF DISCHARGE:                                CONSULTATION   REFERRING PHYSICIAN:  [facility].  HISTORY OF PRESENT ILLNESS:  The patient is a 79 year old female who I have been asked to see for acute respiratory failure and bilateral pulmonary infiltrates.  She has had approximately 3 episodes of sudden onset of shortness of breath that worsened over 24-48 hours period and resulted in hospitalization.  The last of these occurred in October of this year where she had a chest x-ray very similar to her current presentation, and totally resolved with diuretics.  She was started on antibiotics three days later, presumably for pneumonia, but her x-ray had already cleared completely by that time.  She comes in today where she had the sudden onset of shortness of breath while walking and this worsened over one to two day period.  She was then found to be hypoxemic and found to have bilateral infiltrates, more upper lobe than lower lobe that was very similar to her admission in October.  The patient has been diuresed aggressively since being in the hospital, and has had a bump in her BUN and creatinine.  She feels that she is better, but has not had a followup chest x-ray since admission. She has had an echocardiogram in October, which showed a normal ejection fraction, normal RV, mild left atrial enlargement, and borderline increase in pulmonary artery pressures.  She carries the diagnosis of chronic diastolic heart failure.  She currently denies any pulmonary symptoms such as cough, congestion, or mucus production.  She has had no fevers, chills, or sweats.  She is a total nonsmoker in the past.  PAST MEDICAL HISTORY: 1.  Significant for hypertension. 2. Dyslipidemia. 3. Nephrolithiasis. 4. Diabetes. 5. Reflux disease. 6. History of gout. 7. History of diastolic heart failure.  ALLERGIES:  SHE HAS AN ALLERGY TO CODEINE.  SOCIAL HISTORY:  She has never smoked and does not drink alcohol.  Used illicit drugs.  FAMILY HISTORY:  Totally noncontributory.  REVIEW OF SYSTEMS:  Ten-point review of systems is unremarkable except for that listed in history of present illness.  PHYSICAL EXAMINATION:  GENERAL:  She is a morbidly obese female in no acute distress. VITAL SIGNS:  Blood pressure is 138/68, pulse is 76, respiratory rate is 18-20.  She is afebrile.  O2 saturation on 3 L is 100%. HEENT:  Pupils equal, round, reactive to light and accommodation. Extraocular muscles are intact.  Nares are patent without discharge. Oropharynx is clear. NECK:  Supple without thyromegaly or lymphadenopathy. CHEST:  Basilar crackles, no true wheezing noted.  She does have prominent upper airway pseudo wheezing that totally resolves with pursed lip breathing. CARDIAC:  Regular rate and rhythm. ABDOMEN:  Soft, nontender, nondistended with good bowel sounds. GENITAL, RECTAL, AND BREASTS:  Exam was not done and not indicated. EXTREMITIES:  Lower extremities showed mild edema, there is no cyanosis. NEUROLOGIC:  She is alert and oriented, and moves all 4  extremities.  IMPRESSION:  Acute respiratory failure secondary to pulmonary infiltrates.  It is likely secondary to pulmonary edema.  The patient has no new symptoms that are suggestive of pneumonia, and her chest x- ray this admission is identical to that from October, which resolved with aggressive diuresis.  She carries the diagnosis of chronic diastolic heart failure, but her echocardiogram from October was really unimpressive.  Since she has had 3 episodes that are very similar to this, I wonder if she is having intermittent ischemia that may need more aggressive  workup.  I would recommend that she have a chest x-ray today to follow up her infiltrates and she has been aggressively diuresed.  Of note, she does not have true wheezing on my exam, but very prominent upper airway pseudo wheezing which resolves with pursed lip breathing. If her followup chest x-ray is clear, then it is very likely responded to our aggressive diuresis.  If she continues to have significant infiltrate, would want to consider whether she may be having possible aspiration.  She has no difficulties with swallowing or coughing with p.o. intake, but she does admit she has significant reflux disease.  SUGGESTIONS: 1. Recheck chest x-ray today. 2. Continue weaning supplemental oxygen to see if she can get to room     air. 3. We will follow up with you in the morning to see if she needs     further pulmonary input.     Kathee Delton, MD,FCCP     [facility]/MEDQ  D:  04/13/2014  T:  04/13/2014  Job:  [id number]

## 2014-04-13 NOTE — Progress Notes (Addendum)
PROGRESS NOTE    Doris Howell [id number] DOB: [birth year] DOA: 04/10/2014 PCP: Irven Shelling, MD  HPI/Brief narrative 79 year old female patient with history of HTN, HLD, DM 2, GERD, gout, CKD, anemia, OSA, presented with acute/subacute onset of dyspnea and hypoxia. Lifelong nonsmoker but exposed to secondhand smoke exposure spouse. This is her third admission in the last 3 months for similar presentation. Assessed to have acute on chronic diastolic CHF. Improved with diuretics. Cardiology felt that her presentation was more in keeping with a pulmonary etiology. Pulmonary consulted on 04/13/14 and felt all her presentation was primarily cardiac including previous admission.   Assessment/Plan:  1. Acute respiratory failure with hypoxia: Likely secondary to acute diastolic CHF. She was diuresed with IV Lasix and has clinically improved. Discussed with Dr. Gwenette Greet who evaluated patient today and indicates that her presentation is more in keeping with CHF versus any pulmonary etiology. He also recommends that if chest x-ray negative, may consider discontinuing antibiotics. Updated cardiology. Follow-up repeat chest x-ray. VQ scan during recent admission was negative. 2. Acute on chronic diastolic CHF: Continue diuretics. Requested cardiology to continue to follow and? Need for ischemic evaluation (i.e Myoview. May not be a candidate for heart cath which would probably push her into hemodialysis). Improved. Received IV Lasix this morning- DC'ed by Cards. Will start PO Lasix 04/14/14. 3. Bradycardia: Asymptomatic. Coreg was reduced to 6.25 MG twice a day. Improved. 4. Mildly elevated troponin: Likely secondary to demand ischemia from acute respiratory failure and acute diastolic CHF. Cardiology to evaluate as deemed appropriate. 5. Essential hypertension: Controlled. Continue amlodipine and carvedilol. 6. Hyperlipidemia: Continue simvastatin 7. Uncontrolled DM 2: Continue NovoLog SSI.  Worsened by steroids-we will reduce steroids. Oral hypoglycemics on hold. 8. Stage III chronic kidney disease: Baseline creatinine probably in the range of 1.9-2.2. Creatinine close to baseline. Follow BMP. Will need nephrology consultation and follow-up outpatient. 9. Chronic anemia: Likely secondary to chronic kidney disease. Stable. 10. Hypokalemia: Replaced 11. Suspected OHS/OSA: Outpatient workup with sleep study   Code Status: Full Family Communication: None at bedside Disposition Plan: Not medically stable for discharge   Consultants:  Cardiology  Pulmonology  Procedures:  None  Antibiotics:  IV levofloxacin   Subjective: Dyspnea has improved. Still dyspneic with minimal exertion. No chest pain or cough.  Objective: Filed Vitals:   04/12/14 1430 04/12/14 2000 04/13/14 0500 04/13/14 0916  BP: 134/61 139/64 139/65 138/68  Pulse: 63 74 83 76  Temp: 97.3 F (36.3 C) 98.4 F (36.9 C) 98.4 F (36.9 C)   TempSrc: Oral     Resp: 20 18 21 20   Height:      Weight:   90.719 kg (200 lb)   SpO2: 100% 99% 97% 100%    Intake/Output Summary (Last 24 hours) at 04/13/14 1213 Last data filed at 04/13/14 0833  Gross per 24 hour  Intake   1126 ml  Output   1700 ml  Net   -574 ml   Filed Weights   04/11/14 0323 04/12/14 0547 04/13/14 0500  Weight: 90.992 kg (200 lb 9.6 oz) 90.436 kg (199 lb 6 oz) 90.719 kg (200 lb)     Exam:  General exam: Pleasant elderly female sitting comfortably propped up in bed. Respiratory system: Diminished breath sounds in the bases with occasional bibasilar fine crackles. Rest of lung fields clear to auscultation. No increased work of breathing. No wheezing or rhonchi. Cardiovascular system: S1 & S2 heard, RRR. No JVD, murmurs, gallops, clicks or pedal edema. Telemetry: Sinus rhythm  with occasional PVCs. Gastrointestinal system: Abdomen is nondistended, soft and nontender. Normal bowel sounds heard. Central nervous system: Alert and  oriented. No focal neurological deficits. Extremities: Symmetric 5 x 5 power.   Data Reviewed: Basic Metabolic Panel:  Recent Labs Lab 04/10/14 2350 04/11/14 0558 04/13/14 0400  NA 139 141 138  K 3.2* 3.3* 4.1  CL 103 104 103  CO2 26 25 27   GLUCOSE 187* 131* 219*  BUN 40* 39* 51*  CREATININE 2.12* 2.05* 2.28*  CALCIUM 9.7 9.3 9.3   Liver Function Tests:  Recent Labs Lab 04/11/14 0558  AST 26  ALT 18  ALKPHOS 86  BILITOT 0.5  PROT 7.3  ALBUMIN 3.2*   No results for input(s): LIPASE, AMYLASE in the last 168 hours. No results for input(s): AMMONIA in the last 168 hours. CBC:  Recent Labs Lab 04/10/14 2350 04/11/14 0558  WBC 8.9 7.0  HGB 9.3* 8.9*  HCT 29.0* 27.4*  MCV 87.9 90.7  PLT 279 278   Cardiac Enzymes:  Recent Labs Lab 04/11/14 0558  TROPONINI 0.06*   BNP (last 3 results)  Recent Labs  01/09/14 1515 02/05/14 1216  PROBNP 2595.0* 2767.0*   CBG:  Recent Labs Lab 04/12/14 1127 04/12/14 1637 04/12/14 2113 04/13/14 0646 04/13/14 1155  GLUCAP 175* 153* 278* 175* 222*    No results found for this or any previous visit (from the past 240 hour(s)).       Studies: No results found.      Scheduled Meds: . allopurinol  100 mg Oral BID  . amLODipine  10 mg Oral q morning - 10a  . aspirin EC  81 mg Oral Daily  . carvedilol  6.25 mg Oral BID WC  . guaiFENesin  600 mg Oral BID  . heparin  5,000 Units Subcutaneous 3 times per day  . insulin aspart  0-15 Units Subcutaneous TID WC  . insulin aspart  0-5 Units Subcutaneous QHS  . latanoprost  1 drop Both Eyes QHS  . levofloxacin (LEVAQUIN) IV  250 mg Intravenous Q24H  . methylPREDNISolone (SOLU-MEDROL) injection  60 mg Intravenous Q6H  . simvastatin  20 mg Oral QPM  . sodium chloride  3 mL Intravenous Q12H   Continuous Infusions:   Principal Problem:   Diastolic CHF, acute on chronic Active Problems:   CKD (chronic kidney disease), stage III   Diabetes mellitus   Essential  hypertension   Gout   SOB (shortness of breath)    Time spent: 40 minutes    Arland Usery, MD, FACP, FHM. [facility] Pager [phone]  If 7PM-7AM, please contact night-coverage [email or web address] Password [email or web address] 04/13/2014, 12:13 PM    LOS: 3 days

## 2014-04-13 NOTE — Progress Notes (Addendum)
SUBJECTIVE: Pt says breathing has improved since admission. Denies chest pain.     Intake/Output Summary (Last 24 hours) at 04/13/14 0946 Last data filed at 04/13/14 [id number]  Gross per 24 hour  Intake   1226 ml  Output   2100 ml  Net   -874 ml    Current Facility-Administered Medications  Medication Dose Route Frequency Provider Last Rate Last Dose  . acetaminophen (TYLENOL) tablet 650 mg  650 mg Oral Q6H PRN Berle Mull, MD       Or  . acetaminophen (TYLENOL) suppository 650 mg  650 mg Rectal Q6H PRN Berle Mull, MD      . albuterol (PROVENTIL) (2.5 MG/3ML) 0.083% nebulizer solution 3 mL  3 mL Inhalation Q6H PRN Berle Mull, MD      . allopurinol (ZYLOPRIM) tablet 100 mg  100 mg Oral BID Berle Mull, MD   100 mg at 04/12/14 2259  . amLODipine (NORVASC) tablet 10 mg  10 mg Oral q morning - 10a Berle Mull, MD   10 mg at 04/12/14 1047  . aspirin EC tablet 81 mg  81 mg Oral Daily Berle Mull, MD   81 mg at 04/12/14 1047  . bisacodyl (DULCOLAX) suppository 10 mg  10 mg Rectal Daily PRN Oswald Hillock, MD      . carvedilol (COREG) tablet 6.25 mg  6.25 mg Oral BID WC Almyra Deforest, PA   6.25 mg at 04/13/14 0653  . furosemide (LASIX) injection 20 mg  20 mg Intravenous BID Oswald Hillock, MD   20 mg at 04/13/14 0835  . guaiFENesin (MUCINEX) 12 hr tablet 600 mg  600 mg Oral BID Berle Mull, MD   600 mg at 04/12/14 2259  . heparin injection 5,000 Units  5,000 Units Subcutaneous 3 times per day Berle Mull, MD   5,000 Units at 04/13/14 [phone]  . insulin aspart (novoLOG) injection 0-15 Units  0-15 Units Subcutaneous TID WC Berle Mull, MD   3 Units at 04/13/14 [phone]  . insulin aspart (novoLOG) injection 0-5 Units  0-5 Units Subcutaneous QHS Berle Mull, MD   3 Units at 04/12/14 2300  . latanoprost (XALATAN) 0.005 % ophthalmic solution 1 drop  1 drop Both Eyes QHS Oswald Hillock, MD   1 drop at 04/12/14 2259  . Levofloxacin (LEVAQUIN) IVPB 250 mg  250 mg Intravenous Q24H Oswald Hillock, MD   250  mg at 04/13/14 0654  . magnesium hydroxide (MILK OF MAGNESIA) suspension 15 mL  15 mL Oral Daily PRN Oswald Hillock, MD   15 mL at 04/12/14 0842  . methylPREDNISolone sodium succinate (SOLU-MEDROL) 125 mg/2 mL injection 60 mg  60 mg Intravenous Q6H Oswald Hillock, MD   60 mg at 04/13/14 0835  . nitroGLYCERIN (NITROSTAT) SL tablet 0.4 mg  0.4 mg Sublingual Q5 min PRN [name], MD      . ondansetron [facility]) tablet 4 mg  4 mg Oral Q6H PRN Berle Mull, MD       Or  . ondansetron (ZOFRAN) injection 4 mg  4 mg Intravenous Q6H PRN Berle Mull, MD      . ondansetron (ZOFRAN-ODT) disintegrating tablet 4 mg  4 mg Oral Q8H PRN Berle Mull, MD      . simvastatin (ZOCOR) tablet 20 mg  20 mg Oral QPM Berle Mull, MD   20 mg at 04/12/14 1758  . sodium chloride 0.9 % injection 3 mL  3 mL Intravenous Q12H  Berle Mull, MD   3 mL at 04/12/14 2259  . traMADol (ULTRAM) tablet 50 mg  50 mg Oral Q6H PRN Berle Mull, MD   50 mg at 04/11/14 1210    Filed Vitals:   04/12/14 1430 04/12/14 2000 04/13/14 0500 04/13/14 0916  BP: 134/61 139/64 139/65 138/68  Pulse: 63 74 83 76  Temp: 97.3 F (36.3 C) 98.4 F (36.9 C) 98.4 F (36.9 C)   TempSrc: Oral     Resp: 20 18 21 20   Height:      Weight:   200 lb (90.719 kg)   SpO2: 100% 99% 97% 100%    PHYSICAL EXAM General: NAD, obese HEENT: Normal. Neck: No JVD, no thyromegaly.  Lungs: Prolonged expiratory phase with expiratory wheezes, no rales. CV: Nondisplaced PMI.  Regular rate and rhythm, normal S1/S2, no S3/S4, no murmur.  No pretibial edema.  Abdomen: Soft, nontender, obese, no distention.  Neurologic: Alert and oriented x 3.  Psych: Normal affect. Musculoskeletal: No gross deformities. Extremities: No clubbing or cyanosis.   TELEMETRY: Reviewed telemetry pt in sinus rhythm.  LABS: Basic Metabolic Panel:  Recent Labs  04/11/14 0558 04/13/14 0400  NA 141 138  K 3.3* 4.1  CL 104 103  CO2 25 27  GLUCOSE 131* 219*  BUN 39* 51*  CREATININE  2.05* 2.28*  CALCIUM 9.3 9.3   Liver Function Tests:  Recent Labs  04/11/14 0558  AST 26  ALT 18  ALKPHOS 86  BILITOT 0.5  PROT 7.3  ALBUMIN 3.2*   No results for input(s): LIPASE, AMYLASE in the last 72 hours. CBC:  Recent Labs  04/10/14 2350 04/11/14 0558  WBC 8.9 7.0  HGB 9.3* 8.9*  HCT 29.0* 27.4*  MCV 87.9 90.7  PLT 279 278   Cardiac Enzymes:  Recent Labs  04/11/14 0558  TROPONINI 0.06*   BNP: Invalid input(s): POCBNP D-Dimer: No results for input(s): DDIMER in the last 72 hours. Hemoglobin A1C: No results for input(s): HGBA1C in the last 72 hours. Fasting Lipid Panel: No results for input(s): CHOL, HDL, LDLCALC, TRIG, CHOLHDL, LDLDIRECT in the last 72 hours. Thyroid Function Tests: No results for input(s): TSH, T4TOTAL, T3FREE, THYROIDAB in the last 72 hours.  Invalid input(s): FREET3 Anemia Panel: No results for input(s): VITAMINB12, FOLATE, FERRITIN, TIBC, IRON, RETICCTPCT in the last 72 hours.  RADIOLOGY: Ct Abdomen Pelvis Wo Contrast  04/05/2014   CLINICAL DATA:  Right-sided flank pain with history of stones.  EXAM: CT ABDOMEN AND PELVIS WITHOUT CONTRAST  TECHNIQUE: Multidetector CT imaging of the abdomen and pelvis was performed following the standard protocol without IV contrast.  COMPARISON:  MRI abdomen 03/26/2014  FINDINGS: Lower chest:  Mild basilar atelectasis.  The heart is enlarged.  Hepatobiliary: No focal hepatic lesions non contrast exam. Gallbladder is normal.  Pancreas: Pancreas is normal. No ductal dilatation. No pancreatic inflammation.  Spleen: Normal spleen.  Adrenals/urinary tract: Adrenal glands are normal. Post left nephrectomy. No nodularity in nephrectomy bed. Right kidney is normal. The right ureter and bladder are normal.  Stomach/Bowel: The stomach, small bowel, and cecum are normal. Colon and rectosigmoid colon are normal.  Vascular/Lymphatic: Abdominal aorta is normal caliber. There is no retroperitoneal or periportal  lymphadenopathy. No pelvic lymphadenopathy.  Reproductive: Post hysterectomy anatomy. No free fluid the pelvis. No pelvic lymphadenopathy.  Musculoskeletal: No aggressive osseous lesion.  Other: No free fluid.  Inflammation.  IMPRESSION: 1. Normal right kidney on noncontrast exam. No ureterolithiasis or nephrolithiasis. Bladder appears normal. 2. Left nephrectomy  without complication. 3. Post hysterectomy   Electronically Signed   By: Suzy Bouchard M.D.   On: 04/05/2014 22:06   Dg Chest 2 View  04/11/2014   CLINICAL DATA:  Shortness of breath  EXAM: CHEST  2 VIEW  COMPARISON:  02/08/2014  FINDINGS: Dense irregularly-shaped opacities in the upper lungs which are new. Trace pleural effusions. Chronic moderate cardiomegaly and aortic tortuosity.  IMPRESSION: Biapical airspace is primarily concerning for pneumonia, but a prior episode of CHF had a similar appearance. As well, there are small pleural effusions. Suggest follow-up after diuresis.   Electronically Signed   By: Jorje Guild M.D.   On: 04/11/2014 00:44   Mr Abdomen Wo Contrast  03/26/2014   CLINICAL DATA:  Evaluate pancreas cyst  EXAM: MRI ABDOMEN WITHOUT CONTRAST  TECHNIQUE: Multiplanar multisequence MR imaging was performed without the administration of intravenous contrast.  COMPARISON:  10/29/2013  FINDINGS: Lower chest: There is no pleural or pericardial effusion identified. The heart size appears moderately enlarged.  Hepatobiliary: On the diffusion weighted images the previously described hypervascular liver lesions within the left hepatic lobe are identified and appears stable measuring 1 cm, image number 18/series 8. T2 hyperintense structure in the left hepatic lobe is unchanged , image number 9/series 7.  Pancreas: Numerous unilocular, T2 hyperintense structures are identified within the pancreas. The largest is in the body of pancreas measuring 1.4 cm, image 17/series 6. This is unchanged from previous exam the index lesion within the  tail of pancreas is also stable measuring 1.1 cm, image 11/series 6. Several T2 hyperintense structures are noted within the uncinate process. The largest measures 1.1 cm, image 13/series 6. These are unchanged from previous exam.  Spleen: The spleen is normal.  Adrenals/Urinary Tract: The adrenal glands are both normal. Status post left nephrectomy. Normal appearance of the right kidney.  Stomach/Bowel: The visualized upper abdominal bowel loops and stomach appear within normal limits.  Vascular/Lymphatic: Normal caliber of the abdominal aorta. No adenopathy identified within the upper abdomen.  Other: No free fluid or fluid collections within the upper abdomen.  Musculoskeletal: No abnormal areas of signal from within the bone marrow.  IMPRESSION: 1. Overall stable exam compared with 10/29/2013. 2. Although performed to without IV contrast material the previously described hypervascular liver lesions (identified on diffusion weighted sequences) are unchanged from previous exam favoring a benign abnormality. 3. Cysts within the liver and pancreas are stable from previous exam. A single followup examination at 12 months (10/29/2013) is recommended to confirm stability.   Electronically Signed   By: Kerby Moors M.D.   On: 03/26/2014 12:09      ASSESSMENT AND PLAN: 1. Exertional dyspnea with hypoxia both at rest and with exertion: I think her issues are more pulmonary in etiology, and would likely benefit from outpatient PFT's. Marked wheezing on exam, with h/o secondhand smoke exposure. Also has obesity-hypoventilation syndrome and is being scheduled for outpatient sleep study as per pt. Given rising BUN, this would indicate she is actually euvolemic and does not require IV diuretics, which I will stop. Also found to have pneumonia on CXR. On antibiotics and steroids. 2. Bradycardia: Resolved with reduction of Coreg to 6.25mg . 3. Mildly elevated trop: likely related to hypoxia and demand  ischemia, no obvious EKG changes. Consider outpatient Lexiscan Cardiolite. 4. Hypertension: Stable on amlodipine and Coreg. No changes. 5. Hyperlipidemia: On statin therapy. 6. DM: On insulin. Needs weight reduction. 7. GERD 8. Chronic kidney disease: Rising BUN indicative of euvolemic state. Will  d/c IV diuretics. Can give prn oral Lasix as per outpatient regimen. 9. Anemia: Hgb 8.9 on 1/1, likely due to diminished EPO production due to CKD. 10. OSA: Outpatient sleep study.  Dispo: No further recommendations. Will sign off.  Kate Sable, M.D., F.A.C.C.  ADDENDUM: Called by Dr. Algis Liming and asked to continue to follow with consideration at some point for ischemic workup. He had spoken with pulmonary who felt her symptoms were not pulmonary in etiology, and likely due to heart failure. We will continue to follow along with you.

## 2014-04-14 ENCOUNTER — Encounter (HOSPITAL_COMMUNITY): Payer: Self-pay | Admitting: Cardiology

## 2014-04-14 DIAGNOSIS — R001 Bradycardia, unspecified: Secondary | ICD-10-CM | POA: Diagnosis present

## 2014-04-14 DIAGNOSIS — E669 Obesity, unspecified: Secondary | ICD-10-CM | POA: Diagnosis present

## 2014-04-14 DIAGNOSIS — R918 Other nonspecific abnormal finding of lung field: Secondary | ICD-10-CM

## 2014-04-14 DIAGNOSIS — I5033 Acute on chronic diastolic (congestive) heart failure: Secondary | ICD-10-CM

## 2014-04-14 LAB — URINALYSIS, ROUTINE W REFLEX MICROSCOPIC
Bilirubin Urine: NEGATIVE
Glucose, UA: NEGATIVE mg/dL
Hgb urine dipstick: NEGATIVE
Ketones, ur: NEGATIVE mg/dL
Leukocytes, UA: NEGATIVE
Nitrite: NEGATIVE
Protein, ur: 30 mg/dL — AB
Specific Gravity, Urine: 1.014 (ref 1.005–1.030)
Urobilinogen, UA: 0.2 mg/dL (ref 0.0–1.0)
pH: 5 (ref 5.0–8.0)

## 2014-04-14 LAB — GLUCOSE, CAPILLARY
Glucose-Capillary: 208 mg/dL — ABNORMAL HIGH (ref 70–99)
Glucose-Capillary: 247 mg/dL — ABNORMAL HIGH (ref 70–99)
Glucose-Capillary: 277 mg/dL — ABNORMAL HIGH (ref 70–99)
Glucose-Capillary: 214 mg/dL — ABNORMAL HIGH (ref 70–99)

## 2014-04-14 LAB — URINE MICROSCOPIC-ADD ON

## 2014-04-14 LAB — BASIC METABOLIC PANEL
Anion gap: 9 (ref 5–15)
BUN: 61 mg/dL — ABNORMAL HIGH (ref 6–23)
CO2: 26 mmol/L (ref 19–32)
Calcium: 9.1 mg/dL (ref 8.4–10.5)
Chloride: 106 mEq/L (ref 96–112)
Creatinine, Ser: 2.23 mg/dL — ABNORMAL HIGH (ref 0.50–1.10)
GFR calc Af Amer: 23 mL/min — ABNORMAL LOW (ref >90)
GFR calc non Af Amer: 20 mL/min — ABNORMAL LOW (ref >90)
Glucose, Bld: 218 mg/dL — ABNORMAL HIGH (ref 70–99)
Potassium: 4 mmol/L (ref 3.5–5.1)
Sodium: 141 mmol/L (ref 135–145)

## 2014-04-14 LAB — PROTEIN / CREATININE RATIO, URINE
Creatinine, Urine: 62.7 mg/dL
Protein Creatinine Ratio: 0.57 — ABNORMAL HIGH (ref 0.00–0.15)
Total Protein, Urine: 36 mg/dL

## 2014-04-14 LAB — CBC
HCT: 26.3 % — ABNORMAL LOW (ref 36.0–46.0)
Hemoglobin: 8.3 g/dL — ABNORMAL LOW (ref 12.0–15.0)
MCH: 28.2 pg (ref 26.0–34.0)
MCHC: 31.6 g/dL (ref 30.0–36.0)
MCV: 89.5 fL (ref 78.0–100.0)
Platelets: 271 10*3/uL (ref 150–400)
RBC: 2.94 MIL/uL — ABNORMAL LOW (ref 3.87–5.11)
RDW: 15 % (ref 11.5–15.5)
WBC: 10.4 10*3/uL (ref 4.0–10.5)

## 2014-04-14 MED ORDER — SODIUM CHLORIDE 0.9 % IJ SOLN: 3.0000 mL | INTRAMUSCULAR | Status: DC | PRN

## 2014-04-14 MED ORDER — ASPIRIN 81 MG PO CHEW
81.0000 mg | CHEWABLE_TABLET | ORAL | Status: AC
  Administered 2014-04-15: 81 mg via ORAL
  Filled 2014-04-14: qty 1

## 2014-04-14 MED ORDER — PREDNISONE 20 MG PO TABS
40.0000 mg | ORAL_TABLET | Freq: Every day | ORAL | Status: DC
  Administered 2014-04-15 – 2014-04-16 (×2): 40 mg via ORAL
  Filled 2014-04-14 (×3): qty 2

## 2014-04-14 MED ORDER — FUROSEMIDE 10 MG/ML IJ SOLN
40.0000 mg | Freq: Two times a day (BID) | INTRAMUSCULAR | Status: DC
  Administered 2014-04-14: 40 mg via INTRAVENOUS
  Filled 2014-04-14 (×3): qty 4

## 2014-04-14 MED ORDER — SODIUM CHLORIDE 0.9 % IV SOLN: 250.0000 mL | INTRAVENOUS | Status: DC | PRN

## 2014-04-14 MED ORDER — SODIUM CHLORIDE 0.9 % IJ SOLN
3.0000 mL | Freq: Two times a day (BID) | INTRAMUSCULAR | Status: DC
  Administered 2014-04-15: 3 mL via INTRAVENOUS

## 2014-04-14 MED ORDER — SODIUM CHLORIDE 0.9 % IV SOLN
INTRAVENOUS | Status: DC
  Administered 2014-04-15: 20 mL/h via INTRAVENOUS

## 2014-04-14 NOTE — Progress Notes (Signed)
UR completed Kaydince Towles K. Tapanga Ottaway, RN, BSN, Walker, CCM  04/14/2014 2:17 PM

## 2014-04-14 NOTE — Progress Notes (Signed)
Subjective:  Still SOB- in bed on O2.  Objective:  Vital Signs in the last 24 hours: Temp:  [97.7 F (36.5 C)-98.4 F (36.9 C)] 98.4 F (36.9 C) (01/04 0445) Pulse Rate:  [76-79] 79 (01/04 0445) Resp:  [20] 20 (01/04 0445) BP: (131-147)/(53-98) 131/53 mmHg (01/04 0445) SpO2:  [97 %-100 %] 97 % (01/04 0445) Weight:  [215 lb 9.8 oz (97.8 kg)] 215 lb 9.8 oz (97.8 kg) (01/04 0445)  Intake/Output from previous day:  Intake/Output Summary (Last 24 hours) at 04/14/14 0904 Last data filed at 04/14/14 0450  Gross per 24 hour  Intake    840 ml  Output   1700 ml  Net   -860 ml    Physical Exam: General appearance: alert, cooperative, no distress and moderately obese HEENT-normal Lungs: decreased breath sounds, mild wheezing Heart: regular rate and rhythm Abd: soft Ext: trace edema Neuro: grossly intact   Rate: 80  Rhythm: normal sinus rhythm and PVCs  Lab Results:  Recent Labs  04/14/14 0405  WBC 10.4  HGB 8.3*  PLT 271    Recent Labs  04/13/14 0400 04/14/14 0405  NA 138 141  K 4.1 4.0  CL 103 106  CO2 27 26  GLUCOSE 219* 218*  BUN 51* 61*  CREATININE 2.28* 2.23*    Imaging: Imaging results have been reviewed  Cardiac Studies:Echo 10/15 Study Conclusions  - Left ventricle: The cavity size was normal. Wall thickness was increased in a pattern of mild LVH. Systolic function was normal. The estimated ejection fraction was in the range of 50% to 55%. - Left atrium: The atrium was mildly dilated. - Pulmonary arteries: PA peak pressure: 32 mm Hg (S   Assessment/Plan:  79 year old female with history of hypertension, hyperlipidemia, diabetes, GERD, chronic kidney disease, and anemia, admitted 04/10/14 with dyspnea. Third admission in last few months for the same. Echo in Oct showed preserved LVF. She has presumed diastolic CHF not previously seen by cardiology. She has also been followed by pulmonary for CAP. Previously with diuresis her CXR improved.      Principal Problem:   Diastolic CHF, acute on chronic Active Problems:   SOB (shortness of breath)   CKD (chronic kidney disease), stage III   HCAP (healthcare-associated pneumonia)   Diabetes mellitus   Essential hypertension   Gout   Obesity (BMI 37)   Bradycardia- beta blocker decreased   PLAN: Will review with MD- diuretics stopped yesterday.   Kerin Ransom PA-C Beeper [id number] 04/14/2014, 9:04 AM 1 dyspnea-I spoke with patient and history seems most consistent with congestive heart failure as she has dyspnea on exertion and orthopnea. She denies fevers, chills or productive cough. With diuresis her renal function has deteriorated. Will resume Lasix 40 mg twice a day and follow renal function. At present she cannot lay flat. I will reassess tomorrow morning and if she is able we will plan to proceed with right heart catheterization to better understand filling pressures. Pulmonary is following and also feels this is more likely to be CHF. 2 chronic stage IV renal failure-given severity of renal failure I have asked nephrology to evaluate to help with understanding diuretic dose and she will need to be followed as an outpatient. 3 hypertension-Blood pressure is controlled. Continue present medications. 4 mildly elevated troponin-this is not consistent with acute coronary syndrome. History is most likely consistent with congestive heart failure. Would ordinarily proceed with left heart catheterization as well. However she is at significant risk for contrast nephropathy.  We will plan a nuclear study as an outpatient after she improves. Kirk Ruths

## 2014-04-14 NOTE — Consult Note (Signed)
Reason for Consult: Evaluation of chronic kidney disease stage IV Referring Physician: Kirk Ruths MD (cardiology)  HPI:  79 year old African-American wound past medical history significant for hypertension for the past 40 years, history of left-sided nephrectomy back in May/2012 for staghorn calculus/atrophic kidney and what appears to be chronic kidney disease stage IV with a solitary kidney. She also has type 2 diabetes mellitus but denies history of retinopathy-has had prior history of bilateral cataract repair. From review of her past records, it appears that her creatinine over the past year has ranged between 1.8 - 2.2 with an elevation as high as 2. 7 about 2 months ago.  She was admitted to the hospital 3 days ago with 4-5 day history of progressively worsening shortness of breath, orthopnea and paroxysmal nocturnal dyspnea with intermittent nonproductive cough. This is apparently her third admission to the hospital over the past 3 months for similar complaints-there has been difficulty in assessing whether this is of pulmonary or cardiac etiology. She has had improvement with diuretic therapy and previous workup has been negative for PE.  She reports a strong familial history of renal disease-she has a sister as well as a brother on dialysis (both at [facility]). The patient states that she was unaware that she had a nephrectomy 3 years ago (thought it was only a kidney stone removal). She is also rather oblivious of having chronic kidney disease.  Past Medical History  Diagnosis Date  . Hypertension   . High cholesterol   . Kidney stones   . Type II diabetes mellitus   . GERD (gastroesophageal reflux disease)   . History of gout   . CAP (community acquired pneumonia) 01/09/2014    "first time I've ever had it"  . Diastolic congestive heart failure     Past Surgical History  Procedure Laterality Date  . Kidney stone surgery  10-05-12    "cut me on the side"   . Colonoscopy with propofol N/A 10/23/2012    Procedure: COLONOSCOPY WITH PROPOFOL;  Surgeon: [name], MD;  Location: [facility];  Service: Endoscopy;  Laterality: N/A;  . Abdominal hysterectomy  ~ 1974  . Lithotripsy  1980's?  . Cataract extraction w/ intraocular lens  implant, bilateral Bilateral 2014    Family History  Problem Relation Age of Onset  . Hypertension Mother   . CAD Neg Hx     Social History:  reports that she has never smoked. She has never used smokeless tobacco. She reports that she does not drink alcohol or use illicit drugs.  Allergies:  Allergies  Allergen Reactions  . Codeine Other (See Comments)    Unknown; pt can't remember. It was in the '70s    Medications:  Scheduled: . allopurinol  100 mg Oral BID  . amLODipine  10 mg Oral q morning - 10a  . aspirin EC  81 mg Oral Daily  . carvedilol  6.25 mg Oral BID WC  . furosemide  40 mg Intravenous BID  . heparin  5,000 Units Subcutaneous 3 times per day  . insulin aspart  0-15 Units Subcutaneous TID WC  . insulin aspart  0-5 Units Subcutaneous QHS  . latanoprost  1 drop Both Eyes QHS  . [START ON 04/15/2014] predniSONE  40 mg Oral Q breakfast  . simvastatin  20 mg Oral QPM  . sodium chloride  3 mL Intravenous Q12H    Results for orders placed or performed during the hospital encounter of 04/10/14 (from the past  48 hour(s))  Glucose, capillary     Status: Abnormal   Collection Time: 04/12/14  4:37 PM  Result Value Ref Range   Glucose-Capillary 153 (H) 70 - 99 mg/dL   Comment 1 Documented in Chart    Comment 2 Notify RN   Glucose, capillary     Status: Abnormal   Collection Time: 04/12/14  9:13 PM  Result Value Ref Range   Glucose-Capillary 278 (H) 70 - 99 mg/dL  Basic metabolic panel     Status: Abnormal   Collection Time: 04/13/14  4:00 AM  Result Value Ref Range   Sodium 138 135 - 145 mmol/L    Comment: Please note change in reference range.   Potassium 4.1 3.5 - 5.1 mmol/L     Comment: Please note change in reference range. DELTA CHECK NOTED    Chloride 103 96 - 112 mEq/L   CO2 27 19 - 32 mmol/L   Glucose, Bld 219 (H) 70 - 99 mg/dL   BUN 51 (H) 6 - 23 mg/dL   Creatinine, Ser 2.28 (H) 0.50 - 1.10 mg/dL   Calcium 9.3 8.4 - 10.5 mg/dL   GFR calc non Af Amer 20 (L) >90 mL/min   GFR calc Af Amer 23 (L) >90 mL/min    Comment: (NOTE) The eGFR has been calculated using the CKD EPI equation. This calculation has not been validated in all clinical situations. eGFR's persistently <90 mL/min signify possible Chronic Kidney Disease.    Anion gap 8 5 - 15  Glucose, capillary     Status: Abnormal   Collection Time: 04/13/14  6:46 AM  Result Value Ref Range   Glucose-Capillary 175 (H) 70 - 99 mg/dL  Glucose, capillary     Status: Abnormal   Collection Time: 04/13/14 11:55 AM  Result Value Ref Range   Glucose-Capillary 222 (H) 70 - 99 mg/dL   Comment 1 Documented in Chart    Comment 2 Notify RN   Glucose, capillary     Status: Abnormal   Collection Time: 04/13/14  5:03 PM  Result Value Ref Range   Glucose-Capillary 169 (H) 70 - 99 mg/dL   Comment 1 Documented in Chart    Comment 2 Notify RN   Glucose, capillary     Status: Abnormal   Collection Time: 04/13/14 10:33 PM  Result Value Ref Range   Glucose-Capillary 241 (H) 70 - 99 mg/dL   Comment 1 Documented in Chart    Comment 2 Notify RN   CBC     Status: Abnormal   Collection Time: 04/14/14  4:05 AM  Result Value Ref Range   WBC 10.4 4.0 - 10.5 K/uL   RBC 2.94 (L) 3.87 - 5.11 MIL/uL   Hemoglobin 8.3 (L) 12.0 - 15.0 g/dL   HCT 26.3 (L) 36.0 - 46.0 %   MCV 89.5 78.0 - 100.0 fL   MCH 28.2 26.0 - 34.0 pg   MCHC 31.6 30.0 - 36.0 g/dL   RDW 15.0 11.5 - 15.5 %   Platelets 271 150 - 400 K/uL  Basic metabolic panel     Status: Abnormal   Collection Time: 04/14/14  4:05 AM  Result Value Ref Range   Sodium 141 135 - 145 mmol/L    Comment: Please note change in reference range.   Potassium 4.0 3.5 - 5.1 mmol/L     Comment: Please note change in reference range.   Chloride 106 96 - 112 mEq/L   CO2 26 19 - 32 mmol/L  Glucose, Bld 218 (H) 70 - 99 mg/dL   BUN 61 (H) 6 - 23 mg/dL   Creatinine, Ser 2.23 (H) 0.50 - 1.10 mg/dL   Calcium 9.1 8.4 - 10.5 mg/dL   GFR calc non Af Amer 20 (L) >90 mL/min   GFR calc Af Amer 23 (L) >90 mL/min    Comment: (NOTE) The eGFR has been calculated using the CKD EPI equation. This calculation has not been validated in all clinical situations. eGFR's persistently <90 mL/min signify possible Chronic Kidney Disease.    Anion gap 9 5 - 15  Glucose, capillary     Status: Abnormal   Collection Time: 04/14/14  6:18 AM  Result Value Ref Range   Glucose-Capillary 208 (H) 70 - 99 mg/dL   Comment 1 Documented in Chart    Comment 2 Notify RN   Glucose, capillary     Status: Abnormal   Collection Time: 04/14/14 11:54 AM  Result Value Ref Range   Glucose-Capillary 247 (H) 70 - 99 mg/dL    Dg Chest 2 View  04/13/2014   CLINICAL DATA:  Shortness of breath, abnormal chest x-ray.  EXAM: CHEST  2 VIEW  COMPARISON:  February 10, 2015.  FINDINGS: Stable cardiomediastinal silhouette. Right upper lobe opacity noted on prior exam is improved although residual density remains. Left upper lobe and perihilar opacity is unchanged concerning for pneumonia. No pneumothorax or significant pleural effusion is noted. Bony thorax is intact.  IMPRESSION: Persistent left upper lobe and perihilar opacity is noted most consistent with pneumonia. Significantly improved right upper lobe pneumonia compared to prior exam.   Electronically Signed   By: Sabino Dick M.D.   On: 04/13/2014 18:38    Review of Systems  Constitutional: Positive for malaise/fatigue. Negative for fever, chills, weight loss and diaphoresis.  HENT: Negative.   Eyes: Negative.   Respiratory: Positive for cough and shortness of breath. Negative for hemoptysis, sputum production and wheezing.   Cardiovascular: Positive for orthopnea  and PND. Negative for chest pain, palpitations and claudication.  Gastrointestinal: Negative.   Genitourinary: Negative.   Musculoskeletal: Negative.   Skin: Negative.   Neurological: Positive for weakness. Negative for dizziness and tingling.  Endo/Heme/Allergies: Negative.   Psychiatric/Behavioral: Negative.   All other systems reviewed and are negative.  Blood pressure 131/53, pulse 79, temperature 98.4 F (36.9 C), temperature source Oral, resp. rate 20, height _0  (1.549 m), weight 97.8 kg (215 lb 9.8 oz), SpO2 97 %. Physical Exam  Nursing note and vitals reviewed. Constitutional: She is oriented to person, place, and time. She appears well-developed and well-nourished. No distress.  HENT:  Head: Normocephalic and atraumatic.  Nose: Nose normal.  Mouth/Throat: No oropharyngeal exudate.  Eyes: EOM are normal. Pupils are equal, round, and reactive to light. No scleral icterus.  Neck: Normal range of motion. Neck supple. No JVD present. No tracheal deviation present. No thyromegaly present.  Cardiovascular: Normal rate, regular rhythm and normal heart sounds.  Exam reveals no friction rub.   Respiratory: Effort normal. She has rales.  Bibasal rales with no obvious rhonchi/wheeze  GI: Soft. Bowel sounds are normal. She exhibits no distension. There is no tenderness. There is no rebound.  Musculoskeletal: Normal range of motion. She exhibits edema.  Trace ankle edema  Neurological: She is alert and oriented to person, place, and time. No cranial nerve deficit. Coordination normal.  Skin: Skin is warm and dry. No rash noted. No erythema.  Psychiatric: She has a normal mood and affect.  Assessment/Plan:  1. Chronic kidney disease stage IV: Appears to be hypertensive nephrosclerosis of a patient with a solitary kidney-unclear as to whether there is an element of diabetic kidney disease. She also likely has contributing injury from chronic cardiorenal syndrome-history of chronic  diastolic CHF. Renal ultrasound shows relative compensatory hypertrophy of the right kidney without any other obvious structural lesions. Given the presence of volume overload on physical exam-I think it is appropriate for titration of diuretic therapy for symptom/fluid status management. She is currently on furosemide 40 mg IV twice a day-this can be escalated to 80 mg IV twice a day if needed or switched to oral furosemide to torsemide when discharge is nearing. With regards to possible contrast exposure-appropriate precautions to take would be stoppage of diuretics for at least 24 hours before contrast exposure possibly 12-24 hours after. She is not on an ACE inhibitor or ARB that we need to discontinue and would likely be harmed by intravenous fluid therapy for contrast prophylaxis. A useful strategy would be one of contrast sparing and alternative testing if at all possible. 2. Acute hypoxic respiratory failure: Suspected to be acute exacerbation of diastolic heart failure-discussed with Dr. Stanford Breed, he plans on undertaking a right heart catheterization. 3. Hypertension: Fairly controlled on current management with amlodipine and carvedilol. 4. Screening for metabolic bone disease: Will check a PTH level as well as serum phosphorus level. 5. Screening for anemia of chronic kidney disease: Iron stores will be checked and replaced prior to starting ESA therapy.  Faiga Stones K. 04/14/2014, 2:26 PM

## 2014-04-14 NOTE — Progress Notes (Signed)
   Name: Doris Howell MRN: [id number] DOB: 1935/11/11    ADMISSION DATE:  04/10/2014 CONSULTATION DATE:  04/13/13    REFERRING MD :  Dr. Algis Liming   CHIEF COMPLAINT:  SOB   BRIEF PATIENT DESCRIPTION:  79 y/o F admitted with acute respiratory failure secondary to bilateral pulmonary infiltrates.  Work up suggestive of pulmonary edema.  PCCM consulted for evaluation.    SIGNIFICANT EVENTS  12/31  Admit with 4-5 d hx worsening SOB   STUDIES:  01/01  CXR >> biapical airspace disease, bilateral pleural effusions concerning for CHF   SUBJECTIVE:   Pt reports improved dyspnea, swelling of LE's.  Denies fever's, chills, cough, sputum production.   VITAL SIGNS: Temp:  [97.7 F (36.5 C)-98.4 F (36.9 C)] 98.4 F (36.9 C) (01/04 0445) Pulse Rate:  [76-79] 79 (01/04 0445) Resp:  [20] 20 (01/04 0445) BP: (131-147)/(53-98) 131/53 mmHg (01/04 0445) SpO2:  [97 %-100 %] 97 % (01/04 0445) Weight:  [215 lb 9.8 oz (97.8 kg)] 215 lb 9.8 oz (97.8 kg) (01/04 0445)  PHYSICAL EXAMINATION: General:  Older adult female in NAD Neuro:  AAOx4, speech clear, MAE  HEENT:  Mm pink/moist, no JVD Cardiovascular:  s1s2 rrr Lungs:  resp's even/non-labored, lungs bilaterally essentially clear Abdomen:  Round/soft, bsx4 active  Musculoskeletal:  No acute deformities  Skin:  Warm/dry, no edema    Recent Labs Lab 04/11/14 0558 04/13/14 0400 04/14/14 0405  NA 141 138 141  K 3.3* 4.1 4.0  CL 104 103 106  CO2 25 27 26   BUN 39* 51* 61*  CREATININE 2.05* 2.28* 2.23*  GLUCOSE 131* 219* 218*    Recent Labs Lab 04/10/14 2350 04/11/14 0558 04/14/14 0405  HGB 9.3* 8.9* 8.3*  HCT 29.0* 27.4* 26.3*  WBC 8.9 7.0 10.4  PLT 279 278 271   Dg Chest 2 View  04/13/2014   CLINICAL DATA:  Shortness of breath, abnormal chest x-ray.  EXAM: CHEST  2 VIEW  COMPARISON:  February 10, 2015.  FINDINGS: Stable cardiomediastinal silhouette. Right upper lobe opacity noted on prior exam is improved although residual  density remains. Left upper lobe and perihilar opacity is unchanged concerning for pneumonia. No pneumothorax or significant pleural effusion is noted. Bony thorax is intact.  IMPRESSION: Persistent left upper lobe and perihilar opacity is noted most consistent with pneumonia. Significantly improved right upper lobe pneumonia compared to prior exam.   Electronically Signed   By: Sabino Dick M.D.   On: 04/13/2014 18:38    ASSESSMENT / PLAN:  Acute hypoxic respiratory failure with recurrent b/l pulmonary infiltrates ?PNA (less likely) vs heart failure vs inflammatory process. Plan: Continue diuresis as renal fx allows Monitor off Abx since no clinical evidence for infection F/u CXR 2 view on 1/05 >> if infiltrates not improving, then consider non contrast CT chest Continue prednisone 40 mg daily for now >> likely can start to wean off soon  Hx of HTN. Plan: Per primary team  Noe Gens, NP-C [facility] Pgr: [phone] or [phone] 04/14/2014, 9:07 AM  Reviewed above, examined.  She still feels short of breath.  No cough.  Scattered rhonchi on exam, but no wheeze.  F/u CXR 1/05.  Continue diuresis, prednisone >> monitor off Abx.  Cards planning for cath >> will need renal evaluation due to concern about renal failure and need for contrast agent for cath  Chesley Mires, MD [place] 04/14/2014, 2:00 PM Pager:  [phone] After 3pm call: [phone] .

## 2014-04-14 NOTE — Progress Notes (Signed)
Inpatient Diabetes Program Recommendations  AACE/ADA: New Consensus Statement on Inpatient Glycemic Control (2013)  Target Ranges:  Prepandial:   less than 140 mg/dL      Peak postprandial:   less than 180 mg/dL (1-2 hours)      Critically ill patients:  140 - 180 mg/dL   Inpatient Diabetes Program Recommendations Correction (SSI): increase to resistant scale during steroid therapy Thank you  Toshie Demelo BSN, RN,CDE Inpatient Diabetes Coordinator [phone] (team pager)   

## 2014-04-14 NOTE — Evaluation (Signed)
Physical Therapy Evaluation Patient Details Name: Doris Howell MRN: [id number] DOB: 06-28-1935 Today's Date: 04/14/2014   History of Present Illness  Pt adm with SOB and found to acute on chronic heart failure. PMH- DM, CKD, HTN, gout  Clinical Impression  Pt admitted with above diagnosis. Pt currently with functional limitations due to the deficits listed below (see PT Problem List).  Pt will benefit from skilled PT to increase their independence and safety with mobility to allow discharge to the venue listed below.  Expect will be able to return home with family.     Follow Up Recommendations No PT follow up    Equipment Recommendations  None recommended by PT    Recommendations for Other Services       Precautions / Restrictions Precautions Precautions: Fall      Mobility  Bed Mobility Overal bed mobility: Needs Assistance Bed Mobility: Sit to Supine;Supine to Sit     Supine to sit: Modified independent (Device/Increase time) Sit to supine: Min assist   General bed mobility comments: assist to bring feet back into bed.  Transfers Overall transfer level: Needs assistance Equipment used: 1 person hand held assist Transfers: Sit to/from Stand Sit to Stand: Min guard         General transfer comment: Assist for balance  Ambulation/Gait Ambulation/Gait assistance: Min assist Ambulation Distance (Feet): 110 Feet Assistive device: 1 person hand held assist Gait Pattern/deviations: Step-through pattern;Decreased stride length   Gait velocity interpretation: Below normal speed for age/gender General Gait Details: Slightly unsteady gait requiring min A. Dyspnea 3/4 on 2L.  Stairs            Wheelchair Mobility    Modified Rankin (Stroke Patients Only)       Balance Overall balance assessment: Needs assistance Sitting-balance support: No upper extremity supported;Feet supported Sitting balance-Leahy Scale: Good     Standing balance support: No  upper extremity supported Standing balance-Leahy Scale: Fair                               Pertinent Vitals/Pain Pain Assessment: No/denies pain    Home Living Family/patient expects to be discharged to:: Private residence Living Arrangements: Spouse/significant other Available Help at Discharge: Family;Available PRN/intermittently Type of Home: House Home Access: Stairs to enter Entrance Stairs-Rails: None Entrance Stairs-Number of Steps: 1 Home Layout: One level Home Equipment: Walker - 2 wheels Additional Comments: pt indicates her husband has visual deficits.      Prior Function Level of Independence: Independent               Hand Dominance        Extremity/Trunk Assessment   Upper Extremity Assessment: Overall WFL for tasks assessed           Lower Extremity Assessment: Overall WFL for tasks assessed         Communication   Communication: No difficulties  Cognition Arousal/Alertness: Awake/alert Behavior During Therapy: WFL for tasks assessed/performed Overall Cognitive Status: Within Functional Limits for tasks assessed                      General Comments      Exercises        Assessment/Plan    PT Assessment Patient needs continued PT services  PT Diagnosis Difficulty walking   PT Problem List Decreased activity tolerance;Decreased balance;Decreased mobility  PT Treatment Interventions DME instruction;Gait training;Functional mobility training;Therapeutic activities;Therapeutic exercise;Patient/family education;Balance  training   PT Goals (Current goals can be found in the Care Plan section) Acute Rehab PT Goals Patient Stated Goal: return home PT Goal Formulation: With patient Time For Goal Achievement: 04/21/14 Potential to Achieve Goals: Good    Frequency Min 3X/week   Barriers to discharge        Co-evaluation               End of Session Equipment Utilized During Treatment: Oxygen Activity  Tolerance: Patient limited by fatigue Patient left: in bed;with call bell/phone within reach           Time: 1505-1517 PT Time Calculation (min) (ACUTE ONLY): 12 min   Charges:   PT Evaluation $Initial PT Evaluation Tier I: 1 Procedure PT Treatments $Gait Training: 8-22 mins   PT G Codes:        Syvanna Ciolino 2014/05/03, 3:32 PM  [facility] PT [phone]

## 2014-04-14 NOTE — Progress Notes (Signed)
PROGRESS NOTE    Doris Howell [id number] DOB: [birth year] DOA: 04/10/2014 PCP: Irven Shelling, MD  HPI/Brief narrative 79 year old female patient with history of HTN, HLD, DM 2, GERD, gout, CKD, anemia, OSA, presented with acute/subacute onset of dyspnea and hypoxia. Lifelong nonsmoker but exposed to secondhand smoke exposure spouse. This is her third admission in the last 3 months for similar presentation. Assessed to have acute on chronic diastolic CHF. Improved with diuretics. Cardiology felt that her presentation was more in keeping with a pulmonary etiology. Pulmonary consulted on 04/13/14 and felt all her presentation was primarily cardiac including previous admission.   Assessment/Plan:  1. Acute respiratory failure with hypoxia: Most likely secondary to acute diastolic CHF. She was diuresed with IV Lasix and has clinically improved. VQ scan during recent admission was negative. As per cardiology and pulmonary follow-up, agree her presentation is most consistent with decompensated CHF rather than infectious etiology i.e. pneumonia. Hence antibiotics discontinued 04/14/13. Treat underlying cause and titrate oxygen. 2. Acute on chronic diastolic CHF: Cardiology follow-up appreciated. Continue IV Lasix. Cardiology has requested nephrology to weigh in on diuretic dose. Follow clinically and by chest x-ray. Cardiology plans RHC. 3. Bradycardia: Asymptomatic. Coreg was reduced to 6.25 MG twice a day. Bradycardia resolved. 4. Mildly elevated troponin: Likely secondary to demand ischemia from acute respiratory failure and acute diastolic CHF. As per cardiology, outpatient stress test since LHC carries high risk of contrast nephropathy and pushing her towards HD. 5. Essential hypertension: Controlled. Continue amlodipine and carvedilol. 6. Hyperlipidemia: Continue simvastatin 7. Uncontrolled DM 2: Continue NovoLog SSI. Worsened by steroids-reducing steroids. Oral hypoglycemics on  hold. 8. Stage III chronic kidney disease: Baseline creatinine probably in the range of 1.9-2.2. Creatinine close to baseline. Follow BMP. Nephrology consulted by cardiology-input pending.  9. Chronic anemia: Likely secondary to chronic kidney disease. Gradual drift down of hemoglobin. Follow CBCs. 10. Hypokalemia: Replaced 11. Suspected OHS/OSA: Outpatient workup with sleep study   Code Status: Full Family Communication: None at bedside Disposition Plan: Not medically stable for discharge   Consultants:  Cardiology  Pulmonology  Nephrology  Procedures:  None  Antibiotics:  IV levofloxacin -DC'd  Subjective: Dyspnea has improved. Still dyspneic with minimal exertion. No chest pain or cough.  Objective: Filed Vitals:   04/13/14 0916 04/13/14 1400 04/13/14 1948 04/14/14 0445  BP: 138/68 139/60 147/98 131/53  Pulse: 76 76 78 79  Temp:  97.7 F (36.5 C) 98.3 F (36.8 C) 98.4 F (36.9 C)  TempSrc:  Oral Oral Oral  Resp: 20 20 20 20   Height:      Weight:    97.8 kg (215 lb 9.8 oz)  SpO2: 100% 100% 100% 97%    Intake/Output Summary (Last 24 hours) at 04/14/14 1156 Last data filed at 04/14/14 1021  Gross per 24 hour  Intake    960 ml  Output   1700 ml  Net   -740 ml   Filed Weights   04/12/14 0547 04/13/14 0500 04/14/14 0445  Weight: 90.436 kg (199 lb 6 oz) 90.719 kg (200 lb) 97.8 kg (215 lb 9.8 oz)     Exam:  General exam: Pleasant elderly female lying comfortably propped up in bed. Respiratory system: Diminished breath sounds in the bases with occasional bibasilar fine crackles. Rest of lung fields clear to auscultation. No increased work of breathing. No wheezing or rhonchi. Cardiovascular system: S1 & S2 heard, RRR. No JVD, murmurs, gallops, clicks or pedal edema. Telemetry: Sinus rhythm with occasional PVCs. Gastrointestinal system: Abdomen  is nondistended, soft and nontender. Normal bowel sounds heard. Central nervous system: Alert and oriented. No  focal neurological deficits. Extremities: Symmetric 5 x 5 power.   Data Reviewed: Basic Metabolic Panel:  Recent Labs Lab 04/10/14 2350 04/11/14 0558 04/13/14 0400 04/14/14 0405  NA 139 141 138 141  K 3.2* 3.3* 4.1 4.0  CL 103 104 103 106  CO2 26 25 27 26   GLUCOSE 187* 131* 219* 218*  BUN 40* 39* 51* 61*  CREATININE 2.12* 2.05* 2.28* 2.23*  CALCIUM 9.7 9.3 9.3 9.1   Liver Function Tests:  Recent Labs Lab 04/11/14 0558  AST 26  ALT 18  ALKPHOS 86  BILITOT 0.5  PROT 7.3  ALBUMIN 3.2*   No results for input(s): LIPASE, AMYLASE in the last 168 hours. No results for input(s): AMMONIA in the last 168 hours. CBC:  Recent Labs Lab 04/10/14 2350 04/11/14 0558 04/14/14 0405  WBC 8.9 7.0 10.4  HGB 9.3* 8.9* 8.3*  HCT 29.0* 27.4* 26.3*  MCV 87.9 90.7 89.5  PLT 279 278 271   Cardiac Enzymes:  Recent Labs Lab 04/11/14 0558  TROPONINI 0.06*   BNP (last 3 results)  Recent Labs  01/09/14 1515 02/05/14 1216  PROBNP 2595.0* 2767.0*   CBG:  Recent Labs Lab 04/13/14 0646 04/13/14 1155 04/13/14 1703 04/13/14 2233 04/14/14 0618  GLUCAP 175* 222* 169* 241* 208*    No results found for this or any previous visit (from the past 240 hour(s)).       Studies: Dg Chest 2 View  04/13/2014   CLINICAL DATA:  Shortness of breath, abnormal chest x-ray.  EXAM: CHEST  2 VIEW  COMPARISON:  February 10, 2015.  FINDINGS: Stable cardiomediastinal silhouette. Right upper lobe opacity noted on prior exam is improved although residual density remains. Left upper lobe and perihilar opacity is unchanged concerning for pneumonia. No pneumothorax or significant pleural effusion is noted. Bony thorax is intact.  IMPRESSION: Persistent left upper lobe and perihilar opacity is noted most consistent with pneumonia. Significantly improved right upper lobe pneumonia compared to prior exam.   Electronically Signed   By: Sabino Dick M.D.   On: 04/13/2014 18:38        Scheduled  Meds: . allopurinol  100 mg Oral BID  . amLODipine  10 mg Oral q morning - 10a  . aspirin EC  81 mg Oral Daily  . carvedilol  6.25 mg Oral BID WC  . furosemide  40 mg Intravenous BID  . heparin  5,000 Units Subcutaneous 3 times per day  . insulin aspart  0-15 Units Subcutaneous TID WC  . insulin aspart  0-5 Units Subcutaneous QHS  . latanoprost  1 drop Both Eyes QHS  . [START ON 04/15/2014] predniSONE  40 mg Oral Q breakfast  . simvastatin  20 mg Oral QPM  . sodium chloride  3 mL Intravenous Q12H   Continuous Infusions:   Principal Problem:   Diastolic CHF, acute on chronic Active Problems:   CKD (chronic kidney disease), stage III   HCAP (healthcare-associated pneumonia)   Diabetes mellitus   Essential hypertension   Gout   SOB (shortness of breath)   Obesity (BMI 37)   Bradycardia- beta blocker decreased    Time spent: 30 minutes    Bridie Colquhoun, MD, FACP, FHM. [facility] Pager [phone]  If 7PM-7AM, please contact night-coverage [email or web address] Password [email or web address] 04/14/2014, 11:56 AM    LOS: 4 days

## 2014-04-14 NOTE — Care Management Note (Addendum)
    Page 1 of 2   04/20/2014     8:28:40 AM CARE MANAGEMENT NOTE 04/20/2014  Patient:  Doris Howell, Doris Howell   Account Number:  [id number]  Date Initiated:  04/14/2014  Documentation initiated by:  HUTCHINSON,CRYSTAL  Subjective/Objective Assessment:   CHF     Action/Plan:   CM to follow for disposition needs   Anticipated DC Date:  04/16/2014   Anticipated DC Plan:  [place]  CM consult      [facility] Choice  HOME HEALTH   Choice offered to / List presented to:  C-1 Patient        [place] arranged  HH-1 RN  [place].   Status of service:  Completed, signed off Medicare Important Message given?  YES (If response is "NO", the following Medicare IM given date fields will be blank) Date Medicare IM given:  04/14/2014 Medicare IM given by:  HUTCHINSON,CRYSTAL Date Additional Medicare IM given:  04/18/2014 Additional Medicare IM given by:  CRYSTAL HUTCHINSON  Discharge Disposition:  [place]  Per UR Regulation:  Reviewed for med. necessity/level of care/duration of stay  If discussed at [place] of Stay Meetings, dates discussed:   04/15/2014  04/17/2014    Comments:  04/19/14 09:00 CM met with pt in room to offer choice of home health agency.  Pt chooses [facility] to render HHPT/RN.  Address and contact information verified with pt.  Referral called to [facility] rep, Stephanie.  Pt has walker at home and no other DME needed.  No other CM needs were communicated.  Mariane Masters, BSN, CM [phone].   Crystal Hutchinson RN, BSN, MSHL, CCM  Nurse - Case Manager,  (Unit [place]) [phone]  04/18/2014 Social:  From home with family PT RECS:  None

## 2014-04-15 ENCOUNTER — Encounter (HOSPITAL_COMMUNITY)
Admission: EM | Disposition: A | Discharge status: Home Health | Payer: Self-pay | Source: Home / Self Care | Attending: Internal Medicine

## 2014-04-15 ENCOUNTER — Inpatient Hospital Stay (HOSPITAL_COMMUNITY): Payer: Medicare Other

## 2014-04-15 HISTORY — PX: RIGHT HEART CATHETERIZATION: SHX5447

## 2014-04-15 LAB — RENAL FUNCTION PANEL
Albumin: 2.8 g/dL — ABNORMAL LOW (ref 3.5–5.2)
Anion gap: 9 (ref 5–15)
BUN: 70 mg/dL — ABNORMAL HIGH (ref 6–23)
CO2: 28 mmol/L (ref 19–32)
Calcium: 8.9 mg/dL (ref 8.4–10.5)
Chloride: 104 mEq/L (ref 96–112)
Creatinine, Ser: 2.34 mg/dL — ABNORMAL HIGH (ref 0.50–1.10)
GFR calc Af Amer: 22 mL/min — ABNORMAL LOW (ref >90)
GFR calc non Af Amer: 19 mL/min — ABNORMAL LOW (ref >90)
Glucose, Bld: 145 mg/dL — ABNORMAL HIGH (ref 70–99)
Phosphorus: 4.7 mg/dL — ABNORMAL HIGH (ref 2.3–4.6)
Potassium: 4.2 mmol/L (ref 3.5–5.1)
Sodium: 141 mmol/L (ref 135–145)

## 2014-04-15 LAB — GLUCOSE, CAPILLARY
Glucose-Capillary: 150 mg/dL — ABNORMAL HIGH (ref 70–99)
Glucose-Capillary: 145 mg/dL — ABNORMAL HIGH (ref 70–99)
Glucose-Capillary: 238 mg/dL — ABNORMAL HIGH (ref 70–99)
Glucose-Capillary: 330 mg/dL — ABNORMAL HIGH (ref 70–99)

## 2014-04-15 LAB — CBC
HCT: 25.7 % — ABNORMAL LOW (ref 36.0–46.0)
Hemoglobin: 8.1 g/dL — ABNORMAL LOW (ref 12.0–15.0)
MCH: 28.3 pg (ref 26.0–34.0)
MCHC: 31.5 g/dL (ref 30.0–36.0)
MCV: 89.9 fL (ref 78.0–100.0)
Platelets: 278 10*3/uL (ref 150–400)
RBC: 2.86 MIL/uL — ABNORMAL LOW (ref 3.87–5.11)
RDW: 15.1 % (ref 11.5–15.5)
WBC: 10.3 10*3/uL (ref 4.0–10.5)

## 2014-04-15 LAB — POCT I-STAT 3, VENOUS BLOOD GAS (G3P V)
Acid-Base Excess: 4 mmol/L — ABNORMAL HIGH (ref 0.0–2.0)
Acid-Base Excess: 5 mmol/L — ABNORMAL HIGH (ref 0.0–2.0)
Bicarbonate: 30.1 mEq/L — ABNORMAL HIGH (ref 20.0–24.0)
Bicarbonate: 31.3 mEq/L — ABNORMAL HIGH (ref 20.0–24.0)
O2 Saturation: 70 %
O2 Saturation: 72 %
TCO2: 32 mmol/L (ref 0–100)
TCO2: 33 mmol/L (ref 0–100)
pCO2, Ven: 55.3 mmHg — ABNORMAL HIGH (ref 45.0–50.0)
pCO2, Ven: 55.5 mmHg — ABNORMAL HIGH (ref 45.0–50.0)
pH, Ven: 7.344 — ABNORMAL HIGH (ref 7.250–7.300)
pH, Ven: 7.359 — ABNORMAL HIGH (ref 7.250–7.300)
pO2, Ven: 39 mmHg (ref 30.0–45.0)
pO2, Ven: 41 mmHg (ref 30.0–45.0)

## 2014-04-15 LAB — BRAIN NATRIURETIC PEPTIDE: B Natriuretic Peptide: 591 pg/mL — ABNORMAL HIGH (ref 0.0–100.0)

## 2014-04-15 SURGERY — RIGHT HEART CATH
Anesthesia: LOCAL

## 2014-04-15 MED ORDER — LIDOCAINE HCL (PF) 1 % IJ SOLN
INTRAMUSCULAR | Status: AC
  Filled 2014-04-15: qty 30

## 2014-04-15 MED ORDER — MIDAZOLAM HCL 2 MG/2ML IJ SOLN
INTRAMUSCULAR | Status: AC
  Filled 2014-04-15: qty 2

## 2014-04-15 MED ORDER — HEPARIN (PORCINE) IN NACL 2-0.9 UNIT/ML-% IJ SOLN
INTRAMUSCULAR | Status: AC
  Filled 2014-04-15: qty 500

## 2014-04-15 MED ORDER — FENTANYL CITRATE 0.05 MG/ML IJ SOLN
INTRAMUSCULAR | Status: AC
  Filled 2014-04-15: qty 2

## 2014-04-15 NOTE — Progress Notes (Signed)
PROGRESS NOTE    Doris Howell [id number] DOB: [birth year] DOA: 04/10/2014 PCP: Irven Shelling, MD  HPI/Brief narrative 79 year old female patient with history of HTN, HLD, DM 2, GERD, gout, CKD, anemia, OSA, presented with acute/subacute onset of dyspnea and hypoxia. Lifelong nonsmoker but exposed to secondhand smoke exposure spouse. This is her third admission in the last 3 months for similar presentation. Assessed to have acute on chronic diastolic CHF. Improved with diuretics. Cardiology felt that her presentation was more in keeping with a pulmonary etiology. Pulmonary consulted on 04/13/14 and felt all her presentation was primarily cardiac including previous admission.   Assessment/Plan:  1. Acute respiratory failure with hypoxia: Most likely secondary to acute diastolic CHF. She was diuresed with IV Lasix and has clinically improved. VQ scan during recent admission was negative. As per cardiology and pulmonary follow-up, agree her presentation is most consistent with decompensated CHF rather than infectious etiology i.e. pneumonia. Hence antibiotics discontinued 04/14/13. Treat underlying cause and titrate oxygen. Chest x-ray actually appears worse. 2. Acute on chronic diastolic CHF: Cardiology follow-up appreciated. Lasix was resumed by cardiology yesterday but creatinine has worsened today. Lasix held. Plan for RHC today to clarify primary process-CHF versus primary pulmonary. 3. Bradycardia: Coreg was reduced to 6.25 MG twice a day. Bradycardia resolved. 4. Mildly elevated troponin: Likely secondary to demand ischemia from acute respiratory failure and acute diastolic CHF. As per cardiology, outpatient stress test since LHC carries high risk of contrast nephropathy and pushing her towards HD. 5. Essential hypertension: Controlled. Continue amlodipine and carvedilol. 6. Hyperlipidemia: Continue simvastatin 7. Uncontrolled DM 2: Continue NovoLog SSI. Worsened by  steroids-reducing steroids. Oral hypoglycemics on hold. Fluctuating CBGs-may need insulin adjustment. 8. Stage 4 chronic kidney disease: Baseline creatinine probably in the range of 1.9-2.2. Creatinine has slightly worsened to 2.34 today. Nephrology input appreciated. Feels that her chronic kidney disease is related to hypertensive nephrosclerosis in patient with solitary kidney and may be an element of diabetic nephropathy. Acute kidney injury hemodynamically mediated due to diuretic use. 9. Chronic anemia: Likely secondary to chronic kidney disease. Gradual drift down of hemoglobin. Follow CBCs. Checking iron stores. 10. Hypokalemia: Replaced 11. Suspected OHS/OSA: Outpatient workup with sleep study   Code Status: Full Family Communication: None at bedside Disposition Plan: Not medically stable for discharge   Consultants:  Cardiology  Pulmonology  Nephrology  Procedures:  None  Antibiotics:  IV levofloxacin -DC'd  Subjective: Still complains of DOE. Patient was seen this morning prior to cath.  Objective: Filed Vitals:   04/14/14 2025 04/14/14 2347 04/15/14 0516 04/15/14 1100  BP: 136/67  142/64 127/52  Pulse: 77  71 67  Temp: 98.2 F (36.8 C)  97.9 F (36.6 C)   TempSrc: Oral  Oral   Resp: 18  18   Height:      Weight:  97.8 kg (215 lb 9.8 oz) 91.309 kg (201 lb 4.8 oz)   SpO2: 98%  97%     Intake/Output Summary (Last 24 hours) at 04/15/14 1531 Last data filed at 04/15/14 1505  Gross per 24 hour  Intake    363 ml  Output   2500 ml  Net  -2137 ml   Filed Weights   04/14/14 0445 04/14/14 2347 04/15/14 0516  Weight: 97.8 kg (215 lb 9.8 oz) 97.8 kg (215 lb 9.8 oz) 91.309 kg (201 lb 4.8 oz)     Exam:  General exam: Pleasant elderly female lying comfortably propped up in bed. Respiratory system: Diminished breath sounds in  the bases. Rest of lung fields clear to auscultation. No increased work of breathing. No wheezing or rhonchi. Cardiovascular system: S1  & S2 heard, RRR. No JVD, murmurs, gallops, clicks or pedal edema. Telemetry: Sinus rhythm. Gastrointestinal system: Abdomen is nondistended, soft and nontender. Normal bowel sounds heard. Central nervous system: Alert and oriented. No focal neurological deficits. Extremities: Symmetric 5 x 5 power.   Data Reviewed: Basic Metabolic Panel:  Recent Labs Lab 04/10/14 2350 04/11/14 0558 04/13/14 0400 04/14/14 0405 04/15/14 0408  NA 139 141 138 141 141  K 3.2* 3.3* 4.1 4.0 4.2  CL 103 104 103 106 104  CO2 26 25 27 26 28   GLUCOSE 187* 131* 219* 218* 145*  BUN 40* 39* 51* 61* 70*  CREATININE 2.12* 2.05* 2.28* 2.23* 2.34*  CALCIUM 9.7 9.3 9.3 9.1 8.9  PHOS  --   --   --   --  4.7*   Liver Function Tests:  Recent Labs Lab 04/11/14 0558 04/15/14 0408  AST 26  --   ALT 18  --   ALKPHOS 86  --   BILITOT 0.5  --   PROT 7.3  --   ALBUMIN 3.2* 2.8*   No results for input(s): LIPASE, AMYLASE in the last 168 hours. No results for input(s): AMMONIA in the last 168 hours. CBC:  Recent Labs Lab 04/10/14 2350 04/11/14 0558 04/14/14 0405 04/15/14 0408  WBC 8.9 7.0 10.4 10.3  HGB 9.3* 8.9* 8.3* 8.1*  HCT 29.0* 27.4* 26.3* 25.7*  MCV 87.9 90.7 89.5 89.9  PLT 279 278 271 278   Cardiac Enzymes:  Recent Labs Lab 04/11/14 0558  TROPONINI 0.06*   BNP (last 3 results)  Recent Labs  01/09/14 1515 02/05/14 1216  PROBNP 2595.0* 2767.0*   CBG:  Recent Labs Lab 04/14/14 1154 04/14/14 1622 04/14/14 2101 04/15/14 0609 04/15/14 1112  GLUCAP 247* 277* 214* 150* 145*    No results found for this or any previous visit (from the past 240 hour(s)).       Studies: Dg Chest 2 View  04/13/2014   CLINICAL DATA:  Shortness of breath, abnormal chest x-ray.  EXAM: CHEST  2 VIEW  COMPARISON:  February 10, 2015.  FINDINGS: Stable cardiomediastinal silhouette. Right upper lobe opacity noted on prior exam is improved although residual density remains. Left upper lobe and perihilar  opacity is unchanged concerning for pneumonia. No pneumothorax or significant pleural effusion is noted. Bony thorax is intact.  IMPRESSION: Persistent left upper lobe and perihilar opacity is noted most consistent with pneumonia. Significantly improved right upper lobe pneumonia compared to prior exam.   Electronically Signed   By: Sabino Dick M.D.   On: 04/13/2014 18:38   Dg Chest Port 1 View  04/15/2014   CLINICAL DATA:  Shortness of breath, pulmonary infiltrates  EXAM: PORTABLE CHEST - 1 VIEW  COMPARISON:  04/13/2014  FINDINGS: Cardiomediastinal silhouette is stable. Persistent left upper lobe and left perihilar infiltrates. New infiltrates right perihilar and right infrahilar region. Question small left pleural effusion. Findings suspicious for worsening multifocal pneumonia rather than pulmonary edema.  IMPRESSION: Persistent left upper lobe and left perihilar infiltrates. New infiltrates right perihilar and right infrahilar region. Question small left pleural effusion. Findings suspicious for worsening multifocal pneumonia rather than pulmonary edema.   Electronically Signed   By: Lahoma Crocker M.D.   On: 04/15/2014 08:08        Scheduled Meds: . [MAR Hold] allopurinol  100 mg Oral BID  . [facility] Hold]  amLODipine  10 mg Oral q morning - 10a  . [MAR Hold] aspirin EC  81 mg Oral Daily  . [MAR Hold] carvedilol  6.25 mg Oral BID WC  . [MAR Hold] heparin  5,000 Units Subcutaneous 3 times per day  . [MAR Hold] insulin aspart  0-15 Units Subcutaneous TID WC  . [MAR Hold] insulin aspart  0-5 Units Subcutaneous QHS  . [MAR Hold] latanoprost  1 drop Both Eyes QHS  . [MAR Hold] predniSONE  40 mg Oral Q breakfast  . [MAR Hold] simvastatin  20 mg Oral QPM  . [MAR Hold] sodium chloride  3 mL Intravenous Q12H  . sodium chloride  3 mL Intravenous Q12H   Continuous Infusions: . sodium chloride 20 mL/hr (04/15/14 1103)    Principal Problem:   Diastolic CHF, acute on chronic Active Problems:   CKD  (chronic kidney disease), stage III   HCAP (healthcare-associated pneumonia)   Diabetes mellitus   Essential hypertension   Gout   SOB (shortness of breath)   Obesity (BMI 37)   Bradycardia- beta blocker decreased   Pulmonary infiltrates    Time spent: 30 minutes    Winnell Bento, MD, FACP, FHM. [facility] Pager [phone]  If 7PM-7AM, please contact night-coverage [email or web address] Password [email or web address] 04/15/2014, 3:31 PM    LOS: 5 days

## 2014-04-15 NOTE — Op Note (Signed)
CARDIAC CATHETERIZATION REPORT   Procedures performed:  1. Right heart catheterization   Reason for procedure:  Congestive heart failure Acute on chronic kidney failure  Procedure performed by: Sanda Klein, MD, [facility]  Complications: none   Estimated blood loss: less than 5 mL   History:  obese 79 year old female with history of hypertension, hyperlipidemia, diabetes, GERD, chronic kidney disease, anemia and OSA admitted for hypoxia. Cardiology consulted for diastolic HF. This is the third admission in the last 3 months. Diuresis has led to partial improvement in dyspnea with significant worsening of renal function.  Consent: The risks, benefits, and details of the procedure were explained to the patient. Risks including death, DVT, bleeding, limb injury were described and accepted by the patient. Informed written consent was obtained prior to proceeding.  Technique: The patient was brought to the cardiac catheterization laboratory in the fasting state. She was prepped and draped in the usual sterile fashion. Local anesthesia with 1% lidocaine was administered to the right groin area. Using the modified Seldinger technique a 7 French right common femoral vein sheath was introduced without difficulty. Under fluoroscopic guidance, using a balloon tipped Swan-Ganz 58F catheter, pressures were recorded in the right atrium, right ventricle , main pulmonary artery and PA wedge position. A sample of mixed venous blood was drawn from the main PA for cardiac output estimation.  Hemodynamic findings:  Systemic (cuff) pressure 149/73 (mean 96) mm Hg    PA wedge pressure a wave 26, v wave 48 (mean 26) mm Hg  Pulmonary artery 64/25 (mean 40) mm Hg  Right ventricle 60/9 with an end-diastolic pressure of 15 mm Hg  Right atrium a wave 18, v wave 14 (mean 11) mm Hg  Cardiac output is 7.8 L per minute (cardiac index 4.1 L per minute per meter sq)  Saturation on 2L O2 by nasal cannula: systemic  (finger pulse oximeter) 99%, main PA 70%   IMPRESSIONS:  Biventricular heart failure with preserved LVEF. 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 Increased CO, possibly related to anemia.  RECOMMENDATION:  Aggressive diuresis - possibly will need hemodialysis     Sanda Klein, MD, [facility] [phone] office [phone] pager

## 2014-04-15 NOTE — Progress Notes (Signed)
PT Cancellation Note  Patient Details Name: Doris Howell MRN: [id number] DOB: Jul 23, 1935   Cancelled Treatment:    Reason Eval/Treat Not Completed: Patient at procedure or test/unavailable. At cath lab   Duncan Dull 04/15/2014, 3:32 PM Alben Deeds, [place] DPT  [phone]

## 2014-04-15 NOTE — Progress Notes (Signed)
Patient ID: Doris Howell, female   DOB: 01-16-1936, 79 y.o.   MRN: [id number]  [facility] Progress Note    Assessment/ Plan:   1. Chronic kidney disease stage IV: Appears to be hypertensive nephrosclerosis of a patient with a solitary kidney-unclear as to whether there is an element of diabetic kidney disease. Acute renal injury component is likely hemodynamically mediated with recent diuretic use-awaiting right heart catheterization today (diuretics on hold due to worsening renal function). Surprisingly, chest x-ray reported as looking worse-will screen for the possibility of ANCA vasculitis/ANA status. 2. Acute hypoxic respiratory failure: Appears to be bi-factorial from CHF exacerbation/possible pulmonary process-on diuretic therapy (currently on hold) and prednisone. 3. Hypertension: Fairly controlled on current management with amlodipine and carvedilol. 4. Screening for metabolic bone disease: Will check a PTH level as well as serum phosphorus level. 5. Screening for anemia of chronic kidney disease: Iron stores will be checked and replaced prior to starting ESA therapy.  Subjective:   Reports to be feeling tired this morning, denies any shortness of breath or chest pain    Objective:   BP 142/64 mmHg  Pulse 71  Temp(Src) 97.9 F (36.6 C) (Oral)  Resp 18  Ht 5\' 1"  (1.549 m)  Wt 91.309 kg (201 lb 4.8 oz)  BMI 38.05 kg/m2  SpO2 97%  Intake/Output Summary (Last 24 hours) at 04/15/14 1014 Last data filed at 04/15/14 1008  Gross per 24 hour  Intake    603 ml  Output   1700 ml  Net  -1097 ml   Weight change: 0 kg (0 lb)  Physical Exam: Gen: Comfortably sleeping in bed CVS: Pulse regular in rate and rhythm Resp: Clear to auscultation bilaterally-no rales Abd: Soft, obese, nontender Ext: No lower extremity edema  Imaging: Dg Chest 2 View  04/13/2014   CLINICAL DATA:  Shortness of breath, abnormal chest x-ray.  EXAM: CHEST  2 VIEW  COMPARISON:  February 10, 2015.  FINDINGS: Stable cardiomediastinal silhouette. Right upper lobe opacity noted on prior exam is improved although residual density remains. Left upper lobe and perihilar opacity is unchanged concerning for pneumonia. No pneumothorax or significant pleural effusion is noted. Bony thorax is intact.  IMPRESSION: Persistent left upper lobe and perihilar opacity is noted most consistent with pneumonia. Significantly improved right upper lobe pneumonia compared to prior exam.   Electronically Signed   By: Sabino Dick M.D.   On: 04/13/2014 18:38   Dg Chest Port 1 View  04/15/2014   CLINICAL DATA:  Shortness of breath, pulmonary infiltrates  EXAM: PORTABLE CHEST - 1 VIEW  COMPARISON:  04/13/2014  FINDINGS: Cardiomediastinal silhouette is stable. Persistent left upper lobe and left perihilar infiltrates. New infiltrates right perihilar and right infrahilar region. Question small left pleural effusion. Findings suspicious for worsening multifocal pneumonia rather than pulmonary edema.  IMPRESSION: Persistent left upper lobe and left perihilar infiltrates. New infiltrates right perihilar and right infrahilar region. Question small left pleural effusion. Findings suspicious for worsening multifocal pneumonia rather than pulmonary edema.   Electronically Signed   By: Lahoma Crocker M.D.   On: 04/15/2014 08:08    Labs: BMET  Recent Labs Lab 04/10/14 2350 04/11/14 0558 04/13/14 0400 04/14/14 0405 04/15/14 0408  NA 139 141 138 141 141  K 3.2* 3.3* 4.1 4.0 4.2  CL 103 104 103 106 104  CO2 26 25 27 26 28   GLUCOSE 187* 131* 219* 218* 145*  BUN 40* 39* 51* 61* 70*  CREATININE 2.12* 2.05*  2.28* 2.23* 2.34*  CALCIUM 9.7 9.3 9.3 9.1 8.9  PHOS  --   --   --   --  4.7*   CBC  Recent Labs Lab 04/10/14 2350 04/11/14 0558 04/14/14 0405 04/15/14 0408  WBC 8.9 7.0 10.4 10.3  HGB 9.3* 8.9* 8.3* 8.1*  HCT 29.0* 27.4* 26.3* 25.7*  MCV 87.9 90.7 89.5 89.9  PLT 279 278 271 278    Medications:    .  allopurinol  100 mg Oral BID  . amLODipine  10 mg Oral q morning - 10a  . aspirin EC  81 mg Oral Daily  . carvedilol  6.25 mg Oral BID WC  . heparin  5,000 Units Subcutaneous 3 times per day  . insulin aspart  0-15 Units Subcutaneous TID WC  . insulin aspart  0-5 Units Subcutaneous QHS  . latanoprost  1 drop Both Eyes QHS  . predniSONE  40 mg Oral Q breakfast  . simvastatin  20 mg Oral QPM  . sodium chloride  3 mL Intravenous Q12H  . sodium chloride  3 mL Intravenous Q12H   Elmarie Shiley, MD 04/15/2014, 10:14 AM

## 2014-04-15 NOTE — Progress Notes (Signed)
    Subjective:  Still SOB but improved  Objective:  Vital Signs in the last 24 hours: Temp:  [97.9 F (36.6 C)-98.2 F (36.8 C)] 97.9 F (36.6 C) (01/05 0516) Pulse Rate:  [71-77] 71 (01/05 0516) Resp:  [18-20] 18 (01/05 0516) BP: (136-142)/(55-67) 142/64 mmHg (01/05 0516) SpO2:  [97 %-99 %] 97 % (01/05 0516) Weight:  [201 lb 4.8 oz (91.309 kg)-215 lb 9.8 oz (97.8 kg)] 201 lb 4.8 oz (91.309 kg) (01/05 0516)  Intake/Output from previous day:  Intake/Output Summary (Last 24 hours) at 04/15/14 0926 Last data filed at 04/15/14 0700  Gross per 24 hour  Intake    603 ml  Output   1700 ml  Net  -1097 ml    Physical Exam: General appearance: alert, cooperative, no distress and moderately obese HEENT-normal Lungs: decreased breath sounds bases Heart: regular rate and rhythm Abd: soft Ext: trace edema Neuro: grossly intact   Rate: 80  Rhythm: normal sinus rhythm and PVCs  Lab Results:  Recent Labs  04/14/14 0405 04/15/14 0408  WBC 10.4 10.3  HGB 8.3* 8.1*  PLT 271 278    Recent Labs  04/14/14 0405 04/15/14 0408  NA 141 141  K 4.0 4.2  CL 106 104  CO2 26 28  GLUCOSE 218* 145*  BUN 61* 70*  CREATININE 2.23* 2.34*    Imaging: Imaging results have been reviewed  Cardiac Studies:Echo 10/15 Study Conclusions  - Left ventricle: The cavity size was normal. Wall thickness was increased in a pattern of mild LVH. Systolic function was normal. The estimated ejection fraction was in the range of 50% to 55%. - Left atrium: The atrium was mildly dilated. - Pulmonary arteries: PA peak pressure: 32 mm Hg (S   Assessment/Plan:  79 year old female with history of hypertension, hyperlipidemia, diabetes, GERD, chronic kidney disease, and anemia, admitted 04/10/14 with dyspnea. Third admission in last few months for the same. Echo in Oct showed preserved LVF. She has presumed diastolic CHF not previously seen by cardiology. She has also been followed by pulmonary for  CAP. Previously with diuresis her CXR improved.     Principal Problem:   Diastolic CHF, acute on chronic Active Problems:   CKD (chronic kidney disease), stage III   HCAP (healthcare-associated pneumonia)   Diabetes mellitus   Essential hypertension   Gout   SOB (shortness of breath)   Obesity (BMI 37)   Bradycardia- beta blocker decreased   Pulmonary infiltrates   PLAN: 1 dyspnea-I previously spoke with patient and history seems most consistent with congestive heart failure as she has dyspnea on exertion and orthopnea. She denies fevers, chills or productive cough. With diuresis her renal function has deteriorated (and worse again today; pulmonary infiltrates persist despite diuresis). Will hold lasix. Plan to proceed with right heart catheterization to better understand filling pressures (? CHF or pulmonary process). Pulmonary is following and also feels this is more likely to be CHF. Check ESR. 2 chronic stage IV renal failure-DC diuretics for now until results of RHC known; nephrology following 3 hypertension-Blood pressure is controlled. Continue present medications. 4 mildly elevated troponin-this is not consistent with acute coronary syndrome. History is most likely consistent with congestive heart failure. Would ordinarily proceed with left heart catheterization as well. However she is at significant risk for contrast nephropathy. We will plan a nuclear study as an outpatient after she improves. Kirk Ruths

## 2014-04-15 NOTE — Progress Notes (Signed)
Duplicate, please see Dr. Jacalyn Lefevre note

## 2014-04-15 NOTE — H&P (View-Only) (Signed)
    Subjective:  Still SOB but improved  Objective:  Vital Signs in the last 24 hours: Temp:  [97.9 F (36.6 C)-98.2 F (36.8 C)] 97.9 F (36.6 C) (01/05 0516) Pulse Rate:  [71-77] 71 (01/05 0516) Resp:  [18-20] 18 (01/05 0516) BP: (136-142)/(55-67) 142/64 mmHg (01/05 0516) SpO2:  [97 %-99 %] 97 % (01/05 0516) Weight:  [201 lb 4.8 oz (91.309 kg)-215 lb 9.8 oz (97.8 kg)] 201 lb 4.8 oz (91.309 kg) (01/05 0516)  Intake/Output from previous day:  Intake/Output Summary (Last 24 hours) at 04/15/14 0926 Last data filed at 04/15/14 0700  Gross per 24 hour  Intake    603 ml  Output   1700 ml  Net  -1097 ml    Physical Exam: General appearance: alert, cooperative, no distress and moderately obese HEENT-normal Lungs: decreased breath sounds bases Heart: regular rate and rhythm Abd: soft Ext: trace edema Neuro: grossly intact   Rate: 80  Rhythm: normal sinus rhythm and PVCs  Lab Results:  Recent Labs  04/14/14 0405 04/15/14 0408  WBC 10.4 10.3  HGB 8.3* 8.1*  PLT 271 278    Recent Labs  04/14/14 0405 04/15/14 0408  NA 141 141  K 4.0 4.2  CL 106 104  CO2 26 28  GLUCOSE 218* 145*  BUN 61* 70*  CREATININE 2.23* 2.34*    Imaging: Imaging results have been reviewed  Cardiac Studies:Echo 10/15 Study Conclusions  - Left ventricle: The cavity size was normal. Wall thickness was increased in a pattern of mild LVH. Systolic function was normal. The estimated ejection fraction was in the range of 50% to 55%. - Left atrium: The atrium was mildly dilated. - Pulmonary arteries: PA peak pressure: 32 mm Hg (S   Assessment/Plan:  79 year old female with history of hypertension, hyperlipidemia, diabetes, GERD, chronic kidney disease, and anemia, admitted 04/10/14 with dyspnea. Third admission in last few months for the same. Echo in Oct showed preserved LVF. She has presumed diastolic CHF not previously seen by cardiology. She has also been followed by pulmonary for  CAP. Previously with diuresis her CXR improved.     Principal Problem:   Diastolic CHF, acute on chronic Active Problems:   CKD (chronic kidney disease), stage III   HCAP (healthcare-associated pneumonia)   Diabetes mellitus   Essential hypertension   Gout   SOB (shortness of breath)   Obesity (BMI 37)   Bradycardia- beta blocker decreased   Pulmonary infiltrates   PLAN: 1 dyspnea-I previously spoke with patient and history seems most consistent with congestive heart failure as she has dyspnea on exertion and orthopnea. She denies fevers, chills or productive cough. With diuresis her renal function has deteriorated (and worse again today; pulmonary infiltrates persist despite diuresis). Will hold lasix. Plan to proceed with right heart catheterization to better understand filling pressures (? CHF or pulmonary process). Pulmonary is following and also feels this is more likely to be CHF. Check ESR. 2 chronic stage IV renal failure-DC diuretics for now until results of RHC known; nephrology following 3 hypertension-Blood pressure is controlled. Continue present medications. 4 mildly elevated troponin-this is not consistent with acute coronary syndrome. History is most likely consistent with congestive heart failure. Would ordinarily proceed with left heart catheterization as well. However she is at significant risk for contrast nephropathy. We will plan a nuclear study as an outpatient after she improves. Kirk Ruths

## 2014-04-15 NOTE — Progress Notes (Addendum)
Name: Doris Howell MRN: [id number] DOB: [birth year]    ADMISSION DATE:  04/10/2014 CONSULTATION DATE:  04/13/13    REFERRING MD :  Dr. Algis Liming   CHIEF COMPLAINT:  SOB   BRIEF PATIENT DESCRIPTION:  79 y/o F admitted with acute respiratory failure secondary to bilateral pulmonary infiltrates.  Work up suggestive of pulmonary edema.  PCCM consulted for evaluation.    SIGNIFICANT EVENTS  12/31  Admit with 4-5 d hx worsening SOB   STUDIES:  01/01  CXR >> biapical airspace disease, bilateral pleural effusions concerning for CHF  01/05 CXR >>> persistent LUL and left perihilar infiltrates, new right perihilar and infrahilar infiltrates, L pleural effusion.  ? Worsening multifocal  PNA vs edema  SUBJECTIVE:   Reports dyspnea continues to improve though only slightly.  Denies any cough, fevers, chills, sweats.  Diuresed 1.7 L overnight.  Cards planning RHC today 01/05.  VITAL SIGNS: Temp:  [97.9 F (36.6 C)-98.2 F (36.8 C)] 97.9 F (36.6 C) (01/05 0516) Pulse Rate:  [71-77] 71 (01/05 0516) Resp:  [18-20] 18 (01/05 0516) BP: (136-142)/(55-67) 142/64 mmHg (01/05 0516) SpO2:  [97 %-99 %] 97 % (01/05 0516) Weight:  [91.309 kg (201 lb 4.8 oz)-97.8 kg (215 lb 9.8 oz)] 91.309 kg (201 lb 4.8 oz) (01/05 0516)  PHYSICAL EXAMINATION: General:  WDWN female, in NAD. Neuro:  AAOx4, speech clear, MAE  HEENT:  Mm pink/moist, no JVD Cardiovascular:  RRR, no M/R/G. Lungs:  resp's even/non-labored, CTA bilaterally. Abdomen:  BS x 4, round, soft, NT/ND. Musculoskeletal:  No acute deformities  Skin:  Warm/dry, no edema    Recent Labs Lab 04/13/14 0400 04/14/14 0405 04/15/14 0408  NA 138 141 141  K 4.1 4.0 4.2  CL 103 106 104  CO2 '27 26 28  ' BUN 51* 61* 70*  CREATININE 2.28* 2.23* 2.34*  GLUCOSE 219* 218* 145*    Recent Labs Lab 04/11/14 0558 04/14/14 0405 04/15/14 0408  HGB 8.9* 8.3* 8.1*  HCT 27.4* 26.3* 25.7*  WBC 7.0 10.4 10.3  PLT 278 271 278   Dg Chest 2  View  04/13/2014   CLINICAL DATA:  Shortness of breath, abnormal chest x-ray.  EXAM: CHEST  2 VIEW  COMPARISON:  February 10, 2015.  FINDINGS: Stable cardiomediastinal silhouette. Right upper lobe opacity noted on prior exam is improved although residual density remains. Left upper lobe and perihilar opacity is unchanged concerning for pneumonia. No pneumothorax or significant pleural effusion is noted. Bony thorax is intact.  IMPRESSION: Persistent left upper lobe and perihilar opacity is noted most consistent with pneumonia. Significantly improved right upper lobe pneumonia compared to prior exam.   Electronically Signed   By: Sabino Dick M.D.   On: 04/13/2014 18:38   Dg Chest Port 1 View  04/15/2014   CLINICAL DATA:  Shortness of breath, pulmonary infiltrates  EXAM: PORTABLE CHEST - 1 VIEW  COMPARISON:  04/13/2014  FINDINGS: Cardiomediastinal silhouette is stable. Persistent left upper lobe and left perihilar infiltrates. New infiltrates right perihilar and right infrahilar region. Question small left pleural effusion. Findings suspicious for worsening multifocal pneumonia rather than pulmonary edema.  IMPRESSION: Persistent left upper lobe and left perihilar infiltrates. New infiltrates right perihilar and right infrahilar region. Question small left pleural effusion. Findings suspicious for worsening multifocal pneumonia rather than pulmonary edema.   Electronically Signed   By: Lahoma Crocker M.D.   On: 04/15/2014 08:08    ASSESSMENT / PLAN:  Acute hypoxic respiratory failure with recurrent b/l pulmonary infiltrates ?  PNA (less likely) vs heart failure vs inflammatory process. Plan: Diuresis on hold for now given bump in SCr. RHC today per cardiology. F/u ESR. Monitor off Abx since no clinical evidence for infection (still to indication of infection). Pending RHC results, if not CHF then may need to pursue non contrast chest CT to further evaluate infiltrates Continue prednisone 40 mg daily for now >>  likely can start to wean off soon  Hx of HTN. Plan: Per primary team   Montey Hora, [place] Pgr: [phone]  or [phone] 04/15/2014, 9:52 AM  Reviewed above, examined pt.  She denies cough, chest pain.  Better air movement.  CXR with improvement compared to 1/01, but still has infiltrates.  For heart cath later today >> depending on results will determine if additional pulmonary eval needed.  Continue prednisone for now, but likely d/c soon.  Chesley Mires, MD [facility] 04/15/2014, 12:09 PM Pager:  [phone] After 3pm call: [phone]

## 2014-04-15 NOTE — Interval H&P Note (Signed)
History and Physical Interval Note:  04/15/2014 3:45 PM  Doris Howell  has presented today for surgery, with the diagnosis of heart failure  The various methods of treatment have been discussed with the patient and family. After consideration of risks, benefits and other options for treatment, the patient has consented to  Procedure(s): RIGHT HEART CATH (N/A) as a surgical intervention .  The patient's history has been reviewed, patient examined, no change in status, stable for surgery.  I have reviewed the patient's chart and labs.  Questions were answered to the patient's satisfaction.     Hagen Tidd

## 2014-04-16 ENCOUNTER — Encounter (HOSPITAL_COMMUNITY): Payer: Self-pay | Admitting: Cardiovascular Disease

## 2014-04-16 DIAGNOSIS — R0602 Shortness of breath: Secondary | ICD-10-CM

## 2014-04-16 LAB — BASIC METABOLIC PANEL
Anion gap: 12 (ref 5–15)
BUN: 74 mg/dL — ABNORMAL HIGH (ref 6–23)
CO2: 24 mmol/L (ref 19–32)
Calcium: 9.1 mg/dL (ref 8.4–10.5)
Chloride: 104 mEq/L (ref 96–112)
Creatinine, Ser: 2.34 mg/dL — ABNORMAL HIGH (ref 0.50–1.10)
GFR calc Af Amer: 22 mL/min — ABNORMAL LOW (ref >90)
GFR calc non Af Amer: 19 mL/min — ABNORMAL LOW (ref >90)
Glucose, Bld: 225 mg/dL — ABNORMAL HIGH (ref 70–99)
Potassium: 4.1 mmol/L (ref 3.5–5.1)
Sodium: 140 mmol/L (ref 135–145)

## 2014-04-16 LAB — CBC
HCT: 27.1 % — ABNORMAL LOW (ref 36.0–46.0)
Hemoglobin: 8.7 g/dL — ABNORMAL LOW (ref 12.0–15.0)
MCH: 29.4 pg (ref 26.0–34.0)
MCHC: 32.1 g/dL (ref 30.0–36.0)
MCV: 91.6 fL (ref 78.0–100.0)
Platelets: 273 10*3/uL (ref 150–400)
RBC: 2.96 MIL/uL — ABNORMAL LOW (ref 3.87–5.11)
RDW: 15 % (ref 11.5–15.5)
WBC: 9.8 10*3/uL (ref 4.0–10.5)

## 2014-04-16 LAB — IRON AND TIBC
Iron: 107 ug/dL (ref 42–145)
Saturation Ratios: 34 % (ref 20–55)
TIBC: 312 ug/dL (ref 250–470)
UIBC: 205 ug/dL (ref 125–400)

## 2014-04-16 LAB — GLUCOSE, CAPILLARY
Glucose-Capillary: 159 mg/dL — ABNORMAL HIGH (ref 70–99)
Glucose-Capillary: 206 mg/dL — ABNORMAL HIGH (ref 70–99)
Glucose-Capillary: 302 mg/dL — ABNORMAL HIGH (ref 70–99)
Glucose-Capillary: 172 mg/dL — ABNORMAL HIGH (ref 70–99)

## 2014-04-16 LAB — MPO/PR-3 (ANCA) ANTIBODIES
Myeloperoxidase Abs: <1
Serine Protease 3: <1

## 2014-04-16 LAB — PARATHYROID HORMONE, INTACT (NO CA): PTH: 114 pg/mL — ABNORMAL HIGH (ref 14–64)

## 2014-04-16 LAB — ANA: Anti Nuclear Antibody(ANA): NEGATIVE

## 2014-04-16 LAB — FERRITIN: Ferritin: 236 ng/mL (ref 10–291)

## 2014-04-16 MED ORDER — FUROSEMIDE 10 MG/ML IJ SOLN
40.0000 mg | Freq: Two times a day (BID) | INTRAMUSCULAR | Status: DC
  Administered 2014-04-16 – 2014-04-17 (×4): 40 mg via INTRAVENOUS
  Filled 2014-04-16 (×6): qty 4

## 2014-04-16 MED ORDER — DOCUSATE SODIUM 100 MG PO CAPS
100.0000 mg | ORAL_CAPSULE | Freq: Two times a day (BID) | ORAL | Status: DC
  Administered 2014-04-16 – 2014-04-19 (×7): 100 mg via ORAL
  Filled 2014-04-16 (×8): qty 1

## 2014-04-16 MED ORDER — BISACODYL 10 MG RE SUPP
10.0000 mg | Freq: Once | RECTAL | Status: AC
  Administered 2014-04-16: 10 mg via RECTAL
  Filled 2014-04-16: qty 1

## 2014-04-16 MED ORDER — PREDNISONE 20 MG PO TABS
30.0000 mg | ORAL_TABLET | Freq: Every day | ORAL | Status: DC
  Filled 2014-04-16: qty 1

## 2014-04-16 MED ORDER — FLEET ENEMA 7-19 GM/118ML RE ENEM
1.0000 | ENEMA | Freq: Once | RECTAL | Status: AC
  Administered 2014-04-16: 1 via RECTAL
  Filled 2014-04-16: qty 1

## 2014-04-16 NOTE — Progress Notes (Signed)
PROGRESS NOTE    Doris Howell [id number] DOB: February 23, 1936 DOA: 04/10/2014 PCP: Irven Shelling, MD  HPI/Brief narrative 79 year old female patient with history of HTN, HLD, DM 2, GERD, gout, CKD, anemia, OSA, presented with acute/subacute onset of dyspnea and hypoxia. Lifelong nonsmoker but exposed to secondhand smoke exposure spouse. This is her third admission in the last 3 months for similar presentation. Assessed to have acute on chronic diastolic CHF. Improved with diuretics. Cardiology felt that her presentation was more in keeping with a pulmonary etiology. Pulmonary consulted on 04/13/14 and felt all her presentation was primarily cardiac including previous admission.   Assessment/Plan:  1. Acute respiratory failure with hypoxia: Most likely secondary to acute diastolic CHF. She was diuresed with IV Lasix and has clinically improved. VQ scan during recent admission was negative. Continues to have some shortness of breath 2. Acute on chronic diastolic CHF: Patient on Lasix 40 mg IV twice a day, kidney function stable with little change from yesterday. Creatinine at 2.34. Cardiology following.        She underwent a right heart catheterization performed on 04/15/2014 which revealed biventricular heart failure with preserved        ejection fraction, moderate pulmonary artery hypertension.  3. Bradycardia: Coreg was reduced to 6.25 MG twice a day. Bradycardia resolved. 4. Mildly elevated troponin: Likely secondary to demand ischemia from acute respiratory failure and acute diastolic CHF. As per cardiology, outpatient stress test since LHC carries high risk of contrast nephropathy and pushing her towards HD. 5. Essential hypertension: Continue amlodipine and carvedilol. 6. Hyperlipidemia: Continue simvastatin 7. Uncontrolled DM 2: Continue NovoLog SSI. Worsened by steroids-reducing steroids. Oral hypoglycemics on hold. Fluctuating CBGs-may need insulin adjustment. 8. Stage 4  chronic kidney disease: Patient with history of solitary kidney. Status post right heart catheterization performed on 04/15/2014. Labs showing a stable creatinine. Continue IV diuresis follow-up on a.m. labs to monitor kidney function. 9. Chronic anemia: Likely secondary to chronic kidney disease. A.m. labs showing hemoglobin of 8.7 10. Hypokalemia: Replaced 11. Suspected OHS/OSA: Outpatient workup with sleep study   Code Status: Full Family Communication: None at bedside Disposition Plan: Not medically stable for discharge   Consultants:  Cardiology  Pulmonology  Nephrology  Procedures:  None  Antibiotics:  IV levofloxacin -DC'd  Subjective: Patient states feeling better although continues to have nonexertional dyspnea  Objective: Filed Vitals:   04/15/14 1939 04/16/14 0557 04/16/14 1246 04/16/14 1613  BP: 148/60 129/48 151/64 153/65  Pulse: 68 70 68 69  Temp: 98 F (36.7 C) 98.4 F (36.9 C)    TempSrc: Oral Oral    Resp: 18 18    Height:      Weight:  92.2 kg (203 lb 4.2 oz)    SpO2: 100% 100%      Intake/Output Summary (Last 24 hours) at 04/16/14 1723 Last data filed at 04/16/14 1613  Gross per 24 hour  Intake   1182 ml  Output   1650 ml  Net   -468 ml   Filed Weights   04/14/14 2347 04/15/14 0516 04/16/14 0557  Weight: 97.8 kg (215 lb 9.8 oz) 91.309 kg (201 lb 4.8 oz) 92.2 kg (203 lb 4.2 oz)     Exam:  General exam: Pleasant elderly female lying comfortably propped up in bed. Respiratory system: Diminished breath sounds in the bases. Rest of lung fields clear to auscultation. No increased work of breathing. No wheezing or rhonchi. Cardiovascular system: S1 & S2 heard, RRR. No JVD, murmurs, gallops, clicks  or pedal edema. Telemetry: Sinus rhythm. Gastrointestinal system: Abdomen is nondistended, soft and nontender. Normal bowel sounds heard. Central nervous system: Alert and oriented. No focal neurological deficits. Extremities: Symmetric 5 x 5  power.   Data Reviewed: Basic Metabolic Panel:  Recent Labs Lab 04/11/14 0558 04/13/14 0400 04/14/14 0405 04/15/14 0408 04/16/14 0330  NA 141 138 141 141 140  K 3.3* 4.1 4.0 4.2 4.1  CL 104 103 106 104 104  CO2 25 27 26 28 24   GLUCOSE 131* 219* 218* 145* 225*  BUN 39* 51* 61* 70* 74*  CREATININE 2.05* 2.28* 2.23* 2.34* 2.34*  CALCIUM 9.3 9.3 9.1 8.9 9.1  PHOS  --   --   --  4.7*  --    Liver Function Tests:  Recent Labs Lab 04/11/14 0558 04/15/14 0408  AST 26  --   ALT 18  --   ALKPHOS 86  --   BILITOT 0.5  --   PROT 7.3  --   ALBUMIN 3.2* 2.8*   No results for input(s): LIPASE, AMYLASE in the last 168 hours. No results for input(s): AMMONIA in the last 168 hours. CBC:  Recent Labs Lab 04/10/14 2350 04/11/14 0558 04/14/14 0405 04/15/14 0408 04/16/14 0330  WBC 8.9 7.0 10.4 10.3 9.8  HGB 9.3* 8.9* 8.3* 8.1* 8.7*  HCT 29.0* 27.4* 26.3* 25.7* 27.1*  MCV 87.9 90.7 89.5 89.9 91.6  PLT 279 278 271 278 273   Cardiac Enzymes:  Recent Labs Lab 04/11/14 0558  TROPONINI 0.06*   BNP (last 3 results)  Recent Labs  01/09/14 1515 02/05/14 1216  PROBNP 2595.0* 2767.0*   CBG:  Recent Labs Lab 04/15/14 1112 04/15/14 1817 04/15/14 2139 04/16/14 0556 04/16/14 1149  GLUCAP 145* 238* 330* 159* 206*    No results found for this or any previous visit (from the past 240 hour(s)).       Studies: Dg Chest Port 1 View  04/15/2014   CLINICAL DATA:  Shortness of breath, pulmonary infiltrates  EXAM: PORTABLE CHEST - 1 VIEW  COMPARISON:  04/13/2014  FINDINGS: Cardiomediastinal silhouette is stable. Persistent left upper lobe and left perihilar infiltrates. New infiltrates right perihilar and right infrahilar region. Question small left pleural effusion. Findings suspicious for worsening multifocal pneumonia rather than pulmonary edema.  IMPRESSION: Persistent left upper lobe and left perihilar infiltrates. New infiltrates right perihilar and right infrahilar  region. Question small left pleural effusion. Findings suspicious for worsening multifocal pneumonia rather than pulmonary edema.   Electronically Signed   By: Lahoma Crocker M.D.   On: 04/15/2014 08:08        Scheduled Meds: . allopurinol  100 mg Oral BID  . amLODipine  10 mg Oral q morning - 10a  . aspirin EC  81 mg Oral Daily  . carvedilol  6.25 mg Oral BID WC  . docusate sodium  100 mg Oral BID  . furosemide  40 mg Intravenous BID  . heparin  5,000 Units Subcutaneous 3 times per day  . insulin aspart  0-15 Units Subcutaneous TID WC  . insulin aspart  0-5 Units Subcutaneous QHS  . latanoprost  1 drop Both Eyes QHS  . simvastatin  20 mg Oral QPM  . sodium chloride  3 mL Intravenous Q12H  . sodium phosphate  1 enema Rectal Once   Continuous Infusions:    Principal Problem:   Diastolic CHF, acute on chronic Active Problems:   CKD (chronic kidney disease), stage III   HCAP (healthcare-associated pneumonia)  Diabetes mellitus   Essential hypertension   Gout   SOB (shortness of breath)   Obesity (BMI 37)   Bradycardia- beta blocker decreased   Pulmonary infiltrates    Time spent: 30 minutes    Kelvin Cellar, MD, FACP, FHM. [facility] Pager [phone]  If 7PM-7AM, please contact night-coverage [email or web address] Password [email or web address] 04/16/2014, 5:23 PM    LOS: 6 days

## 2014-04-16 NOTE — Progress Notes (Signed)
    Subjective:  Still SOB but improved; no chest pain  Objective:  Vital Signs in the last 24 hours: Temp:  [98 F (36.7 C)-98.4 F (36.9 C)] 98.4 F (36.9 C) (01/06 0557) Pulse Rate:  [66-79] 70 (01/06 0557) Resp:  [18] 18 (01/06 0557) BP: (120-148)/(48-83) 129/48 mmHg (01/06 0557) SpO2:  [99 %-100 %] 100 % (01/06 0557) Weight:  [203 lb 4.2 oz (92.2 kg)] 203 lb 4.2 oz (92.2 kg) (01/06 0557)  Intake/Output from previous day:  Intake/Output Summary (Last 24 hours) at 04/16/14 0741 Last data filed at 04/16/14 0644  Gross per 24 hour  Intake    720 ml  Output   1650 ml  Net   -930 ml    Physical Exam: General appearance: alert, cooperative, no distress and moderately obese HEENT-normal Lungs: decreased breath sounds bases Heart: regular rate and rhythm Abd: soft; right groin with no hematoma Ext: trace edema Neuro: grossly intact   Rhythm: normal sinus rhythm and PVCs  Lab Results:  Recent Labs  04/15/14 0408 04/16/14 0330  WBC 10.3 9.8  HGB 8.1* 8.7*  PLT 278 273    Recent Labs  04/15/14 0408 04/16/14 0330  NA 141 140  K 4.2 4.1  CL 104 104  CO2 28 24  GLUCOSE 145* 225*  BUN 70* 74*  CREATININE 2.34* 2.34*     Cardiac Studies:Echo 10/15 Study Conclusions  - Left ventricle: The cavity size was normal. Wall thickness was increased in a pattern of mild LVH. Systolic function was normal. The estimated ejection fraction was in the range of 50% to 55%. - Left atrium: The atrium was mildly dilated. - Pulmonary arteries: PA peak pressure: 32 mm Hg (S   Assessment/Plan:  79 year old female with history of hypertension, hyperlipidemia, diabetes, GERD, chronic kidney disease, and anemia, admitted 04/10/14 with dyspnea. Third admission in last few months for the same. Echo in Oct showed preserved LVF. Right heart cath 04/15/14 with PCWP 26.    Principal Problem:   Diastolic CHF, acute on chronic Active Problems:   CKD (chronic kidney disease),  stage III   HCAP (healthcare-associated pneumonia)   Diabetes mellitus   Essential hypertension   Gout   SOB (shortness of breath)   Obesity (BMI 37)   Bradycardia- beta blocker decreased   Pulmonary infiltrates   PLAN: 1 acute on chronic diastolic CHF-RHC with PA wedge pressure 26 most consistent with diastolic CHF; I reviewed previous echo 10/15 and LV function appears to be low normal; RV function also appears to be normal; grade 2 diastolic dysfunction; resume lasix 40 mg IV BID; follow renal function.  2 chronic stage IV renal failure-Resume lasix and follow renal function; may ultimately require dialysis; nephrology following 3 hypertension-Blood pressure is controlled. Continue present medications. 4 mildly elevated troponin-this is not consistent with acute coronary syndrome. History is most likely consistent with congestive heart failure. Would ordinarily proceed with left heart catheterization. However she is at significant risk for contrast nephropathy. We will plan a nuclear study as an outpatient after she improves. Kirk Ruths

## 2014-04-16 NOTE — Progress Notes (Signed)
Patient ID: Doris Howell, female   DOB: January 22, 1936, 79 y.o.   MRN: [id number]  [facility] Progress Note    Assessment/ Plan:   1. Chronic kidney disease stage IV: Solitary kidney with hypertensive nephrosclerosis-possible element of diabetic kidney disease. Currently with acute on chronic renal failure in the face of what appears to be diastolic heart failure. Attempting diuretic therapy cautiously with close monitoring for worsening renal function. 2. Acute hypoxic respiratory failure: Appears to be bi-factorial from CHF exacerbation/possible pulmonary process-on diuretic therapy (currently on hold) and prednisone. 3. Hypertension: Fairly controlled on current management with amlodipine and carvedilol. 4. Screening for metabolic bone disease: Phosphorus levels acceptable-check intact PTH. 5. Screening for anemia of chronic kidney disease: Iron stores will be checked and replaced prior to starting ESA therapy.  Subjective:   Right heart catheterization done yesterday-elevated pulmonary capillary wedge pressures suggestive of diastolic heart failure/volume overload-undertaking diuretic therapy    Objective:   BP 129/48 mmHg  Pulse 70  Temp(Src) 98.4 F (36.9 C) (Oral)  Resp 18  Ht 5\' 1"  (1.549 m)  Wt 92.2 kg (203 lb 4.2 oz)  BMI 38.43 kg/m2  SpO2 100%  Intake/Output Summary (Last 24 hours) at 04/16/14 0959 Last data filed at 04/16/14 [id number]  Gross per 24 hour  Intake    720 ml  Output   1650 ml  Net   -930 ml   Weight change: -5.6 kg (-12 lb 5.5 oz)  Physical Exam: Gen: Comfortably resting in bed-needs help with breakfast CVS: Pulse regular rate and rhythm Resp: Clear to auscultation, no distinct rales/rhonchi Abd: Soft, obese, nontender Ext: No lower extremity edema  Imaging: Dg Chest Port 1 View  04/15/2014   CLINICAL DATA:  Shortness of breath, pulmonary infiltrates  EXAM: PORTABLE CHEST - 1 VIEW  COMPARISON:  04/13/2014  FINDINGS: Cardiomediastinal  silhouette is stable. Persistent left upper lobe and left perihilar infiltrates. New infiltrates right perihilar and right infrahilar region. Question small left pleural effusion. Findings suspicious for worsening multifocal pneumonia rather than pulmonary edema.  IMPRESSION: Persistent left upper lobe and left perihilar infiltrates. New infiltrates right perihilar and right infrahilar region. Question small left pleural effusion. Findings suspicious for worsening multifocal pneumonia rather than pulmonary edema.   Electronically Signed   By: Lahoma Crocker M.D.   On: 04/15/2014 08:08    Labs: BMET  Recent Labs Lab 04/10/14 2350 04/11/14 0558 04/13/14 0400 04/14/14 0405 04/15/14 0408 04/16/14 0330  NA 139 141 138 141 141 140  K 3.2* 3.3* 4.1 4.0 4.2 4.1  CL 103 104 103 106 104 104  CO2 26 25 27 26 28 24   GLUCOSE 187* 131* 219* 218* 145* 225*  BUN 40* 39* 51* 61* 70* 74*  CREATININE 2.12* 2.05* 2.28* 2.23* 2.34* 2.34*  CALCIUM 9.7 9.3 9.3 9.1 8.9 9.1  PHOS  --   --   --   --  4.7*  --    CBC  Recent Labs Lab 04/11/14 0558 04/14/14 0405 04/15/14 0408 04/16/14 0330  WBC 7.0 10.4 10.3 9.8  HGB 8.9* 8.3* 8.1* 8.7*  HCT 27.4* 26.3* 25.7* 27.1*  MCV 90.7 89.5 89.9 91.6  PLT 278 271 278 273    Medications:    . allopurinol  100 mg Oral BID  . amLODipine  10 mg Oral q morning - 10a  . aspirin EC  81 mg Oral Daily  . bisacodyl  10 mg Rectal Once  . carvedilol  6.25 mg Oral BID WC  .  docusate sodium  100 mg Oral BID  . furosemide  40 mg Intravenous BID  . heparin  5,000 Units Subcutaneous 3 times per day  . insulin aspart  0-15 Units Subcutaneous TID WC  . insulin aspart  0-5 Units Subcutaneous QHS  . latanoprost  1 drop Both Eyes QHS  . [START ON 04/17/2014] predniSONE  30 mg Oral Q breakfast  . simvastatin  20 mg Oral QPM  . sodium chloride  3 mL Intravenous Q12H   Elmarie Shiley, MD 04/16/2014, 9:59 AM

## 2014-04-16 NOTE — Progress Notes (Signed)
Name: Doris Howell MRN: [id number] DOB: 1936-03-31    ADMISSION DATE:  04/10/2014 CONSULTATION DATE:  04/13/13    REFERRING MD :  Dr. Algis Liming   CHIEF COMPLAINT:  SOB   BRIEF PATIENT DESCRIPTION:  79 y/o F admitted with acute respiratory failure secondary to bilateral pulmonary infiltrates.  Work up suggestive of pulmonary edema.  PCCM consulted for evaluation.    SIGNIFICANT EVENTS  12/31  Admit with 4-5 d hx worsening SOB   STUDIES:  01/01  CXR >> biapical airspace disease, bilateral pleural effusions concerning for CHF  01/05 CXR >>> persistent LUL and left perihilar infiltrates, new right perihilar and infrahilar infiltrates, L pleural effusion.  ? Worsening multifocal  PNA vs edema 01/05  RHC consistent with biventricular  failure  SUBJECTIVE:   Reports dyspnea continues to improve though only slightly.  Denies any cough, fevers, chills, sweats.  Diuresed 1.7 L overnight.  Cards planning RHC today 01/05.  VITAL SIGNS: Temp:  [98 F (36.7 C)-98.4 F (36.9 C)] 98.4 F (36.9 C) (01/06 0557) Pulse Rate:  [66-79] 70 (01/06 0557) Resp:  [18] 18 (01/06 0557) BP: (120-148)/(48-83) 129/48 mmHg (01/06 0557) SpO2:  [99 %-100 %] 100 % (01/06 0557) Weight:  [203 lb 4.2 oz (92.2 kg)] 203 lb 4.2 oz (92.2 kg) (01/06 0557)  PHYSICAL EXAMINATION: General:  WDWN female, in NAD. On O2 with sats 100% Neuro:  AAOx4, speech clear, MAE  HEENT:  Mm pink/moist, no JVD Cardiovascular:  RRR, no M/R/G. Lungs:  resp's even/non-labored, CTA bilaterally. Diminished in bases Abdomen:  BS x 4, round, soft, NT/ND obese Musculoskeletal:  No acute deformities  Skin:  Warm/dry, no edema    Recent Labs Lab 04/14/14 0405 04/15/14 0408 04/16/14 0330  NA 141 141 140  K 4.0 4.2 4.1  CL 106 104 104  CO2 26 28 24   BUN 61* 70* 74*  CREATININE 2.23* 2.34* 2.34*  GLUCOSE 218* 145* 225*    Recent Labs Lab 04/14/14 0405 04/15/14 0408 04/16/14 0330  HGB 8.3* 8.1* 8.7*  HCT 26.3* 25.7*  27.1*  WBC 10.4 10.3 9.8  PLT 271 278 273   Dg Chest Port 1 View  04/15/2014   CLINICAL DATA:  Shortness of breath, pulmonary infiltrates  EXAM: PORTABLE CHEST - 1 VIEW  COMPARISON:  04/13/2014  FINDINGS: Cardiomediastinal silhouette is stable. Persistent left upper lobe and left perihilar infiltrates. New infiltrates right perihilar and right infrahilar region. Question small left pleural effusion. Findings suspicious for worsening multifocal pneumonia rather than pulmonary edema.  IMPRESSION: Persistent left upper lobe and left perihilar infiltrates. New infiltrates right perihilar and right infrahilar region. Question small left pleural effusion. Findings suspicious for worsening multifocal pneumonia rather than pulmonary edema.   Electronically Signed   By: Lahoma Crocker M.D.   On: 04/15/2014 08:08    ASSESSMENT / PLAN:  Acute hypoxic respiratory failure with recurrent b/l pulmonary infiltrates most likely related to heart failure. Plan: Monitor off Abx D/c prednisone F/u CXR as needed  Oxygen to keep SpO2 > 92% Might need outpt evaluation for OSA Diuresis per cardiology and nephrology  Acute on chronic diastolic CHF. Hx of HTN. Plan: Per cardiology/primary team  Stage IV CKD. Plan: Renal consulted  [facility] ACNP Maryanna Shape PCCM Pager [phone] till 3 pm If no answer page [phone] 04/16/2014, 9:20 AM  Reviewed above, examined.  Breathing better, but still short of breath.  Faint rales.  Will d/c prednisone.  Diuresis per cards/renal.  PCCM will sign off.  Please call if additional help needed.  Chesley Mires, MD [facility] Pulmonary/Critical Care 04/16/2014, 2:43 PM Pager:  [phone] After 3pm call: [phone]

## 2014-04-16 NOTE — Progress Notes (Signed)
Physical Therapy Treatment Patient Details Name: Doris Howell MRN: [id number] DOB: March 11, 1936 Today's Date: 05-06-14    History of Present Illness Pt adm with SOB and found to acute on chronic heart failure. PMH- DM, CKD, HTN, gout    PT Comments    Pt making steady progress.  Follow Up Recommendations  No PT follow up     Equipment Recommendations  None recommended by PT    Recommendations for Other Services       Precautions / Restrictions Precautions Precautions: None    Mobility  Bed Mobility               General bed mobility comments: Pt sitting EOB.  Transfers Overall transfer level: Needs assistance Equipment used: None Transfers: Sit to/from Stand Sit to Stand: Supervision            Ambulation/Gait Ambulation/Gait assistance: Supervision;Min guard Ambulation Distance (Feet): 200 Feet Assistive device: Rolling walker (2 wheeled);1 person hand held assist Gait Pattern/deviations: Step-through pattern;Decreased stride length     General Gait Details: Slightly unsteady when not using rolling walker. Pt steadier with rolling walker. Dyspnea 2/4 on 2L.   Stairs            Wheelchair Mobility    Modified Rankin (Stroke Patients Only)       Balance Overall balance assessment: Needs assistance Sitting-balance support: No upper extremity supported;Feet supported Sitting balance-Leahy Scale: Good     Standing balance support: No upper extremity supported Standing balance-Leahy Scale: Good                      Cognition Arousal/Alertness: Awake/alert Behavior During Therapy: WFL for tasks assessed/performed Overall Cognitive Status: Within Functional Limits for tasks assessed                      Exercises      General Comments        Pertinent Vitals/Pain Pain Assessment: No/denies pain    Home Living                      Prior Function            PT Goals (current goals can now be  found in the care plan section) Progress towards PT goals: Progressing toward goals    Frequency  Min 3X/week    PT Plan Current plan remains appropriate    Co-evaluation             End of Session Equipment Utilized During Treatment: Oxygen Activity Tolerance: Patient tolerated treatment well Patient left: in bed;with call bell/phone within reach;with family/visitor present     Time: 1152-1203 PT Time Calculation (min) (ACUTE ONLY): 11 min  Charges:  $Gait Training: 8-22 mins                    G Codes:      Clancey Welton 05-06-14, 12:43 PM  [employer] PT [phone]

## 2014-04-17 LAB — BASIC METABOLIC PANEL
Anion gap: 6 (ref 5–15)
BUN: 69 mg/dL — ABNORMAL HIGH (ref 6–23)
CO2: 32 mmol/L (ref 19–32)
Calcium: 8.7 mg/dL (ref 8.4–10.5)
Chloride: 104 mEq/L (ref 96–112)
Creatinine, Ser: 2.06 mg/dL — ABNORMAL HIGH (ref 0.50–1.10)
GFR calc Af Amer: 26 mL/min — ABNORMAL LOW (ref >90)
GFR calc non Af Amer: 22 mL/min — ABNORMAL LOW (ref >90)
Glucose, Bld: 155 mg/dL — ABNORMAL HIGH (ref 70–99)
Potassium: 4 mmol/L (ref 3.5–5.1)
Sodium: 142 mmol/L (ref 135–145)

## 2014-04-17 LAB — GLUCOSE, CAPILLARY
Glucose-Capillary: 134 mg/dL — ABNORMAL HIGH (ref 70–99)
Glucose-Capillary: 206 mg/dL — ABNORMAL HIGH (ref 70–99)
Glucose-Capillary: 149 mg/dL — ABNORMAL HIGH (ref 70–99)
Glucose-Capillary: 156 mg/dL — ABNORMAL HIGH (ref 70–99)

## 2014-04-17 LAB — CBC
HCT: 25.7 % — ABNORMAL LOW (ref 36.0–46.0)
Hemoglobin: 8.3 g/dL — ABNORMAL LOW (ref 12.0–15.0)
MCH: 29 pg (ref 26.0–34.0)
MCHC: 32.3 g/dL (ref 30.0–36.0)
MCV: 89.9 fL (ref 78.0–100.0)
Platelets: 260 10*3/uL (ref 150–400)
RBC: 2.86 MIL/uL — ABNORMAL LOW (ref 3.87–5.11)
RDW: 14.7 % (ref 11.5–15.5)
WBC: 10.2 10*3/uL (ref 4.0–10.5)

## 2014-04-17 MED ORDER — INSULIN ASPART 100 UNIT/ML ~~LOC~~ SOLN
4.0000 [IU] | Freq: Three times a day (TID) | SUBCUTANEOUS | Status: DC
  Administered 2014-04-17 – 2014-04-19 (×4): 4 [IU] via SUBCUTANEOUS

## 2014-04-17 NOTE — Progress Notes (Signed)
Patient Name: Doris Howell Date of Encounter: 04/17/2014     Principal Problem:   Diastolic CHF, acute on chronic Active Problems:   CKD (chronic kidney disease), stage III   HCAP (healthcare-associated pneumonia)   Diabetes mellitus   Essential hypertension   Gout   SOB (shortness of breath)   Obesity (BMI 37)   Bradycardia- beta blocker decreased   Pulmonary infiltrates    SUBJECTIVE  Weak, breathing OK. Walked by Dr Coralyn Pear this morning off of O2, O2 sat 100%.   CURRENT MEDS . allopurinol  100 mg Oral BID  . amLODipine  10 mg Oral q morning - 10a  . aspirin EC  81 mg Oral Daily  . carvedilol  6.25 mg Oral BID WC  . docusate sodium  100 mg Oral BID  . furosemide  40 mg Intravenous BID  . heparin  5,000 Units Subcutaneous 3 times per day  . insulin aspart  0-15 Units Subcutaneous TID WC  . insulin aspart  0-5 Units Subcutaneous QHS  . latanoprost  1 drop Both Eyes QHS  . simvastatin  20 mg Oral QPM  . sodium chloride  3 mL Intravenous Q12H    OBJECTIVE  Filed Vitals:   04/16/14 1246 04/16/14 1613 04/16/14 2058 04/17/14 0555  BP: 151/64 153/65 169/62 131/49  Pulse: 68 69 68 66  Temp:   98 F (36.7 C) 98.2 F (36.8 C)  TempSrc:   Oral Oral  Resp:   18 18  Height:      Weight:    200 lb 6.4 oz (90.901 kg)  SpO2:   100% 100%    Intake/Output Summary (Last 24 hours) at 04/17/14 0948 Last data filed at 04/17/14 0646  Gross per 24 hour  Intake   1205 ml  Output   3451 ml  Net  -2246 ml   Filed Weights   04/15/14 0516 04/16/14 0557 04/17/14 0555  Weight: 201 lb 4.8 oz (91.309 kg) 203 lb 4.2 oz (92.2 kg) 200 lb 6.4 oz (90.901 kg)    PHYSICAL EXAM  General: Pleasant, NAD. Neuro: Alert and oriented X 3. Moves all extremities spontaneously. Psych: Normal affect. HEENT:  Normal  Neck: Supple without bruits. Mild JVD on L, no obvious JVD on R. Lungs:  Resp regular and unlabored, CTA, no obvious rale on exam  Heart: RRR no s3, s4, or  murmurs. Abdomen: Soft, non-tender, non-distended, BS + x 4.  Extremities: No clubbing, cyanosis or edema. DP/PT/Radials 2+ and equal bilaterally.  Accessory Clinical Findings  CBC  Recent Labs  04/16/14 0330 04/17/14 0548  WBC 9.8 10.2  HGB 8.7* 8.3*  HCT 27.1* 25.7*  MCV 91.6 89.9  PLT 273 [id number]   Basic Metabolic Panel  Recent Labs  04/15/14 0408 04/16/14 0330 04/17/14 0548  NA 141 140 142  K 4.2 4.1 4.0  CL 104 104 104  CO2 28 24 32  GLUCOSE 145* 225* 155*  BUN 70* 74* 69*  CREATININE 2.34* 2.34* 2.06*  CALCIUM 8.9 9.1 8.7  PHOS 4.7*  --   --    Liver Function Tests  Recent Labs  04/15/14 0408  ALBUMIN 2.8*    TELE Off telemetry    ECG  No new EKG  Echocardiogram 01/10/2014  LV EF: 50% -  55%  ------------------------------------------------------------------- Indications:   Respiratory Failure acute 518.81.  ------------------------------------------------------------------- History:  Risk factors: Leg edema.  ------------------------------------------------------------------- Study Conclusions  - Left ventricle: The cavity size was normal. Wall thickness was increased  in a pattern of mild LVH. Systolic function was normal. The estimated ejection fraction was in the range of 50% to 55%. - Left atrium: The atrium was mildly dilated. - Pulmonary arteries: PA peak pressure: 32 mm Hg (S).     Radiology/Studies  Ct Abdomen Pelvis Wo Contrast  04/05/2014   CLINICAL DATA:  Right-sided flank pain with history of stones.  EXAM: CT ABDOMEN AND PELVIS WITHOUT CONTRAST  TECHNIQUE: Multidetector CT imaging of the abdomen and pelvis was performed following the standard protocol without IV contrast.  COMPARISON:  MRI abdomen 03/26/2014  FINDINGS: Lower chest:  Mild basilar atelectasis.  The heart is enlarged.  Hepatobiliary: No focal hepatic lesions non contrast exam. Gallbladder is normal.  Pancreas: Pancreas is normal. No ductal dilatation. No  pancreatic inflammation.  Spleen: Normal spleen.  Adrenals/urinary tract: Adrenal glands are normal. Post left nephrectomy. No nodularity in nephrectomy bed. Right kidney is normal. The right ureter and bladder are normal.  Stomach/Bowel: The stomach, small bowel, and cecum are normal. Colon and rectosigmoid colon are normal.  Vascular/Lymphatic: Abdominal aorta is normal caliber. There is no retroperitoneal or periportal lymphadenopathy. No pelvic lymphadenopathy.  Reproductive: Post hysterectomy anatomy. No free fluid the pelvis. No pelvic lymphadenopathy.  Musculoskeletal: No aggressive osseous lesion.  Other: No free fluid.  Inflammation.  IMPRESSION: 1. Normal right kidney on noncontrast exam. No ureterolithiasis or nephrolithiasis. Bladder appears normal. 2. Left nephrectomy without complication. 3. Post hysterectomy   Electronically Signed   By: Suzy Bouchard M.D.   On: 04/05/2014 22:06   Dg Chest 2 View  04/13/2014   CLINICAL DATA:  Shortness of breath, abnormal chest x-ray.  EXAM: CHEST  2 VIEW  COMPARISON:  February 10, 2015.  FINDINGS: Stable cardiomediastinal silhouette. Right upper lobe opacity noted on prior exam is improved although residual density remains. Left upper lobe and perihilar opacity is unchanged concerning for pneumonia. No pneumothorax or significant pleural effusion is noted. Bony thorax is intact.  IMPRESSION: Persistent left upper lobe and perihilar opacity is noted most consistent with pneumonia. Significantly improved right upper lobe pneumonia compared to prior exam.   Electronically Signed   By: Sabino Dick M.D.   On: 04/13/2014 18:38   Dg Chest 2 View  04/11/2014   CLINICAL DATA:  Shortness of breath  EXAM: CHEST  2 VIEW  COMPARISON:  02/08/2014  FINDINGS: Dense irregularly-shaped opacities in the upper lungs which are new. Trace pleural effusions. Chronic moderate cardiomegaly and aortic tortuosity.  IMPRESSION: Biapical airspace is primarily concerning for pneumonia, but  a prior episode of CHF had a similar appearance. As well, there are small pleural effusions. Suggest follow-up after diuresis.   Electronically Signed   By: Jorje Guild M.D.   On: 04/11/2014 00:44   Mr Abdomen Wo Contrast  03/26/2014   CLINICAL DATA:  Evaluate pancreas cyst  EXAM: MRI ABDOMEN WITHOUT CONTRAST  TECHNIQUE: Multiplanar multisequence MR imaging was performed without the administration of intravenous contrast.  COMPARISON:  10/29/2013  FINDINGS: Lower chest: There is no pleural or pericardial effusion identified. The heart size appears moderately enlarged.  Hepatobiliary: On the diffusion weighted images the previously described hypervascular liver lesions within the left hepatic lobe are identified and appears stable measuring 1 cm, image number 18/series 8. T2 hyperintense structure in the left hepatic lobe is unchanged , image number 9/series 7.  Pancreas: Numerous unilocular, T2 hyperintense structures are identified within the pancreas. The largest is in the body of pancreas measuring 1.4 cm, image 17/series  6. This is unchanged from previous exam the index lesion within the tail of pancreas is also stable measuring 1.1 cm, image 11/series 6. Several T2 hyperintense structures are noted within the uncinate process. The largest measures 1.1 cm, image 13/series 6. These are unchanged from previous exam.  Spleen: The spleen is normal.  Adrenals/Urinary Tract: The adrenal glands are both normal. Status post left nephrectomy. Normal appearance of the right kidney.  Stomach/Bowel: The visualized upper abdominal bowel loops and stomach appear within normal limits.  Vascular/Lymphatic: Normal caliber of the abdominal aorta. No adenopathy identified within the upper abdomen.  Other: No free fluid or fluid collections within the upper abdomen.  Musculoskeletal: No abnormal areas of signal from within the bone marrow.  IMPRESSION: 1. Overall stable exam compared with 10/29/2013. 2. Although performed  to without IV contrast material the previously described hypervascular liver lesions (identified on diffusion weighted sequences) are unchanged from previous exam favoring a benign abnormality. 3. Cysts within the liver and pancreas are stable from previous exam. A single followup examination at 12 months (10/29/2013) is recommended to confirm stability.   Electronically Signed   By: Kerby Moors M.D.   On: 03/26/2014 12:09   Dg Chest Port 1 View  04/15/2014   CLINICAL DATA:  Shortness of breath, pulmonary infiltrates  EXAM: PORTABLE CHEST - 1 VIEW  COMPARISON:  04/13/2014  FINDINGS: Cardiomediastinal silhouette is stable. Persistent left upper lobe and left perihilar infiltrates. New infiltrates right perihilar and right infrahilar region. Question small left pleural effusion. Findings suspicious for worsening multifocal pneumonia rather than pulmonary edema.  IMPRESSION: Persistent left upper lobe and left perihilar infiltrates. New infiltrates right perihilar and right infrahilar region. Question small left pleural effusion. Findings suspicious for worsening multifocal pneumonia rather than pulmonary edema.   Electronically Signed   By: Lahoma Crocker M.D.   On: 04/15/2014 08:08    ASSESSMENT AND PLAN  1. Acute on chronic diastolic HF  - RHC 99991111 wedge pressure 26, PA pressure 64/25/40. CI 4.1 and CO 7.8  - net I/O -6L, weight does not appear to be accurate   - likely can transition to PO lasix this afternoon or tomorrow. O2 sat 100% with walking off of O2 this morning.   2. Chronic stage IV renal failure: nephrology following  3. HTN 4. Mildly elevated troponin: not consistent with ACS, likely due to CHF  - plan for outpatient myoview. Would try to avoid LHC given significant renal insufficiency  Signed, Almyra Deforest PA-C Pager: [phone] As above, patient seen and examined. Her dyspnea is improving with diuresis. Continue Lasix 40 mg IV twice a day today. Hopefully can transition to oral Lasix  tomorrow. Follow renal function closely. Plan outpatient nuclear study after she improves. She will need close follow-up with Dr. Mare Ferrari following discharge. Kirk Ruths

## 2014-04-17 NOTE — Progress Notes (Signed)
Patient ID: Doris Howell, female   DOB: 1936-04-01, 79 y.o.   MRN: [id number]  [facility] Progress Note    Assessment/ Plan:   1. Chronic kidney disease stage IV: Solitary kidney with hypertensive nephrosclerosis-possible element of diabetic kidney disease. Currently with acute on chronic renal failure in the face of what appears to be diastolic heart failure. Showed a good response to diuretic therapy overnight with improvement of renal function-agree with conversion to oral diuretic dose later today. 2. Acute hypoxic respiratory failure: Appears to be bi-factorial from CHF exacerbation/possible pulmonary process-on diuretic therapy (currently on hold) and prednisone. 3. Hypertension: Fairly controlled on current management with amlodipine and carvedilol. 4. Screening for metabolic bone disease: Phosphorus levels acceptable-check intact PTH. 5. Screening for anemia of chronic kidney disease: Iron stores will be checked and replaced prior to starting ESA therapy.  Subjective:   Reports to be feeling well and happy that renal function remained stable    Objective:   BP 131/49 mmHg  Pulse 66  Temp(Src) 98.2 F (36.8 C) (Oral)  Resp 18  Ht 5\' 1"  (1.549 m)  Wt 90.901 kg (200 lb 6.4 oz)  BMI 37.88 kg/m2  SpO2 100%  Intake/Output Summary (Last 24 hours) at 04/17/14 1058 Last data filed at 04/17/14 1025  Gross per 24 hour  Intake   1325 ml  Output   3851 ml  Net  -2526 ml   Weight change: -1.299 kg (-2 lb 13.8 oz)  Physical Exam: Gen: Comfortably resting in bed CVS: Pulse regular in rate and rhythm Resp: Clear to auscultation, no rales Abd: Obese, nontender Ext: Trace lower extremity edema  Imaging: No results found.  Labs: BMET  Recent Labs Lab 04/10/14 2350 04/11/14 0558 04/13/14 0400 04/14/14 0405 04/15/14 0408 04/16/14 0330 04/17/14 0548  NA 139 141 138 141 141 140 142  K 3.2* 3.3* 4.1 4.0 4.2 4.1 4.0  CL 103 104 103 106 104 104 104  CO2  26 25 27 26 28 24  32  GLUCOSE 187* 131* 219* 218* 145* 225* 155*  BUN 40* 39* 51* 61* 70* 74* 69*  CREATININE 2.12* 2.05* 2.28* 2.23* 2.34* 2.34* 2.06*  CALCIUM 9.7 9.3 9.3 9.1 8.9 9.1 8.7  PHOS  --   --   --   --  4.7*  --   --    CBC  Recent Labs Lab 04/14/14 0405 04/15/14 0408 04/16/14 0330 04/17/14 0548  WBC 10.4 10.3 9.8 10.2  HGB 8.3* 8.1* 8.7* 8.3*  HCT 26.3* 25.7* 27.1* 25.7*  MCV 89.5 89.9 91.6 89.9  PLT 271 278 273 260    Medications:    . allopurinol  100 mg Oral BID  . amLODipine  10 mg Oral q morning - 10a  . aspirin EC  81 mg Oral Daily  . carvedilol  6.25 mg Oral BID WC  . docusate sodium  100 mg Oral BID  . furosemide  40 mg Intravenous BID  . heparin  5,000 Units Subcutaneous 3 times per day  . insulin aspart  0-15 Units Subcutaneous TID WC  . insulin aspart  0-5 Units Subcutaneous QHS  . latanoprost  1 drop Both Eyes QHS  . simvastatin  20 mg Oral QPM  . sodium chloride  3 mL Intravenous Q12H    Elmarie Shiley, MD 04/17/2014, 10:58 AM

## 2014-04-17 NOTE — Progress Notes (Signed)
Inpatient Diabetes Program Recommendations  AACE/ADA: New Consensus Statement on Inpatient Glycemic Control (2013)  Target Ranges:  Prepandial:   less than 140 mg/dL      Peak postprandial:   less than 180 mg/dL (1-2 hours)      Critically ill patients:  140 - 180 mg/dL  Results for Doris Howell, Doris Howell (MRN [id number]) as of 04/17/2014 13:57  Ref. Range 04/17/2014 06:15 04/17/2014 11:42  Glucose-Capillary Latest Range: 70-99 mg/dL 134 (H) 206 (H)   Recommend adding Novolog 4 units TID with meals per Glycemic Control Order-set for elevated postprandial CBGs.  Thank you  Raoul Pitch BSN, RN,CDE Inpatient Diabetes Coordinator [phone] (team pager)

## 2014-04-17 NOTE — Progress Notes (Signed)
Patient is unwilling to take oxygen off at this time.

## 2014-04-17 NOTE — Progress Notes (Signed)
PROGRESS NOTE    Doris Howell [id number] DOB: 01/14/1936 DOA: 04/10/2014 PCP: Irven Shelling, MD  HPI/Brief narrative 79 year old female patient with history of HTN, HLD, DM 2, GERD, gout, CKD, anemia, OSA, presented with acute/subacute onset of dyspnea and hypoxia. Lifelong nonsmoker but exposed to secondhand smoke exposure spouse. This is her third admission in the last 3 months for similar presentation. Assessed to have acute on chronic diastolic CHF. Improved with diuretics. Cardiology felt that her presentation was more in keeping with a pulmonary etiology. Pulmonary consulted on 04/13/14 and felt all her presentation was primarily cardiac including previous admission.   Assessment/Plan:  1. Acute respiratory failure with hypoxia: Most likely secondary to acute diastolic CHF. She was diuresed with IV Lasix and has clinically improved. VQ scan during recent admission was negative. Continues to have some shortness of breath 2. Acute on chronic diastolic CHF: Cardiology following. Patient having improvement to dyspnea, will plan on one more day of lasix at 40 mg IV BID, plan to transition to oral lasix in am.  3. Bradycardia: Coreg was reduced to 6.25 MG twice a day. Bradycardia resolved. 4. Mildly elevated troponin: Likely secondary to demand ischemia from acute respiratory failure and acute diastolic CHF.Plan for outpatient nuclear study 5. Essential hypertension: Continue amlodipine and carvedilol. 6. Hyperlipidemia: Continue simvastatin 7. Uncontrolled DM 2: Continue NovoLog SSI. Worsened by steroids-reducing steroids. Oral hypoglycemics on hold. Fluctuating CBGs-may need insulin adjustment. 8. Stage 4 chronic kidney disease: Patient with history of solitary kidney. Status post right heart catheterization performed on 04/15/2014. Labs showing a stable creatinine. Continue IV diuresis follow-up on a.m. labs to monitor kidney function. 9. Chronic anemia: Likely secondary to  chronic kidney disease. A.m. labs showing hemoglobin of 8.7 10. Hypokalemia: Replaced 11. Suspected OHS/OSA: Outpatient workup with sleep study   Code Status: Full Family Communication: None at bedside Disposition Plan: Not medically stable for discharge   Consultants:  Cardiology  Pulmonology  Nephrology  Procedures:  None  Antibiotics:  IV levofloxacin -DC'd  Subjective: She was ambulated down the hallway, maintained sats in the mid 90's.   Objective: Filed Vitals:   04/16/14 2058 04/17/14 0555 04/17/14 1107 04/17/14 1533  BP: 169/62 131/49 146/60 150/58  Pulse: 68 66 67 68  Temp: 98 F (36.7 C) 98.2 F (36.8 C)  98.2 F (36.8 C)  TempSrc: Oral Oral  Oral  Resp: 18 18  18   Height:      Weight:  90.901 kg (200 lb 6.4 oz)    SpO2: 100% 100%  100%    Intake/Output Summary (Last 24 hours) at 04/17/14 1614 Last data filed at 04/17/14 1538  Gross per 24 hour  Intake   1223 ml  Output   3376 ml  Net  -2153 ml   Filed Weights   04/15/14 0516 04/16/14 0557 04/17/14 0555  Weight: 91.309 kg (201 lb 4.8 oz) 92.2 kg (203 lb 4.2 oz) 90.901 kg (200 lb 6.4 oz)     Exam:  General exam: NAD, ambulated down the hallway Respiratory system: Diminished breath sounds in the bases. Rest of lung fields clear to auscultation. No increased work of breathing. No wheezing or rhonchi. Cardiovascular system: S1 & S2 heard, RRR. No JVD, murmurs, gallops, clicks or pedal edema. Telemetry: Sinus rhythm. Gastrointestinal system: Abdomen is nondistended, soft and nontender. Normal bowel sounds heard. Central nervous system: Alert and oriented. No focal neurological deficits. Extremities: Symmetric 5 x 5 power.   Data Reviewed: Basic Metabolic Panel:  Recent Labs  Lab 04/13/14 0400 04/14/14 0405 04/15/14 0408 04/16/14 0330 04/17/14 0548  NA 138 141 141 140 142  K 4.1 4.0 4.2 4.1 4.0  CL 103 106 104 104 104  CO2 27 26 28 24  32  GLUCOSE 219* 218* 145* 225* 155*  BUN 51*  61* 70* 74* 69*  CREATININE 2.28* 2.23* 2.34* 2.34* 2.06*  CALCIUM 9.3 9.1 8.9 9.1 8.7  PHOS  --   --  4.7*  --   --    Liver Function Tests:  Recent Labs Lab 04/11/14 0558 04/15/14 0408  AST 26  --   ALT 18  --   ALKPHOS 86  --   BILITOT 0.5  --   PROT 7.3  --   ALBUMIN 3.2* 2.8*   No results for input(s): LIPASE, AMYLASE in the last 168 hours. No results for input(s): AMMONIA in the last 168 hours. CBC:  Recent Labs Lab 04/11/14 0558 04/14/14 0405 04/15/14 0408 04/16/14 0330 04/17/14 0548  WBC 7.0 10.4 10.3 9.8 10.2  HGB 8.9* 8.3* 8.1* 8.7* 8.3*  HCT 27.4* 26.3* 25.7* 27.1* 25.7*  MCV 90.7 89.5 89.9 91.6 89.9  PLT 278 271 278 273 260   Cardiac Enzymes:  Recent Labs Lab 04/11/14 0558  TROPONINI 0.06*   BNP (last 3 results)  Recent Labs  01/09/14 1515 02/05/14 1216  PROBNP 2595.0* 2767.0*   CBG:  Recent Labs Lab 04/16/14 1704 04/16/14 2117 04/17/14 0615 04/17/14 1142 04/17/14 1530  GLUCAP 302* 172* 134* 206* 149*    No results found for this or any previous visit (from the past 240 hour(s)).       Studies: No results found.      Scheduled Meds: . allopurinol  100 mg Oral BID  . amLODipine  10 mg Oral q morning - 10a  . aspirin EC  81 mg Oral Daily  . carvedilol  6.25 mg Oral BID WC  . docusate sodium  100 mg Oral BID  . furosemide  40 mg Intravenous BID  . heparin  5,000 Units Subcutaneous 3 times per day  . insulin aspart  0-15 Units Subcutaneous TID WC  . insulin aspart  0-5 Units Subcutaneous QHS  . insulin aspart  4 Units Subcutaneous TID WC  . latanoprost  1 drop Both Eyes QHS  . simvastatin  20 mg Oral QPM  . sodium chloride  3 mL Intravenous Q12H   Continuous Infusions:    Principal Problem:   Diastolic CHF, acute on chronic Active Problems:   CKD (chronic kidney disease), stage III   HCAP (healthcare-associated pneumonia)   Diabetes mellitus   Essential hypertension   Gout   SOB (shortness of breath)    Obesity (BMI 37)   Bradycardia- beta blocker decreased   Pulmonary infiltrates    Time spent: 25 minutes    Kelvin Cellar, MD, FACP, FHM. [facility] Pager [phone]  If 7PM-7AM, please contact night-coverage [email or web address] Password [email or web address] 04/17/2014, 4:14 PM    LOS: 7 days

## 2014-04-18 ENCOUNTER — Inpatient Hospital Stay (HOSPITAL_COMMUNITY): Payer: Medicare Other

## 2014-04-18 LAB — GLUCOSE, CAPILLARY
Glucose-Capillary: 111 mg/dL — ABNORMAL HIGH (ref 70–99)
Glucose-Capillary: 181 mg/dL — ABNORMAL HIGH (ref 70–99)
Glucose-Capillary: 185 mg/dL — ABNORMAL HIGH (ref 70–99)
Glucose-Capillary: 177 mg/dL — ABNORMAL HIGH (ref 70–99)

## 2014-04-18 LAB — BASIC METABOLIC PANEL
Anion gap: 11 (ref 5–15)
BUN: 60 mg/dL — ABNORMAL HIGH (ref 6–23)
CO2: 32 mmol/L (ref 19–32)
Calcium: 8.8 mg/dL (ref 8.4–10.5)
Chloride: 98 mEq/L (ref 96–112)
Creatinine, Ser: 2 mg/dL — ABNORMAL HIGH (ref 0.50–1.10)
GFR calc Af Amer: 27 mL/min — ABNORMAL LOW (ref >90)
GFR calc non Af Amer: 23 mL/min — ABNORMAL LOW (ref >90)
Glucose, Bld: 112 mg/dL — ABNORMAL HIGH (ref 70–99)
Potassium: 3.5 mmol/L (ref 3.5–5.1)
Sodium: 141 mmol/L (ref 135–145)

## 2014-04-18 MED ORDER — POLYETHYLENE GLYCOL 3350 17 G PO PACK
17.0000 g | PACK | Freq: Every day | ORAL | Status: DC
  Administered 2014-04-18: 17 g via ORAL
  Filled 2014-04-18 (×2): qty 1

## 2014-04-18 MED ORDER — POTASSIUM CHLORIDE CRYS ER 20 MEQ PO TBCR
40.0000 meq | EXTENDED_RELEASE_TABLET | Freq: Once | ORAL | Status: AC
  Administered 2014-04-18: 40 meq via ORAL
  Filled 2014-04-18: qty 2

## 2014-04-18 MED ORDER — FUROSEMIDE 40 MG PO TABS
40.0000 mg | ORAL_TABLET | Freq: Two times a day (BID) | ORAL | Status: DC
  Administered 2014-04-18 – 2014-04-19 (×2): 40 mg via ORAL
  Filled 2014-04-18 (×6): qty 1

## 2014-04-18 NOTE — Progress Notes (Signed)
PROGRESS NOTE    Doris Howell [id number] DOB: Oct 16, 1935 DOA: 04/10/2014 PCP: Irven Shelling, MD  HPI/Brief narrative 79 year old female patient with history of HTN, HLD, DM 2, GERD, gout, CKD, anemia, OSA, presented with acute/subacute onset of dyspnea and hypoxia. Lifelong nonsmoker but exposed to secondhand smoke exposure spouse. This is her third admission in the last 3 months for similar presentation. Assessed to have acute on chronic diastolic CHF. Improved with diuretics. Cardiology felt that her presentation was more in keeping with a pulmonary etiology. Pulmonary consulted on 04/13/14 and felt all her presentation was primarily cardiac including previous admission.   Assessment/Plan:  1. Acute respiratory failure with hypoxia: Most likely secondary to acute diastolic CHF. She was diuresed with IV Lasix and has clinically improved. She is doing better, was transitioned to lasix 40 mg PO BID 2. Acute on chronic diastolic CHF: Transitioned to Lasix 40 mg PO BID, clinically improved, anticipate discharge in the next 24 hours 3. Bradycardia: Coreg was reduced to 6.25 MG twice a day. Bradycardia resolved. 4. Mildly elevated troponin: Likely secondary to demand ischemia from acute respiratory failure and acute diastolic CHF.Plan for outpatient nuclear study 5. Essential hypertension: Continue amlodipine and carvedilol. 6. Hyperlipidemia: Continue simvastatin 7. Uncontrolled DM 2: Improved blood sugars. Remains on novolog 4 units  TID 8. Stage 4 chronic kidney disease: Patient with history of solitary kidney. Status post right heart catheterization performed on 04/15/2014. Labs showing a stable creatinine. Continue IV diuresis follow-up on a.m. labs to monitor kidney function. 9. Chronic anemia: Likely secondary to chronic kidney disease. A.m. labs showing hemoglobin of 8.7 10. Hypokalemia: Replaced 11. Suspected OHS/OSA: Outpatient workup with sleep study   Code Status:  Full Family Communication: None at bedside Disposition Plan: Not medically stable for discharge   Consultants:  Cardiology  Pulmonology  Nephrology  Procedures:  None  Antibiotics:  IV levofloxacin -DC'd  Subjective: She reports feeling better, awake and alert ambulating around her room, tolerating PO intake.   Objective: Filed Vitals:   04/17/14 2125 04/18/14 0508 04/18/14 1034 04/18/14 1530  BP: 158/65 143/48 146/53 143/57  Pulse: 74 79 77 65  Temp: 98.5 F (36.9 C) 98.4 F (36.9 C)  98 F (36.7 C)  TempSrc: Oral Oral  Oral  Resp: 18 18  18   Height:      Weight:  88.2 kg (194 lb 7.1 oz)    SpO2: 95% 97%  91%    Intake/Output Summary (Last 24 hours) at 04/18/14 1602 Last data filed at 04/18/14 1121  Gross per 24 hour  Intake    658 ml  Output   3200 ml  Net  -2542 ml   Filed Weights   04/16/14 0557 04/17/14 0555 04/18/14 0508  Weight: 92.2 kg (203 lb 4.2 oz) 90.901 kg (200 lb 6.4 oz) 88.2 kg (194 lb 7.1 oz)     Exam:  General exam: NAD, ambulated down the hallway Respiratory system: Diminished breath sounds in the bases. Rest of lung fields clear to auscultation. No increased work of breathing. No wheezing or rhonchi. Cardiovascular system: S1 & S2 heard, RRR. No JVD, murmurs, gallops, clicks or pedal edema. Telemetry: Sinus rhythm. Gastrointestinal system: Abdomen is nondistended, soft and nontender. Normal bowel sounds heard. Central nervous system: Alert and oriented. No focal neurological deficits. Extremities: Symmetric 5 x 5 power.   Data Reviewed: Basic Metabolic Panel:  Recent Labs Lab 04/14/14 0405 04/15/14 0408 04/16/14 0330 04/17/14 0548 04/18/14 0500  NA 141 141 140 142 141  K 4.0 4.2 4.1 4.0 3.5  CL 106 104 104 104 98  CO2 26 28 24  32 32  GLUCOSE 218* 145* 225* 155* 112*  BUN 61* 70* 74* 69* 60*  CREATININE 2.23* 2.34* 2.34* 2.06* 2.00*  CALCIUM 9.1 8.9 9.1 8.7 8.8  PHOS  --  4.7*  --   --   --    Liver Function  Tests:  Recent Labs Lab 04/15/14 0408  ALBUMIN 2.8*   No results for input(s): LIPASE, AMYLASE in the last 168 hours. No results for input(s): AMMONIA in the last 168 hours. CBC:  Recent Labs Lab 04/14/14 0405 04/15/14 0408 04/16/14 0330 04/17/14 0548  WBC 10.4 10.3 9.8 10.2  HGB 8.3* 8.1* 8.7* 8.3*  HCT 26.3* 25.7* 27.1* 25.7*  MCV 89.5 89.9 91.6 89.9  PLT 271 278 273 260   Cardiac Enzymes: No results for input(s): CKTOTAL, CKMB, CKMBINDEX, TROPONINI in the last 168 hours. BNP (last 3 results)  Recent Labs  01/09/14 1515 02/05/14 1216  PROBNP 2595.0* 2767.0*   CBG:  Recent Labs Lab 04/17/14 1142 04/17/14 1530 04/17/14 2128 04/18/14 0603 04/18/14 1120  GLUCAP 206* 149* 156* 111* 181*    No results found for this or any previous visit (from the past 240 hour(s)).       Studies: Dg Chest 2 View  04/18/2014   CLINICAL DATA:  Nausea, congestive heart failure  EXAM: CHEST  2 VIEW  COMPARISON:  04/15/2013  FINDINGS: Cardiomediastinal silhouette is stable. No convincing pulmonary edema. Slight improvement in aeration. Residual infiltrates right infrahilar and left upper lobe. Mild degenerative changes mid and lower thoracic spine.  IMPRESSION: Slight improvement in aeration. Residual infiltrates right infrahilar and left upper lobe. No pulmonary edema. No new infiltrates.   Electronically Signed   By: Lahoma Crocker M.D.   On: 04/18/2014 11:13        Scheduled Meds: . allopurinol  100 mg Oral BID  . amLODipine  10 mg Oral q morning - 10a  . aspirin EC  81 mg Oral Daily  . carvedilol  6.25 mg Oral BID WC  . docusate sodium  100 mg Oral BID  . furosemide  40 mg Oral BID  . heparin  5,000 Units Subcutaneous 3 times per day  . insulin aspart  0-15 Units Subcutaneous TID WC  . insulin aspart  0-5 Units Subcutaneous QHS  . insulin aspart  4 Units Subcutaneous TID WC  . latanoprost  1 drop Both Eyes QHS  . polyethylene glycol  17 g Oral Daily  . simvastatin  20  mg Oral QPM  . sodium chloride  3 mL Intravenous Q12H   Continuous Infusions:    Principal Problem:   Diastolic CHF, acute on chronic Active Problems:   CKD (chronic kidney disease), stage III   HCAP (healthcare-associated pneumonia)   Diabetes mellitus   Essential hypertension   Gout   SOB (shortness of breath)   Obesity (BMI 37)   Bradycardia- beta blocker decreased   Pulmonary infiltrates    Time spent: 25 minutes    Kelvin Cellar, MD, FACP, FHM. [facility] Pager [phone]  If 7PM-7AM, please contact night-coverage [email or web address] Password [email or web address] 04/18/2014, 4:02 PM    LOS: 8 days

## 2014-04-18 NOTE — Progress Notes (Signed)
Patient ID: Doris Howell, female   DOB: [birth year], 79 y.o.   MRN: [id number]  [facility] Progress Note    Assessment/ Plan:   1. Chronic kidney disease stage IV: Solitary kidney with hypertensive nephrosclerosis-possible element of diabetic kidney disease. Currently with acute on chronic renal failure in the face of what appears to be diastolic heart failure. Stable/slightly improved renal function with current diuretic therapy-continue to monitor. I have set her up to follow up with me as an outpatient in one month following discharge 2. Acute hypoxic respiratory failure: Appears to be bi-factorial from CHF exacerbation/possible pulmonary process-on diuretic therapy (currently on hold) and prednisone. 3. Hypertension: Fairly controlled on current management with amlodipine and carvedilol. 4. Screening for metabolic bone disease: Phosphorus levels acceptable-check intact PTH. 5. Screening for anemia of chronic kidney disease: Iron stores will be checked and replaced prior to starting ESA therapy.  Subjective:   Reports to be feeling better-able to ambulate with less exertional dyspnea    Objective:   BP 143/48 mmHg  Pulse 79  Temp(Src) 98.4 F (36.9 C) (Oral)  Resp 18  Ht 5\' 1"  (1.549 m)  Wt 88.2 kg (194 lb 7.1 oz)  BMI 36.76 kg/m2  SpO2 97%  Intake/Output Summary (Last 24 hours) at 04/18/14 1008 Last data filed at 04/18/14 0900  Gross per 24 hour  Intake   1378 ml  Output   3675 ml  Net  -2297 ml   Weight change: -2.701 kg (-5 lb 15.3 oz)  Physical Exam: Gen: Comfortably resting in bed CVS: Pulse regular in rate and rhythm Resp: Clear to auscultation, no rales Abd: Soft, obese, nontender Ext: Trace lower extremity edema  Imaging: No results found.  Labs: BMET  Recent Labs Lab 04/13/14 0400 04/14/14 0405 04/15/14 0408 04/16/14 0330 04/17/14 0548 04/18/14 0500  NA 138 141 141 140 142 141  K 4.1 4.0 4.2 4.1 4.0 3.5  CL 103 106 104 104 104  98  CO2 27 26 28 24  32 32  GLUCOSE 219* 218* 145* 225* 155* 112*  BUN 51* 61* 70* 74* 69* 60*  CREATININE 2.28* 2.23* 2.34* 2.34* 2.06* 2.00*  CALCIUM 9.3 9.1 8.9 9.1 8.7 8.8  PHOS  --   --  4.7*  --   --   --    CBC  Recent Labs Lab 04/14/14 0405 04/15/14 0408 04/16/14 0330 04/17/14 0548  WBC 10.4 10.3 9.8 10.2  HGB 8.3* 8.1* 8.7* 8.3*  HCT 26.3* 25.7* 27.1* 25.7*  MCV 89.5 89.9 91.6 89.9  PLT 271 278 273 260   Medications:    . allopurinol  100 mg Oral BID  . amLODipine  10 mg Oral q morning - 10a  . aspirin EC  81 mg Oral Daily  . carvedilol  6.25 mg Oral BID WC  . docusate sodium  100 mg Oral BID  . furosemide  40 mg Oral BID  . heparin  5,000 Units Subcutaneous 3 times per day  . insulin aspart  0-15 Units Subcutaneous TID WC  . insulin aspart  0-5 Units Subcutaneous QHS  . insulin aspart  4 Units Subcutaneous TID WC  . latanoprost  1 drop Both Eyes QHS  . polyethylene glycol  17 g Oral Daily  . simvastatin  20 mg Oral QPM  . sodium chloride  3 mL Intravenous Q12H   Elmarie Shiley, MD 04/18/2014, 10:08 AM

## 2014-04-18 NOTE — Progress Notes (Signed)
PT Cancellation Note  Patient Details Name: Doris Howell MRN: [id number] DOB: 1935/12/30   Cancelled Treatment:    Reason Eval/Treat Not Completed: Medical issues which prohibited therapy (Pt reports she is nauseous and doesn't feel well.) Instructed pt to amb with nursing staff later today if she feels better.   Chidera Dearcos 04/18/2014, 2:32 PM  [facility] PT [phone]

## 2014-04-18 NOTE — Progress Notes (Addendum)
Patient Name: Doris Howell Date of Encounter: 04/18/2014    SUBJECTIVE  Dyspnea improving; no chest pain  CURRENT MEDS . allopurinol  100 mg Oral BID  . amLODipine  10 mg Oral q morning - 10a  . aspirin EC  81 mg Oral Daily  . carvedilol  6.25 mg Oral BID WC  . docusate sodium  100 mg Oral BID  . furosemide  40 mg Intravenous BID  . heparin  5,000 Units Subcutaneous 3 times per day  . insulin aspart  0-15 Units Subcutaneous TID WC  . insulin aspart  0-5 Units Subcutaneous QHS  . insulin aspart  4 Units Subcutaneous TID WC  . latanoprost  1 drop Both Eyes QHS  . polyethylene glycol  17 g Oral Daily  . simvastatin  20 mg Oral QPM  . sodium chloride  3 mL Intravenous Q12H    OBJECTIVE  Filed Vitals:   04/17/14 1107 04/17/14 1533 04/17/14 2125 04/18/14 0508  BP: 146/60 150/58 158/65 143/48  Pulse: 67 68 74 79  Temp:  98.2 F (36.8 C) 98.5 F (36.9 C) 98.4 F (36.9 C)  TempSrc:  Oral Oral Oral  Resp:  18 18 18   Height:      Weight:    194 lb 7.1 oz (88.2 kg)  SpO2:  100% 95% 97%    Intake/Output Summary (Last 24 hours) at 04/18/14 0921 Last data filed at 04/18/14 0900  Gross per 24 hour  Intake   1378 ml  Output   3675 ml  Net  -2297 ml   Filed Weights   04/16/14 0557 04/17/14 0555 04/18/14 0508  Weight: 203 lb 4.2 oz (92.2 kg) 200 lb 6.4 oz (90.901 kg) 194 lb 7.1 oz (88.2 kg)    PHYSICAL EXAM  General: Pleasant, NAD. Neuro: Alert and oriented X 3. Moves all extremities spontaneously. HEENT:  Normal  Neck: Supple  Lungs:  Mildly diminished BS bases Heart: RRR no s3, s4, or murmurs. Abdomen: Soft, non-tender, non-distended Extremities: No edema  Accessory Clinical Findings  CBC  Recent Labs  04/16/14 0330 04/17/14 0548  WBC 9.8 10.2  HGB 8.7* 8.3*  HCT 27.1* 25.7*  MCV 91.6 89.9  PLT 273 [id number]   Basic Metabolic Panel  Recent Labs  04/17/14 0548 04/18/14 0500  NA 142 141  K 4.0 3.5  CL 104 98  CO2 32 32  GLUCOSE 155* 112*    BUN 69* 60*  CREATININE 2.06* 2.00*  CALCIUM 8.7 8.8    Echocardiogram 01/10/2014  LV EF: 50% -  55%  ------------------------------------------------------------------- Indications:   Respiratory Failure acute 518.81.  ------------------------------------------------------------------- History:  Risk factors: Leg edema.  ------------------------------------------------------------------- Study Conclusions  - Left ventricle: The cavity size was normal. Wall thickness was increased in a pattern of mild LVH. Systolic function was normal. The estimated ejection fraction was in the range of 50% to 55%. - Left atrium: The atrium was mildly dilated. - Pulmonary arteries: PA peak pressure: 32 mm Hg (S).     Radiology/Studies  Ct Abdomen Pelvis Wo Contrast  04/05/2014   CLINICAL DATA:  Right-sided flank pain with history of stones.  EXAM: CT ABDOMEN AND PELVIS WITHOUT CONTRAST  TECHNIQUE: Multidetector CT imaging of the abdomen and pelvis was performed following the standard protocol without IV contrast.  COMPARISON:  MRI abdomen 03/26/2014  FINDINGS: Lower chest:  Mild basilar atelectasis.  The heart is enlarged.  Hepatobiliary: No focal hepatic lesions non contrast exam. Gallbladder is normal.  Pancreas: Pancreas  is normal. No ductal dilatation. No pancreatic inflammation.  Spleen: Normal spleen.  Adrenals/urinary tract: Adrenal glands are normal. Post left nephrectomy. No nodularity in nephrectomy bed. Right kidney is normal. The right ureter and bladder are normal.  Stomach/Bowel: The stomach, small bowel, and cecum are normal. Colon and rectosigmoid colon are normal.  Vascular/Lymphatic: Abdominal aorta is normal caliber. There is no retroperitoneal or periportal lymphadenopathy. No pelvic lymphadenopathy.  Reproductive: Post hysterectomy anatomy. No free fluid the pelvis. No pelvic lymphadenopathy.  Musculoskeletal: No aggressive osseous lesion.  Other: No free fluid.   Inflammation.  IMPRESSION: 1. Normal right kidney on noncontrast exam. No ureterolithiasis or nephrolithiasis. Bladder appears normal. 2. Left nephrectomy without complication. 3. Post hysterectomy   Electronically Signed   By: Suzy Bouchard M.D.   On: 04/05/2014 22:06   Dg Chest 2 View  04/13/2014   CLINICAL DATA:  Shortness of breath, abnormal chest x-ray.  EXAM: CHEST  2 VIEW  COMPARISON:  February 10, 2015.  FINDINGS: Stable cardiomediastinal silhouette. Right upper lobe opacity noted on prior exam is improved although residual density remains. Left upper lobe and perihilar opacity is unchanged concerning for pneumonia. No pneumothorax or significant pleural effusion is noted. Bony thorax is intact.  IMPRESSION: Persistent left upper lobe and perihilar opacity is noted most consistent with pneumonia. Significantly improved right upper lobe pneumonia compared to prior exam.   Electronically Signed   By: Sabino Dick M.D.   On: 04/13/2014 18:38   Dg Chest 2 View  04/11/2014   CLINICAL DATA:  Shortness of breath  EXAM: CHEST  2 VIEW  COMPARISON:  02/08/2014  FINDINGS: Dense irregularly-shaped opacities in the upper lungs which are new. Trace pleural effusions. Chronic moderate cardiomegaly and aortic tortuosity.  IMPRESSION: Biapical airspace is primarily concerning for pneumonia, but a prior episode of CHF had a similar appearance. As well, there are small pleural effusions. Suggest follow-up after diuresis.   Electronically Signed   By: Jorje Guild M.D.   On: 04/11/2014 00:44   Mr Abdomen Wo Contrast  03/26/2014   CLINICAL DATA:  Evaluate pancreas cyst  EXAM: MRI ABDOMEN WITHOUT CONTRAST  TECHNIQUE: Multiplanar multisequence MR imaging was performed without the administration of intravenous contrast.  COMPARISON:  10/29/2013  FINDINGS: Lower chest: There is no pleural or pericardial effusion identified. The heart size appears moderately enlarged.  Hepatobiliary: On the diffusion weighted images the  previously described hypervascular liver lesions within the left hepatic lobe are identified and appears stable measuring 1 cm, image number 18/series 8. T2 hyperintense structure in the left hepatic lobe is unchanged , image number 9/series 7.  Pancreas: Numerous unilocular, T2 hyperintense structures are identified within the pancreas. The largest is in the body of pancreas measuring 1.4 cm, image 17/series 6. This is unchanged from previous exam the index lesion within the tail of pancreas is also stable measuring 1.1 cm, image 11/series 6. Several T2 hyperintense structures are noted within the uncinate process. The largest measures 1.1 cm, image 13/series 6. These are unchanged from previous exam.  Spleen: The spleen is normal.  Adrenals/Urinary Tract: The adrenal glands are both normal. Status post left nephrectomy. Normal appearance of the right kidney.  Stomach/Bowel: The visualized upper abdominal bowel loops and stomach appear within normal limits.  Vascular/Lymphatic: Normal caliber of the abdominal aorta. No adenopathy identified within the upper abdomen.  Other: No free fluid or fluid collections within the upper abdomen.  Musculoskeletal: No abnormal areas of signal from within the bone marrow.  IMPRESSION: 1. Overall stable exam compared with 10/29/2013. 2. Although performed to without IV contrast material the previously described hypervascular liver lesions (identified on diffusion weighted sequences) are unchanged from previous exam favoring a benign abnormality. 3. Cysts within the liver and pancreas are stable from previous exam. A single followup examination at 12 months (10/29/2013) is recommended to confirm stability.   Electronically Signed   By: Kerby Moors M.D.   On: 03/26/2014 12:09   Dg Chest Port 1 View  04/15/2014   CLINICAL DATA:  Shortness of breath, pulmonary infiltrates  EXAM: PORTABLE CHEST - 1 VIEW  COMPARISON:  04/13/2014  FINDINGS: Cardiomediastinal silhouette is stable.  Persistent left upper lobe and left perihilar infiltrates. New infiltrates right perihilar and right infrahilar region. Question small left pleural effusion. Findings suspicious for worsening multifocal pneumonia rather than pulmonary edema.  IMPRESSION: Persistent left upper lobe and left perihilar infiltrates. New infiltrates right perihilar and right infrahilar region. Question small left pleural effusion. Findings suspicious for worsening multifocal pneumonia rather than pulmonary edema.   Electronically Signed   By: Lahoma Crocker M.D.   On: 04/15/2014 08:08    ASSESSMENT AND PLAN  1. Acute on chronic diastolic HF  - RHC 99991111 wedge pressure 26, PA pressure 64/25/40. CI 4.1 and CO 7.8  - much improved; change lasix to 40 mg po BID and ambulate; needs low NA diet at home and fluid restriction; repeat chest xray  2. Chronic stage IV renal failure: nephrology following  3. HTN  4. Mildly elevated troponin: not consistent with ACS, likely due to CHF  - plan for outpatient myoview. Would try to avoid LHC given significant renal insufficiency  5. Hyperlipidemia-would change zocor to pravachol 40 mg daily at DC given potential interaction with amlodipine.  Ambulate today and if stable, DC in AM with close fu with Dr Mare Ferrari.  Signed, Kirk Ruths

## 2014-04-19 LAB — GLUCOSE, CAPILLARY
Glucose-Capillary: 110 mg/dL — ABNORMAL HIGH (ref 70–99)
Glucose-Capillary: 168 mg/dL — ABNORMAL HIGH (ref 70–99)

## 2014-04-19 LAB — BASIC METABOLIC PANEL
Anion gap: 4 — ABNORMAL LOW (ref 5–15)
BUN: 47 mg/dL — ABNORMAL HIGH (ref 6–23)
CO2: 36 mmol/L — ABNORMAL HIGH (ref 19–32)
Calcium: 8.8 mg/dL (ref 8.4–10.5)
Chloride: 99 mEq/L (ref 96–112)
Creatinine, Ser: 1.88 mg/dL — ABNORMAL HIGH (ref 0.50–1.10)
GFR calc Af Amer: 29 mL/min — ABNORMAL LOW (ref >90)
GFR calc non Af Amer: 25 mL/min — ABNORMAL LOW (ref >90)
Glucose, Bld: 120 mg/dL — ABNORMAL HIGH (ref 70–99)
Potassium: 4.4 mmol/L (ref 3.5–5.1)
Sodium: 139 mmol/L (ref 135–145)

## 2014-04-19 MED ORDER — DARBEPOETIN ALFA 60 MCG/0.3ML IJ SOSY
60.0000 ug | PREFILLED_SYRINGE | Freq: Once | INTRAMUSCULAR | Status: AC
  Administered 2014-04-19: 60 ug via SUBCUTANEOUS
  Filled 2014-04-19: qty 0.3

## 2014-04-19 MED ORDER — DSS 100 MG PO CAPS: 100.0000 mg | ORAL_CAPSULE | Freq: Two times a day (BID) | ORAL | Status: DC

## 2014-04-19 MED ORDER — BISACODYL 10 MG RE SUPP: 10.0000 mg | Freq: Every day | RECTAL | Status: DC | PRN

## 2014-04-19 MED ORDER — BISACODYL 10 MG RE SUPP
10.0000 mg | Freq: Once | RECTAL | Status: DC
Start: 2014-04-19 — End: 2014-04-19

## 2014-04-19 MED ORDER — FUROSEMIDE 40 MG PO TABS: 40.0000 mg | ORAL_TABLET | Freq: Two times a day (BID) | ORAL | Status: DC

## 2014-04-19 MED ORDER — INSULIN ASPART 100 UNIT/ML ~~LOC~~ SOLN: 4.0000 [IU] | Freq: Three times a day (TID) | SUBCUTANEOUS | Status: DC

## 2014-04-19 MED ORDER — INSULIN LISPRO 100 UNIT/ML ~~LOC~~ SOLN: 4.0000 [IU] | Freq: Three times a day (TID) | SUBCUTANEOUS | Status: DC

## 2014-04-19 MED ORDER — LACTULOSE 10 GM/15ML PO SOLN
20.0000 g | Freq: Once | ORAL | Status: DC
  Filled 2014-04-19: qty 30

## 2014-04-19 MED ORDER — CARVEDILOL 6.25 MG PO TABS: 6.2500 mg | ORAL_TABLET | Freq: Two times a day (BID) | ORAL | Status: DC

## 2014-04-19 NOTE — Progress Notes (Signed)
Patient ID: Doris Howell, female   DOB: 1935/07/25, 79 y.o.   MRN: [id number]  Doris Howell Progress Note    Assessment/ Plan:   1. Chronic kidney disease stage IV: Solitary kidney with hypertensive nephrosclerosis-possible element of diabetic kidney disease. Component of acute renal failure improving with CHF management-good urine output on current doses of oral Lasix. I have set her up to follow up with me as an outpatient in one month following discharge  2. Acute hypoxic respiratory failure: Appears to be bi-factorial from CHF exacerbation/possible pulmonary process-on diuretic therapy (currently on hold) and prednisone.  3. Hypertension: Fairly controlled on current management with amlodipine and carvedilol.  4. Screening for metabolic bone disease: Phosphorus levels within acceptable limits, PTH level acceptable at 114.  5. Screening for anemia of chronic kidney disease: Iron stores are replete with an iron saturation of 34%. We'll give a single dose of subcutaneous Aranesp prior to discharge  Subjective:   Reports to be feeling better, ambulated hallways with minimal shortness of breath    Objective:   BP 134/49 mmHg  Pulse 61  Temp(Src) 98.2 F (36.8 C) (Oral)  Resp 18  Ht 5\' 1"  (1.549 m)  Wt 87.2 kg (192 lb 3.9 oz)  BMI 36.34 kg/m2  SpO2 98%  Intake/Output Summary (Last 24 hours) at 04/19/14 1021 Last data filed at 04/19/14 0800  Gross per 24 hour  Intake    960 ml  Output   2075 ml  Net  -1115 ml   Weight change: -1 kg (-2 lb 3.3 oz)  Physical Exam: Gen: Comfortably resting in bed CVS: Pulse regular in rate and rhythm Resp: Clear to auscultation bilaterally, no rales Abd: Soft, obese, nontender Ext: 1+ lower extremity edema  Imaging: Dg Chest 2 View  04/18/2014   CLINICAL DATA:  Nausea, congestive heart failure  EXAM: CHEST  2 VIEW  COMPARISON:  04/15/2013  FINDINGS: Cardiomediastinal silhouette is stable. No convincing pulmonary edema. Slight  improvement in aeration. Residual infiltrates right infrahilar and left upper lobe. Mild degenerative changes mid and lower thoracic spine.  IMPRESSION: Slight improvement in aeration. Residual infiltrates right infrahilar and left upper lobe. No pulmonary edema. No new infiltrates.   Electronically Signed   By: Lahoma Crocker M.D.   On: 04/18/2014 11:13    Labs: BMET  Recent Labs Lab 04/13/14 0400 04/14/14 0405 04/15/14 0408 04/16/14 0330 04/17/14 0548 04/18/14 0500 04/19/14 0406  NA 138 141 141 140 142 141 139  K 4.1 4.0 4.2 4.1 4.0 3.5 4.4  CL 103 106 104 104 104 98 99  CO2 27 26 28 24  32 32 36*  GLUCOSE 219* 218* 145* 225* 155* 112* 120*  BUN 51* 61* 70* 74* 69* 60* 47*  CREATININE 2.28* 2.23* 2.34* 2.34* 2.06* 2.00* 1.88*  CALCIUM 9.3 9.1 8.9 9.1 8.7 8.8 8.8  PHOS  --   --  4.7*  --   --   --   --    CBC  Recent Labs Lab 04/14/14 0405 04/15/14 0408 04/16/14 0330 04/17/14 0548  WBC 10.4 10.3 9.8 10.2  HGB 8.3* 8.1* 8.7* 8.3*  HCT 26.3* 25.7* 27.1* 25.7*  MCV 89.5 89.9 91.6 89.9  PLT 271 278 273 260    Medications:    . allopurinol  100 mg Oral BID  . amLODipine  10 mg Oral q morning - 10a  . aspirin EC  81 mg Oral Daily  . carvedilol  6.25 mg Oral BID WC  . docusate  sodium  100 mg Oral BID  . furosemide  40 mg Oral BID  . heparin  5,000 Units Subcutaneous 3 times per day  . insulin aspart  0-15 Units Subcutaneous TID WC  . insulin aspart  0-5 Units Subcutaneous QHS  . insulin aspart  4 Units Subcutaneous TID WC  . latanoprost  1 drop Both Eyes QHS  . polyethylene glycol  17 g Oral Daily  . simvastatin  20 mg Oral QPM  . sodium chloride  3 mL Intravenous Q12H   Elmarie Shiley, MD 04/19/2014, 10:21 AM

## 2014-04-19 NOTE — Discharge Summary (Signed)
Physician Discharge Summary  Doris Howell [id number] DOB: 1936/03/27 DOA: 04/10/2014  PCP: Doris Shelling, MD  Admit date: 04/10/2014 Discharge date: 04/19/2014  Time spent: 35 minutes  Recommendations for Outpatient Follow-up:  1. Please follow up on a BMP in 2-3 days 2. Home Health Services were set for Home PT and RN    Discharge Diagnoses:  Principal Problem:   Diastolic CHF, acute on chronic Active Problems:   CKD (chronic kidney disease), stage III   HCAP (healthcare-associated pneumonia)   Diabetes mellitus   Essential hypertension   Gout   SOB (shortness of breath)   Obesity (BMI 37)   Bradycardia- beta blocker decreased   Pulmonary infiltrates   Discharge Condition: Stable/Improved  Diet recommendation: Heart Healthy  Filed Weights   04/17/14 0555 04/18/14 0508 04/19/14 0545  Weight: 90.901 kg (200 lb 6.4 oz) 88.2 kg (194 lb 7.1 oz) 87.2 kg (192 lb 3.9 oz)    History of present illness:  Doris Howell is a 79 y.o. female with Past medical history of hypertension, dyslipidemia, diabetes mellitus, GERD, gout. The patient presented with complaints of shortness of breath that has been ongoing since last 4-5 days and progressively worsening. Patient mentions she has shortness of breath on rest as well as on exertion. She also has more shortness of breath when she is lying down flat. She complains of waking up in the middle of the night for air as last 3 days. She mentions she is compliant with all her medications and is taking Lasix daily. She denies any burning urination or diarrhea or constipation. She denies any nausea but did have an episode of vomiting a few days ago. She denies any acid reflux. Does not have any complaint of headache or dizziness or lightheadedness or focal deficit. She is exposed to secondhand smoke. She also complains of right upper quadrant pain which is going on her back and feels like a sharp pain. She was seen in the  ER 4 days ago with this complaint and CT of the abdomen was negative and she was sent home on tramadol.  Hospital Course:  79 year old female patient with history of HTN, HLD, DM 2, GERD, gout, CKD, anemia, OSA, presented with acute/subacute onset of dyspnea and hypoxia. Lifelong nonsmoker but exposed to secondhand smoke exposure spouse. This is her third admission in the last 3 months for similar presentation. Assessed to have acute on chronic diastolic CHF. Improved with diuretics. Cardiology felt that her presentation was more in keeping with a pulmonary etiology. Pulmonary consulted on 04/13/14 and felt all her presentation was primarily cardiac including previous admission.  1. Acute respiratory failure with hypoxia: Most likely secondary to acute diastolic CHF. She was diuresed with IV Lasix and has clinically improved. VQ scan during recent admission was negative. As per cardiology and pulmonary follow-up, agree her presentation is most consistent with decompensated CHF rather than infectious etiology i.e. pneumonia. Hence antibiotics discontinued 04/14/13. By 04/19/2014 she reported feeling significantly better. 2. Acute on chronic diastolic CHF: She had a Right Heart Cath performed on 04/15/2014 that showed biventricular failure with preserved ejection fraction. By day of discharge she had a net negative fluid balance of 9.634 Liters and her weight coming down to 87.2 from 97.8, after IV diuresis. She was feeling significantly better by the day of discharge. Cardiology recommending discharging her on Lasix 40 mg PO BID. Home Health services were set up, will need a repeat BMP in 2-3 days.  3. Bradycardia: Coreg was reduced  to 6.25 MG twice a day. Bradycardia resolved. 4. Mildly elevated troponin: Likely secondary to demand ischemia from acute respiratory failure and acute diastolic CHF. As per cardiology, outpatient stress test since LHC carries high risk of contrast nephropathy and pushing her towards  HD. 5. Essential hypertension: Controlled. Continue amlodipine and carvedilol. 6. Hyperlipidemia: Continue simvastatin 7. Uncontrolled DM 2: Will discharge on Novolog 4 units [place] with meals. Oral hypoglycemics were stopped due to renal failure. Please follow up on blood sugars.  8. Stage 4 chronic kidney disease: Baseline creatinine probably in the range of 1.9-2.2. Creatinine has slightly worsened during this hospitalization for which nephrology was consulted. On day of discharge her Creatinine was 1.88.   9. Chronic anemia: Likely secondary to chronic kidney disease. Gradual drift down of hemoglobin. Follow CBCs.  Procedures:  Right Heart Cath performed on 04/15/2014  Consultations:  Cardiology  Nephrology  Discharge Exam: Filed Vitals:   04/19/14 0545  BP: 134/49  Pulse: 61  Temp: 98.2 F (36.8 C)  Resp: 18   General exam: NAD, ambulated down the hallway Respiratory system: Diminished breath sounds in the bases. Rest of lung fields clear to auscultation. No increased work of breathing. No wheezing or rhonchi. Cardiovascular system: S1 & S2 heard, RRR. No JVD, murmurs, gallops, clicks or pedal edema. Telemetry: Sinus rhythm. Gastrointestinal system: Abdomen is nondistended, soft and nontender. Normal bowel sounds heard. Central nervous system: Alert and oriented. No focal neurological deficits. Extremities: Symmetric 5 x 5 power.   Discharge Instructions   Discharge Instructions    (HEART FAILURE PATIENTS) Call MD:  Anytime you have any of the following symptoms: 1) 3 pound weight gain in 24 hours or 5 pounds in 1 week 2) shortness of breath, with or without a dry hacking cough 3) swelling in the hands, feet or stomach 4) if you have to sleep on extra pillows at night in order to breathe.    Complete by:  As directed      Call MD for:  difficulty breathing, headache or visual disturbances    Complete by:  As directed      Call MD for:  extreme fatigue    Complete by:  As  directed      Call MD for:  hives    Complete by:  As directed      Call MD for:  persistant dizziness or light-headedness    Complete by:  As directed      Call MD for:  persistant nausea and vomiting    Complete by:  As directed      Call MD for:  redness, tenderness, or signs of infection (pain, swelling, redness, odor or green/yellow discharge around incision site)    Complete by:  As directed      Call MD for:  severe uncontrolled pain    Complete by:  As directed      Call MD for:  temperature >100.4    Complete by:  As directed      Diet - low sodium heart healthy    Complete by:  As directed      Increase activity slowly    Complete by:  As directed           Current Discharge Medication List    START taking these medications   Details  bisacodyl (DULCOLAX) 10 MG suppository Place 1 suppository (10 mg total) rectally daily as needed for moderate constipation. Qty: 12 suppository, Refills: 0    docusate sodium 100  MG CAPS Take 100 mg by mouth 2 (two) times daily. Qty: 10 capsule, Refills: 0    insulin lispro (HUMALOG) 100 UNIT/ML injection Inject 0.04 mLs (4 Units total) into the skin 3 (three) times daily before meals. Qty: 10 mL, Refills: 11      CONTINUE these medications which have CHANGED   Details  carvedilol (COREG) 6.25 MG tablet Take 1 tablet (6.25 mg total) by mouth 2 (two) times daily with a meal. Qty: 60 tablet, Refills: 1    furosemide (LASIX) 40 MG tablet Take 1 tablet (40 mg total) by mouth 2 (two) times daily. Qty: 60 tablet, Refills: 1      CONTINUE these medications which have NOT CHANGED   Details  albuterol (PROVENTIL HFA;VENTOLIN HFA) 108 (90 BASE) MCG/ACT inhaler Inhale 2 puffs into the lungs every 6 (six) hours as needed for wheezing or shortness of breath. Qty: 1 Inhaler, Refills: 0    allopurinol (ZYLOPRIM) 100 MG tablet Take 100 mg by mouth 2 (two) times daily.    amLODipine (NORVASC) 10 MG tablet Take 10 mg by mouth every morning.      aspirin EC 81 MG tablet Take 81 mg by mouth daily.    bimatoprost (LUMIGAN) 0.01 % SOLN Place 1 drop into both eyes at bedtime.     cholecalciferol (VITAMIN D) 1000 UNITS tablet Take 1,000 Units by mouth daily.    ondansetron (ZOFRAN ODT) 4 MG disintegrating tablet Take 1 tablet (4 mg total) by mouth every 8 (eight) hours as needed for nausea or vomiting. Qty: 12 tablet, Refills: 0    simvastatin (ZOCOR) 20 MG tablet Take 20 mg by mouth every evening.    traMADol (ULTRAM) 50 MG tablet Take 1 tablet (50 mg total) by mouth every 6 (six) hours as needed. Qty: 12 tablet, Refills: 0      STOP taking these medications     glimepiride (AMARYL) 1 MG tablet      levofloxacin (LEVAQUIN) 750 MG tablet        Allergies  Allergen Reactions  . Codeine Other (See Comments)    Unknown; pt can't remember. It was in the '70s   Follow-up Information    Follow up with Ulla Potash., MD On 05/14/2014.   Specialty:  Nephrology   Why:  at 9:30AM   Contact information:   [address] [place] [address] [phone]       Follow up with Darlin Coco, MD In 1 week.   Specialty:  Cardiology   Contact information:   [address] [place] [address] [phone]       Follow up with Doris Shelling, MD In 1 week.   Specialty:  Internal Medicine   Contact information:   [address], [address] [place] [address] [phone]        The results of significant diagnostics from this hospitalization (including imaging, microbiology, ancillary and laboratory) are listed below for reference.    Significant Diagnostic Studies: Ct Abdomen Pelvis Wo Contrast  04/05/2014   CLINICAL DATA:  Right-sided flank pain with history of stones.  EXAM: CT ABDOMEN AND PELVIS WITHOUT CONTRAST  TECHNIQUE: Multidetector CT imaging of the abdomen and pelvis was performed following the standard protocol without IV contrast.  COMPARISON:  MRI abdomen 03/26/2014  FINDINGS:  Lower chest:  Mild basilar atelectasis.  The heart is enlarged.  Hepatobiliary: No focal hepatic lesions non contrast exam. Gallbladder is normal.  Pancreas: Pancreas is normal. No ductal dilatation.  No pancreatic inflammation.  Spleen: Normal spleen.  Adrenals/urinary tract: Adrenal glands are normal. Post left nephrectomy. No nodularity in nephrectomy bed. Right kidney is normal. The right ureter and bladder are normal.  Stomach/Bowel: The stomach, small bowel, and cecum are normal. Colon and rectosigmoid colon are normal.  Vascular/Lymphatic: Abdominal aorta is normal caliber. There is no retroperitoneal or periportal lymphadenopathy. No pelvic lymphadenopathy.  Reproductive: Post hysterectomy anatomy. No free fluid the pelvis. No pelvic lymphadenopathy.  Musculoskeletal: No aggressive osseous lesion.  Other: No free fluid.  Inflammation.  IMPRESSION: 1. Normal right kidney on noncontrast exam. No ureterolithiasis or nephrolithiasis. Bladder appears normal. 2. Left nephrectomy without complication. 3. Post hysterectomy   Electronically Signed   By: Suzy Bouchard M.D.   On: 04/05/2014 22:06   Dg Chest 2 View  04/18/2014   CLINICAL DATA:  Nausea, congestive heart failure  EXAM: CHEST  2 VIEW  COMPARISON:  04/15/2013  FINDINGS: Cardiomediastinal silhouette is stable. No convincing pulmonary edema. Slight improvement in aeration. Residual infiltrates right infrahilar and left upper lobe. Mild degenerative changes mid and lower thoracic spine.  IMPRESSION: Slight improvement in aeration. Residual infiltrates right infrahilar and left upper lobe. No pulmonary edema. No new infiltrates.   Electronically Signed   By: Lahoma Crocker M.D.   On: 04/18/2014 11:13   Dg Chest 2 View  04/13/2014   CLINICAL DATA:  Shortness of breath, abnormal chest x-ray.  EXAM: CHEST  2 VIEW  COMPARISON:  February 10, 2015.  FINDINGS: Stable cardiomediastinal silhouette. Right upper lobe opacity noted on prior exam is improved although  residual density remains. Left upper lobe and perihilar opacity is unchanged concerning for pneumonia. No pneumothorax or significant pleural effusion is noted. Bony thorax is intact.  IMPRESSION: Persistent left upper lobe and perihilar opacity is noted most consistent with pneumonia. Significantly improved right upper lobe pneumonia compared to prior exam.   Electronically Signed   By: Sabino Dick M.D.   On: 04/13/2014 18:38   Dg Chest 2 View  04/11/2014   CLINICAL DATA:  Shortness of breath  EXAM: CHEST  2 VIEW  COMPARISON:  02/08/2014  FINDINGS: Dense irregularly-shaped opacities in the upper lungs which are new. Trace pleural effusions. Chronic moderate cardiomegaly and aortic tortuosity.  IMPRESSION: Biapical airspace is primarily concerning for pneumonia, but a prior episode of CHF had a similar appearance. As well, there are small pleural effusions. Suggest follow-up after diuresis.   Electronically Signed   By: Jorje Guild M.D.   On: 04/11/2014 00:44   Mr Abdomen Wo Contrast  03/26/2014   CLINICAL DATA:  Evaluate pancreas cyst  EXAM: MRI ABDOMEN WITHOUT CONTRAST  TECHNIQUE: Multiplanar multisequence MR imaging was performed without the administration of intravenous contrast.  COMPARISON:  10/29/2013  FINDINGS: Lower chest: There is no pleural or pericardial effusion identified. The heart size appears moderately enlarged.  Hepatobiliary: On the diffusion weighted images the previously described hypervascular liver lesions within the left hepatic lobe are identified and appears stable measuring 1 cm, image number 18/series 8. T2 hyperintense structure in the left hepatic lobe is unchanged , image number 9/series 7.  Pancreas: Numerous unilocular, T2 hyperintense structures are identified within the pancreas. The largest is in the body of pancreas measuring 1.4 cm, image 17/series 6. This is unchanged from previous exam the index lesion within the tail of pancreas is also stable measuring 1.1 cm,  image 11/series 6. Several T2 hyperintense structures are noted within the uncinate process. The largest measures 1.1  cm, image 13/series 6. These are unchanged from previous exam.  Spleen: The spleen is normal.  Adrenals/Urinary Tract: The adrenal glands are both normal. Status post left nephrectomy. Normal appearance of the right kidney.  Stomach/Bowel: The visualized upper abdominal bowel loops and stomach appear within normal limits.  Vascular/Lymphatic: Normal caliber of the abdominal aorta. No adenopathy identified within the upper abdomen.  Other: No free fluid or fluid collections within the upper abdomen.  Musculoskeletal: No abnormal areas of signal from within the bone marrow.  IMPRESSION: 1. Overall stable exam compared with 10/29/2013. 2. Although performed to without IV contrast material the previously described hypervascular liver lesions (identified on diffusion weighted sequences) are unchanged from previous exam favoring a benign abnormality. 3. Cysts within the liver and pancreas are stable from previous exam. A single followup examination at 12 months (10/29/2013) is recommended to confirm stability.   Electronically Signed   By: Kerby Moors M.D.   On: 03/26/2014 12:09   Dg Chest Port 1 View  04/15/2014   CLINICAL DATA:  Shortness of breath, pulmonary infiltrates  EXAM: PORTABLE CHEST - 1 VIEW  COMPARISON:  04/13/2014  FINDINGS: Cardiomediastinal silhouette is stable. Persistent left upper lobe and left perihilar infiltrates. New infiltrates right perihilar and right infrahilar region. Question small left pleural effusion. Findings suspicious for worsening multifocal pneumonia rather than pulmonary edema.  IMPRESSION: Persistent left upper lobe and left perihilar infiltrates. New infiltrates right perihilar and right infrahilar region. Question small left pleural effusion. Findings suspicious for worsening multifocal pneumonia rather than pulmonary edema.   Electronically Signed   By: Lahoma Crocker M.D.   On: 04/15/2014 08:08    Microbiology: No results found for this or any previous visit (from the past 240 hour(s)).   Labs: Basic Metabolic Panel:  Recent Labs Lab 04/15/14 0408 04/16/14 0330 04/17/14 0548 04/18/14 0500 04/19/14 0406  NA 141 140 142 141 139  K 4.2 4.1 4.0 3.5 4.4  CL 104 104 104 98 99  CO2 28 24 32 32 36*  GLUCOSE 145* 225* 155* 112* 120*  BUN 70* 74* 69* 60* 47*  CREATININE 2.34* 2.34* 2.06* 2.00* 1.88*  CALCIUM 8.9 9.1 8.7 8.8 8.8  PHOS 4.7*  --   --   --   --    Liver Function Tests:  Recent Labs Lab 04/15/14 0408  ALBUMIN 2.8*   No results for input(s): LIPASE, AMYLASE in the last 168 hours. No results for input(s): AMMONIA in the last 168 hours. CBC:  Recent Labs Lab 04/14/14 0405 04/15/14 0408 04/16/14 0330 04/17/14 0548  WBC 10.4 10.3 9.8 10.2  HGB 8.3* 8.1* 8.7* 8.3*  HCT 26.3* 25.7* 27.1* 25.7*  MCV 89.5 89.9 91.6 89.9  PLT 271 278 273 260   Cardiac Enzymes: No results for input(s): CKTOTAL, CKMB, CKMBINDEX, TROPONINI in the last 168 hours. BNP: BNP (last 3 results)  Recent Labs  01/09/14 1515 02/05/14 1216  PROBNP 2595.0* 2767.0*   CBG:  Recent Labs Lab 04/18/14 0603 04/18/14 1120 04/18/14 1619 04/18/14 2151 04/19/14 0627  GLUCAP 111* 181* 185* 177* 110*       Signed:  Seana Underwood  [facility] 04/19/2014, 11:14 AM

## 2014-04-19 NOTE — Progress Notes (Signed)
Pt c/o no bm since Wednesday. Refused three types of laxative offered. Will take at home

## 2014-04-20 LAB — PARATHYROID HORMONE, INTACT (NO CA): PTH: 74 pg/mL — ABNORMAL HIGH (ref 15–65)

## 2014-04-28 ENCOUNTER — Telehealth: Payer: Self-pay | Admitting: Cardiology

## 2014-04-28 NOTE — Telephone Encounter (Signed)
Pt was seen at [facility] for heart failure & per discharge needed a 1 week follow-up with Dr. Mare Ferrari. States she was seen by Dr. Mare Ferrari. Appointment given to pt for 04/29/14 at 3:30pm after talking with Hardie Shackleton RN

## 2014-04-28 NOTE — Telephone Encounter (Signed)
New Message  Pt called to make f/u appt with Brackbill in one week- confirmed by Dx notes.  Pt is aware  That Dr. Mare Ferrari had no appts open in that timeframe, and was offered an appt w/ a PA the same day Brackbill was in the office.  Pt only wanted Brackbill and requested to speak w/ Rn. Please call back and discuss.

## 2014-04-29 ENCOUNTER — Encounter: Payer: Self-pay | Admitting: Cardiology

## 2014-04-29 ENCOUNTER — Ambulatory Visit (INDEPENDENT_AMBULATORY_CARE_PROVIDER_SITE_OTHER): Payer: Medicare Other | Admitting: Cardiology

## 2014-04-29 VITALS — BP 124/72 | HR 61 | Ht 61.0 in | Wt 181.0 lb

## 2014-04-29 DIAGNOSIS — N183 Chronic kidney disease, stage 3 unspecified: Secondary | ICD-10-CM

## 2014-04-29 DIAGNOSIS — I509 Heart failure, unspecified: Secondary | ICD-10-CM

## 2014-04-29 DIAGNOSIS — R0600 Dyspnea, unspecified: Secondary | ICD-10-CM

## 2014-04-29 DIAGNOSIS — R0609 Other forms of dyspnea: Secondary | ICD-10-CM

## 2014-04-29 DIAGNOSIS — I5082 Biventricular heart failure: Secondary | ICD-10-CM

## 2014-04-29 NOTE — Progress Notes (Signed)
Cardiology Office Note   Date:  04/29/2014   ID:  Doris Howell, DOB 07-16-1935, MRN [id number]  PCP:  Irven Shelling, MD  Cardiologist:   Darlin Coco, MD   No chief complaint on file.     History of Present Illness: Doris Howell is a 79 y.o. female who presents for follow-up after recent hospitalization from 04/11/14 until 04/19/14 for exacerbation of chronic diastolic heart failure.  During that admission she had a right heart catheterization on 04/15/14 and she was found to have a pulmonary artery pressure of 64/25 with a mean of 40.  The right ventricular end-diastolic pressure was 15.  The impression was biventricular heart failure with preserved left ventricular ejection fraction and moderate pulmonary artery hypertension.  The patient responded well to diuresis.  Since being discharged her breathing has improved.  Her weight has been stable at home.  She sleeps on 2 pillows and the head of her bed is elevated on bricks.  She is not having any paroxysmal nocturnal dyspnea.  The patient has severe kidney disease and left heart catheterization was avoided because of her renal insufficiency.    Past Medical History  Diagnosis Date  . Hypertension   . High cholesterol   . Kidney stones   . Type II diabetes mellitus   . GERD (gastroesophageal reflux disease)   . History of gout   . CAP (community acquired pneumonia) 01/09/2014    "first time I've ever had it"  . Diastolic congestive heart failure     Past Surgical History  Procedure Laterality Date  . Kidney stone surgery  10-05-12    "cut me on the side"  . Colonoscopy with propofol N/A 10/23/2012    Procedure: COLONOSCOPY WITH PROPOFOL;  Surgeon: [name], MD;  Location: [facility];  Service: Endoscopy;  Laterality: N/A;  . Abdominal hysterectomy  ~ 1974  . Lithotripsy  1980's?  . Cataract extraction w/ intraocular lens  implant, bilateral Bilateral 2014  . Right heart catheterization N/A  04/15/2014    Procedure: RIGHT HEART CATH;  Surgeon: Sanda Klein, MD;  Location: [facility] CATH LAB;  Service: Cardiovascular;  Laterality: N/A;     Current Outpatient Prescriptions  Medication Sig Dispense Refill  . albuterol (PROVENTIL HFA;VENTOLIN HFA) 108 (90 BASE) MCG/ACT inhaler Inhale 2 puffs into the lungs every 6 (six) hours as needed for wheezing or shortness of breath. 1 Inhaler 0  . allopurinol (ZYLOPRIM) 100 MG tablet Take 100 mg by mouth 2 (two) times daily.    [name] amLODipine (NORVASC) 10 MG tablet Take 10 mg by mouth every morning.     [name] aspirin EC 81 MG tablet Take 81 mg by mouth daily.    . bimatoprost (LUMIGAN) 0.01 % SOLN Place 1 drop into both eyes at bedtime.     . bisacodyl (DULCOLAX) 10 MG suppository Place 1 suppository (10 mg total) rectally daily as needed for moderate constipation. 12 suppository 0  . carvedilol (COREG) 6.25 MG tablet Take 1 tablet (6.25 mg total) by mouth 2 (two) times daily with a meal. 60 tablet 1  . cholecalciferol (VITAMIN D) 1000 UNITS tablet Take 1,000 Units by mouth daily.    [name] docusate sodium 100 MG CAPS Take 100 mg by mouth 2 (two) times daily. 10 capsule 0  . furosemide (LASIX) 40 MG tablet Take 1 tablet (40 mg total) by mouth 2 (two) times daily. 60 tablet 1  . ondansetron (ZOFRAN ODT) 4 MG disintegrating tablet Take 1 tablet (  4 mg total) by mouth every 8 (eight) hours as needed for nausea or vomiting. 12 tablet 0  . traMADol (ULTRAM) 50 MG tablet Take 1 tablet (50 mg total) by mouth every 6 (six) hours as needed. (Patient taking differently: Take 50 mg by mouth every 6 (six) hours as needed for moderate pain or severe pain. ) 12 tablet 0   No current facility-administered medications for this visit.    Allergies:   Codeine    Social History:  The patient  reports that she has never smoked. She has never used smokeless tobacco. She reports that she does not drink alcohol or use illicit drugs.   Family History:  The patient's family history  includes Hypertension in her mother. There is no history of CAD.    ROS:  Please see the history of present illness.   Otherwise, review of systems are positive for none.   All other systems are reviewed and negative.    PHYSICAL EXAM: VS:  BP 124/72 mmHg  Pulse 61  Ht 5\' 1"  (1.549 m)  Wt 181 lb (82.101 kg)  BMI 34.22 kg/m2 , BMI Body mass index is 34.22 kg/(m^2). GEN: Well nourished, well developed, in no acute distress HEENT: normal Neck: no JVD, carotid bruits, or masses Cardiac: RRR; no murmurs, rubs, or gallops,no edema  Respiratory:  clear to auscultation bilaterally, normal work of breathing GI: soft, nontender, nondistended, + BS MS: no deformity or atrophy Skin: warm and dry, no rash Neuro:  Strength and sensation are intact Psych: euthymic mood, full affect   EKG:  Not done today.   Recent Labs: 01/09/2014: TSH 2.440 02/05/2014: Pro B Natriuretic peptide (BNP) 2767.0* 04/11/2014: ALT 18 04/15/2014: B Natriuretic Peptide 591.0* 04/17/2014: Hemoglobin 8.3*; Platelets 260 04/19/2014: BUN 47*; Creatinine 1.88*; Potassium 4.4; Sodium 139    Lipid Panel No results found for: CHOL, TRIG, HDL, CHOLHDL, VLDL, LDLCALC, LDLDIRECT    Wt Readings from Last 3 Encounters:  04/29/14 181 lb (82.101 kg)  04/19/14 192 lb 3.9 oz (87.2 kg)  02/11/14 200 lb 1.6 oz (90.765 kg)      Other studies Reviewed: Additional studies/ records that were reviewed today include: EKG from 04/20/14 was reviewed and shows normal sinus rhythm with bigeminy PVCs and nonspecific ST-T wave changes and possible old anterior wall myocardial infarction. Review of the above records demonstrates: Two-dimensional cardiogram in October 2015 reveals LV EF: 50% -  55%  ------------------------------------------------------------------- Indications:   Respiratory Failure acute 518.81.  ------------------------------------------------------------------- History:  Risk factors: Leg  edema.  ------------------------------------------------------------------- Study Conclusions  - Left ventricle: The cavity size was normal. Wall thickness was increased in a pattern of mild LVH. Systolic function was normal. The estimated ejection fraction was in the range of 50% to 55%. - Left atrium: The atrium was mildly dilated. - Pulmonary arteries: PA peak pressure: 32 mm Hg (S). ASSESSMENT AND PLAN:  1.  Biventricular congestive heart failure, improved.  Ejection fraction 50% by echo. 2.  Renal insufficiency stage 3 3.  Anemia of chronic disease  Current medicines are reviewed at length with the patient today.  The patient does not have concerns regarding medicines.  The following changes have been made:  no change  Labs/ tests ordered today include: Lexiscan Myoview   No orders of the defined types were placed in this encounter.     Disposition:   FU with Dr. Mare Ferrari in 3 months   Signed, Darlin Coco, MD  04/29/2014 3:53 PM    [facility] [place], [place], [place]  [address] Phone: [phone]; Fax: [phone]

## 2014-04-29 NOTE — Patient Instructions (Signed)
Your physician recommends that you continue on your current medications as directed. Please refer to the Current Medication list given to you today.  Your physician recommends that you schedule a follow-up appointment in: 3 month ov  Your physician has requested that you have a lexiscan myoview. For further information please visit [email or web address]. Please follow instruction sheet, as given.

## 2014-05-05 ENCOUNTER — Encounter: Payer: Self-pay | Admitting: Cardiology

## 2014-05-05 ENCOUNTER — Ambulatory Visit (HOSPITAL_COMMUNITY): Payer: Medicare Other | Attending: Cardiology | Admitting: Radiology

## 2014-05-05 VITALS — BP 129/72 | HR 77 | Ht 61.0 in | Wt 189.0 lb

## 2014-05-05 DIAGNOSIS — E119 Type 2 diabetes mellitus without complications: Secondary | ICD-10-CM | POA: Insufficient documentation

## 2014-05-05 DIAGNOSIS — I5031 Acute diastolic (congestive) heart failure: Secondary | ICD-10-CM

## 2014-05-05 DIAGNOSIS — I5082 Biventricular heart failure: Secondary | ICD-10-CM

## 2014-05-05 DIAGNOSIS — R0609 Other forms of dyspnea: Secondary | ICD-10-CM

## 2014-05-05 DIAGNOSIS — E785 Hyperlipidemia, unspecified: Secondary | ICD-10-CM | POA: Insufficient documentation

## 2014-05-05 DIAGNOSIS — R0602 Shortness of breath: Secondary | ICD-10-CM | POA: Diagnosis not present

## 2014-05-05 DIAGNOSIS — R110 Nausea: Secondary | ICD-10-CM

## 2014-05-05 DIAGNOSIS — I10 Essential (primary) hypertension: Secondary | ICD-10-CM | POA: Insufficient documentation

## 2014-05-05 MED ORDER — TECHNETIUM TC 99M SESTAMIBI GENERIC - CARDIOLITE
30.0000 | Freq: Once | INTRAVENOUS | Status: AC | PRN
  Administered 2014-05-05: 30 via INTRAVENOUS

## 2014-05-05 MED ORDER — AMINOPHYLLINE 25 MG/ML IV SOLN
75.0000 mg | Freq: Once | INTRAVENOUS | Status: AC
Start: 2014-05-05 — End: 2014-05-05
  Administered 2014-05-05: 75 mg via INTRAVENOUS

## 2014-05-05 MED ORDER — TECHNETIUM TC 99M SESTAMIBI GENERIC - CARDIOLITE
10.0000 | Freq: Once | INTRAVENOUS | Status: AC | PRN
  Administered 2014-05-05: 10 via INTRAVENOUS

## 2014-05-05 MED ORDER — REGADENOSON 0.4 MG/5ML IV SOLN
0.4000 mg | Freq: Once | INTRAVENOUS | Status: AC
  Administered 2014-05-05: 0.4 mg via INTRAVENOUS

## 2014-05-05 NOTE — Progress Notes (Signed)
[facility] [address] [place], [place] [address] [phone]    Cardiology Nuclear Med Study  Doris Howell is a 79 y.o. female     MRN : [id number]     DOB: 1935/12/24  Procedure Date: 05/05/2014  Nuclear Med Background Indication for Stress Test:  Evaluation for Ischemia History:  No known CAD Cardiac Risk Factors: Hypertension, Lipids and NIDDM  Symptoms:  DOE and SOB   Nuclear Pre-Procedure Caffeine/Decaff Intake:  None NPO After: 6:00am   Lungs:  clear O2 Sat: 95% on room air. IV 0.9% NS with Angio Cath:  22g  IV Site: R Antecubital  IV Started by:  Matilde Haymaker, RN  Chest Size (in):  42 Cup Size: B  Height: 5\' 1"  (1.549 m)  Weight:  189 lb (85.73 kg)  BMI:  Body mass index is 35.73 kg/(m^2). Tech Comments:  Coreg taken @ 0700    Nuclear Med Study 1 or 2 day study: 1 day  Stress Test Type:  Carlton Adam  Reading MD: n/a  Order Authorizing Provider:  Henrene Dodge  Resting Radionuclide: Technetium 44m Sestamibi  Resting Radionuclide Dose: 11. 72mCi   Stress Radionuclide:  Technetium 41m Sestamibi  Stress Radionuclide Dose: 33.0 mCi           Stress Protocol Rest HR: 77 Stress HR: 88  Rest BP: 129/72 Stress BP: 117/74  Exercise Time (min): n/a METS: n/a   Predicted Max HR: 144 bpm % Max HR: 61.11 bpm Rate Pressure Product: 11616   Dose of Adenosine (mg):  n/a Dose of Lexiscan: 0.4 mg  Dose of Atropine (mg): n/a Dose of Dobutamine: n/a mcg/kg/min (at max HR)  Stress Test Technologist: Glade Lloyd, BS-ES  Nuclear Technologist:  Earl Many, CNMT     Rest Procedure:  Myocardial perfusion imaging was performed at rest 45 minutes following the intravenous administration of Technetium 4m Sestamibi. Rest ECG: Normal sinus rhythm. Nonspecific ST changes. Scattered PVCs.  Stress Procedure:  The patient received IV Lexiscan 0.4 mg over 15-seconds.  Technetium 52m Sestamibi injected at 30-seconds.  Quantitative spect  images were obtained after a 45 minute delay.  During the infusion of Lexiscan the patient complained of SOB and nausea.  The nausea continued so 75 mg aminophylline was given and the patient began to feel better.  Stress ECG: No significant change from baseline ECG  QPS Raw Data Images:  Normal; no motion artifact; normal heart/lung ratio. Stress Images:  Normal homogeneous uptake in all areas of the myocardium. Rest Images:  Normal homogeneous uptake in all areas of the myocardium. Subtraction (SDS):  No evidence of ischemia. Transient Ischemic Dilatation (Normal <1.22):  1.00 Lung/Heart Ratio (Normal <0.45):  0.28  Quantitative Gated Spect Images QGS EDV:  124 ml QGS ESV:  69 ml  Impression Exercise Capacity:  Lexiscan with no exercise. BP Response:  Normal blood pressure response. Clinical Symptoms:  Shortness of breath. Nausea. Patient given 75 mg of Aminophyllin for nausea ECG Impression:  No significant ST segment change suggestive of ischemia. Comparison with Prior Nuclear Study: This study is compared with the report of the study from October, 2002.   Overall Impression:  This is a low risk scan. There is no scar or ischemia. There is mild decreased ejection fraction. The findings are similar to those noted in the report from 2002. At that time the ejection fraction was computed at 43%.  LV Ejection Fraction: 44%.  LV Wall Motion:  There is mild  global hypokinesis.  Dola Argyle, MD

## 2014-05-07 ENCOUNTER — Telehealth: Payer: Self-pay | Admitting: *Deleted

## 2014-05-07 NOTE — Telephone Encounter (Signed)
Advised patient

## 2014-05-07 NOTE — Telephone Encounter (Signed)
-----   Message from Darlin Coco, MD sent at 05/06/2014  8:32 AM EST ----- Please report.  The myoview was satisfactory. No scar or ischemia. Continue current meds. Send copy to Dr. Lavone Orn.

## 2014-07-17 ENCOUNTER — Encounter: Payer: Self-pay | Admitting: Cardiology

## 2014-07-17 ENCOUNTER — Ambulatory Visit (INDEPENDENT_AMBULATORY_CARE_PROVIDER_SITE_OTHER): Payer: Medicare Other | Admitting: Cardiology

## 2014-07-17 VITALS — BP 140/72 | HR 68 | Ht 61.0 in | Wt 194.0 lb

## 2014-07-17 DIAGNOSIS — I509 Heart failure, unspecified: Secondary | ICD-10-CM

## 2014-07-17 DIAGNOSIS — R0609 Other forms of dyspnea: Secondary | ICD-10-CM | POA: Diagnosis not present

## 2014-07-17 DIAGNOSIS — N183 Chronic kidney disease, stage 3 unspecified: Secondary | ICD-10-CM

## 2014-07-17 DIAGNOSIS — R0600 Dyspnea, unspecified: Secondary | ICD-10-CM

## 2014-07-17 DIAGNOSIS — I5082 Biventricular heart failure: Secondary | ICD-10-CM

## 2014-07-17 MED ORDER — CARVEDILOL 12.5 MG PO TABS: 12.5000 mg | ORAL_TABLET | Freq: Two times a day (BID) | ORAL | Status: DC

## 2014-07-17 NOTE — Patient Instructions (Signed)
Medication Instructions:  INCREASE YOU CARVEDILOL TO 12.5 MG TWICE A DAY   Labwork: NONE  Testing/Procedures: NONE  Follow-Up: Your physician wants you to follow-up in: 4 MONTH OV/EKG You will receive a reminder letter in the mail two months in advance. If you don't receive a letter, please call our office to schedule the follow-up appointment.   Any Other Special Instructions Will Be Listed Below (If Applicable). NONE

## 2014-07-17 NOTE — Progress Notes (Signed)
Cardiology Office Note   Date:  07/17/2014   ID:  Doris Howell, DOB 10/07/1935, MRN [id number]  PCP:  Irven Shelling, MD  Cardiologist:   Darlin Coco, MD   Chief Complaint  Patient presents with  . Follow-up    heart issues      History of Present Illness: Doris Howell is a 79 y.o. female who presents for follow-up office visit  Doris Howell is a 79 y.o. female who presents for follow-up after recent hospitalization from 04/11/14 until 04/19/14 for exacerbation of chronic diastolic heart failure. During that admission she had a right heart catheterization on 04/15/14 and she was found to have a pulmonary artery pressure of 64/25 with a mean of 40. The right ventricular end-diastolic pressure was 15. The impression was biventricular heart failure with preserved left ventricular ejection fraction and moderate pulmonary artery hypertension. The patient responded well to diuresis. Since being discharged her breathing has improved. Her weight has been stable at home. She sleeps on 2 pillows and the head of her bed is elevated on bricks. She is not having any paroxysmal nocturnal dyspnea. The patient has severe kidney disease and left heart catheterization was avoided because of her renal insufficiency. Since last visit she has been doing reasonably well.  She had a Lexiscan Myoview on 05/05/14 which showed left ventricular systolic dysfunction with ejection fraction 44%.  There was no ischemia. The patient has not been having any chest pain.  She has a history of sleep apnea.  She has not yet [facility]. The patient has not been having any symptoms of gout.  She is not having any hypoglycemic episodes.  She has been experiencing nocturia 2-3 times a night. Past Medical History  Diagnosis Date  . Hypertension   . High cholesterol   . Kidney stones   . Type II diabetes mellitus   . GERD (gastroesophageal reflux disease)    . History of gout   . CAP (community acquired pneumonia) 01/09/2014    "first time I've ever had it"  . Diastolic congestive heart failure     Past Surgical History  Procedure Laterality Date  . Kidney stone surgery  10-05-12    "cut me on the side"  . Colonoscopy with propofol N/A 10/23/2012    Procedure: COLONOSCOPY WITH PROPOFOL;  Surgeon: [name], MD;  Location: [facility]OS[facility];  Service: Endoscopy;  Laterality: N/A;  . Abdominal hysterectomy  ~ 1974  . Lithotripsy  1980's?  . Cataract extraction w/ intraocular lens  implant, bilateral Bilateral 2014  . Right heart catheterization N/A 04/15/2014    Procedure: RIGHT HEART CATH;  Surgeon: Sanda Klein, MD;  Location: [facility] CATH LAB;  Service: Cardiovascular;  Laterality: N/A;     Current Outpatient Prescriptions  Medication Sig Dispense Refill  . albuterol (PROVENTIL HFA;VENTOLIN HFA) 108 (90 BASE) MCG/ACT inhaler Inhale 2 puffs into the lungs every 6 (six) hours as needed for wheezing or shortness of breath. 1 Inhaler 0  . allopurinol (ZYLOPRIM) 100 MG tablet Take 100 mg by mouth 2 (two) times daily.    [name] amLODipine (NORVASC) 10 MG tablet Take 10 mg by mouth every morning.     [name] aspirin EC 81 MG tablet Take 81 mg by mouth daily.    . bimatoprost (LUMIGAN) 0.01 % SOLN Place 1 drop into both eyes at bedtime.     . carvedilol (COREG) 12.5 MG tablet Take 1 tablet (12.5 mg  total) by mouth 2 (two) times daily with a meal. 60 tablet 5  . cholecalciferol (VITAMIN D) 1000 UNITS tablet Take 1,000 Units by mouth daily.    . furosemide (LASIX) 40 MG tablet Patient taking one (1) tablet in am and half (1/2) tablet in pm 60 tablet 1  . glimepiride (AMARYL) 1 MG tablet Take 1 mg by mouth daily.    . ondansetron (ZOFRAN ODT) 4 MG disintegrating tablet Take 1 tablet (4 mg total) by mouth every 8 (eight) hours as needed for nausea or vomiting. 12 tablet 0   No current facility-administered medications for this visit.    Allergies:   Codeine     Social History:  The patient  reports that she has never smoked. She has never used smokeless tobacco. She reports that she does not drink alcohol or use illicit drugs.   Family History:  The patient's family history includes Hypertension in her mother. There is no history of CAD.    ROS:  Please see the history of present illness.   Otherwise, review of systems are positive for none.   All other systems are reviewed and negative.    PHYSICAL EXAM: VS:  BP 140/72 mmHg  Pulse 68  Ht 5\' 1"  (1.549 m)  Wt 194 lb (87.998 kg)  BMI 36.67 kg/m2 , BMI Body mass index is 36.67 kg/(m^2). GEN: Well nourished, well developed, in no acute distress HEENT: normal Neck: no JVD, carotid bruits, or masses Cardiac: RRR; no murmurs, rubs, or gallops,no edema  Respiratory:  clear to auscultation bilaterally, normal work of breathing GI: soft, nontender, nondistended, + BS MS: no deformity or atrophy Skin: warm and dry, no rash Neuro:  Strength and sensation are intact Psych: euthymic mood, full affect   EKG:  EKG is not ordered today.    Recent Labs: 01/09/2014: TSH 2.440 02/05/2014: Pro B Natriuretic peptide (BNP) 2767.0* 04/11/2014: ALT 18 04/15/2014: B Natriuretic Peptide 591.0* 04/17/2014: Hemoglobin 8.3*; Platelets 260 04/19/2014: BUN 47*; Creatinine 1.88*; Potassium 4.4; Sodium 139    Lipid Panel No results found for: CHOL, TRIG, HDL, CHOLHDL, VLDL, LDLCALC, LDLDIRECT    Wt Readings from Last 3 Encounters:  07/17/14 194 lb (87.998 kg)  05/05/14 189 lb (85.73 kg)  04/29/14 181 lb (82.101 kg)         Two-dimensional echo: - Left ventricle: The cavity size was normal. Wall thickness was increased in a pattern of mild LVH. Systolic function was normal. The estimated ejection fraction was in the range of 50% to 55%. - Left atrium: The atrium was mildly dilated. - Pulmonary arteries: PA peak pressure: 32 mm Hg (S).   ASSESSMENT AND PLAN:  1. Biventricular congestive heart  failure, improved. Ejection fraction 50% by echo.  Ejection fraction 44% by Lexiscan Myoview on 05/05/14.  No reversible ischemia 2. Renal insufficiency stage 3 3. Anemia of chronic disease  Current medicines are reviewed at length with the patient today. The patient does not have concerns regarding medicines.  The following changes have been made: Increase carvedilol to 12.5 mg twice a day      Disposition: FU with Dr. Mare Ferrari in 4 months for office visit and EKG   Current medicines are reviewed at length with the patient today.  The patient does not have concerns regarding medicines.  The following changes have been made:  Increase carvedilol to 12.5 mg twice a day  Labs/ tests ordered today include:  No orders of the defined types were placed in this encounter.  Signed, Darlin Coco, MD  07/17/2014 1:15 PM    [facility] [place], [place], [place]  [address] Phone: [phone]; Fax: [phone]

## 2014-07-28 ENCOUNTER — Encounter (HOSPITAL_COMMUNITY): Payer: Self-pay | Admitting: Emergency Medicine

## 2014-07-28 ENCOUNTER — Inpatient Hospital Stay (HOSPITAL_COMMUNITY)
Admission: EM | Admit: 2014-07-28 | Discharge: 2014-08-02 | DRG: 291 | Disposition: A | Discharge status: Home / Self Care | Payer: Medicare Other | Attending: Internal Medicine | Admitting: Internal Medicine

## 2014-07-28 DIAGNOSIS — Z961 Presence of intraocular lens: Secondary | ICD-10-CM | POA: Diagnosis present

## 2014-07-28 DIAGNOSIS — E785 Hyperlipidemia, unspecified: Secondary | ICD-10-CM | POA: Diagnosis present

## 2014-07-28 DIAGNOSIS — Z6837 Body mass index (BMI) 37.0-37.9, adult: Secondary | ICD-10-CM

## 2014-07-28 DIAGNOSIS — K219 Gastro-esophageal reflux disease without esophagitis: Secondary | ICD-10-CM | POA: Diagnosis present

## 2014-07-28 DIAGNOSIS — E669 Obesity, unspecified: Secondary | ICD-10-CM | POA: Diagnosis present

## 2014-07-28 DIAGNOSIS — E118 Type 2 diabetes mellitus with unspecified complications: Secondary | ICD-10-CM

## 2014-07-28 DIAGNOSIS — I509 Heart failure, unspecified: Secondary | ICD-10-CM

## 2014-07-28 DIAGNOSIS — I5033 Acute on chronic diastolic (congestive) heart failure: Secondary | ICD-10-CM | POA: Diagnosis not present

## 2014-07-28 DIAGNOSIS — I129 Hypertensive chronic kidney disease with stage 1 through stage 4 chronic kidney disease, or unspecified chronic kidney disease: Secondary | ICD-10-CM | POA: Diagnosis present

## 2014-07-28 DIAGNOSIS — R0602 Shortness of breath: Secondary | ICD-10-CM | POA: Diagnosis present

## 2014-07-28 DIAGNOSIS — Z87442 Personal history of urinary calculi: Secondary | ICD-10-CM | POA: Diagnosis not present

## 2014-07-28 DIAGNOSIS — Z7982 Long term (current) use of aspirin: Secondary | ICD-10-CM | POA: Diagnosis not present

## 2014-07-28 DIAGNOSIS — Z9842 Cataract extraction status, left eye: Secondary | ICD-10-CM

## 2014-07-28 DIAGNOSIS — G4733 Obstructive sleep apnea (adult) (pediatric): Secondary | ICD-10-CM | POA: Diagnosis present

## 2014-07-28 DIAGNOSIS — I10 Essential (primary) hypertension: Secondary | ICD-10-CM | POA: Diagnosis not present

## 2014-07-28 DIAGNOSIS — E119 Type 2 diabetes mellitus without complications: Secondary | ICD-10-CM | POA: Diagnosis present

## 2014-07-28 DIAGNOSIS — Z888 Allergy status to other drugs, medicaments and biological substances status: Secondary | ICD-10-CM | POA: Diagnosis not present

## 2014-07-28 DIAGNOSIS — G4730 Sleep apnea, unspecified: Secondary | ICD-10-CM | POA: Diagnosis not present

## 2014-07-28 DIAGNOSIS — E1122 Type 2 diabetes mellitus with diabetic chronic kidney disease: Secondary | ICD-10-CM | POA: Diagnosis present

## 2014-07-28 DIAGNOSIS — E876 Hypokalemia: Secondary | ICD-10-CM | POA: Diagnosis present

## 2014-07-28 DIAGNOSIS — N183 Chronic kidney disease, stage 3 (moderate): Secondary | ICD-10-CM | POA: Diagnosis not present

## 2014-07-28 DIAGNOSIS — R001 Bradycardia, unspecified: Secondary | ICD-10-CM | POA: Diagnosis present

## 2014-07-28 DIAGNOSIS — Z885 Allergy status to narcotic agent status: Secondary | ICD-10-CM

## 2014-07-28 DIAGNOSIS — Z90710 Acquired absence of both cervix and uterus: Secondary | ICD-10-CM | POA: Diagnosis not present

## 2014-07-28 DIAGNOSIS — J9601 Acute respiratory failure with hypoxia: Secondary | ICD-10-CM | POA: Diagnosis present

## 2014-07-28 DIAGNOSIS — M109 Gout, unspecified: Secondary | ICD-10-CM | POA: Diagnosis present

## 2014-07-28 DIAGNOSIS — R0600 Dyspnea, unspecified: Secondary | ICD-10-CM | POA: Diagnosis not present

## 2014-07-28 DIAGNOSIS — N184 Chronic kidney disease, stage 4 (severe): Secondary | ICD-10-CM | POA: Diagnosis present

## 2014-07-28 DIAGNOSIS — T501X5A Adverse effect of loop [high-ceiling] diuretics, initial encounter: Secondary | ICD-10-CM | POA: Diagnosis present

## 2014-07-28 DIAGNOSIS — Z8701 Personal history of pneumonia (recurrent): Secondary | ICD-10-CM

## 2014-07-28 DIAGNOSIS — N189 Chronic kidney disease, unspecified: Secondary | ICD-10-CM

## 2014-07-28 DIAGNOSIS — I5043 Acute on chronic combined systolic (congestive) and diastolic (congestive) heart failure: Principal | ICD-10-CM | POA: Diagnosis present

## 2014-07-28 DIAGNOSIS — Z9841 Cataract extraction status, right eye: Secondary | ICD-10-CM | POA: Diagnosis not present

## 2014-07-28 DIAGNOSIS — I5031 Acute diastolic (congestive) heart failure: Secondary | ICD-10-CM | POA: Diagnosis not present

## 2014-07-28 DIAGNOSIS — N289 Disorder of kidney and ureter, unspecified: Secondary | ICD-10-CM

## 2014-07-28 HISTORY — DX: Chronic kidney disease, stage 4 (severe): N18.4

## 2014-07-28 HISTORY — DX: Type 2 diabetes mellitus with diabetic chronic kidney disease: E11.22

## 2014-07-28 LAB — CBC WITH DIFFERENTIAL/PLATELET
Basophils Absolute: 0 10*3/uL (ref 0.0–0.1)
Basophils Relative: 1 % (ref 0–1)
Eosinophils Absolute: 0.2 10*3/uL (ref 0.0–0.7)
Eosinophils Relative: 4 % (ref 0–5)
HCT: 31.3 % — ABNORMAL LOW (ref 36.0–46.0)
Hemoglobin: 10 g/dL — ABNORMAL LOW (ref 12.0–15.0)
Lymphocytes Relative: 18 % (ref 12–46)
Lymphs Abs: 1 10*3/uL (ref 0.7–4.0)
MCH: 28.2 pg (ref 26.0–34.0)
MCHC: 31.9 g/dL (ref 30.0–36.0)
MCV: 88.2 fL (ref 78.0–100.0)
Monocytes Absolute: 0.4 10*3/uL (ref 0.1–1.0)
Monocytes Relative: 7 % (ref 3–12)
Neutro Abs: 3.9 10*3/uL (ref 1.7–7.7)
Neutrophils Relative %: 70 % (ref 43–77)
Platelets: 244 10*3/uL (ref 150–400)
RBC: 3.55 MIL/uL — ABNORMAL LOW (ref 3.87–5.11)
RDW: 14.7 % (ref 11.5–15.5)
WBC: 5.5 10*3/uL (ref 4.0–10.5)

## 2014-07-28 LAB — BASIC METABOLIC PANEL
Anion gap: 13 (ref 5–15)
BUN: 48 mg/dL — ABNORMAL HIGH (ref 6–23)
CO2: 22 mmol/L (ref 19–32)
Calcium: 9.8 mg/dL (ref 8.4–10.5)
Chloride: 106 mmol/L (ref 96–112)
Creatinine, Ser: 2.27 mg/dL — ABNORMAL HIGH (ref 0.50–1.10)
GFR calc Af Amer: 23 mL/min — ABNORMAL LOW (ref >90)
GFR calc non Af Amer: 20 mL/min — ABNORMAL LOW (ref >90)
Glucose, Bld: 112 mg/dL — ABNORMAL HIGH (ref 70–99)
Potassium: 3.4 mmol/L — ABNORMAL LOW (ref 3.5–5.1)
Sodium: 141 mmol/L (ref 135–145)

## 2014-07-28 LAB — GLUCOSE, CAPILLARY: Glucose-Capillary: 74 mg/dL (ref 70–99)

## 2014-07-28 LAB — I-STAT TROPONIN, ED: Troponin i, poc: 0.05 ng/mL (ref 0.00–0.08)

## 2014-07-28 LAB — BRAIN NATRIURETIC PEPTIDE: B Natriuretic Peptide: 801.5 pg/mL — ABNORMAL HIGH (ref 0.0–100.0)

## 2014-07-28 MED ORDER — SODIUM CHLORIDE 0.9 % IV SOLN: 250.0000 mL | INTRAVENOUS | Status: DC | PRN

## 2014-07-28 MED ORDER — ALBUTEROL SULFATE (2.5 MG/3ML) 0.083% IN NEBU: 5.0000 mg | INHALATION_SOLUTION | Freq: Once | RESPIRATORY_TRACT | Status: DC

## 2014-07-28 MED ORDER — FUROSEMIDE 10 MG/ML IJ SOLN
40.0000 mg | Freq: Two times a day (BID) | INTRAMUSCULAR | Status: DC
  Filled 2014-07-28 (×2): qty 4

## 2014-07-28 MED ORDER — ALLOPURINOL 100 MG PO TABS
100.0000 mg | ORAL_TABLET | Freq: Two times a day (BID) | ORAL | Status: DC
  Administered 2014-07-28 – 2014-08-02 (×10): 100 mg via ORAL
  Filled 2014-07-28 (×11): qty 1

## 2014-07-28 MED ORDER — INSULIN ASPART 100 UNIT/ML ~~LOC~~ SOLN: 0.0000 [IU] | Freq: Every day | SUBCUTANEOUS | Status: DC

## 2014-07-28 MED ORDER — ALBUTEROL SULFATE (2.5 MG/3ML) 0.083% IN NEBU
2.5000 mg | INHALATION_SOLUTION | Freq: Four times a day (QID) | RESPIRATORY_TRACT | Status: DC | PRN
  Administered 2014-07-29 – 2014-07-31 (×5): 2.5 mg via RESPIRATORY_TRACT
  Filled 2014-07-28 (×6): qty 3

## 2014-07-28 MED ORDER — ASPIRIN EC 81 MG PO TBEC
81.0000 mg | DELAYED_RELEASE_TABLET | Freq: Every day | ORAL | Status: DC
  Administered 2014-07-29 – 2014-08-02 (×5): 81 mg via ORAL
  Filled 2014-07-28 (×5): qty 1

## 2014-07-28 MED ORDER — SODIUM CHLORIDE 0.9 % IJ SOLN
3.0000 mL | Freq: Two times a day (BID) | INTRAMUSCULAR | Status: DC
  Administered 2014-07-28 – 2014-08-02 (×10): 3 mL via INTRAVENOUS

## 2014-07-28 MED ORDER — CARVEDILOL 12.5 MG PO TABS
12.5000 mg | ORAL_TABLET | Freq: Two times a day (BID) | ORAL | Status: DC
  Administered 2014-07-28 – 2014-07-30 (×5): 12.5 mg via ORAL
  Filled 2014-07-28 (×6): qty 1

## 2014-07-28 MED ORDER — ONDANSETRON 4 MG PO TBDP
4.0000 mg | ORAL_TABLET | Freq: Three times a day (TID) | ORAL | Status: DC | PRN
  Filled 2014-07-28: qty 1

## 2014-07-28 MED ORDER — SODIUM CHLORIDE 0.9 % IJ SOLN: 3.0000 mL | INTRAMUSCULAR | Status: DC | PRN

## 2014-07-28 MED ORDER — PREDNISONE 20 MG PO TABS: 60.0000 mg | ORAL_TABLET | Freq: Once | ORAL | Status: DC

## 2014-07-28 MED ORDER — AMLODIPINE BESYLATE 10 MG PO TABS
10.0000 mg | ORAL_TABLET | Freq: Every morning | ORAL | Status: DC
  Administered 2014-07-29 – 2014-08-02 (×5): 10 mg via ORAL
  Filled 2014-07-28 (×5): qty 1

## 2014-07-28 MED ORDER — ALBUTEROL SULFATE HFA 108 (90 BASE) MCG/ACT IN AERS: 2.0000 | INHALATION_SPRAY | Freq: Four times a day (QID) | RESPIRATORY_TRACT | Status: DC | PRN

## 2014-07-28 MED ORDER — VITAMIN D3 25 MCG (1000 UNIT) PO TABS
2000.0000 [IU] | ORAL_TABLET | Freq: Every day | ORAL | Status: DC
  Administered 2014-07-29 – 2014-08-02 (×5): 2000 [IU] via ORAL
  Filled 2014-07-28 (×5): qty 2

## 2014-07-28 MED ORDER — INSULIN ASPART 100 UNIT/ML ~~LOC~~ SOLN
0.0000 [IU] | Freq: Three times a day (TID) | SUBCUTANEOUS | Status: DC
  Administered 2014-07-29 (×2): 1 [IU] via SUBCUTANEOUS
  Administered 2014-07-30: 2 [IU] via SUBCUTANEOUS
  Administered 2014-07-30: 1 [IU] via SUBCUTANEOUS
  Administered 2014-07-31: 3 [IU] via SUBCUTANEOUS
  Administered 2014-08-01: 1 [IU] via SUBCUTANEOUS
  Administered 2014-08-01: 2 [IU] via SUBCUTANEOUS

## 2014-07-28 MED ORDER — POTASSIUM CHLORIDE CRYS ER 20 MEQ PO TBCR
40.0000 meq | EXTENDED_RELEASE_TABLET | Freq: Once | ORAL | Status: AC
  Administered 2014-07-28: 40 meq via ORAL
  Filled 2014-07-28: qty 2

## 2014-07-28 MED ORDER — ACETAMINOPHEN 325 MG PO TABS
650.0000 mg | ORAL_TABLET | ORAL | Status: DC | PRN
  Administered 2014-08-01: 650 mg via ORAL
  Filled 2014-07-28: qty 2

## 2014-07-28 MED ORDER — POTASSIUM CHLORIDE CRYS ER 20 MEQ PO TBCR
40.0000 meq | EXTENDED_RELEASE_TABLET | Freq: Every day | ORAL | Status: DC
  Filled 2014-07-28: qty 2

## 2014-07-28 MED ORDER — FUROSEMIDE 10 MG/ML IJ SOLN
40.0000 mg | Freq: Once | INTRAMUSCULAR | Status: AC
  Administered 2014-07-28: 40 mg via INTRAVENOUS
  Filled 2014-07-28: qty 4

## 2014-07-28 MED ORDER — IPRATROPIUM BROMIDE 0.02 % IN SOLN: 0.5000 mg | Freq: Once | RESPIRATORY_TRACT | Status: DC

## 2014-07-28 MED ORDER — ONDANSETRON HCL 4 MG/2ML IJ SOLN: 4.0000 mg | Freq: Four times a day (QID) | INTRAMUSCULAR | Status: DC | PRN

## 2014-07-28 MED ORDER — HEPARIN SODIUM (PORCINE) 5000 UNIT/ML IJ SOLN
5000.0000 [IU] | Freq: Three times a day (TID) | INTRAMUSCULAR | Status: DC
  Administered 2014-07-28 – 2014-08-02 (×14): 5000 [IU] via SUBCUTANEOUS
  Filled 2014-07-28 (×17): qty 1

## 2014-07-28 NOTE — ED Notes (Signed)
Pt weighed, placed on 5-lead monitor, continuous BP and pulse ox. EKG completed at Triage

## 2014-07-28 NOTE — ED Provider Notes (Signed)
CSN: [id number]     Arrival date & time 07/28/14  1500 History   First MD Initiated Contact with Patient 07/28/14 1715     Chief Complaint  Patient presents with  . Shortness of Breath     (Consider location/radiation/quality/duration/timing/severity/associated sxs/prior Treatment) Patient is a 79 y.o. female presenting with shortness of breath. The history is provided by the patient.  Shortness of Breath She comes in with a five-day history of progressive dyspnea. Dyspnea is primarily on exertion, but she is also noted orthopnea and paroxysmal nocturnal dyspnea. There is been no cough. She denies fever, chills, sweats but denies chest pain, heaviness, tightness, pressure. There is been no nausea or vomiting. She denies any dietary indiscretions regarding salt intake. She does have history of congestive heart failure and saw her cardiologist about one and a half weeks ago and was told everything was fine at that point. She went to her PCP today because of dyspnea and was told that her oxygen levels were low, renal function was worse, and she was noted to have fluid on her legs. She has noted significant increase in weight (11 pounds).  Past Medical History  Diagnosis Date  . Hypertension   . High cholesterol   . Kidney stones   . Type II diabetes mellitus   . GERD (gastroesophageal reflux disease)   . History of gout   . CAP (community acquired pneumonia) 01/09/2014    "first time I've ever had it"  . Diastolic congestive heart failure    Past Surgical History  Procedure Laterality Date  . Kidney stone surgery  10-05-12    "cut me on the side"  . Colonoscopy with propofol N/A 10/23/2012    Procedure: COLONOSCOPY WITH PROPOFOL;  Surgeon: [name], MD;  Location: [facility];  Service: Endoscopy;  Laterality: N/A;  . Abdominal hysterectomy  ~ 1974  . Lithotripsy  1980's?  . Cataract extraction w/ intraocular lens  implant, bilateral Bilateral 2014  . Right heart catheterization  N/A 04/15/2014    Procedure: RIGHT HEART CATH;  Surgeon: Sanda Klein, MD;  Location: [facility] CATH LAB;  Service: Cardiovascular;  Laterality: N/A;   Family History  Problem Relation Age of Onset  . Hypertension Mother   . CAD Neg Hx    History  Substance Use Topics  . Smoking status: Never Smoker   . Smokeless tobacco: Never Used  . Alcohol Use: No   OB History    No data available     Review of Systems  Respiratory: Positive for shortness of breath.   All other systems reviewed and are negative.     Allergies  Simvastatin and Codeine  Home Medications   Prior to Admission medications   Medication Sig Start Date End Date Taking? Authorizing Provider  albuterol (PROVENTIL HFA;VENTOLIN HFA) 108 (90 BASE) MCG/ACT inhaler Inhale 2 puffs into the lungs every 6 (six) hours as needed for wheezing or shortness of breath. 01/13/14  Yes [name], MD  allopurinol (ZYLOPRIM) 100 MG tablet Take 100 mg by mouth 2 (two) times daily.   Yes Historical Provider, MD  amLODipine (NORVASC) 10 MG tablet Take 10 mg by mouth every morning.    Yes Historical Provider, MD  aspirin EC 81 MG tablet Take 81 mg by mouth daily.   Yes Historical Provider, MD  bimatoprost (LUMIGAN) 0.01 % SOLN Place 1 drop into both eyes at bedtime.    Yes Historical Provider, MD  carvedilol (COREG) 12.5 MG tablet Take 1 tablet (12.5  mg total) by mouth 2 (two) times daily with a meal. 07/17/14  Yes Darlin Coco, MD  cholecalciferol (VITAMIN D) 1000 UNITS tablet Take 2,000 Units by mouth daily.    Yes Historical Provider, MD  furosemide (LASIX) 40 MG tablet Take 20-40 mg by mouth 2 (two) times daily. Patient taking one (1) tablet in am and half (1/2) tablet in pm 07/17/14  Yes Darlin Coco, MD  glimepiride (AMARYL) 1 MG tablet Take 1 mg by mouth daily. 06/09/14  Yes Historical Provider, MD  ondansetron (ZOFRAN ODT) 4 MG disintegrating tablet Take 1 tablet (4 mg total) by mouth every 8 (eight) hours as needed for nausea or  vomiting. 04/06/14   [name], MD   BP 150/83 mmHg  Pulse 75  Temp(Src) 97.2 F (36.2 C) (Oral)  Resp 19  Wt 196 lb 14.4 oz (89.313 kg)  SpO2 98% Physical Exam  Nursing note and vitals reviewed.  79 year old female, resting comfortably and in no acute distress. Vital signs are significant for hypertension. Oxygen saturation is 98%, which is normal. Head is normocephalic and atraumatic. PERRLA, EOMI. Oropharynx is clear. Neck is nontender and supple without adenopathy or JVD. Back is nontender and there is no CVA tenderness. Lungs have bibasilar rales without wheezes or rhonchi. Chest is nontender. Heart has regular rate and rhythm without murmur. Abdomen is soft, flat, nontender without masses or hepatosplenomegaly and peristalsis is normoactive. Extremities have 2+ edema, full range of motion is present. Skin is warm and dry without rash. Neurologic: Mental status is normal, cranial nerves are intact, there are no motor or sensory deficits.  ED Course  Procedures (including critical care time) Labs Review Results for orders placed or performed during the hospital encounter of [id number]  Basic metabolic panel  Result Value Ref Range   Sodium 141 135 - 145 mmol/L   Potassium 3.4 (L) 3.5 - 5.1 mmol/L   Chloride 106 96 - 112 mmol/L   CO2 22 19 - 32 mmol/L   Glucose, Bld 112 (H) 70 - 99 mg/dL   BUN 48 (H) 6 - 23 mg/dL   Creatinine, Ser 2.27 (H) 0.50 - 1.10 mg/dL   Calcium 9.8 8.4 - 10.5 mg/dL   GFR calc non Af Amer 20 (L) >90 mL/min   GFR calc Af Amer 23 (L) >90 mL/min   Anion gap 13 5 - 15  BNP (order ONLY if patient complains of dyspnea/SOB AND you have documented it for THIS visit)  Result Value Ref Range   B Natriuretic Peptide 801.5 (H) 0.0 - 100.0 pg/mL  CBC with Differential  Result Value Ref Range   WBC 5.5 4.0 - 10.5 K/uL   RBC 3.55 (L) 3.87 - 5.11 MIL/uL   Hemoglobin 10.0 (L) 12.0 - 15.0 g/dL   HCT 31.3 (L) 36.0 - 46.0 %   MCV 88.2 78.0 - 100.0 fL   MCH  28.2 26.0 - 34.0 pg   MCHC 31.9 30.0 - 36.0 g/dL   RDW 14.7 11.5 - 15.5 %   Platelets 244 150 - 400 K/uL   Neutrophils Relative % 70 43 - 77 %   Neutro Abs 3.9 1.7 - 7.7 K/uL   Lymphocytes Relative 18 12 - 46 %   Lymphs Abs 1.0 0.7 - 4.0 K/uL   Monocytes Relative 7 3 - 12 %   Monocytes Absolute 0.4 0.1 - 1.0 K/uL   Eosinophils Relative 4 0 - 5 %   Eosinophils Absolute 0.2 0.0 - 0.7 K/uL  Basophils Relative 1 0 - 1 %   Basophils Absolute 0.0 0.0 - 0.1 K/uL  I-stat troponin, ED (not at [facility])  Result Value Ref Range   Troponin i, poc 0.05 0.00 - 0.08 ng/mL   Comment 3           Imaging Review No results found.   EKG Interpretation   Date/Time:  Monday July 28 2014 15:08:21 EDT Ventricular Rate:  74 PR Interval:  130 QRS Duration: 148 QT Interval:  444 QTC Calculation: 492 R Axis:   97 Text Interpretation:  Sinus rhythm with Premature atrial complexes with  Abberant conduction Rightward axis Non-specific intra-ventricular  conduction block  - probable  Left bundle branch block Abnormal ECG When  compared with ECG of 04/10/2014, Left bundle branch block is now Present  Confirmed by [facility]  MD, Gaylord Seydel ([id number]) on 07/28/2014 5:17:22 PM      MDM   Final diagnoses:  Acute on chronic combined systolic and diastolic CHF (congestive heart failure)  Acute on chronic renal insufficiency  Hypokalemia    CHF exacerbation. BNP is modestly elevated over her most recent value, but there has been a significant bump in her creatinine. Mild hypokalemia is also noted. She will need very careful monitoring during diuresis to make sure renal function is not worsening and make sure her potassium stays in a reasonable range. She is given a dose of furosemide in the ED, and a dose of oral potassium. Case is discussed with Dr. Eliseo Squires of [facility] who agrees to admit the patient.    Delora Fuel, MD [id number]

## 2014-07-28 NOTE — ED Provider Notes (Signed)
Assigned to this patient but she was not bedded and never seen by a provider.  Orders were inappropriately placed.    Tori Milks, MD   [place] Milks, MD [id number]  Delora Fuel, MD [id number]

## 2014-07-28 NOTE — H&P (Signed)
[facility] History and Physical  KAJAL BONICA [id number] DOB: March 12, 1936 DOA: 07/28/2014  Referring physician: er PCP: Irven Shelling, MD   Chief Complaint: sob  HPI: Doris Howell is a 79 y.o. female  Who has h/o diastolic HF.  Wednesday, she developed SOB, orthopnea, and leg swelling.  She did not call her PCP or cardiology.  She does not weight herself daily.  She is SOB at rest and worse during exertion,  No CP, no fever, no chills.   She was seen by her PCP today due to the SOB and was found to have low O2 levels, worsening renal function and fluid on her legs.  Her weight per their records was increase 11 pounds. Weight at last d/c on 1/19 was around 182 lbs- current weight is 197.  In the ER, she was ordered bu tnot given yet, lasix and Kdur.      Review of Systems:  All systems reviewed, negative unless stated above   Past Medical History  Diagnosis Date  . Hypertension   . High cholesterol   . Kidney stones   . Type II diabetes mellitus   . GERD (gastroesophageal reflux disease)   . History of gout   . CAP (community acquired pneumonia) 01/09/2014    "first time I've ever had it"  . Diastolic congestive heart failure    Past Surgical History  Procedure Laterality Date  . Kidney stone surgery  10-05-12    "cut me on the side"  . Colonoscopy with propofol N/A 10/23/2012    Procedure: COLONOSCOPY WITH PROPOFOL;  Surgeon: [name], MD;  Location: [facility];  Service: Endoscopy;  Laterality: N/A;  . Abdominal hysterectomy  ~ 1974  . Lithotripsy  1980's?  . Cataract extraction w/ intraocular lens  implant, bilateral Bilateral 2014  . Right heart catheterization N/A 04/15/2014    Procedure: RIGHT HEART CATH;  Surgeon: Sanda Klein, MD;  Location: [facility] CATH LAB;  Service: Cardiovascular;  Laterality: N/A;   Social History:  reports that she has never smoked. She has never used smokeless tobacco. She reports that she does not drink  alcohol or use illicit drugs.  Allergies  Allergen Reactions  . Simvastatin Other (See Comments)    Myalgias- MD took patient off med  . Codeine Other (See Comments)    Unknown; pt can't remember. It was in the '70s    Family History  Problem Relation Age of Onset  . Hypertension Mother   . CAD Neg Hx     Prior to Admission medications   Medication Sig Start Date End Date Taking? Authorizing Provider  albuterol (PROVENTIL HFA;VENTOLIN HFA) 108 (90 BASE) MCG/ACT inhaler Inhale 2 puffs into the lungs every 6 (six) hours as needed for wheezing or shortness of breath. 01/13/14  Yes [name], MD  allopurinol (ZYLOPRIM) 100 MG tablet Take 100 mg by mouth 2 (two) times daily.   Yes Historical Provider, MD  amLODipine (NORVASC) 10 MG tablet Take 10 mg by mouth every morning.    Yes Historical Provider, MD  aspirin EC 81 MG tablet Take 81 mg by mouth daily.   Yes Historical Provider, MD  bimatoprost (LUMIGAN) 0.01 % SOLN Place 1 drop into both eyes at bedtime.    Yes Historical Provider, MD  carvedilol (COREG) 12.5 MG tablet Take 1 tablet (12.5 mg total) by mouth 2 (two) times daily with a meal. 07/17/14  Yes Darlin Coco, MD  cholecalciferol (VITAMIN D) 1000 UNITS tablet Take 2,000 Units  by mouth daily.    Yes Historical Provider, MD  furosemide (LASIX) 40 MG tablet Take 20-40 mg by mouth 2 (two) times daily. Patient taking one (1) tablet in am and half (1/2) tablet in pm 07/17/14  Yes Darlin Coco, MD  glimepiride (AMARYL) 1 MG tablet Take 1 mg by mouth daily. 06/09/14  Yes Historical Provider, MD  ondansetron (ZOFRAN ODT) 4 MG disintegrating tablet Take 1 tablet (4 mg total) by mouth every 8 (eight) hours as needed for nausea or vomiting. 04/06/14   [name], MD   Physical Exam: Filed Vitals:   07/28/14 1508 07/28/14 1637  BP: 137/64 150/83  Pulse: 84 75  Temp: 97.2 F (36.2 C)   TempSrc: Oral   Resp: 24 19  Weight:  89.313 kg (196 lb 14.4 oz)  SpO2: 94% 98%    Wt  Readings from Last 3 Encounters:  07/28/14 89.313 kg (196 lb 14.4 oz)  07/17/14 87.998 kg (194 lb)  05/05/14 85.73 kg (189 lb)    General:  Appears calm and comfortable Eyes: PERRL, normal lids, irises & conjunctiva ENT: grossly normal hearing, lips & tongue Neck: no LAD, masses or thyromegaly Cardiovascular: RRR, no m/r/g. + edema- 2. Respiratory: crackles at bases Abdomen: soft, ntnd Musculoskeletal: grossly normal tone BUE/BLE Psychiatric: grossly normal mood and affect, speech fluent and appropriate Neurologic: grossly non-focal.          Labs on Admission:  Basic Metabolic Panel:  Recent Labs Lab 07/28/14 1536  NA 141  K 3.4*  CL 106  CO2 22  GLUCOSE 112*  BUN 48*  CREATININE 2.27*  CALCIUM 9.8   Liver Function Tests: No results for input(s): AST, ALT, ALKPHOS, BILITOT, PROT, ALBUMIN in the last 168 hours. No results for input(s): LIPASE, AMYLASE in the last 168 hours. No results for input(s): AMMONIA in the last 168 hours. CBC:  Recent Labs Lab 07/28/14 1536  WBC 5.5  NEUTROABS 3.9  HGB 10.0*  HCT 31.3*  MCV 88.2  PLT 244   Cardiac Enzymes: No results for input(s): CKTOTAL, CKMB, CKMBINDEX, TROPONINI in the last 168 hours.  BNP (last 3 results)  Recent Labs  04/10/14 2350 04/15/14 0408 07/28/14 1536  BNP 909.9* 591.0* 801.5*    ProBNP (last 3 results)  Recent Labs  01/09/14 1515 02/05/14 1216  PROBNP 2595.0* 2767.0*    CBG: No results for input(s): GLUCAP in the last 168 hours.  Radiological Exams on Admission: No results found.  EKG: Independently reviewed. Sinus with PVC  Assessment/Plan Active Problems:   CKD (chronic kidney disease), stage III   CHF exacerbation   Diabetes mellitus   SOB (shortness of breath)   Obesity (BMI 37) OSA   SOB due to acute exacerbation of diastolic dsfx- IV lasix, daily weights, i/os, place on heart failure floor  Hypokalemia -replace PO -recheck in AM  CKD- baseline about 2  DM-  SSI  OSA- has not picked up equipment from [facility]  HTN- resume home meds, IV hydralazine PRN- may need increase in home meds   Code Status: full DVT Prophylaxis: Family Communication: family at bedside Disposition Plan:   Time spent: 49 min  Eulogio Bear [facility] Pager [phone]

## 2014-07-28 NOTE — ED Notes (Signed)
Pt c/o SOB and leg swelling; pt sent here from PCP for further eval

## 2014-07-29 ENCOUNTER — Inpatient Hospital Stay (HOSPITAL_COMMUNITY): Payer: Medicare Other

## 2014-07-29 DIAGNOSIS — G4730 Sleep apnea, unspecified: Secondary | ICD-10-CM | POA: Diagnosis present

## 2014-07-29 DIAGNOSIS — R0602 Shortness of breath: Secondary | ICD-10-CM

## 2014-07-29 DIAGNOSIS — I5031 Acute diastolic (congestive) heart failure: Secondary | ICD-10-CM

## 2014-07-29 LAB — BASIC METABOLIC PANEL
Anion gap: 11 (ref 5–15)
BUN: 45 mg/dL — ABNORMAL HIGH (ref 6–23)
CO2: 26 mmol/L (ref 19–32)
Calcium: 9.2 mg/dL (ref 8.4–10.5)
Chloride: 108 mmol/L (ref 96–112)
Creatinine, Ser: 2.24 mg/dL — ABNORMAL HIGH (ref 0.50–1.10)
GFR calc Af Amer: 23 mL/min — ABNORMAL LOW (ref >90)
GFR calc non Af Amer: 20 mL/min — ABNORMAL LOW (ref >90)
Glucose, Bld: 124 mg/dL — ABNORMAL HIGH (ref 70–99)
Potassium: 3.4 mmol/L — ABNORMAL LOW (ref 3.5–5.1)
Sodium: 145 mmol/L (ref 135–145)

## 2014-07-29 LAB — MAGNESIUM
Magnesium: 2.1 mg/dL (ref 1.5–2.5)
Magnesium: 2.1 mg/dL (ref 1.5–2.5)

## 2014-07-29 LAB — GLUCOSE, CAPILLARY
Glucose-Capillary: 77 mg/dL (ref 70–99)
Glucose-Capillary: 131 mg/dL — ABNORMAL HIGH (ref 70–99)
Glucose-Capillary: 128 mg/dL — ABNORMAL HIGH (ref 70–99)
Glucose-Capillary: 146 mg/dL — ABNORMAL HIGH (ref 70–99)

## 2014-07-29 LAB — TROPONIN I
Troponin I: 0.07 ng/mL — ABNORMAL HIGH (ref <0.031)
Troponin I: 0.06 ng/mL — ABNORMAL HIGH (ref <0.031)
Troponin I: 0.07 ng/mL — ABNORMAL HIGH (ref <0.031)

## 2014-07-29 LAB — D-DIMER, QUANTITATIVE: D-Dimer, Quant: 1.27 ug/mL-FEU — ABNORMAL HIGH (ref 0.00–0.48)

## 2014-07-29 LAB — TSH: TSH: 1.984 u[IU]/mL (ref 0.350–4.500)

## 2014-07-29 MED ORDER — FUROSEMIDE 10 MG/ML IJ SOLN
INTRAMUSCULAR | Status: AC
Start: 2014-07-29 — End: 2014-07-29
  Administered 2014-07-29: 80 mg via INTRAVENOUS
  Filled 2014-07-29: qty 8

## 2014-07-29 MED ORDER — FUROSEMIDE 10 MG/ML IJ SOLN
60.0000 mg | Freq: Once | INTRAMUSCULAR | Status: AC
  Administered 2014-07-29: 60 mg via INTRAVENOUS

## 2014-07-29 MED ORDER — FUROSEMIDE 10 MG/ML IJ SOLN
80.0000 mg | Freq: Once | INTRAMUSCULAR | Status: AC
  Administered 2014-07-29: 80 mg via INTRAVENOUS

## 2014-07-29 MED ORDER — FUROSEMIDE 10 MG/ML IJ SOLN
60.0000 mg | Freq: Two times a day (BID) | INTRAMUSCULAR | Status: DC
  Administered 2014-07-29 – 2014-07-30 (×2): 60 mg via INTRAVENOUS
  Filled 2014-07-29 (×4): qty 6

## 2014-07-29 MED ORDER — POTASSIUM CHLORIDE CRYS ER 20 MEQ PO TBCR
40.0000 meq | EXTENDED_RELEASE_TABLET | Freq: Two times a day (BID) | ORAL | Status: DC
  Administered 2014-07-29 – 2014-08-02 (×9): 40 meq via ORAL
  Filled 2014-07-29 (×9): qty 2

## 2014-07-29 MED ORDER — FUROSEMIDE 10 MG/ML IJ SOLN
40.0000 mg | Freq: Once | INTRAMUSCULAR | Status: AC
  Administered 2014-07-29: 40 mg via INTRAVENOUS
  Filled 2014-07-29: qty 4

## 2014-07-29 MED ORDER — ISOSORBIDE MONONITRATE ER 30 MG PO TB24
30.0000 mg | ORAL_TABLET | Freq: Every day | ORAL | Status: DC
  Administered 2014-07-29 – 2014-08-02 (×5): 30 mg via ORAL
  Filled 2014-07-29 (×6): qty 1

## 2014-07-29 NOTE — Progress Notes (Signed)
CARE MANAGEMENT NOTE 07/29/2014  Patient:  Doris Howell, Doris Howell   Account Number:  [id number]  Date Initiated:  07/29/2014  Documentation initiated by:  Lorne Skeens  Subjective/Objective Assessment:   Patient was admitted with shortness of breath due to acute CHF exacerbation. Lives at home with spouse.     Action/Plan:   Will follow for discharge needs.   Anticipated DC Date:  08/01/2014   Anticipated DC Plan:  [place]  CM consult      Choice offered to / List presented to:             Status of service:  In process, will continue to follow Medicare Important Message given?   (If response is "NO", the following Medicare IM given date fields will be blank) Date Medicare IM given:   Medicare IM given by:   Date Additional Medicare IM given:   Additional Medicare IM given by:    Discharge Disposition:    Per UR Regulation:  Reviewed for med. necessity/level of care/duration of stay  If discussed at [place] of Stay Meetings, dates discussed:    Comments:

## 2014-07-29 NOTE — Progress Notes (Signed)
RT note: ABG results are with pt. on 3.5 lpm n/c, not 2.0 lpm, RT to monitor.

## 2014-07-29 NOTE — Care Management Note (Unsigned)
    Page 1 of 1   08/01/2014     5:34:48 PM CARE MANAGEMENT NOTE 08/01/2014  Patient:  Doris Howell, Doris Howell   Account Number:  [id number]  Date Initiated:  07/29/2014  Documentation initiated by:  Lorne Skeens  Subjective/Objective Assessment:   Patient was admitted with shortness of breath due to acute CHF exacerbation. Lives at home with spouse.     Action/Plan:   Will follow for discharge needs.   Anticipated DC Date:  08/01/2014   Anticipated DC Plan:  [place]  CM consult      Choice offered to / List presented to:             Status of service:  In process, will continue to follow Medicare Important Message given?  YES (If response is "NO", the following Medicare IM given date fields will be blank) Date Medicare IM given:  07/31/2014 Medicare IM given by:  Dagon Budai Date Additional Medicare IM given:   Additional Medicare IM given by:    Discharge Disposition:    Per UR Regulation:  Reviewed for med. necessity/level of care/duration of stay  If discussed at [place] of Stay Meetings, dates discussed:    Comments:  07/29/14 Ellan Lambert, RN, BSN [phone] Referral to CHF Navigator for CHF education, per MD request.  Pt may be appropriate for [facility] at dc for disease management.

## 2014-07-29 NOTE — Progress Notes (Signed)
Paged [name] at 2100 as patient has been struggling to breath all day.  Vital signs stable.  5LNC at 96%.  Ordered to give extra dose of lasix 40mg  IV.  D-Dimer done and came back 1.27.  Paged him to let him know this. Ordered for VQ scan in the AM.

## 2014-07-29 NOTE — Progress Notes (Signed)
UR complete.  Izaiha Lo RN, MSN 

## 2014-07-29 NOTE — Treatment Plan (Signed)
Earlier this afternoon, was called by RN that pt appears more dyspneic and sob. STAT cxr and ABG ordered. CXR with evidence of pulm edema per my read. ABG with pO2 of 66. Pt given 80mg  IV lasix and Cardiology consulted for further assistance. Will follow

## 2014-07-29 NOTE — Progress Notes (Signed)
Patient arrived to 3E28 from [facility] ED, alert and oriented, accompanied by family. Patient oriented to room and equipment. Cardiac monitoring initiated. Patient is tachypneic, SOB gets worse on exertion. O2Sat 97% at 2L/min per [place]. Will monitor closely overnight.

## 2014-07-29 NOTE — Progress Notes (Signed)
Pt c/o SOB, o2 sats 93% on 2L o2.  Pt given albuterol PRN.  Will continue to monitor.

## 2014-07-29 NOTE — Progress Notes (Signed)
RT Note: Rt called patients RN because patients Po2 is low on her ABG. She was on 3.5 lpm nasal cannula when the lab work was drawn. RN is going to page the patients MD to notify them of ABG results. Patients RN is questioning whether patient needs to be moved to step down unit. Patient did not appear to be in any acute respiratory distress at the time lab was drawn. She just appeared slightly anxious regarding being stuck. Rt awaiting further orders and will continue to monitor.

## 2014-07-29 NOTE — Progress Notes (Addendum)
[facility] PROGRESS NOTE  Doris Howell [id number] DOB: 1936-01-16 DOA: 07/28/2014 PCP: Irven Shelling, MD  Assessment/Plan: 1. Acutely decompensated diastolic CHF 1. Dry wt 87.2kg 2. Wt today: 87.5kg 3. Wt on admit: 89kg 4. I/O inaccurate since pt is mildly incontinent from diuresis 5. Will place foley cath for strict i/o 6. Cont diuresis. Follow lytes and correct as needed 2. CKD 1. Renal function stable 2. Cont to monitor renal panel 3. Hypokalemia 1. replaced 4. DM2 1. Blood glucose well controled 2. Cont SSI coverage 5. OSA 1. In the process of receiving CPAP as outpt 2. Will order CPAP while here 6. HTN 1. BP stable 2. Monitor 7. DVT prophylaxis 1. Heparin subQ  Code Status: Full Family Communication: Pt in room, family at bedside Disposition Plan: Pending   Consultants:    Procedures:    Antibiotics:  none (indicate start date, and stop date if known)  HPI/Subjective: Feels somewhat better today, but still SOB  Objective: Filed Vitals:   07/29/14 0218 07/29/14 0615 07/29/14 1135 07/29/14 1221  BP: 128/58 123/51 135/64 146/82  Pulse: 78 71 67 70  Temp: 98.2 F (36.8 C) 98.5 F (36.9 C)    TempSrc: Oral Oral    Resp: 22 21    Height:      Weight:  87.544 kg (193 lb)    SpO2: 94% 100%  93%    Intake/Output Summary (Last 24 hours) at 07/29/14 1224 Last data filed at 07/29/14 1002  Gross per 24 hour  Intake    700 ml  Output   1450 ml  Net   -750 ml   Filed Weights   07/28/14 1637 07/28/14 2002 07/29/14 0615  Weight: 89.313 kg (196 lb 14.4 oz) 88.633 kg (195 lb 6.4 oz) 87.544 kg (193 lb)    Exam:   General:  Awake, sitting in bed, in nad  Cardiovascular: regular, s1, s2  Respiratory: normal resp effort, no wheezing  Abdomen: soft,nondistended  Musculoskeletal: perfused, no clubbing   Data Reviewed: Basic Metabolic Panel:  Recent Labs Lab 07/28/14 1536 07/28/14 2055 07/29/14 0302  NA 141  --  145   K 3.4*  --  3.4*  CL 106  --  108  CO2 22  --  26  GLUCOSE 112*  --  124*  BUN 48*  --  45*  CREATININE 2.27*  --  2.24*  CALCIUM 9.8  --  9.2  MG  --  2.1  --    Liver Function Tests: No results for input(s): AST, ALT, ALKPHOS, BILITOT, PROT, ALBUMIN in the last 168 hours. No results for input(s): LIPASE, AMYLASE in the last 168 hours. No results for input(s): AMMONIA in the last 168 hours. CBC:  Recent Labs Lab 07/28/14 1536  WBC 5.5  NEUTROABS 3.9  HGB 10.0*  HCT 31.3*  MCV 88.2  PLT 244   Cardiac Enzymes:  Recent Labs Lab 07/28/14 2055 07/29/14 0302 07/29/14 0935  TROPONINI 0.07* 0.06* 0.07*   BNP (last 3 results)  Recent Labs  04/10/14 2350 04/15/14 0408 07/28/14 1536  BNP 909.9* 591.0* 801.5*    ProBNP (last 3 results)  Recent Labs  01/09/14 1515 02/05/14 1216  PROBNP 2595.0* 2767.0*    CBG:  Recent Labs Lab 07/28/14 2125 07/29/14 0749  GLUCAP 74 77    No results found for this or any previous visit (from the past 240 hour(s)).   Studies: No results found.  Scheduled Meds: . allopurinol  100 mg Oral BID  .  amLODipine  10 mg Oral q morning - 10a  . aspirin EC  81 mg Oral Daily  . carvedilol  12.5 mg Oral BID WC  . cholecalciferol  2,000 Units Oral Daily  . furosemide  60 mg Intravenous BID  . heparin  5,000 Units Subcutaneous 3 times per day  . insulin aspart  0-5 Units Subcutaneous QHS  . insulin aspart  0-9 Units Subcutaneous TID WC  . potassium chloride  40 mEq Oral BID  . sodium chloride  3 mL Intravenous Q12H   Continuous Infusions:   Principal Problem:   Acute diastolic heart failure Active Problems:   CKD (chronic kidney disease), stage III   CHF exacerbation   Diabetes mellitus   SOB (shortness of breath)   Obesity (BMI 37)    CHIU, STEPHEN K  [facility] Pager [phone]. If 7PM-7AM, please contact night-coverage at [email or web address], password [facility] 07/29/2014, 12:24 PM  LOS: 1 day

## 2014-07-29 NOTE — Consult Note (Signed)
Reason for Consult:   CHF  Requesting Physician: [facility] Primary Cardiologist Dr Mare Ferrari  HPI: This is a 79 y.o. female with a past medical history significant for chronic diastolic CHF. She has had several admissions, last admission Jan 2016. She had a Rt heart cath then and a Myoview (Low risk). Her EF by echo Oct 2015 was 50-55%. She is admitted now with several days of increasing dyspnea. Her wgt at discharge in in April at her OV with Dr Mare Ferrari was 194.  She admits she has been eating chicken noodle soup. She received 60 mg IV Lasix this am but then had increasing dyspnea and another dose of 80 mg was given this afternoon. She is now breathing better but still SOB. Her O2 by ABG was in the 60's. We are asked to see in consult.   PMHx:  Past Medical History  Diagnosis Date  . Hypertension   . High cholesterol   . Kidney stones   . Type II diabetes mellitus   . GERD (gastroesophageal reflux disease)   . History of gout   . CAP (community acquired pneumonia) 01/09/2014    "first time I've ever had it"  . Diastolic congestive heart failure     Past Surgical History  Procedure Laterality Date  . Kidney stone surgery  10-05-12    "cut me on the side"  . Colonoscopy with propofol N/A 10/23/2012    Procedure: COLONOSCOPY WITH PROPOFOL;  Surgeon: [name], MD;  Location: [facility]OS[facility];  Service: Endoscopy;  Laterality: N/A;  . Abdominal hysterectomy  ~ 1974  . Lithotripsy  1980's?  . Cataract extraction w/ intraocular lens  implant, bilateral Bilateral 2014  . Right heart catheterization N/A 04/15/2014    Procedure: RIGHT HEART CATH;  Surgeon: Sanda Klein, MD;  Location: [facility] CATH LAB;  Service: Cardiovascular;  Laterality: N/A;    SOCHx:  reports that she has never smoked. She has never used smokeless tobacco. She reports that she does not drink alcohol or use illicit drugs.  FAMHx: Family History  Problem Relation Age of Onset  . Hypertension  Mother   . CAD Neg Hx     ALLERGIES: Allergies  Allergen Reactions  . Simvastatin Other (See Comments)    Myalgias- MD took patient off med  . Codeine Other (See Comments)    Unknown; pt can't remember. It was in the '70s    ROS: Pertinent items are noted in HPI. See H&P for complete details.   HOME MEDICATIONS: Prior to Admission medications   Medication Sig Start Date End Date Taking? Authorizing Provider  albuterol (PROVENTIL HFA;VENTOLIN HFA) 108 (90 BASE) MCG/ACT inhaler Inhale 2 puffs into the lungs every 6 (six) hours as needed for wheezing or shortness of breath. 01/13/14  Yes [name], MD  allopurinol (ZYLOPRIM) 100 MG tablet Take 100 mg by mouth 2 (two) times daily.   Yes Historical Provider, MD  amLODipine (NORVASC) 10 MG tablet Take 10 mg by mouth every morning.    Yes Historical Provider, MD  aspirin EC 81 MG tablet Take 81 mg by mouth daily.   Yes Historical Provider, MD  bimatoprost (LUMIGAN) 0.01 % SOLN Place 1 drop into both eyes at bedtime.    Yes Historical Provider, MD  carvedilol (COREG) 12.5 MG tablet Take 1 tablet (12.5 mg total) by mouth 2 (two) times daily with a meal. 07/17/14  Yes Darlin Coco, MD  cholecalciferol (VITAMIN D) 1000 UNITS tablet  Take 2,000 Units by mouth daily.    Yes Historical Provider, MD  furosemide (LASIX) 40 MG tablet Take 20-40 mg by mouth 2 (two) times daily. Patient taking one (1) tablet in am and half (1/2) tablet in pm 07/17/14  Yes Darlin Coco, MD  glimepiride (AMARYL) 1 MG tablet Take 1 mg by mouth daily. 06/09/14  Yes Historical Provider, MD  ondansetron (ZOFRAN ODT) 4 MG disintegrating tablet Take 1 tablet (4 mg total) by mouth every 8 (eight) hours as needed for nausea or vomiting. 04/06/14   [name], MD    HOSPITAL MEDICATIONS: I have reviewed the patient's current medications.  VITALS: Blood pressure 148/66, pulse 73, temperature 98.5 F (36.9 C), temperature source Oral, resp. rate 21, height 5\' 1"  (1.549  m), weight 193 lb (87.544 kg), SpO2 96 %.  PHYSICAL EXAM: General appearance: alert, cooperative, mild distress and morbidly obese Neck: no carotid bruit and no JVD Lungs: decreased breath sounds bilateraly with faint rales on Lt Heart: regular rate and rhythm Abdomen: obese Extremities: trace to 1+ edema Pulses: 2+ and symmetric Skin: Skin color, texture, turgor normal. No rashes or lesions Neurologic: Grossly normal  LABS: Results for orders placed or performed during the hospital encounter of 07/28/14 (from the past 24 hour(s))  Magnesium     Status: None   Collection Time: 07/28/14  8:55 PM  Result Value Ref Range   Magnesium 2.1 1.5 - 2.5 mg/dL  Troponin I     Status: Abnormal   Collection Time: 07/28/14  8:55 PM  Result Value Ref Range   Troponin I 0.07 (H) <0.031 ng/mL  Glucose, capillary     Status: None   Collection Time: 07/28/14  9:25 PM  Result Value Ref Range   Glucose-Capillary 74 70 - 99 mg/dL   Comment 1 Notify RN   Basic metabolic panel     Status: Abnormal   Collection Time: 07/29/14  3:02 AM  Result Value Ref Range   Sodium 145 135 - 145 mmol/L   Potassium 3.4 (L) 3.5 - 5.1 mmol/L   Chloride 108 96 - 112 mmol/L   CO2 26 19 - 32 mmol/L   Glucose, Bld 124 (H) 70 - 99 mg/dL   BUN 45 (H) 6 - 23 mg/dL   Creatinine, Ser 2.24 (H) 0.50 - 1.10 mg/dL   Calcium 9.2 8.4 - 10.5 mg/dL   GFR calc non Af Amer 20 (L) >90 mL/min   GFR calc Af Amer 23 (L) >90 mL/min   Anion gap 11 5 - 15  Troponin I     Status: Abnormal   Collection Time: 07/29/14  3:02 AM  Result Value Ref Range   Troponin I 0.06 (H) <0.031 ng/mL  Glucose, capillary     Status: None   Collection Time: 07/29/14  7:49 AM  Result Value Ref Range   Glucose-Capillary 77 70 - 99 mg/dL  Troponin I     Status: Abnormal   Collection Time: 07/29/14  9:35 AM  Result Value Ref Range   Troponin I 0.07 (H) <0.031 ng/mL  Magnesium     Status: None   Collection Time: 07/29/14  9:35 AM  Result Value Ref Range     Magnesium 2.1 1.5 - 2.5 mg/dL  Glucose, capillary     Status: Abnormal   Collection Time: 07/29/14 12:28 PM  Result Value Ref Range   Glucose-Capillary 131 (H) 70 - 99 mg/dL  Blood gas, arterial     Status: Abnormal   Collection  Time: 07/29/14  3:09 PM  Result Value Ref Range   O2 Content 2.0 L/min   Delivery systems NASAL CANNULA    pH, Arterial 7.384 7.350 - 7.450   pCO2 arterial 44.5 35.0 - 45.0 mmHg   pO2, Arterial 66.4 (L) 80.0 - 100.0 mmHg   Bicarbonate 25.9 (H) 20.0 - 24.0 mEq/L   TCO2 27.3 0 - 100 mmol/L   Acid-Base Excess 1.4 0.0 - 2.0 mmol/L   O2 Saturation 92.5 %   Patient temperature 98.6    Collection site LEFT RADIAL    Drawn by [id number]    Sample type ARTERIAL    Allens test (pass/fail) PASS PASS    EKG:  NSR, new LBBB type IVCD and PVCs-QTc 492  IMAGING: Dg Chest Port 1 View  07/29/2014   CLINICAL DATA:  Labored breathing with any activity  EXAM: PORTABLE CHEST - 1 VIEW  COMPARISON:  04/18/2014  FINDINGS: Bilateral small pleural effusions with bilateral interstitial and alveolar airspace opacities and central pulmonary vascular prominence. No pneumothorax. Stable cardiomegaly. No acute osseous abnormality.  IMPRESSION: Findings consistent with CHF.   Electronically Signed   By: Kathreen Devoid   On: 07/29/2014 15:59    IMPRESSION: Principal Problem:   Diastolic CHF, acute on chronic Active Problems:   Acute respiratory failure with hypoxia   CKD (chronic kidney disease), stage III-IV   Diabetes mellitus   Essential hypertension   Obesity (BMI 37)   Bradycardia- beta blocker decreased   RECOMMENDATION: MD to see.  Her wgt is not too much above her baseline.  Her BNP is elevated and her CXR was read as CHF. She'll need a repeat Echo this admission. Consider renal consult (she has seen Dr Posey Pronto) if her SCr rises or she fails to diurese.   Time Spent Directly with Patient: 45 minutes  Erlene Quan [id number] beeper 07/29/2014, 5:21 PM   I have examined the  patient and reviewed assessment and plan and discussed with patient.  Agree with above as stated.  Continue Lasix for diuresis.  Decrease salt intake. Watch renal function.  She has yet to have a brisk diuresis despite IV Lasix.  She appears somewhat uncomfortable when laying flat.  Recheck echo to eval LV function.  QRS has widened.  Need to make surre LV function has not decreased as this would change management.  Add nitrates to help relieve pulmonary edema.     Fatmata Legere S.

## 2014-07-30 ENCOUNTER — Inpatient Hospital Stay (HOSPITAL_COMMUNITY): Payer: Medicare Other

## 2014-07-30 ENCOUNTER — Encounter (HOSPITAL_COMMUNITY): Payer: Self-pay | Admitting: Internal Medicine

## 2014-07-30 DIAGNOSIS — I5043 Acute on chronic combined systolic (congestive) and diastolic (congestive) heart failure: Secondary | ICD-10-CM | POA: Insufficient documentation

## 2014-07-30 DIAGNOSIS — I10 Essential (primary) hypertension: Secondary | ICD-10-CM

## 2014-07-30 DIAGNOSIS — I5033 Acute on chronic diastolic (congestive) heart failure: Secondary | ICD-10-CM

## 2014-07-30 DIAGNOSIS — G4730 Sleep apnea, unspecified: Secondary | ICD-10-CM

## 2014-07-30 LAB — GLUCOSE, CAPILLARY
Glucose-Capillary: 101 mg/dL — ABNORMAL HIGH (ref 70–99)
Glucose-Capillary: 133 mg/dL — ABNORMAL HIGH (ref 70–99)
Glucose-Capillary: 173 mg/dL — ABNORMAL HIGH (ref 70–99)
Glucose-Capillary: 163 mg/dL — ABNORMAL HIGH (ref 70–99)

## 2014-07-30 LAB — BASIC METABOLIC PANEL WITH GFR
Anion gap: 11 (ref 5–15)
BUN: 46 mg/dL — ABNORMAL HIGH (ref 6–23)
CO2: 26 mmol/L (ref 19–32)
Calcium: 9.5 mg/dL (ref 8.4–10.5)
Chloride: 107 mmol/L (ref 96–112)
Creatinine, Ser: 2.25 mg/dL — ABNORMAL HIGH (ref 0.50–1.10)
GFR calc Af Amer: 23 mL/min — ABNORMAL LOW
GFR calc non Af Amer: 20 mL/min — ABNORMAL LOW
Glucose, Bld: 120 mg/dL — ABNORMAL HIGH (ref 70–99)
Potassium: 4.2 mmol/L (ref 3.5–5.1)
Sodium: 144 mmol/L (ref 135–145)

## 2014-07-30 LAB — BLOOD GAS, ARTERIAL
Acid-Base Excess: 1.4 mmol/L (ref 0.0–2.0)
Bicarbonate: 25.9 meq/L — ABNORMAL HIGH (ref 20.0–24.0)
Drawn by: 225631
O2 Content: 3.5 L/min
O2 Saturation: 92.5 %
Patient temperature: 98.6
TCO2: 27.3 mmol/L (ref 0–100)
pCO2 arterial: 44.5 mmHg (ref 35.0–45.0)
pH, Arterial: 7.384 (ref 7.350–7.450)
pO2, Arterial: 66.4 mmHg — ABNORMAL LOW (ref 80.0–100.0)

## 2014-07-30 MED ORDER — TECHNETIUM TO 99M ALBUMIN AGGREGATED
6.0000 | Freq: Once | INTRAVENOUS | Status: AC | PRN
Start: 2014-07-30 — End: 2014-07-30
  Administered 2014-07-30: 6 via INTRAVENOUS

## 2014-07-30 MED ORDER — FUROSEMIDE 10 MG/ML IJ SOLN
80.0000 mg | Freq: Two times a day (BID) | INTRAMUSCULAR | Status: DC
  Administered 2014-07-30 – 2014-08-01 (×4): 80 mg via INTRAVENOUS
  Filled 2014-07-30 (×6): qty 8

## 2014-07-30 MED ORDER — LORAZEPAM 2 MG/ML IJ SOLN
0.5000 mg | Freq: Once | INTRAMUSCULAR | Status: AC
  Administered 2014-07-30: 0.5 mg via INTRAVENOUS
  Filled 2014-07-30: qty 1

## 2014-07-30 MED ORDER — TECHNETIUM TC 99M DIETHYLENETRIAME-PENTAACETIC ACID: 40.0000 | Freq: Once | INTRAVENOUS | Status: AC | PRN

## 2014-07-30 NOTE — Progress Notes (Signed)
Subjective:  Breathing is still short  No CP   Objective: Filed Vitals:   07/30/14 0121 07/30/14 0149 07/30/14 0300 07/30/14 0443  BP: 127/50  140/65   Pulse: 76  87   Temp: 97.9 F (36.6 C)  98 F (36.7 C)   TempSrc: Oral  Oral   Resp: 18  18   Height:      Weight:    192 lb 1.6 oz (87.136 kg)  SpO2: 97% 96% 94%    Weight change: -4 lb 12.8 oz (-2.177 kg)  Intake/Output Summary (Last 24 hours) at 07/30/14 0728 Last data filed at 07/30/14 0600  Gross per 24 hour  Intake   2023 ml  Output   2340 ml  Net   -317 ml   Net neg 1.77 L    General: Alert, awake, oriented x3, in no acute distress Neck:  JVP is normal Heart: Regular rate and rhythm, without murmurs, rubs, gallops.  Lungs: Decreased airflow  SOme crackles   Exemities:  Tr   edema.   Neuro: Grossly intact, nonfocal.  Tele:  SR with PVCs   Lab Results: Results for orders placed or performed during the hospital encounter of 07/28/14 (from the past 24 hour(s))  Glucose, capillary     Status: None   Collection Time: 07/29/14  7:49 AM  Result Value Ref Range   Glucose-Capillary 77 70 - 99 mg/dL  Troponin I     Status: Abnormal   Collection Time: 07/29/14  9:35 AM  Result Value Ref Range   Troponin I 0.07 (H) <0.031 ng/mL  Magnesium     Status: None   Collection Time: 07/29/14  9:35 AM  Result Value Ref Range   Magnesium 2.1 1.5 - 2.5 mg/dL  Glucose, capillary     Status: Abnormal   Collection Time: 07/29/14 12:28 PM  Result Value Ref Range   Glucose-Capillary 131 (H) 70 - 99 mg/dL  Blood gas, arterial     Status: Abnormal   Collection Time: 07/29/14  3:09 PM  Result Value Ref Range   O2 Content 2.0 L/min   Delivery systems NASAL CANNULA    pH, Arterial 7.384 7.350 - 7.450   pCO2 arterial 44.5 35.0 - 45.0 mmHg   pO2, Arterial 66.4 (L) 80.0 - 100.0 mmHg   Bicarbonate 25.9 (H) 20.0 - 24.0 mEq/L   TCO2 27.3 0 - 100 mmol/L   Acid-Base Excess 1.4 0.0 - 2.0 mmol/L   O2 Saturation 92.5 %   Patient  temperature 98.6    Collection site LEFT RADIAL    Drawn by [id number]    Sample type ARTERIAL    Allens test (pass/fail) PASS PASS  Glucose, capillary     Status: Abnormal   Collection Time: 07/29/14  5:06 PM  Result Value Ref Range   Glucose-Capillary 128 (H) 70 - 99 mg/dL  TSH     Status: None   Collection Time: 07/29/14  6:53 PM  Result Value Ref Range   TSH 1.984 0.350 - 4.500 uIU/mL  Glucose, capillary     Status: Abnormal   Collection Time: 07/29/14 10:11 PM  Result Value Ref Range   Glucose-Capillary 146 (H) 70 - 99 mg/dL  D-dimer, quantitative     Status: Abnormal   Collection Time: 07/29/14 10:24 PM  Result Value Ref Range   D-Dimer, Quant 1.27 (H) 0.00 - 0.48 ug/mL-FEU  Basic metabolic panel     Status: Abnormal   Collection Time: 07/30/14  3:39 AM  Result  Value Ref Range   Sodium 144 135 - 145 mmol/L   Potassium 4.2 3.5 - 5.1 mmol/L   Chloride 107 96 - 112 mmol/L   CO2 26 19 - 32 mmol/L   Glucose, Bld 120 (H) 70 - 99 mg/dL   BUN 46 (H) 6 - 23 mg/dL   Creatinine, Ser 2.25 (H) 0.50 - 1.10 mg/dL   Calcium 9.5 8.4 - 10.5 mg/dL   GFR calc non Af Amer 20 (L) >90 mL/min   GFR calc Af Amer 23 (L) >90 mL/min   Anion gap 11 5 - 15  Glucose, capillary     Status: Abnormal   Collection Time: 07/30/14  5:59 AM  Result Value Ref Range   Glucose-Capillary 101 (H) 70 - 99 mg/dL    Studies/Results: Dg Chest Port 1 View  07/29/2014   CLINICAL DATA:  Labored breathing with any activity  EXAM: PORTABLE CHEST - 1 VIEW  COMPARISON:  04/18/2014  FINDINGS: Bilateral small pleural effusions with bilateral interstitial and alveolar airspace opacities and central pulmonary vascular prominence. No pneumothorax. Stable cardiomegaly. No acute osseous abnormality.  IMPRESSION: Findings consistent with CHF.   Electronically Signed   By: Kathreen Devoid   On: 07/29/2014 15:59    Medications:Reviewed    @[facility] @   1  Acute on chronic diastolic CHF  Volume is still increased on exam  WOuld  continue diureiss Echo today.     LOS: 2 days   Dorris Carnes 07/30/2014, 7:28 AM

## 2014-07-30 NOTE — Progress Notes (Signed)
Heart Failure Navigator Consult Note  Presentation:is a 79 y.o. Female who has h/o diastolic HF. Wednesday, she developed SOB, orthopnea, and leg swelling. She did not call her PCP or cardiology. She does not weight herself daily. She is SOB at rest and worse during exertion, No CP, no fever, no chills.  She was seen by her PCP today due to the SOB and was found to have low O2 levels, worsening renal function and fluid on her legs. Her weight per their records was increase 11 pounds. Weight at last d/c on 1/19 was around 182 lbs- current weight is 197  Past Medical History  Diagnosis Date  . Hypertension   . High cholesterol   . Kidney stones   . Type II diabetes mellitus   . GERD (gastroesophageal reflux disease)   . History of gout   . CAP (community acquired pneumonia) 01/09/2014    "first time I've ever had it"  . Diastolic congestive heart failure     History   Social History  . Marital Status: Married    Spouse Name: N/A  . Number of Children: N/A  . Years of Education: N/A   Social History Main Topics  . Smoking status: Never Smoker   . Smokeless tobacco: Never Used  . Alcohol Use: No  . Drug Use: No  . Sexual Activity: Not Currently   Other Topics Concern  . None   Social History Narrative    ECHO:Study Conclusions-01/10/14  - Left ventricle: The cavity size was normal. Wall thickness was increased in a pattern of mild LVH. Systolic function was normal. The estimated ejection fraction was in the range of 50% to 55%. - Left atrium: The atrium was mildly dilated. - Pulmonary arteries: PA peak pressure: 32 mm Hg (S).  Transthoracic echocardiography. M-mode, complete 2D, spectral Doppler, and color Doppler. Birthdate: Patient birthdate: 07-Jan-1936. Age: Patient is 79 yr old. Sex: Gender: female. BMI: 39.9 kg/m^2. Blood pressure:   128/58 Patient status: Inpatient. Study date: Study date: 01/10/2014. Study time: 11:03 AM. Location:  Bedside.  BNP    Component Value Date/Time   BNP 801.5* 07/28/2014 1536    ProBNP    Component Value Date/Time   PROBNP 2767.0* 02/05/2014 1216     Education Assessment and Provision:  Detailed education and instructions provided on heart failure disease management including the following:  Signs and symptoms of Heart Failure When to call the physician Importance of daily weights Low sodium diet Fluid restriction Medication management Anticipated future follow-up appointments  Patient education given on each of the above topics.  Patient acknowledges understanding and acceptance of all instructions.  I spoke briefly with patient's daughter regarding her HF.  The patient slept during my entire conversation with her daughter.  Her daughter says that she does weigh daily.  We discussed the importance of daily weights in relation to signs and symptoms of HF.   She also thinks that she has no issue getting or taking prescribed medications. The daughter says that she sees [facility] outpatient.  I will plan to come back to hopefully speak in person with Ms. Krajewski to reinforce education.  Education Materials:  "Living Better With Heart Failure" Booklet, Daily Weight Tracker Tool and Heart Failure Educational Video.   High Risk Criteria for Readmission and/or Poor Patient Outcomes:   EF <30%- No-50-55%  2 or more admissions in 6 months- Yes  Difficult social situation- No-lives with husband  Demonstrates medication noncompliance- No   Barriers of Care:  Knowledge and compliance  Discharge Planning: Plans to discharge to home with husband

## 2014-07-30 NOTE — Progress Notes (Signed)
[facility] PROGRESS NOTE  Doris Howell [id number] DOB: [birth year] DOA: 07/28/2014 PCP: Irven Shelling, MD  Brief Summary   79 year old female with history of diastolic heart failure , type 2 diabetes mellitus, hypertension, hyperlipidemia, GERD who presented with shortness of breath, orthopnea, and lower extremity edema. Her weight had increased by approximately 11 pounds.   Cardiology was consulted and she has been started on IV diuresis.  Assessment/Plan  Acute hypoxic respiratory failure secondary to acute on chronic diastolic congestive heart failure -   Weight trending down by 1-2kg  Since admission -   -317 ML's +1 void yesterday -   Increase Lasix to 80 mg IV twice a day -   Appreciate cardiology assistance -   VQ scan negative for pulmonary embolism   Hypertension , blood pressure stable to mildly elevated -   Continue Norvasc, carvedilol -   Unclear why she was started on Imdur but may have been due to hypertension  Chronic kidney disease stage IV , Creatinine at baseline of 2.2-2.3 -   Minimize nephrotoxins and renally dose medications -   Trend creatinine will diuresed   Hypokalemia due to Lasix use resolved with oral supplementation   Obstructive sleep apnea, stable, continue CPAP  Type 2 diabetes mellitus with  Complication of chronic  kidney disease , CBG at goal -   Continue low-dose sliding scale insulin -   Continue to hold sulfonylurea  Diet:   Diabetic, low sodium Access:   PIV IVF:   off Proph:   heparin  Code Status:  full Family Communication:  Patient alone Disposition Plan:  Pending further improvement in dyspnea   Consultants:   cardiology, Dr. Harrington Challenger  Procedures:   chest x-ray   VQ scan  Antibiotics:   none   HPI/Subjective:   patient states that she feels much better today but was very Ladon Heney of breath yesterday and overnight. She continues to have shortness of breath at rest. She feels like her abdomen is  distended    Objective: Filed Vitals:   07/30/14 0443 07/30/14 1133 07/30/14 1430 07/30/14 1819  BP:  117/52 138/61 140/63  Pulse:  66 68 75  Temp:      TempSrc:      Resp:      Height:      Weight: 87.136 kg (192 lb 1.6 oz)     SpO2:        Intake/Output Summary (Last 24 hours) at 07/30/14 1946 Last data filed at 07/30/14 1653  Gross per 24 hour  Intake    603 ml  Output   2725 ml  Net  -2122 ml   Filed Weights   07/28/14 2002 07/29/14 0615 07/30/14 0443  Weight: 88.633 kg (195 lb 6.4 oz) 87.544 kg (193 lb) 87.136 kg (192 lb 1.6 oz)    Exam:   General:   Overweight female,  Mild respiratory distress with occasional SCM retractions  HEENT:  NCAT, MMM  Cardiovascular:  RRR, nl S1, S2 no mrg, 2+ pulses, warm extremities  Respiratory:   Diminished bilateral breath sounds with faint rales at the bilateral bases, no increased WOB  Abdomen:   NABS, soft,  Mildly distended, nontender  MSK:   Normal tone and bulk, no LEE  Neuro:  Grossly intact  Data Reviewed: Basic Metabolic Panel:  Recent Labs Lab 07/28/14 1536 07/28/14 2055 07/29/14 0302 07/29/14 0935 07/30/14 0339  NA 141  --  145  --  144  K 3.4*  --  3.4*  --  4.2  CL 106  --  108  --  107  CO2 22  --  26  --  26  GLUCOSE 112*  --  124*  --  120*  BUN 48*  --  45*  --  46*  CREATININE 2.27*  --  2.24*  --  2.25*  CALCIUM 9.8  --  9.2  --  9.5  MG  --  2.1  --  2.1  --    Liver Function Tests: No results for input(s): AST, ALT, ALKPHOS, BILITOT, PROT, ALBUMIN in the last 168 hours. No results for input(s): LIPASE, AMYLASE in the last 168 hours. No results for input(s): AMMONIA in the last 168 hours. CBC:  Recent Labs Lab 07/28/14 1536  WBC 5.5  NEUTROABS 3.9  HGB 10.0*  HCT 31.3*  MCV 88.2  PLT 244   Cardiac Enzymes:  Recent Labs Lab 07/28/14 2055 07/29/14 0302 07/29/14 0935  TROPONINI 0.07* 0.06* 0.07*   BNP (last 3 results)  Recent Labs  04/10/14 2350 04/15/14 0408  07/28/14 1536  BNP 909.9* 591.0* 801.5*    ProBNP (last 3 results)  Recent Labs  01/09/14 1515 02/05/14 1216  PROBNP 2595.0* 2767.0*    CBG:  Recent Labs Lab 07/29/14 1706 07/29/14 2211 07/30/14 0559 07/30/14 1134 07/30/14 1653  GLUCAP 128* 146* 101* 133* 173*    No results found for this or any previous visit (from the past 240 hour(s)).   Studies: Nm Pulmonary Perf And Vent  07/30/2014   CLINICAL DATA:  79 year old female with 2 day history of shortness of breath  EXAM: NUCLEAR MEDICINE VENTILATION - PERFUSION LUNG SCAN  TECHNIQUE: Ventilation images were obtained in multiple projections using inhaled aerosol technetium 99 M DTPA. Perfusion images were obtained in multiple projections after intravenous injection of Tc-38m MAA.  RADIOPHARMACEUTICALS:  40 mCi Tc-71m DTPA aerosol and 6 mCi Tc-57m MAA  COMPARISON:  Prior nuclear medicine V/Q scan 02/07/2014  FINDINGS: Ventilation: No focal ventilation defect.  Perfusion: No wedge shaped peripheral perfusion defects to suggest acute pulmonary embolism.  IMPRESSION: Negative for evidence of acute pulmonary embolus.  Signed,  Criselda Peaches, MD  [facility]  [facility]   Electronically Signed   By: Jacqulynn Cadet M.D.   On: 07/30/2014 15:42   Dg Chest Port 1 View  07/29/2014   CLINICAL DATA:  Labored breathing with any activity  EXAM: PORTABLE CHEST - 1 VIEW  COMPARISON:  04/18/2014  FINDINGS: Bilateral small pleural effusions with bilateral interstitial and alveolar airspace opacities and central pulmonary vascular prominence. No pneumothorax. Stable cardiomegaly. No acute osseous abnormality.  IMPRESSION: Findings consistent with CHF.   Electronically Signed   By: Kathreen Devoid   On: 07/29/2014 15:59    Scheduled Meds: . allopurinol  100 mg Oral BID  . amLODipine  10 mg Oral q morning - 10a  . aspirin EC  81 mg Oral Daily  . carvedilol  12.5 mg Oral BID WC  . cholecalciferol   2,000 Units Oral Daily  . furosemide  80 mg Intravenous BID  . heparin  5,000 Units Subcutaneous 3 times per day  . insulin aspart  0-5 Units Subcutaneous QHS  . insulin aspart  0-9 Units Subcutaneous TID WC  . isosorbide mononitrate  30 mg Oral Daily  . potassium chloride  40 mEq Oral BID  . sodium chloride  3 mL Intravenous Q12H   Continuous Infusions:   Principal Problem:   Diastolic CHF, acute on  chronic Active Problems:   Acute respiratory failure with hypoxia   CKD (chronic kidney disease), stage III   Diabetes mellitus   Essential hypertension   Obesity (BMI 37)   Bradycardia- beta blocker decreased   Sleep apnea    Time spent: 30 min    Sangeeta Youse, [facility] Pager [phone]. If 7PM-7AM, please contact night-coverage at [email or web address], password [facility] 07/30/2014, 7:46 PM  LOS: 2 days

## 2014-07-31 DIAGNOSIS — I5043 Acute on chronic combined systolic (congestive) and diastolic (congestive) heart failure: Principal | ICD-10-CM

## 2014-07-31 DIAGNOSIS — R0600 Dyspnea, unspecified: Secondary | ICD-10-CM

## 2014-07-31 DIAGNOSIS — J9601 Acute respiratory failure with hypoxia: Secondary | ICD-10-CM

## 2014-07-31 DIAGNOSIS — E1122 Type 2 diabetes mellitus with diabetic chronic kidney disease: Secondary | ICD-10-CM

## 2014-07-31 DIAGNOSIS — N184 Chronic kidney disease, stage 4 (severe): Secondary | ICD-10-CM

## 2014-07-31 LAB — BASIC METABOLIC PANEL
Anion gap: 12 (ref 5–15)
BUN: 47 mg/dL — ABNORMAL HIGH (ref 6–23)
CO2: 28 mmol/L (ref 19–32)
Calcium: 9.8 mg/dL (ref 8.4–10.5)
Chloride: 105 mmol/L (ref 96–112)
Creatinine, Ser: 2.24 mg/dL — ABNORMAL HIGH (ref 0.50–1.10)
GFR calc Af Amer: 23 mL/min — ABNORMAL LOW (ref >90)
GFR calc non Af Amer: 20 mL/min — ABNORMAL LOW (ref >90)
Glucose, Bld: 94 mg/dL (ref 70–99)
Potassium: 4.6 mmol/L (ref 3.5–5.1)
Sodium: 145 mmol/L (ref 135–145)

## 2014-07-31 LAB — GLUCOSE, CAPILLARY
Glucose-Capillary: 92 mg/dL (ref 70–99)
Glucose-Capillary: 232 mg/dL — ABNORMAL HIGH (ref 70–99)
Glucose-Capillary: 105 mg/dL — ABNORMAL HIGH (ref 70–99)
Glucose-Capillary: 167 mg/dL — ABNORMAL HIGH (ref 70–99)

## 2014-07-31 MED ORDER — HYDRALAZINE HCL 10 MG PO TABS
10.0000 mg | ORAL_TABLET | Freq: Three times a day (TID) | ORAL | Status: DC
  Administered 2014-07-31 – 2014-08-02 (×5): 10 mg via ORAL
  Filled 2014-07-31 (×8): qty 1

## 2014-07-31 MED ORDER — DOCUSATE SODIUM 100 MG PO CAPS
100.0000 mg | ORAL_CAPSULE | Freq: Two times a day (BID) | ORAL | Status: DC
  Administered 2014-07-31 – 2014-08-02 (×4): 100 mg via ORAL
  Filled 2014-07-31 (×5): qty 1

## 2014-07-31 MED ORDER — CARVEDILOL 12.5 MG PO TABS
12.5000 mg | ORAL_TABLET | Freq: Two times a day (BID) | ORAL | Status: DC
  Administered 2014-07-31 – 2014-08-02 (×5): 12.5 mg via ORAL
  Filled 2014-07-31 (×7): qty 1

## 2014-07-31 NOTE — Progress Notes (Signed)
Doris Howell Name: Doris Howell Date of Encounter: 07/31/2014     Principal Problem:   Diastolic CHF, acute on chronic Active Problems:   Acute respiratory failure with hypoxia   CKD (chronic kidney disease), stage IV   Type 2 diabetes mellitus with stage 4 chronic kidney disease   Essential hypertension   Obesity (BMI 37)   Bradycardia- beta blocker decreased   Sleep apnea   Acute on chronic combined systolic and diastolic CHF (congestive heart failure)    SUBJECTIVE  Didn't sleep well last night due to CPAP. breathing improved but not back to baseline.   CURRENT MEDS . allopurinol  100 mg Oral BID  . amLODipine  10 mg Oral q morning - 10a  . aspirin EC  81 mg Oral Daily  . carvedilol  12.5 mg Oral BID WC  . cholecalciferol  2,000 Units Oral Daily  . furosemide  80 mg Intravenous BID  . heparin  5,000 Units Subcutaneous 3 times per day  . insulin aspart  0-5 Units Subcutaneous QHS  . insulin aspart  0-9 Units Subcutaneous TID WC  . isosorbide mononitrate  30 mg Oral Daily  . potassium chloride  40 mEq Oral BID  . sodium chloride  3 mL Intravenous Q12H    OBJECTIVE  Filed Vitals:   07/30/14 2121 07/30/14 2324 07/31/14 0048 07/31/14 0546  BP: 130/56   121/63  Pulse: 71   77  Temp: 98 F (36.7 C)   98.8 F (37.1 C)  TempSrc: Oral   Axillary  Resp: 20   21  Height:      Weight:    186 lb (84.369 kg)  SpO2: 93% 97% 100% 94%    Intake/Output Summary (Last 24 hours) at 07/31/14 1242 Last data filed at 07/31/14 [id number]  Gross per 24 hour  Intake    520 ml  Output   2375 ml  Net  -1855 ml   Filed Weights   07/29/14 0615 07/30/14 0443 07/31/14 0546  Weight: 193 lb (87.544 kg) 192 lb 1.6 oz (87.136 kg) 186 lb (84.369 kg)    PHYSICAL EXAM  General: Pleasant, NAD. Neuro: Alert and oriented X 3. Moves all extremities spontaneously. Psych: Normal affect. HEENT:  Normal  Neck: Supple without bruits or JVD. Lungs:  Resp regular and unlabored, crackles at  bases Heart: RRR no s3, s4, or murmurs. Abdomen: Soft, non-tender, non-distended, BS + x 4.  Extremities: No clubbing, cyanosis. trace edema. DP/PT/Radials 2+ and equal bilaterally.  Accessory Clinical Findings  CBC  Recent Labs  07/28/14 1536  WBC 5.5  NEUTROABS 3.9  HGB 10.0*  HCT 31.3*  MCV 88.2  PLT [id number]   Basic Metabolic Panel  Recent Labs  07/28/14 2055  07/29/14 0935 07/30/14 0339 07/31/14 0542  NA  --   < >  --  144 145  Doris  --   < >  --  4.2 4.6  CL  --   < >  --  107 105  CO2  --   < >  --  26 28  GLUCOSE  --   < >  --  120* 94  BUN  --   < >  --  46* 47*  CREATININE  --   < >  --  2.25* 2.24*  CALCIUM  --   < >  --  9.5 9.8  MG 2.1  --  2.1  --   --   < > = values in this  interval not displayed.   Cardiac Enzymes  Recent Labs  07/28/14 2055 07/29/14 0302 07/29/14 0935  TROPONINI 0.07* 0.06* 0.07*   BNP Invalid input(s): POCBNP D-Dimer  Recent Labs  07/29/14 2224  DDIMER 1.27*   Thyroid Function Tests  Recent Labs  07/29/14 1853  TSH 1.984    TELE  NSR wit some PVCS. LBBB  Radiology/Studies  Nm Pulmonary Perf And Vent  07/30/2014   CLINICAL DATA:  79 year old female with 2 day history of shortness of breath  EXAM: NUCLEAR MEDICINE VENTILATION - PERFUSION LUNG SCAN  TECHNIQUE: Ventilation images were obtained in multiple projections using inhaled aerosol technetium 99 M DTPA. Perfusion images were obtained in multiple projections after intravenous injection of Tc-66m MAA.  RADIOPHARMACEUTICALS:  40 mCi Tc-83m DTPA aerosol and 6 mCi Tc-84m MAA  COMPARISON:  Prior nuclear medicine V/Q scan 02/07/2014  FINDINGS: Ventilation: No focal ventilation defect.  Perfusion: No wedge shaped peripheral perfusion defects to suggest acute pulmonary embolism.  IMPRESSION: Negative for evidence of acute pulmonary embolus.  Signed,  Criselda Peaches, MD  [facility]  [facility]   Electronically Signed   By:  Jacqulynn Cadet M.D.   On: 07/30/2014 15:42   Dg Chest Port 1 View  07/29/2014   CLINICAL DATA:  Labored breathing with any activity  EXAM: PORTABLE CHEST - 1 VIEW  COMPARISON:  04/18/2014  FINDINGS: Bilateral small pleural effusions with bilateral interstitial and alveolar airspace opacities and central pulmonary vascular prominence. No pneumothorax. Stable cardiomegaly. No acute osseous abnormality.  IMPRESSION: Findings consistent with CHF.   Electronically Signed   By: Kathreen Devoid   On: 07/29/2014 15:59    ASSESSMENT AND PLAN  [name] is a 79 y.o. female with a history of diastolic heart failure, type 2 diabetes mellitus, hypertension, hyperlipidemia, GERD who presented with shortness of breath, orthopnea, and lower extremity edema. Her weight had increased by approximately 11 pounds.  Acute on chronic diastolic CHF -- Repeat 2D ECHO done 10 minutes ago. Not yet read. -- Cont lasix to 80 mg IV twice a day. Net neg 3.7L. Weight down 10 lbs. ( 196--> 186 lbs. ). Still with evidence of volume overload. Continue IV diuresis.  -- DDimer elevated but VQ scan negative for pulmonary embolism -- Imdur added to help relieve pulmonary edema  Hypertension  --Continue Norvasc and carvedilol  Chronic kidney disease stage IV , Creatinine at baseline of 2.2-2.3 -- Creat stable at 2.24. Continue to monitor in the setting of IV diuresis.  Obstructive sleep apnea stable -- Continue CPAP  Type 2 diabetes mellitus -- Per IM   Doris Pimple PA-C  Pager [phone]  Patinet seen and exmained  I agree with findings of Doris Howell above  She is diuresing and is improving  Still with evid of volume increase on exam

## 2014-07-31 NOTE — Progress Notes (Signed)
Pt. Refused cpap. RT informed pt. To notify if she changes her mind. 

## 2014-07-31 NOTE — Progress Notes (Signed)
[facility] PROGRESS NOTE  GRACIELLA Howell [id number] DOB: 01-Feb-1936 DOA: 07/28/2014 PCP: Irven Shelling, MD  Brief Summary   79 year old female with history of diastolic heart failure, type 2 diabetes mellitus, hypertension, hyperlipidemia, GERD who presented with shortness of breath, orthopnea, and lower extremity edema. Her weight had increased by approximately 11 pounds.  Cardiology was consulted and she has been started on IV diuresis.    Assessment/Plan  Acute hypoxic respiratory failure secondary to acute on chronic diastolic and systolic congestive heart failure -   Weight down 3kg over last 24 hours -  -1.89L yesterday -   Continue Lasix to 80 mg IV twice a day -   Appreciate cardiology assistance -   VQ scan negative for pulmonary embolism -   imdur start for heart failure afterload reduction -  ECHO:  EF 45-50%  With diffuse hypokinesis,  Grade 2 diastolic dysfunction , elevated ventricular end-diastolic filling pressure, normal RV , trivial pericardial effusion without taper not the physiology -  On BB, no ACEI or ARB or spironolactone due to creatinine, but recommend adding hydralazine   Hypertension , blood pressure stable -   Continue Norvasc, carvedilol  Chronic kidney disease stage IV , Creatinine at baseline of 2.2-2.3 -   Minimize nephrotoxins and renally dose medications -   Trend creatinine will diuresed    Hypokalemia due to Lasix use resolved with oral supplementation   Obstructive sleep apnea, stable, continue CPAP  Type 2 diabetes mellitus with  Complication of chronic  kidney disease, CBG near goal -   Continue low-dose sliding scale insulin -   Continue to hold sulfonylurea  Diet:   Diabetic, low sodium Access:   PIV IVF:   off Proph:   heparin  Code Status:  full Family Communication:  Patient alone  Disposition Plan:  Pending further diuresis   Consultants:   cardiology, Dr. Harrington Challenger  Procedures:   chest x-ray   VQ  scan  Antibiotics:   none   HPI/Subjective:  Patient states that she feels much better today.  No longer SOB at rest, swelling decreasing.  . She feels like her abdomen is distended    Objective: Filed Vitals:   07/30/14 2324 07/31/14 0048 07/31/14 0546 07/31/14 1300  BP:   121/63 129/57  Pulse:   77 68  Temp:   98.8 F (37.1 C) 98.2 F (36.8 C)  TempSrc:   Axillary Oral  Resp:   21 18  Height:      Weight:   84.369 kg (186 lb)   SpO2: 97% 100% 94% 97%    Intake/Output Summary (Last 24 hours) at 07/31/14 1531 Last data filed at 07/31/14 1500  Gross per 24 hour  Intake   1000 ml  Output   3700 ml  Net  -2700 ml   Filed Weights   07/29/14 0615 07/30/14 0443 07/31/14 0546  Weight: 87.544 kg (193 lb) 87.136 kg (192 lb 1.6 oz) 84.369 kg (186 lb)    Exam:   General:   Overweight female, NAD  HEENT:  NCAT, MMM  Cardiovascular:  RRR, nl S1, S2 no mrg, 2+ pulses, warm extremities  Respiratory:   Diminished bilateral breath sounds with persistent rales at the bilateral bases, no increased WOB  Abdomen:   NABS, soft,  Mildly distended, nontender  MSK:   Normal tone and bulk, no LEE  Neuro:  Grossly intact  Data Reviewed: Basic Metabolic Panel:  Recent Labs Lab 07/28/14 1536 07/28/14 2055 07/29/14 0302 07/29/14 0935  07/30/14 0339 07/31/14 0542  NA 141  --  145  --  144 145  K 3.4*  --  3.4*  --  4.2 4.6  CL 106  --  108  --  107 105  CO2 22  --  26  --  26 28  GLUCOSE 112*  --  124*  --  120* 94  BUN 48*  --  45*  --  46* 47*  CREATININE 2.27*  --  2.24*  --  2.25* 2.24*  CALCIUM 9.8  --  9.2  --  9.5 9.8  MG  --  2.1  --  2.1  --   --    Liver Function Tests: No results for input(s): AST, ALT, ALKPHOS, BILITOT, PROT, ALBUMIN in the last 168 hours. No results for input(s): LIPASE, AMYLASE in the last 168 hours. No results for input(s): AMMONIA in the last 168 hours. CBC:  Recent Labs Lab 07/28/14 1536  WBC 5.5  NEUTROABS 3.9  HGB 10.0*  HCT  31.3*  MCV 88.2  PLT 244   Cardiac Enzymes:  Recent Labs Lab 07/28/14 2055 07/29/14 0302 07/29/14 0935  TROPONINI 0.07* 0.06* 0.07*   BNP (last 3 results)  Recent Labs  04/10/14 2350 04/15/14 0408 07/28/14 1536  BNP 909.9* 591.0* 801.5*    ProBNP (last 3 results)  Recent Labs  01/09/14 1515 02/05/14 1216  PROBNP 2595.0* 2767.0*    CBG:  Recent Labs Lab 07/30/14 1134 07/30/14 1653 07/30/14 2120 07/31/14 0612 07/31/14 1147  GLUCAP 133* 173* 163* 92 232*    No results found for this or any previous visit (from the past 240 hour(s)).   Studies: Nm Pulmonary Perf And Vent  07/30/2014   CLINICAL DATA:  79 year old female with 2 day history of shortness of breath  EXAM: NUCLEAR MEDICINE VENTILATION - PERFUSION LUNG SCAN  TECHNIQUE: Ventilation images were obtained in multiple projections using inhaled aerosol technetium 99 M DTPA. Perfusion images were obtained in multiple projections after intravenous injection of Tc-65m MAA.  RADIOPHARMACEUTICALS:  40 mCi Tc-40m DTPA aerosol and 6 mCi Tc-88m MAA  COMPARISON:  Prior nuclear medicine V/Q scan 02/07/2014  FINDINGS: Ventilation: No focal ventilation defect.  Perfusion: No wedge shaped peripheral perfusion defects to suggest acute pulmonary embolism.  IMPRESSION: Negative for evidence of acute pulmonary embolus.  Signed,  Criselda Peaches, MD  [facility]  [facility]   Electronically Signed   By: Jacqulynn Cadet M.D.   On: 07/30/2014 15:42   Dg Chest Port 1 View  07/29/2014   CLINICAL DATA:  Labored breathing with any activity  EXAM: PORTABLE CHEST - 1 VIEW  COMPARISON:  04/18/2014  FINDINGS: Bilateral small pleural effusions with bilateral interstitial and alveolar airspace opacities and central pulmonary vascular prominence. No pneumothorax. Stable cardiomegaly. No acute osseous abnormality.  IMPRESSION: Findings consistent with CHF.   Electronically Signed   By: Kathreen Devoid   On: 07/29/2014 15:59    Scheduled Meds: . allopurinol  100 mg Oral BID  . amLODipine  10 mg Oral q morning - 10a  . aspirin EC  81 mg Oral Daily  . carvedilol  12.5 mg Oral BID WC  . cholecalciferol  2,000 Units Oral Daily  . furosemide  80 mg Intravenous BID  . heparin  5,000 Units Subcutaneous 3 times per day  . insulin aspart  0-5 Units Subcutaneous QHS  . insulin aspart  0-9 Units Subcutaneous TID WC  . isosorbide mononitrate  30 mg Oral Daily  . potassium chloride  40 mEq Oral BID  . sodium chloride  3 mL Intravenous Q12H   Continuous Infusions:   Principal Problem:   Diastolic CHF, acute on chronic Active Problems:   Acute respiratory failure with hypoxia   CKD (chronic kidney disease), stage IV   Type 2 diabetes mellitus with stage 4 chronic kidney disease   Essential hypertension   Obesity (BMI 37)   Bradycardia- beta blocker decreased   Sleep apnea   Acute on chronic combined systolic and diastolic CHF (congestive heart failure)    Time spent: 30 min    Celes Dedic, [place] Pager [phone]. If 7PM-7AM, please contact night-coverage at [email or web address], password [facility] 07/31/2014, 3:31 PM  LOS: 3 days

## 2014-07-31 NOTE — Progress Notes (Signed)
  Echocardiogram 2D Echocardiogram has been performed.  Doris Howell 07/31/2014, 1:00 PM

## 2014-08-01 LAB — GLUCOSE, CAPILLARY
Glucose-Capillary: 113 mg/dL — ABNORMAL HIGH (ref 70–99)
Glucose-Capillary: 190 mg/dL — ABNORMAL HIGH (ref 70–99)
Glucose-Capillary: 143 mg/dL — ABNORMAL HIGH (ref 70–99)
Glucose-Capillary: 156 mg/dL — ABNORMAL HIGH (ref 70–99)

## 2014-08-01 LAB — BASIC METABOLIC PANEL
Anion gap: 10 (ref 5–15)
BUN: 50 mg/dL — ABNORMAL HIGH (ref 6–23)
CO2: 31 mmol/L (ref 19–32)
Calcium: 9.3 mg/dL (ref 8.4–10.5)
Chloride: 100 mmol/L (ref 96–112)
Creatinine, Ser: 2.42 mg/dL — ABNORMAL HIGH (ref 0.50–1.10)
GFR calc Af Amer: 21 mL/min — ABNORMAL LOW (ref >90)
GFR calc non Af Amer: 18 mL/min — ABNORMAL LOW (ref >90)
Glucose, Bld: 111 mg/dL — ABNORMAL HIGH (ref 70–99)
Potassium: 4.8 mmol/L (ref 3.5–5.1)
Sodium: 141 mmol/L (ref 135–145)

## 2014-08-01 MED ORDER — LATANOPROST 0.005 % OP SOLN
1.0000 [drp] | Freq: Every day | OPHTHALMIC | Status: DC
  Administered 2014-08-01: 1 [drp] via OPHTHALMIC
  Filled 2014-08-01: qty 2.5

## 2014-08-01 MED ORDER — FUROSEMIDE 20 MG PO TABS
20.0000 mg | ORAL_TABLET | Freq: Every day | ORAL | Status: DC
  Filled 2014-08-01: qty 1

## 2014-08-01 MED ORDER — FUROSEMIDE 40 MG PO TABS
40.0000 mg | ORAL_TABLET | Freq: Once | ORAL | Status: AC
  Administered 2014-08-01: 40 mg via ORAL
  Filled 2014-08-01: qty 1

## 2014-08-01 MED ORDER — FUROSEMIDE 40 MG PO TABS
40.0000 mg | ORAL_TABLET | Freq: Every day | ORAL | Status: DC
  Administered 2014-08-02: 40 mg via ORAL
  Filled 2014-08-01: qty 1

## 2014-08-01 NOTE — Evaluation (Signed)
Physical Therapy Evaluation Patient Details Name: Doris Howell MRN: [id number] DOB: 08-Sep-1935 Today's Date: 08/01/2014   History of Present Illness  79 year old female with history of diastolic heart failure, type 2 diabetes mellitus, hypertension, hyperlipidemia, GERD who presented with shortness of breath, orthopnea, and lower extremity edema. Her weight had increased by approximately 11 pounds.   Clinical Impression  Pt admitted with above diagnosis. Pt currently with functional limitations due to the deficits listed below (see PT Problem List). Pt will need HHPT f/u.  Needs to use RW initially as welll and has one.  Daughter aware or recommendations.  Will follow acutely.   Pt will benefit from skilled PT to increase their independence and safety with mobility to allow discharge to the venue listed below.      Follow Up Recommendations Home health PT;Supervision/Assistance - 24 hour    Equipment Recommendations  Other (comment) (may get a cane but will purchase out of pocket if so)    Recommendations for Other Services       Precautions / Restrictions Precautions Precautions: Fall Restrictions Weight Bearing Restrictions: No      Mobility  Bed Mobility Overal bed mobility: Needs Assistance Bed Mobility: Supine to Sit     Supine to sit: Min assist     General bed mobility comments: A for elevation of trunk.  Transfers Overall transfer level: Needs assistance Equipment used: Rolling walker (2 wheeled) Transfers: Sit to/from Stand Sit to Stand: Min assist         General transfer comment: Needed cues for hand placement.  Steadying assist upon standing.  Does well with RW.   Ambulation/Gait Ambulation/Gait assistance: Min guard Ambulation Distance (Feet): 45 Feet Assistive device: Rolling walker (2 wheeled) Gait Pattern/deviations: Step-through pattern;Decreased stride length;Trunk flexed   Gait velocity interpretation: Below normal speed for  age/gender General Gait Details: Pt able to ambulate well with RW with occasional cues to stay close to RW.  Pt can borrow her sisters RW.  Without RW pt does reach for furniture and need some assist.  HAd a long discussion with pt to use RW at home for safety.  Daughter agrees but states pt hasn't used RW in the past.  Pt asked if she used RW in home, could she use cane outside of home.  PT informed pt that the best thing to do would be to use the RW initially and when she has progressed at home the HHPT could tell her when she is ready to progress to cane.  Discussed the differences in a quad cane vs. a strt cane as well as pt and daughter asking.    Stairs            Wheelchair Mobility    Modified Rankin (Stroke Patients Only)       Balance Overall balance assessment: Needs assistance;History of Falls Sitting-balance support: No upper extremity supported;Feet supported Sitting balance-Leahy Scale: Fair     Standing balance support: Bilateral upper extremity supported;During functional activity Standing balance-Leahy Scale: Poor Standing balance comment: Needs UE support for balance.                              Pertinent Vitals/Pain Pain Assessment: No/denies pain  Sats on 5L on arrival 97%. Nurse came in and turned O2 to 4L with sat 93-95% as they are starting to wean pt.      Home Living Family/patient expects to be discharged to:: Private residence Living  Arrangements: Spouse/significant other Available Help at Discharge: Family;Available PRN/intermittently Type of Home: House Home Access: Stairs to enter Entrance Stairs-Rails: None Entrance Stairs-Number of Steps: 1 Home Layout: One level Home Equipment: Walker - 2 wheels;Toilet riser Additional Comments: pt indicates her husband has visual deficits.      Prior Function Level of Independence: Needs assistance   Gait / Transfers Assistance Needed: Per daughter, pt was not using device but was  staggering at times.  She felt that pt needs device.    ADL's / Homemaking Assistance Needed: sponge bathes per pt due to instability        Hand Dominance        Extremity/Trunk Assessment   Upper Extremity Assessment: Defer to OT evaluation           Lower Extremity Assessment: Generalized weakness         Communication   Communication: No difficulties  Cognition Arousal/Alertness: Awake/alert Behavior During Therapy: WFL for tasks assessed/performed Overall Cognitive Status: Within Functional Limits for tasks assessed Area of Impairment: Safety/judgement;Awareness         Safety/Judgement: Decreased awareness of safety;Decreased awareness of deficits Awareness: Intellectual        General Comments      Exercises General Exercises - Lower Extremity Ankle Circles/Pumps: AROM;Both;10 reps;Seated Long Arc Quad: AROM;Both;10 reps;Seated Hip Flexion/Marching: AROM;Both;10 reps;Seated      Assessment/Plan    PT Assessment Patient needs continued PT services  PT Diagnosis Generalized weakness   PT Problem List Decreased activity tolerance;Decreased balance;Decreased mobility;Decreased knowledge of use of DME;Decreased safety awareness;Decreased knowledge of precautions  PT Treatment Interventions DME instruction;Gait training;Stair training;Functional mobility training;Therapeutic activities;Therapeutic exercise;Balance training;Patient/family education   PT Goals (Current goals can be found in the Care Plan section) Acute Rehab PT Goals Patient Stated Goal: to go home PT Goal Formulation: With patient Time For Goal Achievement: 08/08/14 Potential to Achieve Goals: Good    Frequency Min 3X/week   Barriers to discharge        Co-evaluation               End of Session Equipment Utilized During Treatment: Gait belt;Oxygen Activity Tolerance: Patient limited by fatigue Patient left: in chair;with call bell/phone within reach;with family/visitor  present Nurse Communication: Mobility status         Time: [id number] PT Time Calculation (min) (ACUTE ONLY): 31 min   Charges:   PT Evaluation $Initial PT Evaluation Tier I: 1 Procedure PT Treatments $Gait Training: 8-22 mins   PT G CodesDenice Paradise Aug 20, 2014, 1:17 PM [employer] Acute Rehabilitation [phone] (pager)

## 2014-08-01 NOTE — Plan of Care (Signed)
Problem: Limited Adherence to Nutrition-Related Recommendations (NB-1.6) Goal: Nutrition education Formal process to instruct or train a patient/client in a skill or to impart knowledge to help patients/clients voluntarily manage or modify food choices and eating behavior to maintain or improve health. Outcome: Completed/Met Date Met:  08/01/14  RD consulted for nutrition education.  RD provided "Heart Healthy Nutrition Therapy" handout from the [employer]. Reviewed patient's dietary recall. Provided examples on ways to decrease sodium and fat intake in diet. Discouraged intake of processed foods and use of salt shaker. Encouraged fresh fruits and vegetables as well as whole grain sources of carbohydrates to maximize fiber intake. Teach back method used.  Expect fair compliance.  Body mass index is 35.11 kg/(m^2). Pt meets criteria for Obesity Class II based on current BMI.  Current diet order is Heart Healthy, patient is consuming approximately 80-100% of meals at this time. Labs and medications reviewed. No further nutrition interventions warranted at this time. If additional nutrition issues arise, please re-consult RD.  Arthur Holms, RD, LDN Pager #: [phone] After-Hours Pager #: [phone]

## 2014-08-01 NOTE — Progress Notes (Signed)
Patient Name: Doris Howell Date of Encounter: 08/01/2014   SUBJECTIVE  Breathing improved. Feels like at baseline. No CP or SOB.   CURRENT MEDS . allopurinol  100 mg Oral BID  . amLODipine  10 mg Oral q morning - 10a  . aspirin EC  81 mg Oral Daily  . carvedilol  12.5 mg Oral BID WC  . cholecalciferol  2,000 Units Oral Daily  . docusate sodium  100 mg Oral BID  . furosemide  80 mg Intravenous BID  . heparin  5,000 Units Subcutaneous 3 times per day  . hydrALAZINE  10 mg Oral TID  . insulin aspart  0-5 Units Subcutaneous QHS  . insulin aspart  0-9 Units Subcutaneous TID WC  . isosorbide mononitrate  30 mg Oral Daily  . potassium chloride  40 mEq Oral BID  . sodium chloride  3 mL Intravenous Q12H    OBJECTIVE  Filed Vitals:   07/31/14 0546 07/31/14 1300 07/31/14 2140 08/01/14 0601  BP: 121/63 129/57 137/81 127/56  Pulse: 77 68 72 69  Temp: 98.8 F (37.1 C) 98.2 F (36.8 C) 98 F (36.7 C) 99.4 F (37.4 C)  TempSrc: Axillary Oral Oral Oral  Resp: 21 18 22 20   Height:      Weight: 186 lb (84.369 kg)   185 lb 11.2 oz (84.233 kg)  SpO2: 94% 97% 96% 98%    Intake/Output Summary (Last 24 hours) at 08/01/14 1110 Last data filed at 08/01/14 1101  Gross per 24 hour  Intake   2560 ml  Output   2600 ml  Net    -40 ml   Filed Weights   07/30/14 0443 07/31/14 0546 08/01/14 0601  Weight: 192 lb 1.6 oz (87.136 kg) 186 lb (84.369 kg) 185 lb 11.2 oz (84.233 kg)    PHYSICAL EXAM  General: Pleasant, NAD. Neuro: Alert and oriented X 3. Moves all extremities spontaneously. Psych: Normal affect. HEENT:  Normal  Neck: Supple without bruits or JVD. Lungs:  Resp regular and unlabored, faint crackle at base.  Heart: RRR no s3, s4, or murmurs. Abdomen: Soft, non-tender, non-distended, BS + x 4.  Extremities: No clubbing, cyanosis. Trace edema. DP/PT/Radials 2+ and equal bilaterally.  Accessory Clinical Findings  Basic Metabolic Panel  Recent Labs  07/31/14 0542  08/01/14 0530  NA 145 141  K 4.6 4.8  CL 105 100  CO2 28 31  GLUCOSE 94 111*  BUN 47* 50*  CREATININE 2.24* 2.42*  CALCIUM 9.8 9.3   D-Dimer  Recent Labs  07/29/14 2224  DDIMER 1.27*   Thyroid Function Tests  Recent Labs  07/29/14 1853  TSH 1.984    TELE  NSR with LBBB. Occasional sinus brady to low 40s.   Radiology/Studies  Nm Pulmonary Perf And Vent  07/30/2014   CLINICAL DATA:  79 year old female with 2 day history of shortness of breath  EXAM: NUCLEAR MEDICINE VENTILATION - PERFUSION LUNG SCAN  TECHNIQUE: Ventilation images were obtained in multiple projections using inhaled aerosol technetium 99 M DTPA. Perfusion images were obtained in multiple projections after intravenous injection of Tc-44m MAA.  RADIOPHARMACEUTICALS:  40 mCi Tc-46m DTPA aerosol and 6 mCi Tc-75m MAA  COMPARISON:  Prior nuclear medicine V/Q scan 02/07/2014  FINDINGS: Ventilation: No focal ventilation defect.  Perfusion: No wedge shaped peripheral perfusion defects to suggest acute pulmonary embolism.  IMPRESSION: Negative for evidence of acute pulmonary embolus.  Signed,  Criselda Peaches, MD  [facility]  [facility]  Electronically Signed   By: Jacqulynn Cadet M.D.   On: 07/30/2014 15:42   Dg Chest Port 1 View  07/29/2014   CLINICAL DATA:  Labored breathing with any activity  EXAM: PORTABLE CHEST - 1 VIEW  COMPARISON:  04/18/2014  FINDINGS: Bilateral small pleural effusions with bilateral interstitial and alveolar airspace opacities and central pulmonary vascular prominence. No pneumothorax. Stable cardiomegaly. No acute osseous abnormality.  IMPRESSION: Findings consistent with CHF.   Electronically Signed   By: Kathreen Devoid   On: 07/29/2014 15:59   2D echo 07/31/2014 Study Conclusions  - Left ventricle: The cavity size was normal. There was moderate concentric hypertrophy. Systolic function was mildly reduced. The estimated ejection  fraction was in the range of 45% to 50%. Diffuse hypokinesis. Features are consistent with a pseudonormal left ventricular filling pattern, with concomitant abnormal relaxation and increased filling pressure (grade 2 diastolic dysfunction). Doppler parameters are consistent with elevated ventricular end-diastolic filling pressure. - Mitral valve: There was mild regurgitation. - Left atrium: The atrium was mildly dilated. - Right ventricle: The cavity size was normal. Wall thickness was normal. Systolic function was normal. - Right atrium: The atrium was normal in size. - Tricuspid valve: There was no regurgitation. - Pulmonary arteries: Systolic pressure was within the normal range. - Inferior vena cava: The vessel was normal in size. - Pericardium, extracardiac: A trivial pericardial effusion was identified posterior to the heart. Features were not consistent with tamponade physiology.  ASSESSMENT AND PLAN Principal Problem:   Diastolic CHF, acute on chronic Active Problems:   Acute respiratory failure with hypoxia   CKD (chronic kidney disease), stage IV   Type 2 diabetes mellitus with stage 4 chronic kidney disease   Essential hypertension   Obesity (BMI 37)   Bradycardia- beta blocker decreased   Sleep apnea   Acute on chronic combined systolic and diastolic CHF (congestive heart failure)   Doris Howell is a 79 y.o. female with a history of diastolic heart failure, DM type II, HTN, HLD, GERD who presented 4/18 with shortness of breath, orthopnea, and lower extremity edema. Her weight had increased by approximately 11 pounds.  Acute on chronic diastolic CHF -- 2D echo 123456 EF 45-50%. LV moderate concentric hypertrophy, diffuse hypokinesis, gade 2 diastolic dysfunction, mild Mitral reg, trival pericardial effusion was identified posterior to the heart. Echo 10/20415 had EF of 50-55 with mild LVH. -- Currently lasix to 80 mg IV twice a day. Net neg  4.4L. Weight down 11 lbs. ( 196--> 185 lbs. ). She feels back to baseline. Breathing improved. Home Lasix 40mg  AM and 20mg  PM. She had IV lasix 80mg  AM today. Will give 40mg  po PM tonight and then switch to home dose tomorrow.  -- DDimer elevated but VQ scan negative for pulmonary embolism -- Imdur 30mg  added to help relieve pulmonary edema. Hydralazine added yesterday  Hypertension  --Continue Norvasc 10mg  qd and carvedilol 12.5mg  BID  Chronic kidney disease stage IV , Creatinine at baseline of 2.2-2.3 -- Creat mildly elevated to 2.42, yesterday was 2.24. Continue to monitor in the setting of diuresis.  Obstructive sleep apnea stable -- Continue CPAP  Type 2 diabetes mellitus -- Per IM   Signed, Bhagat,Bhavinkumar PA-C Pager [phone]   Patient seen and examined. Agree with assessment and plan. Mild LV dysfunction with EF 45 -50% with diffuse hypokinesis and grade 2 diastolic dysfunction. I/O since admission -4737  WT 196 -->185. Cr increased to 2.42 today. Transitioning to oral lasix. Ambulating with assistance.  Troy Sine, MD, [facility] 08/01/2014 1:36 PM

## 2014-08-01 NOTE — Progress Notes (Signed)
Placed patient on CPAP for the night.  Patient is tolerating well at this time. 

## 2014-08-01 NOTE — Progress Notes (Signed)
[facility] PROGRESS NOTE  Doris Howell [id number] DOB: Feb 14, 1936 DOA: 07/28/2014 PCP: Irven Shelling, MD  Brief Summary   79 year old female with history of diastolic heart failure, type 2 diabetes mellitus, hypertension, hyperlipidemia, GERD who presented with shortness of breath, orthopnea, and lower extremity edema. Her weight had increased by approximately 11 pounds.  Cardiology was consulted and she has been started on IV diuresis.    Assessment/Plan  Acute hypoxic respiratory failure secondary to acute on chronic diastolic and systolic congestive heart failure -   Weight stable over last 24 hours -  -1.3L yesterday -   Lasix per cardiology -   Appreciate cardiology assistance -   VQ scan negative for pulmonary embolism -   imdur start for heart failure afterload reduction -  ECHO:  EF 45-50%  With diffuse hypokinesis,  Grade 2 diastolic dysfunction , elevated ventricular end-diastolic filling pressure, normal RV , trivial pericardial effusion without taper not the physiology -  On BB, no ACEI or ARB or spironolactone due to creatinine, but recommend adding hydralazine   Hypertension , blood pressure stable -   Continue Norvasc, carvedilol  Chronic kidney disease stage IV , Creatinine at baseline of 2.2-2.3 -   Minimize nephrotoxins and renally dose medications -   Trend creatinine will diuresed    Hypokalemia due to Lasix use resolved with oral supplementation   Obstructive sleep apnea, stable, continue CPAP  Type 2 diabetes mellitus with  Complication of chronic  kidney disease, CBG near goal -   Continue low-dose sliding scale insulin -   Continue to hold sulfonylurea  Diet:   Diabetic, low sodium Access:   PIV IVF:   off Proph:   heparin  Code Status:  full Family Communication:  Patient alone  Disposition Plan:  Possibly home tomorrow   Consultants:   cardiology, Dr. Harrington Challenger  Procedures:   chest x-ray   VQ scan  Antibiotics:    none   HPI/Subjective:  Patient states that she feels much better today.  No longer SOB at rest, swelling decreasing.  . She feels like her abdomen is distended    Objective: Filed Vitals:   07/31/14 1300 07/31/14 2140 08/01/14 0601 08/01/14 1450  BP: 129/57 137/81 127/56 139/66  Pulse: 68 72 69 56  Temp: 98.2 F (36.8 C) 98 F (36.7 C) 99.4 F (37.4 C) 97.9 F (36.6 C)  TempSrc: Oral Oral Oral Oral  Resp: 18 22 20 24   Height:      Weight:   84.233 kg (185 lb 11.2 oz)   SpO2: 97% 96% 98% 95%    Intake/Output Summary (Last 24 hours) at 08/01/14 1752 Last data filed at 08/01/14 1221  Gross per 24 hour  Intake   2320 ml  Output   2550 ml  Net   -230 ml   Filed Weights   07/30/14 0443 07/31/14 0546 08/01/14 0601  Weight: 87.136 kg (192 lb 1.6 oz) 84.369 kg (186 lb) 84.233 kg (185 lb 11.2 oz)    Exam:   General:   Overweight female, NAD  HEENT:  NCAT, MMM  Cardiovascular:  RRR, nl S1, S2 no mrg, 2+ pulses, warm extremities  Respiratory:   Better aeration, persistent rales at the bilateral bases, no increased WOB  Abdomen:   NABS, soft,  Mildly distended, nontender  MSK:   Normal tone and bulk, no LEE  Neuro:  Grossly intact  Data Reviewed: Basic Metabolic Panel:  Recent Labs Lab 07/28/14 1536 07/28/14 2055 07/29/14 0302 07/29/14  0935 07/30/14 0339 07/31/14 0542 08/01/14 0530  NA 141  --  145  --  144 145 141  K 3.4*  --  3.4*  --  4.2 4.6 4.8  CL 106  --  108  --  107 105 100  CO2 22  --  26  --  26 28 31   GLUCOSE 112*  --  124*  --  120* 94 111*  BUN 48*  --  45*  --  46* 47* 50*  CREATININE 2.27*  --  2.24*  --  2.25* 2.24* 2.42*  CALCIUM 9.8  --  9.2  --  9.5 9.8 9.3  MG  --  2.1  --  2.1  --   --   --    Liver Function Tests: No results for input(s): AST, ALT, ALKPHOS, BILITOT, PROT, ALBUMIN in the last 168 hours. No results for input(s): LIPASE, AMYLASE in the last 168 hours. No results for input(s): AMMONIA in the last 168  hours. CBC:  Recent Labs Lab 07/28/14 1536  WBC 5.5  NEUTROABS 3.9  HGB 10.0*  HCT 31.3*  MCV 88.2  PLT 244   Cardiac Enzymes:  Recent Labs Lab 07/28/14 2055 07/29/14 0302 07/29/14 0935  TROPONINI 0.07* 0.06* 0.07*   BNP (last 3 results)  Recent Labs  04/10/14 2350 04/15/14 0408 07/28/14 1536  BNP 909.9* 591.0* 801.5*    ProBNP (last 3 results)  Recent Labs  01/09/14 1515 02/05/14 1216  PROBNP 2595.0* 2767.0*    CBG:  Recent Labs Lab 07/31/14 1638 07/31/14 2129 08/01/14 0600 08/01/14 1125 08/01/14 1630  GLUCAP 105* 167* 113* 190* 143*    No results found for this or any previous visit (from the past 240 hour(s)).   Studies: No results found.  Scheduled Meds: . allopurinol  100 mg Oral BID  . amLODipine  10 mg Oral q morning - 10a  . aspirin EC  81 mg Oral Daily  . carvedilol  12.5 mg Oral BID WC  . cholecalciferol  2,000 Units Oral Daily  . docusate sodium  100 mg Oral BID  . [START ON 08/02/2014] furosemide  20 mg Oral Daily  . furosemide  40 mg Oral Daily  . heparin  5,000 Units Subcutaneous 3 times per day  . hydrALAZINE  10 mg Oral TID  . insulin aspart  0-5 Units Subcutaneous QHS  . insulin aspart  0-9 Units Subcutaneous TID WC  . isosorbide mononitrate  30 mg Oral Daily  . latanoprost  1 drop Both Eyes QHS  . potassium chloride  40 mEq Oral BID  . sodium chloride  3 mL Intravenous Q12H   Continuous Infusions:   Principal Problem:   Diastolic CHF, acute on chronic Active Problems:   Acute respiratory failure with hypoxia   CKD (chronic kidney disease), stage IV   Type 2 diabetes mellitus with stage 4 chronic kidney disease   Essential hypertension   Obesity (BMI 37)   Bradycardia- beta blocker decreased   Sleep apnea   Acute on chronic combined systolic and diastolic CHF (congestive heart failure)    Time spent: 30 min    Shayana Hornstein, [facility] Pager [phone]. If 7PM-7AM, please contact night-coverage  at [email or web address], password [email or web address] 08/01/2014, 5:52 PM  LOS: 4 days

## 2014-08-02 LAB — BASIC METABOLIC PANEL
Anion gap: 12 (ref 5–15)
BUN: 56 mg/dL — ABNORMAL HIGH (ref 6–23)
CO2: 30 mmol/L (ref 19–32)
Calcium: 9.5 mg/dL (ref 8.4–10.5)
Chloride: 99 mmol/L (ref 96–112)
Creatinine, Ser: 2.63 mg/dL — ABNORMAL HIGH (ref 0.50–1.10)
GFR calc Af Amer: 19 mL/min — ABNORMAL LOW (ref >90)
GFR calc non Af Amer: 16 mL/min — ABNORMAL LOW (ref >90)
Glucose, Bld: 95 mg/dL (ref 70–99)
Potassium: 5.1 mmol/L (ref 3.5–5.1)
Sodium: 141 mmol/L (ref 135–145)

## 2014-08-02 LAB — GLUCOSE, CAPILLARY: Glucose-Capillary: 106 mg/dL — ABNORMAL HIGH (ref 70–99)

## 2014-08-02 MED ORDER — HYDRALAZINE HCL 10 MG PO TABS: 10.0000 mg | ORAL_TABLET | Freq: Three times a day (TID) | ORAL | Status: DC

## 2014-08-02 MED ORDER — ISOSORBIDE MONONITRATE ER 30 MG PO TB24: 30.0000 mg | ORAL_TABLET | Freq: Every day | ORAL | Status: DC

## 2014-08-02 NOTE — Discharge Summary (Signed)
Physician Discharge Summary  ARLETA APOLLO [id number] DOB: [birth year] DOA: 07/28/2014  PCP: Irven Shelling, MD  Admit date: 07/28/2014 Discharge date: 08/02/2014  Recommendations for Outpatient Follow-up:  -  HH PT and RN -  Started hydralazine and Imdur -  Lasix dose was left the same -  She should follow-up with cardiology in approximately 1 week, please repeat BMP to check potassium and creatinine  Discharge Diagnoses:  Principal Problem:   Diastolic CHF, acute on chronic Active Problems:   Acute respiratory failure with hypoxia   CKD (chronic kidney disease), stage IV   Type 2 diabetes mellitus with stage 4 chronic kidney disease   Essential hypertension   Obesity (BMI 37)   Bradycardia- beta blocker decreased   Sleep apnea   Acute on chronic combined systolic and diastolic CHF (congestive heart failure)   Discharge Condition: Stable, improved  Diet recommendation: Diabetic, low sodium  Wt Readings from Last 3 Encounters:  08/02/14 83.598 kg (184 lb 4.8 oz)  07/17/14 87.998 kg (194 lb)  05/05/14 85.73 kg (189 lb)    History of present illness:   79 year old female with history of diastolic heart failure, type 2 diabetes mellitus, hypertension, hyperlipidemia, GERD who presented with shortness of breath, orthopnea, and lower extremity edema. Her weight had increased by approximately 11 pounds. Cardiology was consulted and she has been started on IV diuresis.     Hospital Course:   Acute hypoxic respiratory failure secondary to acute on chronic diastolic and systolic congestive heart failure.  She was seen by cardiology and started on lasix 80mg  IV BID.  Her weight decreased from 89 kg to today 83.5 kg.  She was -4.6 L overall.  She was started on hydralazine and Imdur and her Lasix dose was left the same at the time of discharge.  She is not on spironolactone, ACE inhibitor or ARB secondary to chronic kidney disease.  She continued beta blocker.  She will  need close follow-up with cardiology. Encouraged her to continue to eat a low-sodium diet.  She had education by nursing staff and nutrition regarding heart failure management.   - VQ scan negative for pulmonary embolism - ECHO DEMONSTRATED EF 45-50% With diffuse hypokinesis, Grade 2 diastolic dysfunction , elevated ventricular end-diastolic filling pressure, normal RV , trivial pericardial effusion without taper not the physiology -  Able to ambulate with stable oxygen levels on room air prior to discharge  Hypertension , blood pressure stable - Continued Norvasc, carvedilol and added hydralazine  Chronic kidney disease stage IV , Creatinine baseline of 2.2-2.3.  Her creatinine rose slightly on 4/23 likely secondary to a little azotemia. Repeat creatinine in one week by cardiology.  Hypokalemia due to Lasix use resolved with oral supplementation.  Obstructive sleep apnea, stable, continued CPAP  Type 2 diabetes mellitus withcomplication of chronic kidney disease, CBG near goal - Continued low-dose sliding scale insulin - Resumed sulfonylurea at discharge  Consultants:  cardiology, Dr. Harrington Challenger  Procedures:  chest x-ray   VQ scan  Antibiotics:  none  Discharge Exam: Filed Vitals:   08/02/14 0900  BP: 122/55  Pulse: 69  Temp: 98.2 F (36.8 C)  Resp: 18   Filed Vitals:   08/01/14 2055 08/01/14 2201 08/02/14 0500 08/02/14 0900  BP: 126/52  126/49 122/55  Pulse: 66 63 70 69  Temp: 98.1 F (36.7 C)  98 F (36.7 C) 98.2 F (36.8 C)  TempSrc: Oral  Oral Oral  Resp: 22 18 18 18   Height:  Weight:   83.598 kg (184 lb 4.8 oz)   SpO2: 97% 97% 97% 96%     General: Overweight female, NAD  HEENT: NCAT, MMM  Cardiovascular: RRR, nl S1, S2 no mrg, 2+ pulses, warm extremities  Respiratory: Better aeration, persistent course rales at the bilateral bases, no increased WOB  Abdomen: NABS, soft, Mildly distended, nontender  MSK: Normal  tone and bulk, no LEE  Neuro: Grossly intact  Discharge Instructions      Discharge Instructions    (HEART FAILURE PATIENTS) Call MD:  Anytime you have any of the following symptoms: 1) 3 pound weight gain in 24 hours or 5 pounds in 1 week 2) shortness of breath, with or without a dry hacking cough 3) swelling in the hands, feet or stomach 4) if you have to sleep on extra pillows at night in order to breathe.    Complete by:  As directed      Call MD for:  difficulty breathing, headache or visual disturbances    Complete by:  As directed      Call MD for:  extreme fatigue    Complete by:  As directed      Call MD for:  hives    Complete by:  As directed      Call MD for:  persistant dizziness or light-headedness    Complete by:  As directed      Call MD for:  persistant nausea and vomiting    Complete by:  As directed      Call MD for:  severe uncontrolled pain    Complete by:  As directed      Call MD for:  temperature >100.4    Complete by:  As directed      Diet - low sodium heart healthy    Complete by:  As directed      Increase activity slowly    Complete by:  As directed             Medication List    TAKE these medications        albuterol 108 (90 BASE) MCG/ACT inhaler  Commonly known as:  PROVENTIL HFA;VENTOLIN HFA  Inhale 2 puffs into the lungs every 6 (six) hours as needed for wheezing or shortness of breath.     allopurinol 100 MG tablet  Commonly known as:  ZYLOPRIM  Take 100 mg by mouth 2 (two) times daily.     amLODipine 10 MG tablet  Commonly known as:  NORVASC  Take 10 mg by mouth every morning.     aspirin EC 81 MG tablet  Take 81 mg by mouth daily.     bimatoprost 0.01 % Soln  Commonly known as:  LUMIGAN  Place 1 drop into both eyes at bedtime.     carvedilol 12.5 MG tablet  Commonly known as:  COREG  Take 1 tablet (12.5 mg total) by mouth 2 (two) times daily with a meal.     cholecalciferol 1000 UNITS tablet  Commonly known as:   VITAMIN D  Take 2,000 Units by mouth daily.     furosemide 40 MG tablet  Commonly known as:  LASIX  Take 20-40 mg by mouth 2 (two) times daily. Patient taking one (1) tablet in am and half (1/2) tablet in pm     glimepiride 1 MG tablet  Commonly known as:  AMARYL  Take 1 mg by mouth daily.     hydrALAZINE 10 MG tablet  Commonly known  as:  APRESOLINE  Take 1 tablet (10 mg total) by mouth 3 (three) times daily.     isosorbide mononitrate 30 MG 24 hr tablet  Commonly known as:  IMDUR  Take 1 tablet (30 mg total) by mouth daily.     ondansetron 4 MG disintegrating tablet  Commonly known as:  ZOFRAN ODT  Take 1 tablet (4 mg total) by mouth every 8 (eight) hours as needed for nausea or vomiting.       Follow-up Information    Follow up with Irven Shelling, MD. Schedule an appointment as soon as possible for a visit in 2 weeks.   Specialty:  Internal Medicine   Contact information:   [address] [place] [address] [phone]       Follow up with Darlin Coco, MD. Schedule an appointment as soon as possible for a visit in 1 week.   Specialty:  Cardiology   Contact information:   [address] [place] [address] [phone]        The results of significant diagnostics from this hospitalization (including imaging, microbiology, ancillary and laboratory) are listed below for reference.    Significant Diagnostic Studies: Nm Pulmonary Perf And Vent  07/30/2014   CLINICAL DATA:  79 year old female with 2 day history of shortness of breath  EXAM: NUCLEAR MEDICINE VENTILATION - PERFUSION LUNG SCAN  TECHNIQUE: Ventilation images were obtained in multiple projections using inhaled aerosol technetium 99 M DTPA. Perfusion images were obtained in multiple projections after intravenous injection of Tc-6m MAA.  RADIOPHARMACEUTICALS:  40 mCi Tc-8m DTPA aerosol and 6 mCi Tc-66m MAA  COMPARISON:  Prior nuclear medicine V/Q scan 02/07/2014   FINDINGS: Ventilation: No focal ventilation defect.  Perfusion: No wedge shaped peripheral perfusion defects to suggest acute pulmonary embolism.  IMPRESSION: Negative for evidence of acute pulmonary embolus.  Signed,  Criselda Peaches, MD  [facility]  [facility]   Electronically Signed   By: Jacqulynn Cadet M.D.   On: 07/30/2014 15:42   Dg Chest Port 1 View  07/29/2014   CLINICAL DATA:  Labored breathing with any activity  EXAM: PORTABLE CHEST - 1 VIEW  COMPARISON:  04/18/2014  FINDINGS: Bilateral small pleural effusions with bilateral interstitial and alveolar airspace opacities and central pulmonary vascular prominence. No pneumothorax. Stable cardiomegaly. No acute osseous abnormality.  IMPRESSION: Findings consistent with CHF.   Electronically Signed   By: Kathreen Devoid   On: 07/29/2014 15:59    Microbiology: No results found for this or any previous visit (from the past 240 hour(s)).   Labs: Basic Metabolic Panel:  Recent Labs Lab 07/28/14 2055 07/29/14 0302 07/29/14 0935 07/30/14 0339 07/31/14 0542 08/01/14 0530 08/02/14 0501  NA  --  145  --  144 145 141 141  K  --  3.4*  --  4.2 4.6 4.8 5.1  CL  --  108  --  107 105 100 99  CO2  --  26  --  26 28 31 30   GLUCOSE  --  124*  --  120* 94 111* 95  BUN  --  45*  --  46* 47* 50* 56*  CREATININE  --  2.24*  --  2.25* 2.24* 2.42* 2.63*  CALCIUM  --  9.2  --  9.5 9.8 9.3 9.5  MG 2.1  --  2.1  --   --   --   --    Liver Function Tests: No  results for input(s): AST, ALT, ALKPHOS, BILITOT, PROT, ALBUMIN in the last 168 hours. No results for input(s): LIPASE, AMYLASE in the last 168 hours. No results for input(s): AMMONIA in the last 168 hours. CBC:  Recent Labs Lab 07/28/14 1536  WBC 5.5  NEUTROABS 3.9  HGB 10.0*  HCT 31.3*  MCV 88.2  PLT 244   Cardiac Enzymes:  Recent Labs Lab 07/28/14 2055 07/29/14 0302 07/29/14 0935  TROPONINI 0.07* 0.06* 0.07*   BNP: BNP  (last 3 results)  Recent Labs  04/10/14 2350 04/15/14 0408 07/28/14 1536  BNP 909.9* 591.0* 801.5*    ProBNP (last 3 results)  Recent Labs  01/09/14 1515 02/05/14 1216  PROBNP 2595.0* 2767.0*    CBG:  Recent Labs Lab 08/01/14 0600 08/01/14 1125 08/01/14 1630 08/01/14 2132 08/02/14 0755  GLUCAP 113* 190* 143* 156* 106*    Time coordinating discharge: 35 minutes  Signed:  Aleksandar Duve  [facility] 08/02/2014, 11:10 AM

## 2014-08-02 NOTE — Progress Notes (Signed)
Patient ambulated in hall on RA O2 sat 96% .

## 2014-08-12 ENCOUNTER — Encounter: Payer: Self-pay | Admitting: Cardiology

## 2014-08-12 ENCOUNTER — Other Ambulatory Visit (HOSPITAL_COMMUNITY): Payer: Self-pay | Admitting: Otolaryngology

## 2014-08-12 ENCOUNTER — Ambulatory Visit (INDEPENDENT_AMBULATORY_CARE_PROVIDER_SITE_OTHER): Payer: Medicare Other | Admitting: Cardiology

## 2014-08-12 VITALS — BP 104/46 | HR 62 | Ht 61.0 in | Wt 187.6 lb

## 2014-08-12 DIAGNOSIS — R0609 Other forms of dyspnea: Secondary | ICD-10-CM | POA: Diagnosis not present

## 2014-08-12 DIAGNOSIS — R0600 Dyspnea, unspecified: Secondary | ICD-10-CM

## 2014-08-12 DIAGNOSIS — I5032 Chronic diastolic (congestive) heart failure: Secondary | ICD-10-CM | POA: Diagnosis not present

## 2014-08-12 DIAGNOSIS — N183 Chronic kidney disease, stage 3 unspecified: Secondary | ICD-10-CM

## 2014-08-12 DIAGNOSIS — I509 Heart failure, unspecified: Secondary | ICD-10-CM | POA: Diagnosis not present

## 2014-08-12 DIAGNOSIS — I5082 Biventricular heart failure: Secondary | ICD-10-CM

## 2014-08-12 NOTE — Progress Notes (Signed)
Cardiology Office Note   Date:  08/12/2014   ID:  Doris Howell, DOB [birth year], MRN [id number]  PCP:  Irven Shelling, MD  Cardiologist: Darlin Coco MD  No chief complaint on file.     History of Present Illness: Doris Howell is a 79 y.o. female who presents for a post hospital office visit  Doris Howell is a 79 y.o. female who presents for follow-up after recent hospitalization in April 2016 for exacerbation of chronic diastolic heart failure. During an earlier admission in January 2016 the patient  had a right heart catheterization on 04/15/14 and she was found to have a pulmonary artery pressure of 64/25 with a mean of 40. The right ventricular end-diastolic pressure was 15. The impression was biventricular heart failure with preserved left ventricular ejection fraction and moderate pulmonary artery hypertension. The patient responded well to diuresis. Since being discharged her breathing has improved. Her weight has been stable at home. She sleeps on 2 pillows and the head of her bed is elevated on bricks. She is not having any paroxysmal nocturnal dyspnea. The patient has severe kidney disease and left heart catheterization was avoided because of her renal insufficiency.  The patient had an echocardiogram on 07/31/14 which showed an ejection fraction of 45-50% with grade 2 diastolic dysfunction and mild mitral regurgitation. The patient has a history of diabetes.  She denies any hypoglycemic episodes. Since last visit she has been doing reasonably well. She had a Lexiscan Myoview on 05/05/14 which showed left ventricular systolic dysfunction with ejection fraction 44%. There was no ischemia. The patient has not been having any chest pain. She has a history of sleep apnea. The patient has not been having any symptoms of gout. She is not having any hypoglycemic episodes. She has been experiencing nocturia 2-3 times a night.  Past Medical History    Diagnosis Date  . Hypertension   . High cholesterol   . Kidney stones   . Type II diabetes mellitus with stage 4 chronic kidney disease   . GERD (gastroesophageal reflux disease)   . History of gout   . CAP (community acquired pneumonia) 01/09/2014    "first time I've ever had it"  . Diastolic congestive heart failure     Past Surgical History  Procedure Laterality Date  . Kidney stone surgery  10-05-12    "cut me on the side"  . Colonoscopy with propofol N/A 10/23/2012    Procedure: COLONOSCOPY WITH PROPOFOL;  Surgeon: [name], MD;  Location: [facility];  Service: Endoscopy;  Laterality: N/A;  . Abdominal hysterectomy  ~ 1974  . Lithotripsy  1980's?  . Cataract extraction w/ intraocular lens  implant, bilateral Bilateral 2014  . Right heart catheterization N/A 04/15/2014    Procedure: RIGHT HEART CATH;  Surgeon: Sanda Klein, MD;  Location: [facility] CATH LAB;  Service: Cardiovascular;  Laterality: N/A;     Current Outpatient Prescriptions  Medication Sig Dispense Refill  . albuterol (PROVENTIL HFA;VENTOLIN HFA) 108 (90 BASE) MCG/ACT inhaler Inhale 2 puffs into the lungs every 6 (six) hours as needed for wheezing or shortness of breath. 1 Inhaler 0  . allopurinol (ZYLOPRIM) 100 MG tablet Take 100 mg by mouth 2 (two) times daily.    [name] amLODipine (NORVASC) 10 MG tablet Take 10 mg by mouth every morning.     [name] aspirin EC 81 MG tablet Take 81 mg by mouth daily.    . bimatoprost (LUMIGAN) 0.01 % SOLN Place 1 drop  into both eyes at bedtime.     . carvedilol (COREG) 6.25 MG tablet Take 1 tablet by mouth daily.    . cholecalciferol (VITAMIN D) 1000 UNITS tablet Take 2,000 Units by mouth daily.     . Ferrous Sulfate Dried 200 (65 FE) MG TABS Take 1 tablet by mouth daily.    . furosemide (LASIX) 40 MG tablet Take 20-40 mg by mouth 2 (two) times daily. Patient taking one (1) tablet in am and half (1/2) tablet in pm 60 tablet 1  . glimepiride (AMARYL) 1 MG tablet Take 1 mg by mouth  daily.    . hydrALAZINE (APRESOLINE) 10 MG tablet Take 10 mg by mouth 2 (two) times daily.    . isosorbide mononitrate (IMDUR) 30 MG 24 hr tablet Take 1 tablet (30 mg total) by mouth daily. 30 tablet 0  . ondansetron (ZOFRAN ODT) 4 MG disintegrating tablet Take 1 tablet (4 mg total) by mouth every 8 (eight) hours as needed for nausea or vomiting. 12 tablet 0   No current facility-administered medications for this visit.    Allergies:   Simvastatin and Codeine    Social History:  The patient  reports that she has never smoked. She has never used smokeless tobacco. She reports that she does not drink alcohol or use illicit drugs.   Family History:  The patient's family history includes Hypertension in her mother. There is no history of CAD.    ROS:  Please see the history of present illness.   Otherwise, review of systems are positive for none.   All other systems are reviewed and negative.    PHYSICAL EXAM: VS:  BP 104/46 mmHg  Pulse 62  Ht 5\' 1"  (1.549 m)  Wt 187 lb 9.6 oz (85.095 kg)  BMI 35.47 kg/m2 , BMI Body mass index is 35.47 kg/(m^2). GEN: Well nourished, well developed, in no acute distress HEENT: normal Neck: no JVD, carotid bruits, or masses Cardiac: RRR; no murmurs, rubs, or gallops,no edema  Respiratory:  clear to auscultation bilaterally, normal work of breathing GI: soft, nontender, nondistended, + BS MS: no deformity or atrophy Skin: warm and dry, no rash Neuro:  Strength and sensation are intact Psych: euthymic mood, full affect   EKG:  EKG is not ordered today.    Recent Labs: 02/05/2014: Pro B Natriuretic peptide (BNP) 2767.0* 04/11/2014: ALT 18 07/28/2014: B Natriuretic Peptide 801.5*; Hemoglobin 10.0*; Platelets 244 07/29/2014: Magnesium 2.1; TSH 1.984 08/02/2014: BUN 56*; Creatinine 2.63*; Potassium 5.1; Sodium 141    Lipid Panel No results found for: CHOL, TRIG, HDL, CHOLHDL, VLDL, LDLCALC, LDLDIRECT    Wt Readings from Last 3 Encounters:   08/12/14 187 lb 9.6 oz (85.095 kg)  08/02/14 184 lb 4.8 oz (83.598 kg)  07/17/14 194 lb (87.998 kg)        ASSESSMENT AND PLAN:  1.  Biventricular congestive heart failure, improved. Ejection fraction 50% by echo. Ejection fraction 44% by Lexiscan Myoview on 05/05/14. No reversible ischemia 2. Renal insufficiency stage 3 3. Anemia of chronic disease  Current medicines are reviewed at length with the patient today. The patient does not have concerns regarding medicines.     Current medicines are reviewed at length with the patient today.  The patient does not have concerns regarding medicines.  The following changes have been made:  The patient feels that the hydralazine is causing her to have spasms in her right upper quadrant.  We talked about stopping the hydralazine or reducing the dose.  She would prefer to reduce the dose.  We will reduce the dose to just 10 mg twice a day.  If her symptoms persist, we will consider stopping the hydralazine altogether.  Her data is on the low side of normal and she may not need the hydralazine any longer.  Labs/ tests ordered today include:  No orders of the defined types were placed in this encounter.    Recheck in 4 months for office visit and EKG  Signed, Darlin Coco MD 08/12/2014 1:18 PM    [facility] [address], [place], [place]  [address] Phone: [phone]; Fax: [phone]

## 2014-08-12 NOTE — Patient Instructions (Signed)
Medication Instructions:  DECREASE YOUR HYDRALAZINE TO 10 MG TWICE A DAY  Labwork: NONE  Testing/Procedures: NONE  Follow-Up: Your physician recommends that you schedule a follow-up appointment in: 4 MONTH OV/EKG   Any Other Special Instructions Will Be Listed Below (If Applicable).

## 2014-08-28 ENCOUNTER — Inpatient Hospital Stay (HOSPITAL_COMMUNITY)
Admission: EM | Admit: 2014-08-28 | Discharge: 2014-09-11 | DRG: 291 | Disposition: A | Discharge status: Home Health | Payer: Medicare Other | Attending: Internal Medicine | Admitting: Internal Medicine

## 2014-08-28 ENCOUNTER — Other Ambulatory Visit (HOSPITAL_COMMUNITY): Payer: Self-pay

## 2014-08-28 ENCOUNTER — Emergency Department (HOSPITAL_COMMUNITY): Payer: Medicare Other

## 2014-08-28 ENCOUNTER — Encounter (HOSPITAL_COMMUNITY): Payer: Self-pay | Admitting: Emergency Medicine

## 2014-08-28 DIAGNOSIS — Z79899 Other long term (current) drug therapy: Secondary | ICD-10-CM | POA: Diagnosis not present

## 2014-08-28 DIAGNOSIS — R918 Other nonspecific abnormal finding of lung field: Secondary | ICD-10-CM | POA: Insufficient documentation

## 2014-08-28 DIAGNOSIS — R0602 Shortness of breath: Secondary | ICD-10-CM | POA: Diagnosis present

## 2014-08-28 DIAGNOSIS — M109 Gout, unspecified: Secondary | ICD-10-CM | POA: Diagnosis present

## 2014-08-28 DIAGNOSIS — J441 Chronic obstructive pulmonary disease with (acute) exacerbation: Secondary | ICD-10-CM | POA: Diagnosis not present

## 2014-08-28 DIAGNOSIS — E662 Morbid (severe) obesity with alveolar hypoventilation: Secondary | ICD-10-CM | POA: Diagnosis present

## 2014-08-28 DIAGNOSIS — I509 Heart failure, unspecified: Secondary | ICD-10-CM | POA: Diagnosis not present

## 2014-08-28 DIAGNOSIS — Z7951 Long term (current) use of inhaled steroids: Secondary | ICD-10-CM | POA: Diagnosis not present

## 2014-08-28 DIAGNOSIS — I5031 Acute diastolic (congestive) heart failure: Secondary | ICD-10-CM | POA: Insufficient documentation

## 2014-08-28 DIAGNOSIS — N184 Chronic kidney disease, stage 4 (severe): Secondary | ICD-10-CM | POA: Diagnosis present

## 2014-08-28 DIAGNOSIS — J9621 Acute and chronic respiratory failure with hypoxia: Secondary | ICD-10-CM | POA: Diagnosis not present

## 2014-08-28 DIAGNOSIS — R001 Bradycardia, unspecified: Secondary | ICD-10-CM | POA: Diagnosis present

## 2014-08-28 DIAGNOSIS — E1122 Type 2 diabetes mellitus with diabetic chronic kidney disease: Secondary | ICD-10-CM | POA: Diagnosis present

## 2014-08-28 DIAGNOSIS — Z6835 Body mass index (BMI) 35.0-35.9, adult: Secondary | ICD-10-CM

## 2014-08-28 DIAGNOSIS — J948 Other specified pleural conditions: Secondary | ICD-10-CM | POA: Diagnosis not present

## 2014-08-28 DIAGNOSIS — R140 Abdominal distension (gaseous): Secondary | ICD-10-CM | POA: Diagnosis not present

## 2014-08-28 DIAGNOSIS — D638 Anemia in other chronic diseases classified elsewhere: Secondary | ICD-10-CM | POA: Diagnosis present

## 2014-08-28 DIAGNOSIS — Y95 Nosocomial condition: Secondary | ICD-10-CM | POA: Diagnosis not present

## 2014-08-28 DIAGNOSIS — I272 Other secondary pulmonary hypertension: Secondary | ICD-10-CM | POA: Diagnosis present

## 2014-08-28 DIAGNOSIS — I10 Essential (primary) hypertension: Secondary | ICD-10-CM

## 2014-08-28 DIAGNOSIS — Z9119 Patient's noncompliance with other medical treatment and regimen: Secondary | ICD-10-CM | POA: Diagnosis present

## 2014-08-28 DIAGNOSIS — J90 Pleural effusion, not elsewhere classified: Secondary | ICD-10-CM

## 2014-08-28 DIAGNOSIS — Z7722 Contact with and (suspected) exposure to environmental tobacco smoke (acute) (chronic): Secondary | ICD-10-CM | POA: Diagnosis present

## 2014-08-28 DIAGNOSIS — E780 Pure hypercholesterolemia: Secondary | ICD-10-CM | POA: Diagnosis present

## 2014-08-28 DIAGNOSIS — I5043 Acute on chronic combined systolic (congestive) and diastolic (congestive) heart failure: Principal | ICD-10-CM

## 2014-08-28 DIAGNOSIS — G4730 Sleep apnea, unspecified: Secondary | ICD-10-CM

## 2014-08-28 DIAGNOSIS — J9600 Acute respiratory failure, unspecified whether with hypoxia or hypercapnia: Secondary | ICD-10-CM | POA: Diagnosis not present

## 2014-08-28 DIAGNOSIS — I129 Hypertensive chronic kidney disease with stage 1 through stage 4 chronic kidney disease, or unspecified chronic kidney disease: Secondary | ICD-10-CM | POA: Diagnosis present

## 2014-08-28 DIAGNOSIS — F419 Anxiety disorder, unspecified: Secondary | ICD-10-CM | POA: Diagnosis present

## 2014-08-28 DIAGNOSIS — Z833 Family history of diabetes mellitus: Secondary | ICD-10-CM | POA: Diagnosis not present

## 2014-08-28 DIAGNOSIS — I454 Nonspecific intraventricular block: Secondary | ICD-10-CM | POA: Diagnosis present

## 2014-08-28 DIAGNOSIS — J189 Pneumonia, unspecified organism: Secondary | ICD-10-CM | POA: Diagnosis not present

## 2014-08-28 DIAGNOSIS — D649 Anemia, unspecified: Secondary | ICD-10-CM | POA: Diagnosis present

## 2014-08-28 DIAGNOSIS — J9601 Acute respiratory failure with hypoxia: Secondary | ICD-10-CM | POA: Diagnosis not present

## 2014-08-28 DIAGNOSIS — N179 Acute kidney failure, unspecified: Secondary | ICD-10-CM | POA: Diagnosis not present

## 2014-08-28 DIAGNOSIS — J9622 Acute and chronic respiratory failure with hypercapnia: Secondary | ICD-10-CM | POA: Diagnosis not present

## 2014-08-28 DIAGNOSIS — N189 Chronic kidney disease, unspecified: Secondary | ICD-10-CM | POA: Diagnosis not present

## 2014-08-28 DIAGNOSIS — I340 Nonrheumatic mitral (valve) insufficiency: Secondary | ICD-10-CM | POA: Diagnosis present

## 2014-08-28 DIAGNOSIS — K219 Gastro-esophageal reflux disease without esophagitis: Secondary | ICD-10-CM | POA: Diagnosis present

## 2014-08-28 DIAGNOSIS — R0902 Hypoxemia: Secondary | ICD-10-CM

## 2014-08-28 DIAGNOSIS — Z7982 Long term (current) use of aspirin: Secondary | ICD-10-CM

## 2014-08-28 DIAGNOSIS — Z8249 Family history of ischemic heart disease and other diseases of the circulatory system: Secondary | ICD-10-CM

## 2014-08-28 LAB — CBC WITH DIFFERENTIAL/PLATELET
Basophils Absolute: 0 10*3/uL (ref 0.0–0.1)
Basophils Relative: 0 % (ref 0–1)
Eosinophils Absolute: 0.2 10*3/uL (ref 0.0–0.7)
Eosinophils Relative: 3 % (ref 0–5)
HCT: 28 % — ABNORMAL LOW (ref 36.0–46.0)
Hemoglobin: 8.9 g/dL — ABNORMAL LOW (ref 12.0–15.0)
Lymphocytes Relative: 11 % — ABNORMAL LOW (ref 12–46)
Lymphs Abs: 0.7 10*3/uL (ref 0.7–4.0)
MCH: 28.1 pg (ref 26.0–34.0)
MCHC: 31.8 g/dL (ref 30.0–36.0)
MCV: 88.3 fL (ref 78.0–100.0)
Monocytes Absolute: 0.7 10*3/uL (ref 0.1–1.0)
Monocytes Relative: 12 % (ref 3–12)
Neutro Abs: 4.7 10*3/uL (ref 1.7–7.7)
Neutrophils Relative %: 74 % (ref 43–77)
Platelets: 279 10*3/uL (ref 150–400)
RBC: 3.17 MIL/uL — ABNORMAL LOW (ref 3.87–5.11)
RDW: 15.4 % (ref 11.5–15.5)
WBC: 6.4 10*3/uL (ref 4.0–10.5)

## 2014-08-28 LAB — URINALYSIS, ROUTINE W REFLEX MICROSCOPIC
Bilirubin Urine: NEGATIVE
Glucose, UA: NEGATIVE mg/dL
Hgb urine dipstick: NEGATIVE
Ketones, ur: NEGATIVE mg/dL
Nitrite: NEGATIVE
Protein, ur: NEGATIVE mg/dL
Specific Gravity, Urine: 1.01 (ref 1.005–1.030)
Urobilinogen, UA: 0.2 mg/dL (ref 0.0–1.0)
pH: 5 (ref 5.0–8.0)

## 2014-08-28 LAB — I-STAT TROPONIN, ED: Troponin i, poc: 0.02 ng/mL (ref 0.00–0.08)

## 2014-08-28 LAB — COMPREHENSIVE METABOLIC PANEL
ALT: 23 U/L (ref 14–54)
AST: 31 U/L (ref 15–41)
Albumin: 3.4 g/dL — ABNORMAL LOW (ref 3.5–5.0)
Alkaline Phosphatase: 101 U/L (ref 38–126)
Anion gap: 12 (ref 5–15)
BUN: 39 mg/dL — ABNORMAL HIGH (ref 6–20)
CO2: 25 mmol/L (ref 22–32)
Calcium: 9.5 mg/dL (ref 8.9–10.3)
Chloride: 105 mmol/L (ref 101–111)
Creatinine, Ser: 2.13 mg/dL — ABNORMAL HIGH (ref 0.44–1.00)
GFR calc Af Amer: 25 mL/min — ABNORMAL LOW (ref >60)
GFR calc non Af Amer: 21 mL/min — ABNORMAL LOW (ref >60)
Glucose, Bld: 92 mg/dL (ref 65–99)
Potassium: 3.6 mmol/L (ref 3.5–5.1)
Sodium: 142 mmol/L (ref 135–145)
Total Bilirubin: 0.6 mg/dL (ref 0.3–1.2)
Total Protein: 7 g/dL (ref 6.5–8.1)

## 2014-08-28 LAB — I-STAT CHEM 8, ED
BUN: 41 mg/dL — ABNORMAL HIGH (ref 6–20)
Calcium, Ion: 1.18 mmol/L (ref 1.13–1.30)
Chloride: 103 mmol/L (ref 101–111)
Creatinine, Ser: 2.1 mg/dL — ABNORMAL HIGH (ref 0.44–1.00)
Glucose, Bld: 93 mg/dL (ref 65–99)
HCT: 33 % — ABNORMAL LOW (ref 36.0–46.0)
Hemoglobin: 11.2 g/dL — ABNORMAL LOW (ref 12.0–15.0)
Potassium: 3.6 mmol/L (ref 3.5–5.1)
Sodium: 142 mmol/L (ref 135–145)
TCO2: 25 mmol/L (ref 0–100)

## 2014-08-28 LAB — URINE MICROSCOPIC-ADD ON

## 2014-08-28 LAB — GLUCOSE, CAPILLARY: Glucose-Capillary: 206 mg/dL — ABNORMAL HIGH (ref 65–99)

## 2014-08-28 LAB — CBG MONITORING, ED: Glucose-Capillary: 123 mg/dL — ABNORMAL HIGH (ref 65–99)

## 2014-08-28 LAB — TROPONIN I: Troponin I: 0.03 ng/mL (ref <0.031)

## 2014-08-28 LAB — BRAIN NATRIURETIC PEPTIDE: B Natriuretic Peptide: 646.5 pg/mL — ABNORMAL HIGH (ref 0.0–100.0)

## 2014-08-28 MED ORDER — ISOSORBIDE MONONITRATE ER 30 MG PO TB24: 30.0000 mg | ORAL_TABLET | Freq: Every day | ORAL | Status: DC

## 2014-08-28 MED ORDER — LATANOPROST 0.005 % OP SOLN
1.0000 [drp] | Freq: Every day | OPHTHALMIC | Status: DC
  Administered 2014-08-28 – 2014-09-10 (×14): 1 [drp] via OPHTHALMIC
  Filled 2014-08-28 (×3): qty 2.5

## 2014-08-28 MED ORDER — LORAZEPAM 1 MG PO TABS
1.0000 mg | ORAL_TABLET | Freq: Once | ORAL | Status: AC
  Administered 2014-08-28: 1 mg via ORAL
  Filled 2014-08-28: qty 1

## 2014-08-28 MED ORDER — INSULIN ASPART 100 UNIT/ML ~~LOC~~ SOLN
0.0000 [IU] | Freq: Three times a day (TID) | SUBCUTANEOUS | Status: DC
  Administered 2014-08-29: 1 [IU] via SUBCUTANEOUS
  Administered 2014-08-29: 2 [IU] via SUBCUTANEOUS
  Administered 2014-08-30 (×2): 3 [IU] via SUBCUTANEOUS
  Administered 2014-08-30: 2 [IU] via SUBCUTANEOUS
  Administered 2014-08-31: 7 [IU] via SUBCUTANEOUS
  Administered 2014-09-01 (×2): 2 [IU] via SUBCUTANEOUS
  Administered 2014-09-02: 1 [IU] via SUBCUTANEOUS
  Administered 2014-09-03: 2 [IU] via SUBCUTANEOUS
  Administered 2014-09-04: 3 [IU] via SUBCUTANEOUS
  Administered 2014-09-04 – 2014-09-05 (×2): 1 [IU] via SUBCUTANEOUS
  Administered 2014-09-05: 2 [IU] via SUBCUTANEOUS
  Administered 2014-09-05: 3 [IU] via SUBCUTANEOUS
  Administered 2014-09-06 – 2014-09-07 (×3): 2 [IU] via SUBCUTANEOUS
  Administered 2014-09-08: 1 [IU] via SUBCUTANEOUS
  Administered 2014-09-08 – 2014-09-11 (×5): 2 [IU] via SUBCUTANEOUS

## 2014-08-28 MED ORDER — FUROSEMIDE 10 MG/ML IJ SOLN
80.0000 mg | Freq: Two times a day (BID) | INTRAMUSCULAR | Status: DC
  Administered 2014-08-28 – 2014-08-29 (×2): 80 mg via INTRAVENOUS
  Filled 2014-08-28 (×3): qty 8

## 2014-08-28 MED ORDER — SODIUM CHLORIDE 0.9 % IJ SOLN
3.0000 mL | INTRAMUSCULAR | Status: DC | PRN
  Administered 2014-09-01: 3 mL via INTRAVENOUS
  Filled 2014-08-28: qty 3

## 2014-08-28 MED ORDER — ACETAMINOPHEN 325 MG PO TABS
650.0000 mg | ORAL_TABLET | ORAL | Status: DC | PRN
  Administered 2014-09-01 – 2014-09-04 (×2): 650 mg via ORAL
  Filled 2014-08-28 (×2): qty 2

## 2014-08-28 MED ORDER — ALLOPURINOL 100 MG PO TABS
100.0000 mg | ORAL_TABLET | Freq: Two times a day (BID) | ORAL | Status: DC
  Administered 2014-08-28 – 2014-09-11 (×28): 100 mg via ORAL
  Filled 2014-08-28 (×29): qty 1

## 2014-08-28 MED ORDER — HYDRALAZINE HCL 10 MG PO TABS
10.0000 mg | ORAL_TABLET | Freq: Two times a day (BID) | ORAL | Status: DC
  Administered 2014-08-28 – 2014-09-03 (×12): 10 mg via ORAL
  Filled 2014-08-28 (×13): qty 1

## 2014-08-28 MED ORDER — AMLODIPINE BESYLATE 10 MG PO TABS
10.0000 mg | ORAL_TABLET | Freq: Every morning | ORAL | Status: DC
  Administered 2014-08-29 – 2014-09-08 (×11): 10 mg via ORAL
  Filled 2014-08-28 (×11): qty 1

## 2014-08-28 MED ORDER — ASPIRIN EC 81 MG PO TBEC
81.0000 mg | DELAYED_RELEASE_TABLET | Freq: Every day | ORAL | Status: DC
  Administered 2014-08-29 – 2014-09-11 (×14): 81 mg via ORAL
  Filled 2014-08-28 (×14): qty 1

## 2014-08-28 MED ORDER — NITROGLYCERIN 2 % TD OINT
1.0000 [in_us] | TOPICAL_OINTMENT | Freq: Once | TRANSDERMAL | Status: AC
  Administered 2014-08-28: 1 [in_us] via TOPICAL
  Filled 2014-08-28: qty 1

## 2014-08-28 MED ORDER — FUROSEMIDE 10 MG/ML IJ SOLN
40.0000 mg | Freq: Once | INTRAMUSCULAR | Status: AC
  Administered 2014-08-28: 40 mg via INTRAVENOUS
  Filled 2014-08-28: qty 4

## 2014-08-28 MED ORDER — NITROGLYCERIN 2 % TD OINT
0.5000 [in_us] | TOPICAL_OINTMENT | Freq: Four times a day (QID) | TRANSDERMAL | Status: DC
  Administered 2014-08-28 – 2014-08-31 (×13): 0.5 [in_us] via TOPICAL
  Filled 2014-08-28 (×2): qty 30

## 2014-08-28 MED ORDER — CARVEDILOL 3.125 MG PO TABS
78.1250 mg | ORAL_TABLET | Freq: Two times a day (BID) | ORAL | Status: DC
  Filled 2014-08-28 (×2): qty 1

## 2014-08-28 MED ORDER — ALBUTEROL SULFATE (2.5 MG/3ML) 0.083% IN NEBU
2.5000 mg | INHALATION_SOLUTION | RESPIRATORY_TRACT | Status: DC | PRN
  Administered 2014-08-30: 2.5 mg via RESPIRATORY_TRACT
  Filled 2014-08-28: qty 3

## 2014-08-28 MED ORDER — SODIUM CHLORIDE 0.9 % IJ SOLN
3.0000 mL | Freq: Two times a day (BID) | INTRAMUSCULAR | Status: DC
  Administered 2014-08-28 – 2014-09-10 (×24): 3 mL via INTRAVENOUS

## 2014-08-28 MED ORDER — CARVEDILOL 12.5 MG PO TABS
12.5000 mg | ORAL_TABLET | Freq: Two times a day (BID) | ORAL | Status: DC
  Administered 2014-08-28: 12.5 mg via ORAL
  Filled 2014-08-28 (×2): qty 1

## 2014-08-28 MED ORDER — ALBUTEROL SULFATE (2.5 MG/3ML) 0.083% IN NEBU
5.0000 mg | INHALATION_SOLUTION | Freq: Once | RESPIRATORY_TRACT | Status: AC
  Administered 2014-08-28: 5 mg via RESPIRATORY_TRACT
  Filled 2014-08-28: qty 6

## 2014-08-28 MED ORDER — ONDANSETRON HCL 4 MG/2ML IJ SOLN: 4.0000 mg | Freq: Four times a day (QID) | INTRAMUSCULAR | Status: DC | PRN

## 2014-08-28 MED ORDER — HEPARIN SODIUM (PORCINE) 5000 UNIT/ML IJ SOLN
5000.0000 [IU] | Freq: Three times a day (TID) | INTRAMUSCULAR | Status: DC
  Administered 2014-08-28 – 2014-09-11 (×39): 5000 [IU] via SUBCUTANEOUS
  Filled 2014-08-28 (×41): qty 1

## 2014-08-28 MED ORDER — SODIUM CHLORIDE 0.9 % IV SOLN: INTRAVENOUS | Status: DC

## 2014-08-28 MED ORDER — SODIUM CHLORIDE 0.9 % IV SOLN: 250.0000 mL | INTRAVENOUS | Status: DC | PRN

## 2014-08-28 MED ORDER — FUROSEMIDE 10 MG/ML IJ SOLN
80.0000 mg | Freq: Once | INTRAMUSCULAR | Status: AC
  Administered 2014-08-28: 80 mg via INTRAVENOUS
  Filled 2014-08-28: qty 8

## 2014-08-28 MED ORDER — CARVEDILOL 12.5 MG PO TABS
12.5000 mg | ORAL_TABLET | Freq: Two times a day (BID) | ORAL | Status: DC
  Administered 2014-08-29 – 2014-09-03 (×10): 12.5 mg via ORAL
  Filled 2014-08-28 (×13): qty 1

## 2014-08-28 NOTE — Consult Note (Signed)
Primary Cardiologist:Dr. Mare Ferrari  PCP: Irven Shelling, MD  Referred by ER MD  Referred for CHF  Chief Complaint: SOB HPI: 81 yoF with hx of chronic diastolic HF, With [facility]HC 12/14/15 and she was found to have a pulmonary artery pressure of 64/25 with a mean of 40. The right ventricular end-diastolic pressure was 15. The impression was biventricular heart failure with preserved left ventricular ejection fraction and moderate pulmonary artery hypertension. She did well but was then readmitted 07/28/14 at well for diastolic HF. Echocardiogram on 07/31/14 which showed an ejection fraction of 45-50% with grade 2 diastolic dysfunction and mild mitral regurgitation. Lexiscan Myoview on 05/05/14 which showed left ventricular systolic dysfunction with ejection fraction 44%. There was no ischemia. Other hx as below except + sleep apnea not yet rec'd mask.   Today she relates increased SOB over last several days. Wt up from dry wt of 181 to 184, but the DOE the worst. She sleeps with her head elevated and 2 pillows and that has not changed. But significant SOB walking anywhere, even to [place]. And very difficult getting through her bath. Her PCP increased her lasix from 60 to 80 mg and while she did pass more urine, her breathing was still short. No colds or fevers. No chest pain.  She has rec'd 80 mg IV lasix and NTG ointment. Troponin poc is negative and BNP 646. CXR with recurrent moderate CHF. EKG: SR with non specific intraventricular conduction block. Similar to previous EKGs.   Past Medical History  Diagnosis Date  . Hypertension   . High cholesterol   . Kidney stones   . Type II diabetes mellitus with stage 4 chronic kidney disease   . GERD (gastroesophageal reflux disease)   . History of gout   . CAP (community acquired pneumonia) 01/09/2014    "first time I've ever had it"  . Diastolic congestive heart failure     Past Surgical History   Procedure Laterality Date  . Kidney stone surgery  10-05-12    "cut me on the side"  . Colonoscopy with propofol N/A 10/23/2012    Procedure: COLONOSCOPY WITH PROPOFOL; Surgeon: [name], MD; Location: [facility]; Service: Endoscopy; Laterality: N/A;  . Abdominal hysterectomy  ~ 1974  . Lithotripsy  1980's?  . Cataract extraction w/ intraocular lens implant, bilateral Bilateral 2014  . Right heart catheterization N/A 04/15/2014    Procedure: RIGHT HEART CATH; Surgeon: Sanda Klein, MD; Location: [facility] CATH LAB; Service: Cardiovascular; Laterality: N/A;    Family History  Problem Relation Age of Onset  . Hypertension Mother   . CAD Neg Hx    Social History:  reports that she has never smoked. She has never used smokeless tobacco. She reports that she does not drink alcohol or use illicit drugs.  Allergies:  Allergies  Allergen Reactions  . Simvastatin Other (See Comments)    Myalgias- MD took patient off med  . Codeine Other (See Comments)    Unknown; pt can't remember. It was in the '70s    OUTPATIENT MEDICATIONS: No current facility-administered medications on file prior to encounter.   Current Outpatient Prescriptions on File Prior to Encounter  Medication Sig Dispense Refill  . albuterol (PROVENTIL HFA;VENTOLIN HFA) 108 (90 BASE) MCG/ACT inhaler Inhale 2 puffs into the lungs every 6 (six) hours as needed for wheezing or shortness of breath. 1 Inhaler 0  . allopurinol (ZYLOPRIM) 100 MG tablet Take 100 mg by mouth 2 (two) times daily.    [name] amLODipine (NORVASC)  10 MG tablet Take 10 mg by mouth every morning.     [name] aspirin EC 81 MG tablet Take 81 mg by mouth daily.    . bimatoprost (LUMIGAN) 0.01 % SOLN Place 1 drop into both eyes at bedtime.     . carvedilol (COREG) 6.25 MG tablet Take 1 tablet by mouth daily.    . cholecalciferol (VITAMIN D) 1000 UNITS  tablet Take 2,000 Units by mouth daily.     . Ferrous Sulfate Dried 200 (65 FE) MG TABS Take 1 tablet by mouth daily.    . furosemide (LASIX) 40 MG tablet Take 20-40 mg by mouth 2 (two) times daily. Patient taking one (1) tablet in am and half (1/2) tablet in pm 60 tablet 1  . glimepiride (AMARYL) 1 MG tablet Take 1 mg by mouth daily.    . hydrALAZINE (APRESOLINE) 10 MG tablet Take 10 mg by mouth 2 (two) times daily.    . isosorbide mononitrate (IMDUR) 30 MG 24 hr tablet Take 1 tablet (30 mg total) by mouth daily. 30 tablet 0  . ondansetron (ZOFRAN ODT) 4 MG disintegrating tablet Take 1 tablet (4 mg total) by mouth every 8 (eight) hours as needed for nausea or vomiting. 12 tablet 0      Lab Results Last 48 Hours    Results for orders placed or performed during the hospital encounter of 08/28/14 (from the past 48 hour(s))  Brain natriuretic peptide Status: Abnormal   Collection Time: 08/28/14 12:04 PM  Result Value Ref Range   B Natriuretic Peptide 646.5 (H) 0.0 - 100.0 pg/mL  CBC with Differential Status: Abnormal   Collection Time: 08/28/14 12:04 PM  Result Value Ref Range   WBC 6.4 4.0 - 10.5 K/uL   RBC 3.17 (L) 3.87 - 5.11 MIL/uL   Hemoglobin 8.9 (L) 12.0 - 15.0 g/dL   HCT 28.0 (L) 36.0 - 46.0 %   MCV 88.3 78.0 - 100.0 fL   MCH 28.1 26.0 - 34.0 pg   MCHC 31.8 30.0 - 36.0 g/dL   RDW 15.4 11.5 - 15.5 %   Platelets 279 150 - 400 K/uL   Neutrophils Relative % 74 43 - 77 %   Neutro Abs 4.7 1.7 - 7.7 K/uL   Lymphocytes Relative 11 (L) 12 - 46 %   Lymphs Abs 0.7 0.7 - 4.0 K/uL   Monocytes Relative 12 3 - 12 %   Monocytes Absolute 0.7 0.1 - 1.0 K/uL   Eosinophils Relative 3 0 - 5 %   Eosinophils Absolute 0.2 0.0 - 0.7 K/uL   Basophils Relative 0 0 - 1 %   Basophils Absolute 0.0 0.0 - 0.1 K/uL  Comprehensive metabolic panel Status: Abnormal    Collection Time: 08/28/14 12:04 PM  Result Value Ref Range   Sodium 142 135 - 145 mmol/L   Potassium 3.6 3.5 - 5.1 mmol/L   Chloride 105 101 - 111 mmol/L   CO2 25 22 - 32 mmol/L   Glucose, Bld 92 65 - 99 mg/dL   BUN 39 (H) 6 - 20 mg/dL   Creatinine, Ser 2.13 (H) 0.44 - 1.00 mg/dL   Calcium 9.5 8.9 - 10.3 mg/dL   Total Protein 7.0 6.5 - 8.1 g/dL   Albumin 3.4 (L) 3.5 - 5.0 g/dL   AST 31 15 - 41 U/L   ALT 23 14 - 54 U/L   Alkaline Phosphatase 101 38 - 126 U/L   Total Bilirubin 0.6 0.3 - 1.2 mg/dL   GFR  calc non Af Amer 21 (L) >60 mL/min   GFR calc Af Amer 25 (L) >60 mL/min    Comment: (NOTE) The eGFR has been calculated using the CKD EPI equation. This calculation has not been validated in all clinical situations. eGFR's persistently <60 mL/min signify possible Chronic Kidney Disease.    Anion gap 12 5 - 15  I-Stat Troponin, ED (not at [facility]) Status: None   Collection Time: 08/28/14 12:27 PM  Result Value Ref Range   Troponin i, poc 0.02 0.00 - 0.08 ng/mL   Comment 3       Comment: Due to the release kinetics of cTnI, a negative result within the first hours of the onset of symptoms does not rule out myocardial infarction with certainty. If myocardial infarction is still suspected, repeat the test at appropriate intervals.   I-Stat Chem 8, ED Status: Abnormal   Collection Time: 08/28/14 12:35 PM  Result Value Ref Range   Sodium 142 135 - 145 mmol/L   Potassium 3.6 3.5 - 5.1 mmol/L   Chloride 103 101 - 111 mmol/L   BUN 41 (H) 6 - 20 mg/dL   Creatinine, Ser 2.10 (H) 0.44 - 1.00 mg/dL   Glucose, Bld 93 65 - 99 mg/dL   Calcium, Ion 1.18 1.13 - 1.30 mmol/L   TCO2 25 0 - 100 mmol/L   Hemoglobin 11.2 (L) 12.0 - 15.0 g/dL   HCT 33.0 (L) 36.0 - 46.0 %      Imaging Results (Last 48 hours)    Dg Chest Port 1 View  08/28/2014  CLINICAL DATA: Congestive heart failure. Shortness of breath. EXAM: PORTABLE CHEST - 1 VIEW COMPARISON: 07/29/2014. FINDINGS: Cardiopericardial silhouette is partially obscured by effusions, edema and atelectasis. Cardiomegaly remains present. Aortic arch atherosclerosis. Interstitial and airspace opacity with basilar predominance compatible with pulmonary edema. Bilateral pleural effusions. IMPRESSION: Recurrent moderate CHF. Electronically Signed By: Dereck Ligas M.D. On: 08/28/2014 13:02     ROS: General:no colds or fevers, + wt increase from 181 to 184 on her home scales,  Skin:no rashes or ulcers HEENT:no blurred vision, no congestion CV:see HPI PUL:see HPI GI:no diarrhea constipation or melena, no indigestion GU:no hematuria, no dysuria MS:no joint pain, no claudication Neuro:no syncope, no lightheadedness Endo:+ diabetes, no thyroid disease  Blood pressure 127/59, pulse 63, temperature 97.5 F (36.4 C), temperature source Oral, resp. rate 14, weight 194 lb 4 oz (88.111 kg), SpO2 97 %. PE: General:Pleasant affect, NAD Skin:Warm and dry, brisk capillary refill HEENT:normocephalic, sclera clear, mucus membranes moist Neck:supple, no JVD, no bruits, no adenopathy  Heart:S1S2 RRR without murmur, gallup, rub or click Lungs:diminished in bases with scattered rales, no rhonchi or wheezes TDH:RCBUL, soft, non tender, + BS, do not palpate liver spleen or masses Ext:no lower ext edema, 2+ pedal pulses, 2+ radial pulses Neuro:alert and oriented X 3, MAE, follows commands, + facial symmetry   Assessment/Plan Principal Problem:  Acute on chronic combined systolic and diastolic CHF (congestive heart failure)- give Lasix 80 BID, monitor Cr. Serial troponin, We will follow along, she has had several admits, may need help with home management. States she only eats out once a week. Her meds were adjusted with hydralazine causing rt upper quad pain.   Active Problems:   Chronic diastolic heart failure  Type 2 diabetes mellitus with stage 4 chronic kidney disease  SOB (shortness of breath)  Bradycardia- beta blocker decreased  Sleep apnea    [facility] Nurse Practitioner Certified [place] Pager [phone]  or after 5pm or weekends call [phone] 08/28/2014, 3:19 PM        Personally seen and examined. Agree with above. Recurrent admissions with dyspnea.  ?pulm edema vs. Underlying deconditioning or pulmonary condition. Has seen Dr. Gwenette Greet in consult.   Will try IV lasix once again.   Weight loss and conditioning, PT.   Right sided pressures are elevated as well. Continue to treat left heart disease.   Candee Furbish, MD

## 2014-08-28 NOTE — ED Notes (Signed)
[place] sandwich and water given

## 2014-08-28 NOTE — H&P (Deleted)
Doris Howell is an 79 y.o. female.    Primary Cardiologist:Dr. Mare Ferrari  PCP: Irven Shelling, MD  Chief Complaint: SOB HPI: 61 yoF with hx of chronic diastolic HF,  With RHC  04/16/08 and she was found to have a pulmonary artery pressure of 64/25 with a mean of 40. The right ventricular end-diastolic pressure was 15. The impression was biventricular heart failure with preserved left ventricular ejection fraction and moderate pulmonary artery hypertension. She did well but was then readmitted 07/28/14 at well for diastolic HF.   Echocardiogram on 07/31/14 which showed an ejection fraction of 45-50% with grade 2 diastolic dysfunction and mild mitral regurgitation.  Lexiscan Myoview on 05/05/14 which showed left ventricular systolic dysfunction with ejection fraction 44%. There was no ischemia.  Other hx as below except + sleep apnea not yet rec'd mask.    Today she relates increased SOB over last several days.  Wt up from dry wt of 181 to 184, but the DOE the worst.  She sleeps with her head elevated and 2 pillows and that has not changed.  But significant SOB walking anywhere, even to [place].  And very difficult getting through her bath.   Her PCP increased her lasix from 60 to 80 mg and while she did pass more urine, her breathing was still short.  No colds or fevers.  No chest pain.  She has rec'd 80 mg IV lasix and NTG ointment.  Troponin poc is negative and BNP 646.  CXR with recurrent moderate CHF.    EKG:  SR with non specific intraventricular conduction block. Similar to previous EKGs.    Past Medical History  Diagnosis Date  . Hypertension   . High cholesterol   . Kidney stones   . Type II diabetes mellitus with stage 4 chronic kidney disease   . GERD (gastroesophageal reflux disease)   . History of gout   . CAP (community acquired pneumonia) 01/09/2014    "first time I've ever had it"  . Diastolic congestive heart failure     Past Surgical History  Procedure  Laterality Date  . Kidney stone surgery  10-05-12    "cut me on the side"  . Colonoscopy with propofol N/A 10/23/2012    Procedure: COLONOSCOPY WITH PROPOFOL;  Surgeon: [name], MD;  Location: [facility]OS[facility];  Service: Endoscopy;  Laterality: N/A;  . Abdominal hysterectomy  ~ 1974  . Lithotripsy  1980's?  . Cataract extraction w/ intraocular lens  implant, bilateral Bilateral 2014  . Right heart catheterization N/A 04/15/2014    Procedure: RIGHT HEART CATH;  Surgeon: Sanda Klein, MD;  Location: [facility] CATH LAB;  Service: Cardiovascular;  Laterality: N/A;    Family History  Problem Relation Age of Onset  . Hypertension Mother   . CAD Neg Hx    Social History:  reports that she has never smoked. She has never used smokeless tobacco. She reports that she does not drink alcohol or use illicit drugs.  Allergies:  Allergies  Allergen Reactions  . Simvastatin Other (See Comments)    Myalgias- MD took patient off med  . Codeine Other (See Comments)    Unknown; pt can't remember. It was in the '70s    OUTPATIENT MEDICATIONS: No current facility-administered medications on file prior to encounter.   Current Outpatient Prescriptions on File Prior to Encounter  Medication Sig Dispense Refill  . albuterol (PROVENTIL HFA;VENTOLIN HFA) 108 (90 BASE) MCG/ACT inhaler Inhale 2 puffs  into the lungs every 6 (six) hours as needed for wheezing or shortness of breath. 1 Inhaler 0  . allopurinol (ZYLOPRIM) 100 MG tablet Take 100 mg by mouth 2 (two) times daily.    [name] amLODipine (NORVASC) 10 MG tablet Take 10 mg by mouth every morning.     [name] aspirin EC 81 MG tablet Take 81 mg by mouth daily.    . bimatoprost (LUMIGAN) 0.01 % SOLN Place 1 drop into both eyes at bedtime.     . carvedilol (COREG) 6.25 MG tablet Take 1 tablet by mouth daily.    . cholecalciferol (VITAMIN D) 1000 UNITS tablet Take 2,000 Units by mouth daily.     . Ferrous Sulfate Dried 200 (65 FE) MG TABS Take 1 tablet by mouth daily.      . furosemide (LASIX) 40 MG tablet Take 20-40 mg by mouth 2 (two) times daily. Patient taking one (1) tablet in am and half (1/2) tablet in pm 60 tablet 1  . glimepiride (AMARYL) 1 MG tablet Take 1 mg by mouth daily.    . hydrALAZINE (APRESOLINE) 10 MG tablet Take 10 mg by mouth 2 (two) times daily.    . isosorbide mononitrate (IMDUR) 30 MG 24 hr tablet Take 1 tablet (30 mg total) by mouth daily. 30 tablet 0  . ondansetron (ZOFRAN ODT) 4 MG disintegrating tablet Take 1 tablet (4 mg total) by mouth every 8 (eight) hours as needed for nausea or vomiting. 12 tablet 0     Results for orders placed or performed during the hospital encounter of 08/28/14 (from the past 48 hour(s))  Brain natriuretic peptide     Status: Abnormal   Collection Time: 08/28/14 12:04 PM  Result Value Ref Range   B Natriuretic Peptide 646.5 (H) 0.0 - 100.0 pg/mL  CBC with Differential     Status: Abnormal   Collection Time: 08/28/14 12:04 PM  Result Value Ref Range   WBC 6.4 4.0 - 10.5 K/uL   RBC 3.17 (L) 3.87 - 5.11 MIL/uL   Hemoglobin 8.9 (L) 12.0 - 15.0 g/dL   HCT 28.0 (L) 36.0 - 46.0 %   MCV 88.3 78.0 - 100.0 fL   MCH 28.1 26.0 - 34.0 pg   MCHC 31.8 30.0 - 36.0 g/dL   RDW 15.4 11.5 - 15.5 %   Platelets 279 150 - 400 K/uL   Neutrophils Relative % 74 43 - 77 %   Neutro Abs 4.7 1.7 - 7.7 K/uL   Lymphocytes Relative 11 (L) 12 - 46 %   Lymphs Abs 0.7 0.7 - 4.0 K/uL   Monocytes Relative 12 3 - 12 %   Monocytes Absolute 0.7 0.1 - 1.0 K/uL   Eosinophils Relative 3 0 - 5 %   Eosinophils Absolute 0.2 0.0 - 0.7 K/uL   Basophils Relative 0 0 - 1 %   Basophils Absolute 0.0 0.0 - 0.1 K/uL  Comprehensive metabolic panel     Status: Abnormal   Collection Time: 08/28/14 12:04 PM  Result Value Ref Range   Sodium 142 135 - 145 mmol/L   Potassium 3.6 3.5 - 5.1 mmol/L   Chloride 105 101 - 111 mmol/L   CO2 25 22 - 32 mmol/L   Glucose, Bld 92 65 - 99 mg/dL   BUN 39 (H) 6 - 20 mg/dL   Creatinine, Ser 2.13 (H) 0.44 - 1.00  mg/dL   Calcium 9.5 8.9 - 10.3 mg/dL   Total Protein 7.0 6.5 - 8.1 g/dL  Albumin 3.4 (L) 3.5 - 5.0 g/dL   AST 31 15 - 41 U/L   ALT 23 14 - 54 U/L   Alkaline Phosphatase 101 38 - 126 U/L   Total Bilirubin 0.6 0.3 - 1.2 mg/dL   GFR calc non Af Amer 21 (L) >60 mL/min   GFR calc Af Amer 25 (L) >60 mL/min    Comment: (NOTE) The eGFR has been calculated using the CKD EPI equation. This calculation has not been validated in all clinical situations. eGFR's persistently <60 mL/min signify possible Chronic Kidney Disease.    Anion gap 12 5 - 15  I-Stat Troponin, ED (not at [facility])     Status: None   Collection Time: 08/28/14 12:27 PM  Result Value Ref Range   Troponin i, poc 0.02 0.00 - 0.08 ng/mL   Comment 3            Comment: Due to the release kinetics of cTnI, a negative result within the first hours of the onset of symptoms does not rule out myocardial infarction with certainty. If myocardial infarction is still suspected, repeat the test at appropriate intervals.   I-Stat Chem 8, ED     Status: Abnormal   Collection Time: 08/28/14 12:35 PM  Result Value Ref Range   Sodium 142 135 - 145 mmol/L   Potassium 3.6 3.5 - 5.1 mmol/L   Chloride 103 101 - 111 mmol/L   BUN 41 (H) 6 - 20 mg/dL   Creatinine, Ser 2.10 (H) 0.44 - 1.00 mg/dL   Glucose, Bld 93 65 - 99 mg/dL   Calcium, Ion 1.18 1.13 - 1.30 mmol/L   TCO2 25 0 - 100 mmol/L   Hemoglobin 11.2 (L) 12.0 - 15.0 g/dL   HCT 33.0 (L) 36.0 - 46.0 %   Dg Chest Port 1 View  08/28/2014   CLINICAL DATA:  Congestive heart failure.  Shortness of breath.  EXAM: PORTABLE CHEST - 1 VIEW  COMPARISON:  07/29/2014.  FINDINGS: Cardiopericardial silhouette is partially obscured by effusions, edema and atelectasis. Cardiomegaly remains present. Aortic arch atherosclerosis. Interstitial and airspace opacity with basilar predominance compatible with pulmonary edema. Bilateral pleural effusions.  IMPRESSION: Recurrent moderate CHF.   Electronically Signed    By: Dereck Ligas M.D.   On: 08/28/2014 13:02    ROS: General:no colds or fevers, + wt increase from 181 to 184 on her home scales,  Skin:no rashes or ulcers HEENT:no blurred vision, no congestion CV:see HPI PUL:see HPI GI:no diarrhea constipation or melena, no indigestion GU:no hematuria, no dysuria MS:no joint pain, no claudication Neuro:no syncope, no lightheadedness Endo:+ diabetes, no thyroid disease   Blood pressure 127/59, pulse 63, temperature 97.5 F (36.4 C), temperature source Oral, resp. rate 14, weight 194 lb 4 oz (88.111 kg), SpO2 97 %. PE: General:Pleasant affect, NAD Skin:Warm and dry, brisk capillary refill HEENT:normocephalic, sclera clear, mucus membranes moist Neck:supple, no JVD, no bruits, no adenopathy  Heart:S1S2 RRR without murmur, gallup, rub or click Lungs:diminished in bases with scattered rales, no rhonchi or wheezes QPY:PPJKD, soft, non tender, + BS, do not palpate liver spleen or masses Ext:no lower ext edema, 2+ pedal pulses, 2+ radial pulses Neuro:alert and oriented X 3, MAE, follows commands, + facial symmetry    Assessment/Plan Principal Problem:   Acute on chronic combined systolic and diastolic CHF (congestive heart failure)- give Lasix 80 BID, monitor Cr. Serial troponin,  We will follow along, she has had several admits, may need help with home management. States she only  eats out once a week. Her meds were adjusted with hydralazine causing rt upper quad pain.    Active Problems:   Chronic diastolic heart failure   Type 2 diabetes mellitus with stage 4 chronic kidney disease   SOB (shortness of breath)   Bradycardia- beta blocker decreased   Sleep apnea    [facility] Nurse Practitioner Certified [place] Pager [phone] or after 5pm or weekends call [phone] 08/28/2014, 3:19 PM

## 2014-08-28 NOTE — H&P (Signed)
[facility] History and Physical  Doris Howell JKK:[id number] DOB: Apr 12, 1935 DOA: 08/28/2014   PCP: Irven Shelling, MD  Specialists: Dr. Mare Ferrari is her cardiologist  Chief Complaint: Shortness of breath since Wednesday last week  HPI: Doris Howell is a 79 y.o. female with a past medical history of diastolic congestive heart failure, hypertension, type 2 diabetes, who was hospitalized in April for congestive heart failure. She had a follow-up appointment with her cardiologist on May 3. She underwent a right heart catheterization in January and was found to have a pulmonary artery pressure of 64/25 with 40. She was thought to have biventricular heart failure with preserved left and ejection fraction. She also underwent a lexican myoview in January which showed left ventricle systolic dysfunction with EF of 44% without any ischemia.  Patient tells me that after her last hospitalization she was actually doing well. She was checking her weight on a daily basis. She was adhering to her dietary restrictions. She does not consume any salt. She was compliant with her medications. Recently her primary care physician increased the dose of Lasix. However, Wednesday of last week she started developing shortness of breath mainly with exertion. She also had symptoms suggestive of PND. She uses 2 pillows to sleep and has an increased number of pillows in the last few days. She has noticed leg swelling and her weight went up from 181 pounds to 184 pounds. Denies any nausea, vomiting. No chest pain. No fever, no chills. She has been extremely fatigued over the last few days. She tells me that she has been hospitalized 4-5 times during this year for similar problems. She came in because her symptoms were not improving.  Home Medications: Prior to Admission medications   Medication Sig Start Date End Date Taking? Authorizing Provider  albuterol (PROVENTIL HFA;VENTOLIN HFA) 108 (90 BASE)  MCG/ACT inhaler Inhale 2 puffs into the lungs every 6 (six) hours as needed for wheezing or shortness of breath. 01/13/14  Yes [name], MD  allopurinol (ZYLOPRIM) 100 MG tablet Take 100 mg by mouth 2 (two) times daily.   Yes Historical Provider, MD  amLODipine (NORVASC) 10 MG tablet Take 10 mg by mouth every morning.    Yes Historical Provider, MD  aspirin EC 81 MG tablet Take 81 mg by mouth daily.   Yes Historical Provider, MD  bimatoprost (LUMIGAN) 0.01 % SOLN Place 1 drop into both eyes at bedtime.    Yes Historical Provider, MD  carvedilol (COREG) 6.25 MG tablet Take 12.5 tablets by mouth 2 (two) times daily with a meal.  05/21/14  Yes Historical Provider, MD  cholecalciferol (VITAMIN D) 1000 UNITS tablet Take 2,000 Units by mouth daily.    Yes Historical Provider, MD  Ferrous Sulfate Dried 200 (65 FE) MG TABS Take 1 tablet by mouth daily.   Yes Historical Provider, MD  furosemide (LASIX) 40 MG tablet Take 40 mg by mouth 2 (two) times daily.  07/17/14  Yes Darlin Coco, MD  glimepiride (AMARYL) 1 MG tablet Take 1 mg by mouth daily. 06/09/14  Yes Historical Provider, MD  hydrALAZINE (APRESOLINE) 10 MG tablet Take 10 mg by mouth 2 (two) times daily.   Yes Historical Provider, MD  isosorbide mononitrate (IMDUR) 30 MG 24 hr tablet Take 1 tablet (30 mg total) by mouth daily. 08/02/14  Yes Janece Canterbury, MD  ondansetron (ZOFRAN ODT) 4 MG disintegrating tablet Take 1 tablet (4 mg total) by mouth every 8 (eight) hours as needed for nausea or vomiting.  Patient not taking: Reported on 08/28/2014 04/06/14   [name], MD    Allergies:  Allergies  Allergen Reactions  . Simvastatin Other (See Comments)    Myalgias- MD took patient off med  . Codeine Other (See Comments)    Unknown; pt can't remember. It was in the '70s    Past Medical History: Past Medical History  Diagnosis Date  . Hypertension   . High cholesterol   . Kidney stones   . Type II diabetes mellitus with stage 4 chronic  kidney disease   . GERD (gastroesophageal reflux disease)   . History of gout   . CAP (community acquired pneumonia) 01/09/2014    "first time I've ever had it"  . Diastolic congestive heart failure     Past Surgical History  Procedure Laterality Date  . Kidney stone surgery  10-05-12    "cut me on the side"  . Colonoscopy with propofol N/A 10/23/2012    Procedure: COLONOSCOPY WITH PROPOFOL;  Surgeon: [name], MD;  Location: [facility]OS[facility];  Service: Endoscopy;  Laterality: N/A;  . Abdominal hysterectomy  ~ 1974  . Lithotripsy  1980's?  . Cataract extraction w/ intraocular lens  implant, bilateral Bilateral 2014  . Right heart catheterization N/A 04/15/2014    Procedure: RIGHT HEART CATH;  Surgeon: Sanda Klein, MD;  Location: [facility] CATH LAB;  Service: Cardiovascular;  Laterality: N/A;    Social History: She lives in [place] with her husband. Denies any history of smoking, alcohol use or illicit drug use. Uses a cane occasionally.  Family History:  Family History  Problem Relation Age of Onset  . Hypertension Mother   . CAD Neg Hx   . Diabetes Brother      Review of Systems - History obtained from the patient General ROS: positive for  - fatigue Psychological ROS: negative Ophthalmic ROS: negative ENT ROS: negative Allergy and Immunology ROS: negative Hematological and Lymphatic ROS: negative Endocrine ROS: negative Respiratory ROS: as in hpi Cardiovascular ROS: as in hpi Gastrointestinal ROS: no abdominal pain, change in bowel habits, or black or bloody stools Genito-Urinary ROS: no dysuria, trouble voiding, or hematuria Musculoskeletal ROS: negative Neurological ROS: no TIA or stroke symptoms Dermatological ROS: negative  Physical Examination  Filed Vitals:   08/28/14 1545 08/28/14 1601 08/28/14 1615 08/28/14 1700  BP: 133/59 133/59 137/68 124/59  Pulse: 64 67 67 67  Temp:      TempSrc:      Resp: _0 Weight:      SpO2: 91% 93% 93% 94%    BP  124/59 mmHg  Pulse 67  Temp(Src) 97.5 F (36.4 C) (Oral)  Resp 19  Wt 88.111 kg (194 lb 4 oz)  SpO2 94%  General appearance: alert, cooperative, appears stated age and no distress Head: Normocephalic, without obvious abnormality, atraumatic Eyes: conjunctivae/corneas clear. PERRL, EOM's intact.  Throat: lips, mucosa, and tongue normal; teeth and gums normal Neck: no adenopathy, no carotid bruit, supple, symmetrical, trachea midline and thyroid not enlarged, symmetric, no tenderness/mass/nodules Resp: Crackles bilateral about one third of the lung fields. Cardio: regular rate and rhythm, S1, S2 normal, no murmur, click, rub or gallop and JVD noted. Angle of jaw GI: soft, non-tender; bowel sounds normal; no masses,  no organomegaly Extremities: 1+ pitting edema bilateral lower extremities. Pulses: 2+ and symmetric Skin: Skin color, texture, turgor normal. No rashes or lesions Lymph nodes: Cervical, supraclavicular, and axillary nodes normal. Neurologic: Good and oriented 3. Cranial nerves II-12 intact.  Motor strength normal bilateral upper and lower extremities.  Laboratory Data: Results for orders placed or performed during the hospital encounter of 08/28/14 (from the past 48 hour(s))  Brain natriuretic peptide     Status: Abnormal   Collection Time: 08/28/14 12:04 PM  Result Value Ref Range   B Natriuretic Peptide 646.5 (H) 0.0 - 100.0 pg/mL  CBC with Differential     Status: Abnormal   Collection Time: 08/28/14 12:04 PM  Result Value Ref Range   WBC 6.4 4.0 - 10.5 K/uL   RBC 3.17 (L) 3.87 - 5.11 MIL/uL   Hemoglobin 8.9 (L) 12.0 - 15.0 g/dL   HCT 28.0 (L) 36.0 - 46.0 %   MCV 88.3 78.0 - 100.0 fL   MCH 28.1 26.0 - 34.0 pg   MCHC 31.8 30.0 - 36.0 g/dL   RDW 15.4 11.5 - 15.5 %   Platelets 279 150 - 400 K/uL   Neutrophils Relative % 74 43 - 77 %   Neutro Abs 4.7 1.7 - 7.7 K/uL   Lymphocytes Relative 11 (L) 12 - 46 %   Lymphs Abs 0.7 0.7 - 4.0 K/uL   Monocytes Relative 12 3 -  12 %   Monocytes Absolute 0.7 0.1 - 1.0 K/uL   Eosinophils Relative 3 0 - 5 %   Eosinophils Absolute 0.2 0.0 - 0.7 K/uL   Basophils Relative 0 0 - 1 %   Basophils Absolute 0.0 0.0 - 0.1 K/uL  Comprehensive metabolic panel     Status: Abnormal   Collection Time: 08/28/14 12:04 PM  Result Value Ref Range   Sodium 142 135 - 145 mmol/L   Potassium 3.6 3.5 - 5.1 mmol/L   Chloride 105 101 - 111 mmol/L   CO2 25 22 - 32 mmol/L   Glucose, Bld 92 65 - 99 mg/dL   BUN 39 (H) 6 - 20 mg/dL   Creatinine, Ser 2.13 (H) 0.44 - 1.00 mg/dL   Calcium 9.5 8.9 - 10.3 mg/dL   Total Protein 7.0 6.5 - 8.1 g/dL   Albumin 3.4 (L) 3.5 - 5.0 g/dL   AST 31 15 - 41 U/L   ALT 23 14 - 54 U/L   Alkaline Phosphatase 101 38 - 126 U/L   Total Bilirubin 0.6 0.3 - 1.2 mg/dL   GFR calc non Af Amer 21 (L) >60 mL/min   GFR calc Af Amer 25 (L) >60 mL/min    Comment: (NOTE) The eGFR has been calculated using the CKD EPI equation. This calculation has not been validated in all clinical situations. eGFR's persistently <60 mL/min signify possible Chronic Kidney Disease.    Anion gap 12 5 - 15  I-Stat Troponin, ED (not at [facility])     Status: None   Collection Time: 08/28/14 12:27 PM  Result Value Ref Range   Troponin i, poc 0.02 0.00 - 0.08 ng/mL   Comment 3            Comment: Due to the release kinetics of cTnI, a negative result within the first hours of the onset of symptoms does not rule out myocardial infarction with certainty. If myocardial infarction is still suspected, repeat the test at appropriate intervals.   I-Stat Chem 8, ED     Status: Abnormal   Collection Time: 08/28/14 12:35 PM  Result Value Ref Range   Sodium 142 135 - 145 mmol/L   Potassium 3.6 3.5 - 5.1 mmol/L   Chloride 103 101 - 111 mmol/L   BUN 41 (H)  6 - 20 mg/dL   Creatinine, Ser 2.10 (H) 0.44 - 1.00 mg/dL   Glucose, Bld 93 65 - 99 mg/dL   Calcium, Ion 1.18 1.13 - 1.30 mmol/L   TCO2 25 0 - 100 mmol/L   Hemoglobin 11.2 (L) 12.0 - 15.0  g/dL   HCT 33.0 (L) 36.0 - 46.0 %  CBG monitoring, ED     Status: Abnormal   Collection Time: 08/28/14  5:02 PM  Result Value Ref Range   Glucose-Capillary 123 (H) 65 - 99 mg/dL    Radiology Reports: Dg Chest Port 1 View  08/28/2014   CLINICAL DATA:  Congestive heart failure.  Shortness of breath.  EXAM: PORTABLE CHEST - 1 VIEW  COMPARISON:  07/29/2014.  FINDINGS: Cardiopericardial silhouette is partially obscured by effusions, edema and atelectasis. Cardiomegaly remains present. Aortic arch atherosclerosis. Interstitial and airspace opacity with basilar predominance compatible with pulmonary edema. Bilateral pleural effusions.  IMPRESSION: Recurrent moderate CHF.   Electronically Signed   By: Dereck Ligas M.D.   On: 08/28/2014 13:02    Electrocardiogram: Poor quality EKG shows sinus rhythm in the 60s. Intraventricular conduction delays noted. Nonspecific T-wave changes. Similar to previous EKG.  Problem List  Principal Problem:   Acute on chronic combined systolic and diastolic CHF (congestive heart failure) Active Problems:   Chronic diastolic heart failure   Type 2 diabetes mellitus with stage 4 chronic kidney disease   SOB (shortness of breath)   Bradycardia- beta blocker decreased   Sleep apnea   Acute exacerbation of CHF (congestive heart failure)   Acute diastolic CHF (congestive heart failure)   Assessment: This is a 79 year old African-American female with past medical history as stated earlier, who presents with worsening dyspnea on exertion. She has lower extremity edema. Chest x-ray suggestive of primary edema. She likely has acute on chronic diastolic congestive heart failure.  Plan: #1 Acute diastolic congestive heart failure: There is some evidence for biventricular heart failure as well based on previous studies. She'll be placed on intravenous Lasix. No ACE inhibitor is due to chronic kidney disease. Continue with her beta blocker. Cardiology has been consulted and  they will follow closely. Echocardiogram was done recently in April which showed EF of 45-50% and grade 2 diastolic dysfunction, so we will not order another one. Strict ins and outs, daily weights. Foley catheter if needed. Nitroglycerin ointment for now. This can be changed over to her oral nitrates tomorrow morning  #2 Chronic kidney disease stage IV: Creatinine close to baseline. Monitor closely while she is being aggressively diuresed. Monitor urine output.  #3 history of diabetes mellitus type 2 With kidney complications: Sliding scale insulin coverage. Last HbA1c available is from October 2015, which was 6.5. This will be repeated.  #4 history of obstructive sleep apnea: Continue with CPAP overnight.  #5 history of essential hypertension: Continue with home medications including amlodipine.  #6 history of gout: Continue with allopurinol.  #7 Normocytic anemia: Hemoglobin, more or less, close to baseline. Continue to monitor.   DVT Prophylaxis: Heparin subcutaneously  Code Status: Full code  Family Communication: Discussed with the patient and her daughter  Disposition Plan: Admit to telemetry   Further management decisions will depend on results of further testing and patient's response to treatment.   [facility]  [facility] Pager [phone]  If 7PM-7AM, please contact night-coverage [email or web address] Password [facility]  08/28/2014, 5:26 PM

## 2014-08-28 NOTE — ED Notes (Addendum)
Pt reports SOB with CHF; sent from from MD office;been feeling this way for week.  Bilateral foot swelling. Denies chest pain currently but has had some over the past week. Pt was very SOB  upon standing on scale with staff assistance.

## 2014-08-28 NOTE — ED Notes (Signed)
Report to [place], Therapist, sports.  PT CARE TRANSFERRED

## 2014-08-28 NOTE — ED Notes (Signed)
[facility] called to advise patient needs to be worked up for decompensated CHF.

## 2014-08-28 NOTE — ED Provider Notes (Signed)
CSN: [id number]     Arrival date & time 08/28/14  1129 History   First MD Initiated Contact with Patient 08/28/14 1214     Chief Complaint  Patient presents with  . Congestive Heart Failure  . Shortness of Breath     (Consider location/radiation/quality/duration/timing/severity/associated sxs/prior Treatment) HPI   Doris Howell is a 79 y.o. female complaining of increasing shortness of breath over the last several weeks significantly worsening in the last 24 hours associated with increasing peripheral edema. Patient has been having her consistent to para low orthopnea but more significant PND, states that she has to get up in the middle of the night and sit on the side of the bed which is atypical she is been compliant with her Lasix takes 40 mg daily. Has gained 3 pounds in the last 5 days (dry weight 181). Note that the weight that we have today is 194. Patient denies chest pain, cough, fever, chills. She states that she did have chest pain which resolved 5 days ago. She thinks this is secondary to isosorbide. Which initially she was resuscitated 3 times per day, Dr. told her to take it 2 times per day, she's actually been taking it 1 time per day and states that this has resolved her pain.  Audiologist is Dr. Mare Ferrari. PCP is Lavone Orn  Past Medical History  Diagnosis Date  . Hypertension   . High cholesterol   . Kidney stones   . Type II diabetes mellitus with stage 4 chronic kidney disease   . GERD (gastroesophageal reflux disease)   . History of gout   . CAP (community acquired pneumonia) 01/09/2014    "first time I've ever had it"  . Diastolic congestive heart failure    Past Surgical History  Procedure Laterality Date  . Kidney stone surgery  10-05-12    "cut me on the side"  . Colonoscopy with propofol N/A 10/23/2012    Procedure: COLONOSCOPY WITH PROPOFOL;  Surgeon: [name], MD;  Location: [facility] ENDOSCOPY;  Service: Endoscopy;  Laterality: N/A;  .  Abdominal hysterectomy  ~ 1974  . Lithotripsy  1980's?  . Cataract extraction w/ intraocular lens  implant, bilateral Bilateral 2014  . Right heart catheterization N/A 04/15/2014    Procedure: RIGHT HEART CATH;  Surgeon: Sanda Klein, MD;  Location: [facility] CATH LAB;  Service: Cardiovascular;  Laterality: N/A;   Family History  Problem Relation Age of Onset  . Hypertension Mother   . CAD Neg Hx    History  Substance Use Topics  . Smoking status: Never Smoker   . Smokeless tobacco: Never Used  . Alcohol Use: No   OB History    No data available     Review of Systems  10 systems reviewed and found to be negative, except as noted in the HPI.  Allergies  Simvastatin and Codeine  Home Medications   Prior to Admission medications   Medication Sig Start Date End Date Taking? Authorizing Provider  albuterol (PROVENTIL HFA;VENTOLIN HFA) 108 (90 BASE) MCG/ACT inhaler Inhale 2 puffs into the lungs every 6 (six) hours as needed for wheezing or shortness of breath. 01/13/14   [name], MD  allopurinol (ZYLOPRIM) 100 MG tablet Take 100 mg by mouth 2 (two) times daily.    Historical Provider, MD  amLODipine (NORVASC) 10 MG tablet Take 10 mg by mouth every morning.     Historical Provider, MD  aspirin EC 81 MG tablet Take 81 mg by mouth daily.  Historical Provider, MD  bimatoprost (LUMIGAN) 0.01 % SOLN Place 1 drop into both eyes at bedtime.     Historical Provider, MD  carvedilol (COREG) 6.25 MG tablet Take 1 tablet by mouth daily. 05/21/14   Historical Provider, MD  cholecalciferol (VITAMIN D) 1000 UNITS tablet Take 2,000 Units by mouth daily.     Historical Provider, MD  Ferrous Sulfate Dried 200 (65 FE) MG TABS Take 1 tablet by mouth daily.    Historical Provider, MD  furosemide (LASIX) 40 MG tablet Take 20-40 mg by mouth 2 (two) times daily. Patient taking one (1) tablet in am and half (1/2) tablet in pm 07/17/14   Darlin Coco, MD  glimepiride (AMARYL) 1 MG tablet Take 1 mg by mouth  daily. 06/09/14   Historical Provider, MD  hydrALAZINE (APRESOLINE) 10 MG tablet Take 10 mg by mouth 2 (two) times daily.    Historical Provider, MD  isosorbide mononitrate (IMDUR) 30 MG 24 hr tablet Take 1 tablet (30 mg total) by mouth daily. 08/02/14   Janece Canterbury, MD  ondansetron (ZOFRAN ODT) 4 MG disintegrating tablet Take 1 tablet (4 mg total) by mouth every 8 (eight) hours as needed for nausea or vomiting. 04/06/14   [name], MD   BP 137/68 mmHg  Pulse 67  Temp(Src) 97.5 F (36.4 C) (Oral)  Resp 27  Wt 194 lb 4 oz (88.111 kg)  SpO2 93% Physical Exam  Constitutional: She is oriented to person, place, and time. She appears well-developed and well-nourished. No distress.  HENT:  Head: Normocephalic.  Mouth/Throat: Oropharynx is clear and moist.  Eyes: Conjunctivae and EOM are normal. Pupils are equal, round, and reactive to light.  Neck: Normal range of motion.  Cardiovascular: Normal rate, regular rhythm and intact distal pulses.   Pulmonary/Chest: Effort normal and breath sounds normal. No stridor. No respiratory distress. She has no wheezes. She has no rales. She exhibits no tenderness.  Abdominal: Soft. Bowel sounds are normal. She exhibits no distension and no mass. There is no tenderness. There is no rebound and no guarding.  Musculoskeletal: Normal range of motion. She exhibits tenderness.  3+ pitting edema to proximal shin  Neurological: She is alert and oriented to person, place, and time.  Psychiatric: She has a normal mood and affect.  Nursing note and vitals reviewed.   ED Course  Procedures (including critical care time) Labs Review Labs Reviewed  BRAIN NATRIURETIC PEPTIDE - Abnormal; Notable for the following:    B Natriuretic Peptide 646.5 (*)    All other components within normal limits  CBC WITH DIFFERENTIAL/PLATELET - Abnormal; Notable for the following:    RBC 3.17 (*)    Hemoglobin 8.9 (*)    HCT 28.0 (*)    Lymphocytes Relative 11 (*)    All  other components within normal limits  COMPREHENSIVE METABOLIC PANEL - Abnormal; Notable for the following:    BUN 39 (*)    Creatinine, Ser 2.13 (*)    Albumin 3.4 (*)    GFR calc non Af Amer 21 (*)    GFR calc Af Amer 25 (*)    All other components within normal limits  I-STAT CHEM 8, ED - Abnormal; Notable for the following:    BUN 41 (*)    Creatinine, Ser 2.10 (*)    Hemoglobin 11.2 (*)    HCT 33.0 (*)    All other components within normal limits  Randolm Idol, ED    Imaging Review Dg Chest [facility] 1 [address]  08/28/2014   CLINICAL DATA:  Congestive heart failure.  Shortness of breath.  EXAM: PORTABLE CHEST - 1 VIEW  COMPARISON:  07/29/2014.  FINDINGS: Cardiopericardial silhouette is partially obscured by effusions, edema and atelectasis. Cardiomegaly remains present. Aortic arch atherosclerosis. Interstitial and airspace opacity with basilar predominance compatible with pulmonary edema. Bilateral pleural effusions.  IMPRESSION: Recurrent moderate CHF.   Electronically Signed   By: Dereck Ligas M.D.   On: 08/28/2014 13:02     EKG Interpretation None      MDM   Final diagnoses:  CHF exacerbation    Filed Vitals:   08/28/14 1500 08/28/14 1545 08/28/14 1601 08/28/14 1615  BP: 126/62 133/59 133/59 137/68  Pulse: 63 64 67 67  Temp:      TempSrc:      Resp: 15 22 21 27   Weight:      SpO2: 97% 91% 93% 93%    Medications  nitroGLYCERIN (NITROGLYN) 2 % ointment 1 inch (1 inch Topical Given 08/28/14 1343)  furosemide (LASIX) injection 80 mg (80 mg Intravenous Given 08/28/14 1343)  furosemide (LASIX) injection 40 mg (40 mg Intravenous Given 08/28/14 1612)    Cyndie Chime Egge is a pleasant 79 y.o. female presenting with CHF exacerbation, patient has increasing weight, peripheral edema, PND. Patient is given 80 mg Lasix IV and 2% Nitropaste. EKG is nonischemic, BNP is elevated at 600, patient has a creatinine of 2.13 which is not atypical for her baseline. Discussed case  with cardiologist Dr. scans who recommends admission to [facility]. Bonnielee Haff accepts.    Monico Blitz, PA-C 08/28/14 1645  Davonna Belling, MD 08/29/14 1501

## 2014-08-28 NOTE — ED Notes (Signed)
Cardiology at bedside.

## 2014-08-29 DIAGNOSIS — N184 Chronic kidney disease, stage 4 (severe): Secondary | ICD-10-CM

## 2014-08-29 DIAGNOSIS — J441 Chronic obstructive pulmonary disease with (acute) exacerbation: Secondary | ICD-10-CM

## 2014-08-29 LAB — BASIC METABOLIC PANEL
Anion gap: 8 (ref 5–15)
BUN: 41 mg/dL — ABNORMAL HIGH (ref 6–20)
CO2: 29 mmol/L (ref 22–32)
Calcium: 9.5 mg/dL (ref 8.9–10.3)
Chloride: 104 mmol/L (ref 101–111)
Creatinine, Ser: 2.32 mg/dL — ABNORMAL HIGH (ref 0.44–1.00)
GFR calc Af Amer: 22 mL/min — ABNORMAL LOW (ref >60)
GFR calc non Af Amer: 19 mL/min — ABNORMAL LOW (ref >60)
Glucose, Bld: 105 mg/dL — ABNORMAL HIGH (ref 65–99)
Potassium: 3.5 mmol/L (ref 3.5–5.1)
Sodium: 141 mmol/L (ref 135–145)

## 2014-08-29 LAB — CBC
HCT: 26.2 % — ABNORMAL LOW (ref 36.0–46.0)
Hemoglobin: 8.4 g/dL — ABNORMAL LOW (ref 12.0–15.0)
MCH: 28.6 pg (ref 26.0–34.0)
MCHC: 32.1 g/dL (ref 30.0–36.0)
MCV: 89.1 fL (ref 78.0–100.0)
Platelets: 262 10*3/uL (ref 150–400)
RBC: 2.94 MIL/uL — ABNORMAL LOW (ref 3.87–5.11)
RDW: 15.4 % (ref 11.5–15.5)
WBC: 5.7 10*3/uL (ref 4.0–10.5)

## 2014-08-29 LAB — GLUCOSE, CAPILLARY
Glucose-Capillary: 100 mg/dL — ABNORMAL HIGH (ref 65–99)
Glucose-Capillary: 141 mg/dL — ABNORMAL HIGH (ref 65–99)
Glucose-Capillary: 152 mg/dL — ABNORMAL HIGH (ref 65–99)
Glucose-Capillary: 151 mg/dL — ABNORMAL HIGH (ref 65–99)

## 2014-08-29 LAB — URINALYSIS, ROUTINE W REFLEX MICROSCOPIC
Bilirubin Urine: NEGATIVE
Glucose, UA: NEGATIVE mg/dL
Hgb urine dipstick: NEGATIVE
Ketones, ur: NEGATIVE mg/dL
Nitrite: NEGATIVE
Protein, ur: 30 mg/dL — AB
Specific Gravity, Urine: 1.012 (ref 1.005–1.030)
Urobilinogen, UA: 0.2 mg/dL (ref 0.0–1.0)
pH: 5 (ref 5.0–8.0)

## 2014-08-29 LAB — URINE MICROSCOPIC-ADD ON

## 2014-08-29 LAB — TROPONIN I
Troponin I: 0.03 ng/mL (ref <0.031)
Troponin I: 0.03 ng/mL (ref <0.031)

## 2014-08-29 MED ORDER — ALBUTEROL SULFATE (2.5 MG/3ML) 0.083% IN NEBU
2.5000 mg | INHALATION_SOLUTION | Freq: Four times a day (QID) | RESPIRATORY_TRACT | Status: DC
  Administered 2014-08-29 (×2): 2.5 mg via RESPIRATORY_TRACT
  Filled 2014-08-29 (×2): qty 3

## 2014-08-29 MED ORDER — ALBUTEROL SULFATE (2.5 MG/3ML) 0.083% IN NEBU
2.5000 mg | INHALATION_SOLUTION | Freq: Four times a day (QID) | RESPIRATORY_TRACT | Status: DC
  Administered 2014-08-30 – 2014-08-31 (×6): 2.5 mg via RESPIRATORY_TRACT
  Filled 2014-08-29 (×6): qty 3

## 2014-08-29 MED ORDER — FUROSEMIDE 10 MG/ML IJ SOLN
40.0000 mg | Freq: Two times a day (BID) | INTRAMUSCULAR | Status: DC
  Administered 2014-08-29 – 2014-08-31 (×4): 40 mg via INTRAVENOUS
  Filled 2014-08-29 (×5): qty 4

## 2014-08-29 MED ORDER — IPRATROPIUM BROMIDE 0.02 % IN SOLN
0.5000 mg | Freq: Four times a day (QID) | RESPIRATORY_TRACT | Status: DC
  Administered 2014-08-29 (×2): 0.5 mg via RESPIRATORY_TRACT
  Filled 2014-08-29 (×2): qty 2.5

## 2014-08-29 MED ORDER — PREDNISONE 20 MG PO TABS
40.0000 mg | ORAL_TABLET | Freq: Every day | ORAL | Status: DC
  Administered 2014-08-29 – 2014-08-30 (×2): 40 mg via ORAL
  Filled 2014-08-29 (×3): qty 2

## 2014-08-29 MED ORDER — IPRATROPIUM BROMIDE 0.02 % IN SOLN
0.5000 mg | Freq: Four times a day (QID) | RESPIRATORY_TRACT | Status: DC
  Administered 2014-08-30 – 2014-08-31 (×6): 0.5 mg via RESPIRATORY_TRACT
  Filled 2014-08-29 (×6): qty 2.5

## 2014-08-29 NOTE — Progress Notes (Addendum)
Patient states her vagina is "burning sometimes" but is unable to describe exact location of burning.  Catheter care provided, after which point patient states burning has subsided.  Dr. Ree Kida notified, order placed for UA.  Dr. Ree Kida also made aware that patient has had relatively minimal urine output this shift, despite presence of indwelling catheter; no additional orders at this time.  Will collect and continue to monitor.

## 2014-08-29 NOTE — Progress Notes (Signed)
Patient very anxious and sob. MD on call notified, received one time order for Ativan, to place a foley for aggressive diuresis with respiratory distress and one time 5mg  Albuterol Neb treatment.  Orders completed, patient currently resting comfortably in bedside chair. Will continue to monitor. Doris Howell

## 2014-08-29 NOTE — Progress Notes (Signed)
Patient states that she didn't wear CPAP at home and doesn't think she will wear it while here at the hospital.  RT advised patient that if she changed her mind to give respiratory a call. RT will continue to monitor.

## 2014-08-29 NOTE — Evaluation (Signed)
Physical Therapy Evaluation Patient Details Name: Doris Howell MRN: [id number] DOB: 1935-07-29 Today's Date: 08/29/2014   History of Present Illness  79 y.o. female with a past medical history of diastolic congestive heart failure, hypertension, type 2 diabetes, who was hospitalized in April for congestive heart failure. She underwent a right heart catheterization in January. Currently presenting with increased SOB and dyspnea on exertion likely acute on chronic CHF.  Clinical Impression  Pt presents with the below listed impairments and will benefit from skilled PT services to increase functional independence and overall activity tolerance before d/c home. Pt's husband lives with her, but is blind and unable to provide assistance. Pt is overall steady to min A for functional mobility and will continue to benefit from increase OOB and mobility to increase strength and endurance. Recommend AD for energy conservation and pt will need reinforcement. Using supplemental O2 at this time (3L) with sats 88-91% with activity.    Follow Up Recommendations Home health PT;Supervision - Intermittent    Equipment Recommendations  Other (comment) (May benefit from rollator for energy conservation)    Recommendations for Other Services       Precautions / Restrictions Restrictions Weight Bearing Restrictions: No      Mobility  Bed Mobility               General bed mobility comments: Pt up in recliner already. Pt reports at home she sleeps with multiple pillows to elevate her head due to SOB.  Transfers Overall transfer level: Needs assistance Equipment used: Rolling walker (2 wheeled) Transfers: Sit to/from Stand Sit to Stand: Min assist         General transfer comment: min A with RW with cues for hand placement and technique. Cues to maintain upright posture and pursed lip breathing.  Ambulation/Gait Ambulation/Gait assistance: Min guard Ambulation Distance (Feet): 100  Feet Assistive device: Rolling walker (2 wheeled) Gait Pattern/deviations: Step-through pattern;Trunk flexed   Gait velocity interpretation: Below normal speed for age/gender General Gait Details: Pt with slow cadence, cues for upright posture and pursed lip breathing as dyspnea increases with activity. Pt reports she would not feel comfortable not using the RW at this time for gait but feels like she would not use it at home. Educated pt on use of RW or AD for energy conservation at this time due to increased work of breathing.   Stairs            Wheelchair Mobility    Modified Rankin (Stroke Patients Only)       Balance Overall balance assessment: Needs assistance Sitting-balance support: Feet supported;Single extremity supported;No upper extremity supported Sitting balance-Leahy Scale: Good     Standing balance support: Single extremity supported;Bilateral upper extremity supported;During functional activity Standing balance-Leahy Scale: Poor Standing balance comment: Pt required 1 UE support for balance when donning and doffing gown with steady A                             Pertinent Vitals/Pain Pain Assessment: No/denies pain    Home Living Family/patient expects to be discharged to:: Private residence Living Arrangements: Spouse/significant other Available Help at Discharge: Other (Comment) (Pt's husband is blind and unable to provide A. ) Type of Home: House Home Access: Stairs to enter Entrance Stairs-Rails: None Entrance Stairs-Number of Steps: 1 Home Layout: One level Home Equipment: Cane - quad;Toilet riser;Walker - 2 wheels Additional Comments: Pt reports husband is blind and unable to  provide assist. She states her daughter lives nearby but is fighting cancer and unable to assist    Prior Function Level of Independence: Independent with assistive device(s)         Comments: Pt reports she was independent PTA but having more difficulty  with things due to SOB     Hand Dominance   Dominant Hand: Right    Extremity/Trunk Assessment   Upper Extremity Assessment: Defer to OT evaluation           Lower Extremity Assessment: Generalized weakness      Cervical / Trunk Assessment: Kyphotic  Communication   Communication: No difficulties  Cognition Arousal/Alertness: Awake/alert Behavior During Therapy: Flat affect;WFL for tasks assessed/performed Overall Cognitive Status: Within Functional Limits for tasks assessed                      General Comments General comments (skin integrity, edema, etc.): edema noted in BLE; educated on elevation and ankle pumps    Exercises        Assessment/Plan    PT Assessment Patient needs continued PT services  PT Diagnosis Difficulty walking;Generalized weakness   PT Problem List Decreased strength;Decreased activity tolerance;Decreased balance;Decreased mobility;Decreased knowledge of use of DME;Decreased safety awareness;Cardiopulmonary status limiting activity  PT Treatment Interventions DME instruction;Gait training;Stair training;Functional mobility training;Therapeutic activities;Therapeutic exercise;Balance training;Neuromuscular re-education;Patient/family education   PT Goals (Current goals can be found in the Care Plan section) Acute Rehab PT Goals Patient Stated Goal: go home PT Goal Formulation: With patient Time For Goal Achievement: 09/12/14 Potential to Achieve Goals: Good    Frequency Min 3X/week   Barriers to discharge Decreased caregiver support Pt's husband is unable to provide A per pt report due to being blind. Pt denies any other family able to provide intermittent assist but prefers to go home. Feel that she would benefit from intermittent S and recommend HHPT and OT    Co-evaluation               End of Session Equipment Utilized During Treatment: Gait belt;Oxygen Activity Tolerance: Patient tolerated treatment well Patient  left: in chair;with call bell/phone within reach Nurse Communication: Mobility status         Time: 0815-0850 PT Time Calculation (min) (ACUTE ONLY): 35 min   Charges:   PT Evaluation $Initial PT Evaluation Tier I: 1 Procedure PT Treatments $Gait Training: 8-22 mins $Therapeutic Activity: 8-22 mins   PT G Codes:        Canary Brim Ivory Broad, PT, DPT [phone] 08/29/2014, 9:05 AM

## 2014-08-29 NOTE — Care Management Note (Signed)
Case Management Note  Patient Details  Name: Doris Howell MRN: [id number] Date of Birth: 1935-04-13  Subjective/Objective:     Admitted with Acute CHF               Action/Plan: CM following for DCP; patient was Active with [facility] until March, possibly needs it again - Disease Management Program for CHF;   Expected Discharge Date:  09/01/14               Expected Discharge Plan:   Home with [facility] services    If discussed at [place] of Stay Meetings, dates discussed:    Additional Comments:  Royston Bake, [place], [place] 08/29/2014, 1:05 PM

## 2014-08-29 NOTE — Progress Notes (Signed)
Patient stated earlier that she would try CPAP but did not want to go on at that time and would like a few more minutes. RT came again and attempted to see if pt would like to go on CPAP now. Pt refused. RT made pt aware that if she changes her mind to call. Vitals stable.

## 2014-08-29 NOTE — Progress Notes (Signed)
Patient Name: Doris Howell Date of Encounter: 08/29/2014     Principal Problem:   Acute on chronic combined systolic and diastolic CHF (congestive heart failure) Active Problems:   Chronic diastolic heart failure   Type 2 diabetes mellitus with stage 4 chronic kidney disease   SOB (shortness of breath)   Bradycardia- beta blocker decreased   Sleep apnea   Acute exacerbation of CHF (congestive heart failure)   Acute diastolic CHF (congestive heart failure)   Chronic kidney disease (CKD), stage IV (severe)    SUBJECTIVE  Says she's feeling a little better today.  States she was able to do a little more walking before becoming short of breath than before she came into the hospital.  Per daughter in the room she looks about the same as the past few days energy wise.    CURRENT MEDS . allopurinol  100 mg Oral BID  . amLODipine  10 mg Oral q morning - 10a  . aspirin EC  81 mg Oral Daily  . carvedilol  12.5 mg Oral BID WC  . furosemide  80 mg Intravenous Q12H  . heparin  5,000 Units Subcutaneous 3 times per day  . hydrALAZINE  10 mg Oral BID  . insulin aspart  0-9 Units Subcutaneous TID WC  . latanoprost  1 drop Both Eyes QHS  . nitroGLYCERIN  0.5 inch Topical 4 times per day  . sodium chloride  3 mL Intravenous Q12H   OBJECTIVE  Filed Vitals:   08/28/14 2103 08/28/14 2139 08/29/14 0240 08/29/14 0956  BP:  129/65 130/61 128/60  Pulse:  65 63   Temp:  97.4 F (36.3 C) 97.5 F (36.4 C)   TempSrc:  Oral Axillary   Resp:  28 22   Height:      Weight: 191 lb 2.2 oz (86.7 kg)     SpO2:  99% 99%     Intake/Output Summary (Last 24 hours) at 08/29/14 1011 Last data filed at 08/29/14 0300  Gross per 24 hour  Intake    360 ml  Output    500 ml  Net   -140 ml   Filed Weights   08/28/14 1148 08/28/14 1723 08/28/14 2103  Weight: 194 lb 4 oz (88.111 kg) 430 lb 1.9 oz (195.1 kg) 191 lb 2.2 oz (86.7 kg)    PHYSICAL EXAM  General: Very Pleasant, NAD. Neuro: Alert  and oriented X 3. Moves all extremities spontaneously. Psych: Normal affect. HEENT:  Normal  Neck: Supple without bruits or JVD. Lungs:  Resp regular. Slightly labored with rales up into mid lungs bilaterally. Heart: RRR no s3, s4, or murmurs appreciated.  Abdomen: Soft, non-tender, non-distended, BS + x 4.  Extremities: No clubbing or cyanosis. DP/PT/Radials 2+ and equal bilaterally. 0-1+ pitting bilaterally.  Accessory Clinical Findings  CBC  Recent Labs  08/28/14 1204 08/28/14 1235 08/29/14 0531  WBC 6.4  --  5.7  NEUTROABS 4.7  --   --   HGB 8.9* 11.2* 8.4*  HCT 28.0* 33.0* 26.2*  MCV 88.3  --  89.1  PLT 279  --  99991111   Basic Metabolic Panel  Recent Labs  08/28/14 1204 08/28/14 1235 08/29/14 0531  NA 142 142 141  K 3.6 3.6 3.5  CL 105 103 104  CO2 25  --  29  GLUCOSE 92 93 105*  BUN 39* 41* 41*  CREATININE 2.13* 2.10* 2.32*  CALCIUM 9.5  --  9.5   Liver Function Tests  Recent  Labs  08/28/14 1204  AST 31  ALT 23  ALKPHOS 101  BILITOT 0.6  PROT 7.0  ALBUMIN 3.4*   Cardiac Enzymes  Recent Labs  08/28/14 2000 08/29/14 0001 08/29/14 0531  TROPONINI 0.03 0.03 0.03    TELE  Sinus Brady 50s, with some bpm up into 60s.    ECG 08/28/14 NSR. Right axis deviation. Non specific intraventicular conduction block. Apparent ST depression in V5-V6.    Echocardiogram  07/31/2014  Study Conclusions  - Left ventricle: The cavity size was normal. There was moderate concentric hypertrophy. Systolic function was mildly reduced. The estimated ejection fraction was in the range of 45% to 50%. Diffuse hypokinesis. Features are consistent with a pseudonormal left ventricular filling pattern, with concomitant abnormal relaxation and increased filling pressure (grade 2 diastolic dysfunction). Doppler parameters are consistent with elevated ventricular end-diastolic filling pressure. - Mitral valve: There was mild regurgitation. - Left atrium: The  atrium was mildly dilated. - Right ventricle: The cavity size was normal. Wall thickness was normal. Systolic function was normal. - Right atrium: The atrium was normal in size. - Tricuspid valve: There was no regurgitation. - Pulmonary arteries: Systolic pressure was within the normal range. - Inferior vena cava: The vessel was normal in size. - Pericardium, extracardiac: A trivial pericardial effusion was identified posterior to the heart. Features were not consistent with tamponade physiology.    Radiology/Studies  Nm Pulmonary Perf And Vent  07/30/2014   CLINICAL DATA:  79 year old female with 2 day history of shortness of breath  EXAM: NUCLEAR MEDICINE VENTILATION - PERFUSION LUNG SCAN  TECHNIQUE: Ventilation images were obtained in multiple projections using inhaled aerosol technetium 99 M DTPA. Perfusion images were obtained in multiple projections after intravenous injection of Tc-52m MAA.  RADIOPHARMACEUTICALS:  40 mCi Tc-40m DTPA aerosol and 6 mCi Tc-20m MAA  COMPARISON:  Prior nuclear medicine V/Q scan 02/07/2014  FINDINGS: Ventilation: No focal ventilation defect.  Perfusion: No wedge shaped peripheral perfusion defects to suggest acute pulmonary embolism.  IMPRESSION: Negative for evidence of acute pulmonary embolus.  Signed,  Criselda Peaches, MD  [facility]  [facility]   Electronically Signed   By: Jacqulynn Cadet M.D.   On: 07/30/2014 15:42   Dg Chest Port 1 View  08/28/2014   CLINICAL DATA:  Congestive heart failure.  Shortness of breath.  EXAM: PORTABLE CHEST - 1 VIEW  COMPARISON:  07/29/2014.  FINDINGS: Cardiopericardial silhouette is partially obscured by effusions, edema and atelectasis. Cardiomegaly remains present. Aortic arch atherosclerosis. Interstitial and airspace opacity with basilar predominance compatible with pulmonary edema. Bilateral pleural effusions.  IMPRESSION: Recurrent moderate CHF.    Electronically Signed   By: Dereck Ligas M.D.   On: 08/28/2014 13:02    ASSESSMENT AND PLAN  1. Acute on chronic combined systolic and diastolic CHF (congestive heart failure)   - EF of 45% to 50% by Echo on 07/31/14.  - On Lasix 80 BID  - Serial Trop Negative  - Cr 2.10 > 2.32 today, consider backing off lasix  - Only -140 I/Os per Nurse records.  - Still fluid overloaded, especially in lungs.   2. CKD  - Cr bump. As above.  3. DM  - Per Primary team   Signed, Almyra Deforest PA-C Pager: [phone]   79 year old female with acute on chronic systolic CHF and acute on CKD, diuresed overnight with mild symptoms improvement, now worsening Crea 2.1 --> 2.3. We will decrease lasix to 40 mg  iv BID. She is wheezing, there might be a component of acute COPD exacerbation, she is not a smoker but life long second hand smoking, I will start a short course of steroids and breathing treatment.   Dorothy Spark 08/29/2014

## 2014-08-29 NOTE — Progress Notes (Signed)
Occupational Therapy Evaluation Patient Details Name: Doris Howell MRN: [id number] DOB: [birth year] Today's Date: 08/29/2014    History of Present Illness 79 y.o. female with a past medical history of diastolic congestive heart failure, hypertension, type 2 diabetes, who was hospitalized in April for congestive heart failure. She underwent a right heart catheterization in January. Currently presenting with increased SOB and dyspnea on exertion likely acute on chronic CHF.   Clinical Impression   Patient independent PTA. Patient currently functioning at an overall min assist level, requiring up to mod assist verbal cues for overall safety. Patient will benefit from acute OT to increase overall independence in the areas of ADLs, functional mobility, and overall safety in order to safely discharge home. Pt lives with husband who is blind and unable to provide assistance. Pt's daughter lives close by and checks in on them via phone, per her report. At this time, recommending 24/7 supervision due to safety concerns. Pt using RW at this time and needs oxygen (using 2L during this session), see below under ADL comment for more information regarding 02 readings.      Follow Up Recommendations  Home health OT;Supervision/Assistance - 24 hour    Equipment Recommendations  None recommended by OT    Recommendations for Other Services  None at this time   Precautions / Restrictions Precautions Precautions: Fall Restrictions Weight Bearing Restrictions: No      Mobility Bed Mobility General bed mobility comments: Pt up in recliner already. Pt reports at home she sleeps with multiple pillows to elevate her head due to SOB.  Transfers Overall transfer level: Needs assistance Equipment used: Rolling walker (2 wheeled) Transfers: Sit to/from Stand Sit to Stand: Min guard General transfer comment: Cues for technique, safety, hand placement. Multiple cues needed for pursed lip breathing and  overall safety.     Balance Overall balance assessment: Needs assistance Sitting-balance support: No upper extremity supported;Feet supported Sitting balance-Leahy Scale: Good     Standing balance support: Bilateral upper extremity supported;During functional activity Standing balance-Leahy Scale: Fair    ADL Overall ADL's : Needs assistance/impaired   General ADL Comments: Pt with overall decreased activity tolerance/endurance. Pt ambulated > sink using RW and performed grooming task of brushing teeth in sit<>stand position. Pt unable to tolerate standing more than ~3 minutes before she experienced SOB and needed to sit. Therapist placed BSC behind pt as an energy conservation tool. Educated pt on energy conservation and importance of taking breaks prn and performing pursed lip breathing. Pt's 02 sats on RA during activity got as low as 85%, encouraged pursed lip breathing & seated rest break; sats increased > 89%. Only way sats increased to 90% or above is with 2 liters of 02 via [place]. Pt's daughter present and supportive, but stated she is unable to physically assist due to her own health problems. Recommending HHOT post acute discharge.     Pertinent Vitals/Pain Pain Assessment: No/denies pain     Hand Dominance Right   Extremity/Trunk Assessment Upper Extremity Assessment Upper Extremity Assessment: Overall WFL for tasks assessed   Lower Extremity Assessment Lower Extremity Assessment: Defer to PT evaluation   Cervical / Trunk Assessment Cervical / Trunk Assessment: Kyphotic   Communication Communication Communication: No difficulties   Cognition Arousal/Alertness: Awake/alert Behavior During Therapy: Flat affect;WFL for tasks assessed/performed Overall Cognitive Status: Within Functional Limits for tasks assessed              Home Living Family/patient expects to be discharged to:: Private residence  Living Arrangements: Spouse/significant other Available Help at  Discharge: Other (Comment) (Pt's husband is blind and unable to provide physical assistance) Type of Home: House Home Access: Stairs to enter [employer] of Steps: 1 Entrance Stairs-Rails: None Home Layout: One level Bathroom Shower/Tub: Tub/shower unit;Door   [employer] Toilet: Standard     Home Equipment: Latina Craver - 2 wheels;Bedside commode   Additional Comments: Pt reports husband is blind and unable to provide assist. She states her daughter lives nearby but is fighting cancer and unable to assist      Prior Functioning/Environment Level of Independence: Independent with assistive device(s)  Comments: Pt reports she was independent PTA but having more difficulty with things due to SOB    OT Diagnosis: Generalized weakness   OT Problem List: Decreased strength;Decreased activity tolerance;Impaired balance (sitting and/or standing);Decreased safety awareness;Decreased knowledge of use of DME or AE   OT Treatment/Interventions: Self-care/ADL training;Therapeutic exercise;Energy conservation;DME and/or AE instruction;Therapeutic activities;Patient/family education;Balance training    OT Goals(Current goals can be found in the care plan section) Acute Rehab OT Goals Patient Stated Goal: go home OT Goal Formulation: With patient/family Time For Goal Achievement: 09/05/14 Potential to Achieve Goals: Good ADL Goals Pt Will Perform Grooming: with modified independence;standing Pt Will Perform Lower Body Bathing: with modified independence;sit to/from stand Pt Will Perform Lower Body Dressing: with modified independence;sit to/from stand Pt Will Transfer to Toilet: with modified independence;ambulating;bedside commode Pt Will Perform Toileting - Clothing Manipulation and hygiene: with modified independence;sit to/from stand Pt Will Perform Tub/Shower Transfer: with supervision;rolling walker;ambulating;3 in 1;Tub transfer Additional ADL Goal #1: Patient will be  educated on energy conservation techniques to help increase independence and safety with ADLs and IADLs.   OT Frequency: Min 2X/week   Barriers to D/C: Decreased caregiver support   End of Session Equipment Utilized During Treatment: Rolling walker;Oxygen (2 liters)  Activity Tolerance: Patient tolerated treatment well Patient left: in chair;with call bell/phone within reach;with family/visitor present   Time: [id number] OT Time Calculation (min): 33 min Charges:  OT General Charges $OT Visit: 1 Procedure OT Evaluation $Initial OT Evaluation Tier I: 1 Procedure OT Treatments $Self Care/Home Management : 8-22 mins  Tranquilino Fischler , MS, OTR/L, CLT Pager: [phone]  08/29/2014, 2:10 PM

## 2014-08-29 NOTE — Progress Notes (Signed)
[facility]                                                                              Patient Demographics  Doris Howell, is a 79 y.o. female, DOB - [birth year], [id number]  Admit date - 08/28/2014   Admitting Physician Bonnielee Haff, MD  Outpatient Primary MD for the patient is Irven Shelling, MD  LOS - 1   Chief Complaint  Patient presents with  . Congestive Heart Failure  . Shortness of Breath      HPI on 08/28/2014 by Dr. Bonnielee Haff Doris Howell is a 79 y.o. female with a past medical history of diastolic congestive heart failure, hypertension, type 2 diabetes, who was hospitalized in April for congestive heart failure. She had a follow-up appointment with her cardiologist on May 3. She underwent a right heart catheterization in January and was found to have a pulmonary artery pressure of 64/25 with 40. She was thought to have biventricular heart failure with preserved left and ejection fraction. She also underwent a lexican myoview in January which showed left ventricle systolic dysfunction with EF of 44% without any ischemia.  Patient tells me that after her last hospitalization she was actually doing well. She was checking her weight on a daily basis. She was adhering to her dietary restrictions. She does not consume any salt. She was compliant with her medications. Recently her primary care physician increased the dose of Lasix. However, Wednesday of last week she started developing shortness of breath mainly with exertion. She also had symptoms suggestive of PND. She uses 2 pillows to sleep and has an increased number of pillows in the last few days. She has noticed leg swelling and her weight went up from 181 pounds to 184 pounds. Denies any nausea, vomiting. No chest pain. No fever, no chills. She has been extremely fatigued over the last few days. She tells me that she has been hospitalized 4-5 times during this year for similar problems. She came  in because her symptoms were not improving.  Assessment & Plan   Acute diastolic congestive heart failure -Echocardiogram in April 2016 shows EF [id number], grade 2 diastolic dysfunction -Monitor intake and output, daily weights -No ACE inhibitor due to chronic kidney disease -Patient currently on IV Lasix -Cardiology consult appreciated -Down 3 pounds since admission -Not sure if output is documented appropriately  Chronic kidney disease, stage IV -Monitor creatinine intake and output closely due to diuresis -Baseline creatinine appears to be 2.2, currently 2.3  Diabetes mellitus, type II -October 2015 hemoglobin A1c 2.5 -Repeat hemoglobin A1c pending -Continue insulin sliding-scale CBG monitoring  Obstructive sleep apnea -Continue CPAP  Hypertension -Continue diuresis and amlodipine  Gout -Continue allopurinol  Normocytic anemia -Baseline hemoglobin appears to be approximately 8, currently 8.4 -Continue to monitor CBC  Code Status: Full  Family Communication: None at bedside  Disposition Plan: Admitted  Time Spent in minutes   30 minutes  Procedures  None  Consults   Cardiology  DVT Prophylaxis  heparin  Lab Results  Component Value Date   PLT 262 08/29/2014    Medications  Scheduled Meds: . allopurinol  100 mg Oral BID  . amLODipine  10 mg Oral q morning - 10a  . aspirin EC  81 mg Oral Daily  . carvedilol  12.5 mg Oral BID WC  . furosemide  80 mg Intravenous Q12H  . heparin  5,000 Units Subcutaneous 3 times per day  . hydrALAZINE  10 mg Oral BID  . insulin aspart  0-9 Units Subcutaneous TID WC  . latanoprost  1 drop Both Eyes QHS  . nitroGLYCERIN  0.5 inch Topical 4 times per day  . sodium chloride  3 mL Intravenous Q12H   Continuous Infusions:  PRN Meds:.sodium chloride, acetaminophen, albuterol, ondansetron (ZOFRAN) IV, sodium chloride  Antibiotics    Anti-infectives    None      Subjective:   Doris Howell seen and examined  today.  Patient feels her breathing has improved slightly. She has more shortness of breath with ambulation and movement. Patient denies any chest pain or abdominal pain, dizziness or headache at this time.  Patient also had some ankle swelling on admission, feels this has improved.  Objective:   Filed Vitals:   08/28/14 2103 08/28/14 2139 08/29/14 0240 08/29/14 0956  BP:  129/65 130/61 128/60  Pulse:  65 63   Temp:  97.4 F (36.3 C) 97.5 F (36.4 C)   TempSrc:  Oral Axillary   Resp:  28 22   Height:      Weight: 86.7 kg (191 lb 2.2 oz)     SpO2:  99% 99%     Wt Readings from Last 3 Encounters:  08/28/14 86.7 kg (191 lb 2.2 oz)  08/12/14 85.095 kg (187 lb 9.6 oz)  08/02/14 83.598 kg (184 lb 4.8 oz)     Intake/Output Summary (Last 24 hours) at 08/29/14 1301 Last data filed at 08/29/14 1030  Gross per 24 hour  Intake    600 ml  Output    750 ml  Net   -150 ml    Exam  General: Well developed, well nourished, NAD, appears stated age  HEENT: NCAT, mucous membranes moist.   Cardiovascular: S1 S2 auscultated, no rubs, murmurs or gallops. Regular rate and rhythm.  Respiratory: Diminished however clear. No rhonchi or rales noted  Abdomen: Soft, nontender, nondistended, + bowel sounds  Extremities: warm dry without cyanosis clubbing. Trace lower extremity edema bilaterally  Neuro: AAOx3, nonfocal  Psych: Normal affect and demeanor with intact judgement and insight  Data Review   Micro Results No results found for this or any previous visit (from the past 240 hour(s)).  Radiology Reports Nm Pulmonary Perf And Vent  07/30/2014   CLINICAL DATA:  79 year old female with 2 day history of shortness of breath  EXAM: NUCLEAR MEDICINE VENTILATION - PERFUSION LUNG SCAN  TECHNIQUE: Ventilation images were obtained in multiple projections using inhaled aerosol technetium 99 M DTPA. Perfusion images were obtained in multiple projections after intravenous injection of Tc-45m MAA.   RADIOPHARMACEUTICALS:  40 mCi Tc-16m DTPA aerosol and 6 mCi Tc-78m MAA  COMPARISON:  Prior nuclear medicine V/Q scan 02/07/2014  FINDINGS: Ventilation: No focal ventilation defect.  Perfusion: No wedge shaped peripheral perfusion defects to suggest acute pulmonary embolism.  IMPRESSION: Negative for evidence of acute pulmonary embolus.  Signed,  Criselda Peaches, MD  [facility]  [facility]   Electronically Signed   By: Jacqulynn Cadet M.D.   On: 07/30/2014 15:42   Dg Chest Port 1 View  08/28/2014   CLINICAL DATA:  Congestive heart failure.  Shortness of breath.  EXAM: PORTABLE CHEST -  1 VIEW  COMPARISON:  07/29/2014.  FINDINGS: Cardiopericardial silhouette is partially obscured by effusions, edema and atelectasis. Cardiomegaly remains present. Aortic arch atherosclerosis. Interstitial and airspace opacity with basilar predominance compatible with pulmonary edema. Bilateral pleural effusions.  IMPRESSION: Recurrent moderate CHF.   Electronically Signed   By: Dereck Ligas M.D.   On: 08/28/2014 13:02    CBC  Recent Labs Lab 08/28/14 1204 08/28/14 1235 08/29/14 0531  WBC 6.4  --  5.7  HGB 8.9* 11.2* 8.4*  HCT 28.0* 33.0* 26.2*  PLT 279  --  262  MCV 88.3  --  89.1  MCH 28.1  --  28.6  MCHC 31.8  --  32.1  RDW 15.4  --  15.4  LYMPHSABS 0.7  --   --   MONOABS 0.7  --   --   EOSABS 0.2  --   --   BASOSABS 0.0  --   --     Chemistries   Recent Labs Lab 08/28/14 1204 08/28/14 1235 08/29/14 0531  NA 142 142 141  K 3.6 3.6 3.5  CL 105 103 104  CO2 25  --  29  GLUCOSE 92 93 105*  BUN 39* 41* 41*  CREATININE 2.13* 2.10* 2.32*  CALCIUM 9.5  --  9.5  AST 31  --   --   ALT 23  --   --   ALKPHOS 101  --   --   BILITOT 0.6  --   --    ------------------------------------------------------------------------------------------------------------------ estimated creatinine clearance is 20.3 mL/min (by C-G formula based on Cr of  2.32). ------------------------------------------------------------------------------------------------------------------ No results for input(s): HGBA1C in the last 72 hours. ------------------------------------------------------------------------------------------------------------------ No results for input(s): CHOL, HDL, LDLCALC, TRIG, CHOLHDL, LDLDIRECT in the last 72 hours. ------------------------------------------------------------------------------------------------------------------ No results for input(s): TSH, T4TOTAL, T3FREE, THYROIDAB in the last 72 hours.  Invalid input(s): FREET3 ------------------------------------------------------------------------------------------------------------------ No results for input(s): VITAMINB12, FOLATE, FERRITIN, TIBC, IRON, RETICCTPCT in the last 72 hours.  Coagulation profile No results for input(s): INR, PROTIME in the last 168 hours.  No results for input(s): DDIMER in the last 72 hours.  Cardiac Enzymes  Recent Labs Lab 08/28/14 2000 08/29/14 0001 08/29/14 0531  TROPONINI 0.03 0.03 0.03   ------------------------------------------------------------------------------------------------------------------ Invalid input(s): POCBNP    Letanya Froh D.O. on 08/29/2014 at 1:01 PM  Between 7am to 7pm - Pager - [phone]  After 7pm go to [email or web address] - password [email or web address]  And look for the night coverage person covering for me after hours  [facility] Office  [phone]

## 2014-08-30 DIAGNOSIS — N179 Acute kidney failure, unspecified: Secondary | ICD-10-CM

## 2014-08-30 DIAGNOSIS — N189 Chronic kidney disease, unspecified: Secondary | ICD-10-CM

## 2014-08-30 DIAGNOSIS — E1122 Type 2 diabetes mellitus with diabetic chronic kidney disease: Secondary | ICD-10-CM

## 2014-08-30 LAB — GLUCOSE, CAPILLARY
Glucose-Capillary: 171 mg/dL — ABNORMAL HIGH (ref 65–99)
Glucose-Capillary: 220 mg/dL — ABNORMAL HIGH (ref 65–99)
Glucose-Capillary: 220 mg/dL — ABNORMAL HIGH (ref 65–99)
Glucose-Capillary: 154 mg/dL — ABNORMAL HIGH (ref 65–99)

## 2014-08-30 LAB — BASIC METABOLIC PANEL
Anion gap: 13 (ref 5–15)
BUN: 46 mg/dL — ABNORMAL HIGH (ref 6–20)
CO2: 26 mmol/L (ref 22–32)
Calcium: 9.3 mg/dL (ref 8.9–10.3)
Chloride: 103 mmol/L (ref 101–111)
Creatinine, Ser: 2.55 mg/dL — ABNORMAL HIGH (ref 0.44–1.00)
GFR calc Af Amer: 20 mL/min — ABNORMAL LOW (ref >60)
GFR calc non Af Amer: 17 mL/min — ABNORMAL LOW (ref >60)
Glucose, Bld: 196 mg/dL — ABNORMAL HIGH (ref 65–99)
Potassium: 3.8 mmol/L (ref 3.5–5.1)
Sodium: 142 mmol/L (ref 135–145)

## 2014-08-30 LAB — CBC
HCT: 26.4 % — ABNORMAL LOW (ref 36.0–46.0)
Hemoglobin: 8.8 g/dL — ABNORMAL LOW (ref 12.0–15.0)
MCH: 29.7 pg (ref 26.0–34.0)
MCHC: 33.3 g/dL (ref 30.0–36.0)
MCV: 89.2 fL (ref 78.0–100.0)
Platelets: 241 10*3/uL (ref 150–400)
RBC: 2.96 MIL/uL — ABNORMAL LOW (ref 3.87–5.11)
RDW: 15.5 % (ref 11.5–15.5)
WBC: 5.6 10*3/uL (ref 4.0–10.5)

## 2014-08-30 LAB — HEMOGLOBIN A1C
Hgb A1c MFr Bld: 6.6 % — ABNORMAL HIGH (ref 4.8–5.6)
Mean Plasma Glucose: 143 mg/dL

## 2014-08-30 MED ORDER — DOCUSATE SODIUM 100 MG PO CAPS
100.0000 mg | ORAL_CAPSULE | Freq: Two times a day (BID) | ORAL | Status: DC | PRN
  Administered 2014-08-30 – 2014-09-08 (×6): 100 mg via ORAL
  Filled 2014-08-30 (×8): qty 1

## 2014-08-30 MED ORDER — PREDNISONE 20 MG PO TABS
20.0000 mg | ORAL_TABLET | Freq: Every day | ORAL | Status: AC
  Administered 2014-08-31: 20 mg via ORAL
  Filled 2014-08-30: qty 1

## 2014-08-30 NOTE — Progress Notes (Signed)
RT Note: Pt refuses cpap at this time.

## 2014-08-30 NOTE — Progress Notes (Signed)
SUBJECTIVE: The patient is doing well today.   She is tired.  SOB is a little better.  Edema is much better.  At this time, she denies chest pain,   or any new concerns.  [name] albuterol  2.5 mg Nebulization QID  . allopurinol  100 mg Oral BID  . amLODipine  10 mg Oral q morning - 10a  . aspirin EC  81 mg Oral Daily  . carvedilol  12.5 mg Oral BID WC  . furosemide  40 mg Intravenous Q12H  . heparin  5,000 Units Subcutaneous 3 times per day  . hydrALAZINE  10 mg Oral BID  . insulin aspart  0-9 Units Subcutaneous TID WC  . ipratropium  0.5 mg Nebulization QID  . latanoprost  1 drop Both Eyes QHS  . nitroGLYCERIN  0.5 inch Topical 4 times per day  . [START ON 08/31/2014] predniSONE  20 mg Oral Q breakfast  . sodium chloride  3 mL Intravenous Q12H      OBJECTIVE: Physical Exam: Filed Vitals:   08/29/14 2201 08/30/14 0752 08/30/14 0803 08/30/14 0856  BP: 143/65 140/61 136/66   Pulse: 63 62 75   Temp: 97.4 F (36.3 C) 98 F (36.7 C)    TempSrc: Oral Oral    Resp: 18 18    Height:      Weight:      SpO2: 94% 95%  93%    Intake/Output Summary (Last 24 hours) at 08/30/14 1342 Last data filed at 08/30/14 1259  Gross per 24 hour  Intake   1200 ml  Output   1000 ml  Net    200 ml    Telemetry reveals sinus rhythm  GEN- The patient is chronically ill and overweight appearing, alert and oriented x 3 today.   Head- normocephalic, atraumatic Eyes-  Sclera clear, conjunctiva pink Ears- hearing intact Oropharynx- clear Neck- supple, + JVD Lungs- bibasilar rales, normal work of breathing Heart- Regular rate and rhythm  GI- soft, NT, ND, + BS Extremities- no clubbing, cyanosis, very little edema edema Skin- no rash or lesion Psych- euthymic mood, full affect Neuro- strength and sensation are intact  LABS: Basic Metabolic Panel:  Recent Labs  08/29/14 0531 08/30/14 0320  NA 141 142  K 3.5 3.8  CL 104 103  CO2 29 26  GLUCOSE 105* 196*  BUN 41* 46*  CREATININE 2.32*  2.55*  CALCIUM 9.5 9.3   Liver Function Tests:  Recent Labs  08/28/14 1204  AST 31  ALT 23  ALKPHOS 101  BILITOT 0.6  PROT 7.0  ALBUMIN 3.4*   No results for input(s): LIPASE, AMYLASE in the last 72 hours. CBC:  Recent Labs  08/28/14 1204  08/29/14 0531 08/30/14 0320  WBC 6.4  --  5.7 5.6  NEUTROABS 4.7  --   --   --   HGB 8.9*  < > 8.4* 8.8*  HCT 28.0*  < > 26.2* 26.4*  MCV 88.3  --  89.1 89.2  PLT 279  --  262 241  < > = values in this interval not displayed. Cardiac Enzymes:  Recent Labs  08/28/14 2000 08/29/14 0001 08/29/14 0531  TROPONINI 0.03 0.03 0.03   BNP: Invalid input(s): POCBNP D-Dimer: No results for input(s): DDIMER in the last 72 hours. Hemoglobin A1C:  ASSESSMENT AND PLAN:  Principal Problem:   Acute on chronic combined systolic and diastolic CHF (congestive heart failure) Active Problems:   Type 2 diabetes mellitus with stage 4 chronic  kidney disease   SOB (shortness of breath)   Bradycardia- beta blocker decreased   Sleep apnea   Chronic kidney disease (CKD), stage IV (severe)   COPD with exacerbation  1. Acute on chronic combined systolic diastolic chf Remains volume overloaded but is certainly improving Will continue diuresis for now though renal function has worsened Will reduce prednisone to 20mg  daily as this certainly contributes to fluid retention--> no wheezing on exam Will ask IM to help [place] wean the prednisone to off as they are able  2. Acute on chronic renal failure Renal worsening is worrisome Continue IV diuresis for now given volume overload  3. Morbid obesity Weight reduction is advised  4. HTN Stable No change required today  5. Diabetes Worsened by steroids IM to wean steroids as above  6. OSA Should use cpap but has not purchased her unit yet Compliance is advised Will order CPAP while here   Thompson Grayer, MD 08/30/2014 1:42 PM

## 2014-08-30 NOTE — Progress Notes (Signed)
[facility]                                                                              Patient Demographics  Doris Howell, is a 79 y.o. female, DOB - 1935/08/10, [id number]  Admit date - 08/28/2014   Admitting Physician Bonnielee Haff, MD  Outpatient Primary MD for the patient is Irven Shelling, MD  LOS - 2   Chief Complaint  Patient presents with  . Congestive Heart Failure  . Shortness of Breath      HPI on 08/28/2014 by Dr. Bonnielee Haff Doris Howell is a 79 y.o. female with a past medical history of diastolic congestive heart failure, hypertension, type 2 diabetes, who was hospitalized in April for congestive heart failure. She had a follow-up appointment with her cardiologist on May 3. She underwent a right heart catheterization in January and was found to have a pulmonary artery pressure of 64/25 with 40. She was thought to have biventricular heart failure with preserved left and ejection fraction. She also underwent a lexican myoview in January which showed left ventricle systolic dysfunction with EF of 44% without any ischemia.  Patient tells me that after her last hospitalization she was actually doing well. She was checking her weight on a daily basis. She was adhering to her dietary restrictions. She does not consume any salt. She was compliant with her medications. Recently her primary care physician increased the dose of Lasix. However, Wednesday of last week she started developing shortness of breath mainly with exertion. She also had symptoms suggestive of PND. She uses 2 pillows to sleep and has an increased number of pillows in the last few days. She has noticed leg swelling and her weight went up from 181 pounds to 184 pounds. Denies any nausea, vomiting. No chest pain. No fever, no chills. She has been extremely fatigued over the last few days. She tells me that she has been hospitalized 4-5 times during this year for similar problems. She came  in because her symptoms were not improving.  Assessment & Plan   Dyspnea secondary to Aute diastolic congestive heart failure vs Acute COPD exac -Echocardiogram in April 2016 shows EF Q000111Q, grade 2 diastolic dysfunction -Monitor intake and output, daily weights -No ACE inhibitor due to chronic kidney disease -Patient currently on IV Lasix- 40mg  IV BID -Cardiology consulted and appreciated -Down 3 pounds since admission? -UO in past 24hrs 1252ml -Patient has no history of smoking, but was exposed second hand -Cardiology started patient on prednisone, neb treatments -Patient continues to feel SOB with exertion -PT/OT consulted, recommended HH -Will repeat CXR  Chronic kidney disease, stage IV -Monitor creatinine intake and output closely due to diuresis -Baseline creatinine appears to be 2.2, currently 2.5  Diabetes mellitus, type II -Hemaglobin A1c 6.6 -Continue insulin sliding-scale CBG monitoring  Obstructive sleep apnea -Patient states she does not use a CPAP at home  Hypertension -Continue diuresis and amlodipine  Gout -Continue allopurinol  Normocytic anemia -Baseline hemoglobin appears to be approximately 8, currently 8.8 -Continue to monitor CBC  Code Status: Full  Family Communication: None at bedside  Disposition Plan: Admitted.  Likely discharge within the next 24-48hrs if improved  Time Spent in minutes   30 minutes  Procedures  None  Consults   Cardiology  DVT Prophylaxis  heparin  Lab Results  Component Value Date   PLT 241 08/30/2014    Medications  Scheduled Meds: . albuterol  2.5 mg Nebulization QID  . allopurinol  100 mg Oral BID  . amLODipine  10 mg Oral q morning - 10a  . aspirin EC  81 mg Oral Daily  . carvedilol  12.5 mg Oral BID WC  . furosemide  40 mg Intravenous Q12H  . heparin  5,000 Units Subcutaneous 3 times per day  . hydrALAZINE  10 mg Oral BID  . insulin aspart  0-9 Units Subcutaneous TID WC  . ipratropium  0.5 mg  Nebulization QID  . latanoprost  1 drop Both Eyes QHS  . nitroGLYCERIN  0.5 inch Topical 4 times per day  . predniSONE  40 mg Oral Q breakfast  . sodium chloride  3 mL Intravenous Q12H   Continuous Infusions:  PRN Meds:.sodium chloride, acetaminophen, albuterol, ondansetron (ZOFRAN) IV, sodium chloride  Antibiotics    Anti-infectives    None      Subjective:   Doris Howell seen and examined today.  Patient feels her breathing has improved slightly but has more with exertion.  She denies chest pain or abdominal pain, dizziness or headache at this time.   Objective:   Filed Vitals:   08/29/14 2201 08/30/14 0752 08/30/14 0803 08/30/14 0856  BP: 143/65 140/61 136/66   Pulse: 63 62 75   Temp: 97.4 F (36.3 C) 98 F (36.7 C)    TempSrc: Oral Oral    Resp: 18 18    Height:      Weight:      SpO2: 94% 95%  93%    Wt Readings from Last 3 Encounters:  08/28/14 86.7 kg (191 lb 2.2 oz)  08/12/14 85.095 kg (187 lb 9.6 oz)  08/02/14 83.598 kg (184 lb 4.8 oz)     Intake/Output Summary (Last 24 hours) at 08/30/14 1112 Last data filed at 08/30/14 0924  Gross per 24 hour  Intake    960 ml  Output   1000 ml  Net    -40 ml    Exam  General: Well developed, well nourished, No distress  HEENT: NCAT, mucous membranes moist.   Cardiovascular: S1 S2 auscultated, no rubs, murmurs or gallops. Regular rate and rhythm.  Respiratory: Diminished breath sounds  Abdomen: Soft, nontender, nondistended, + bowel sounds  Extremities: warm dry without cyanosis clubbing. Trace lower extremity edema bilaterally  Neuro: AAOx3, nonfocal  Psych: Appropriate mood and affect  Data Review   Micro Results No results found for this or any previous visit (from the past 240 hour(s)).  Radiology Reports Dg Chest Port 1 View  08/28/2014   CLINICAL DATA:  Congestive heart failure.  Shortness of breath.  EXAM: PORTABLE CHEST - 1 VIEW  COMPARISON:  07/29/2014.  FINDINGS: Cardiopericardial  silhouette is partially obscured by effusions, edema and atelectasis. Cardiomegaly remains present. Aortic arch atherosclerosis. Interstitial and airspace opacity with basilar predominance compatible with pulmonary edema. Bilateral pleural effusions.  IMPRESSION: Recurrent moderate CHF.   Electronically Signed   By: Dereck Ligas M.D.   On: 08/28/2014 13:02    CBC  Recent Labs Lab 08/28/14 1204 08/28/14 1235 08/29/14 0531 08/30/14 0320  WBC 6.4  --  5.7 5.6  HGB 8.9* 11.2* 8.4* 8.8*  HCT 28.0* 33.0* 26.2* 26.4*  PLT 279  --  262 241  MCV 88.3  --  89.1 89.2  MCH 28.1  --  28.6 29.7  MCHC 31.8  --  32.1 33.3  RDW 15.4  --  15.4 15.5  LYMPHSABS 0.7  --   --   --   MONOABS 0.7  --   --   --   EOSABS 0.2  --   --   --   BASOSABS 0.0  --   --   --     Chemistries   Recent Labs Lab 08/28/14 1204 08/28/14 1235 08/29/14 0531 08/30/14 0320  NA 142 142 141 142  K 3.6 3.6 3.5 3.8  CL 105 103 104 103  CO2 25  --  29 26  GLUCOSE 92 93 105* 196*  BUN 39* 41* 41* 46*  CREATININE 2.13* 2.10* 2.32* 2.55*  CALCIUM 9.5  --  9.5 9.3  AST 31  --   --   --   ALT 23  --   --   --   ALKPHOS 101  --   --   --   BILITOT 0.6  --   --   --    ------------------------------------------------------------------------------------------------------------------ estimated creatinine clearance is 18.5 mL/min (by C-G formula based on Cr of 2.55). ------------------------------------------------------------------------------------------------------------------  Recent Labs  08/29/14 0531  HGBA1C 6.6*   ------------------------------------------------------------------------------------------------------------------ No results for input(s): CHOL, HDL, LDLCALC, TRIG, CHOLHDL, LDLDIRECT in the last 72 hours. ------------------------------------------------------------------------------------------------------------------ No results for input(s): TSH, T4TOTAL, T3FREE, THYROIDAB in the last 72  hours.  Invalid input(s): FREET3 ------------------------------------------------------------------------------------------------------------------ No results for input(s): VITAMINB12, FOLATE, FERRITIN, TIBC, IRON, RETICCTPCT in the last 72 hours.  Coagulation profile No results for input(s): INR, PROTIME in the last 168 hours.  No results for input(s): DDIMER in the last 72 hours.  Cardiac Enzymes  Recent Labs Lab 08/28/14 2000 08/29/14 0001 08/29/14 0531  TROPONINI 0.03 0.03 0.03   ------------------------------------------------------------------------------------------------------------------ Invalid input(s): POCBNP    Zyah Gomm D.O. on 08/30/2014 at 11:12 AM  Between 7am to 7pm - Pager - [phone]  After 7pm go to [email or web address] - password [email or web address]  And look for the night coverage person covering for me after hours  [facility] Office  [phone]

## 2014-08-31 ENCOUNTER — Inpatient Hospital Stay (HOSPITAL_COMMUNITY): Payer: Medicare Other

## 2014-08-31 DIAGNOSIS — I509 Heart failure, unspecified: Secondary | ICD-10-CM

## 2014-08-31 LAB — URINE CULTURE: Colony Count: >=100000

## 2014-08-31 LAB — CBC
HCT: 26 % — ABNORMAL LOW (ref 36.0–46.0)
Hemoglobin: 8.3 g/dL — ABNORMAL LOW (ref 12.0–15.0)
MCH: 28.3 pg (ref 26.0–34.0)
MCHC: 31.9 g/dL (ref 30.0–36.0)
MCV: 88.7 fL (ref 78.0–100.0)
Platelets: 241 10*3/uL (ref 150–400)
RBC: 2.93 MIL/uL — ABNORMAL LOW (ref 3.87–5.11)
RDW: 15.5 % (ref 11.5–15.5)
WBC: 8.1 10*3/uL (ref 4.0–10.5)

## 2014-08-31 LAB — BASIC METABOLIC PANEL WITH GFR
Anion gap: 10 (ref 5–15)
BUN: 59 mg/dL — ABNORMAL HIGH (ref 6–20)
CO2: 27 mmol/L (ref 22–32)
Calcium: 9 mg/dL (ref 8.9–10.3)
Chloride: 103 mmol/L (ref 101–111)
Creatinine, Ser: 2.78 mg/dL — ABNORMAL HIGH (ref 0.44–1.00)
GFR calc Af Amer: 18 mL/min — ABNORMAL LOW (ref >60)
GFR calc non Af Amer: 15 mL/min — ABNORMAL LOW (ref >60)
Glucose, Bld: 119 mg/dL — ABNORMAL HIGH (ref 65–99)
Potassium: 3.8 mmol/L (ref 3.5–5.1)
Sodium: 140 mmol/L (ref 135–145)

## 2014-08-31 LAB — GLUCOSE, CAPILLARY
Glucose-Capillary: 114 mg/dL — ABNORMAL HIGH (ref 65–99)
Glucose-Capillary: 126 mg/dL — ABNORMAL HIGH (ref 65–99)
Glucose-Capillary: 311 mg/dL — ABNORMAL HIGH (ref 65–99)
Glucose-Capillary: 110 mg/dL — ABNORMAL HIGH (ref 65–99)

## 2014-08-31 MED ORDER — POLYETHYLENE GLYCOL 3350 17 G PO PACK
17.0000 g | PACK | Freq: Every day | ORAL | Status: DC
  Administered 2014-08-31 – 2014-09-08 (×9): 17 g via ORAL
  Filled 2014-08-31 (×12): qty 1

## 2014-08-31 MED ORDER — IPRATROPIUM-ALBUTEROL 0.5-2.5 (3) MG/3ML IN SOLN
3.0000 mL | Freq: Four times a day (QID) | RESPIRATORY_TRACT | Status: DC
Start: 2014-08-31 — End: 2014-09-01
  Administered 2014-08-31: 3 mL via RESPIRATORY_TRACT
  Filled 2014-08-31: qty 3

## 2014-08-31 MED ORDER — BISACODYL 10 MG RE SUPP: 10.0000 mg | Freq: Every day | RECTAL | Status: DC | PRN

## 2014-08-31 MED ORDER — FUROSEMIDE 10 MG/ML IJ SOLN
40.0000 mg | Freq: Two times a day (BID) | INTRAMUSCULAR | Status: DC
  Administered 2014-08-31 – 2014-09-01 (×2): 40 mg via INTRAVENOUS
  Filled 2014-08-31 (×2): qty 4

## 2014-08-31 NOTE — Progress Notes (Signed)
SUBJECTIVE: The patient is doing well today.   SOB is stable.  At this time, she denies chest pain, or any new concerns.  [name] albuterol  2.5 mg Nebulization QID  . allopurinol  100 mg Oral BID  . amLODipine  10 mg Oral q morning - 10a  . aspirin EC  81 mg Oral Daily  . carvedilol  12.5 mg Oral BID WC  . furosemide  40 mg Intravenous Q12H  . heparin  5,000 Units Subcutaneous 3 times per day  . hydrALAZINE  10 mg Oral BID  . insulin aspart  0-9 Units Subcutaneous TID WC  . ipratropium  0.5 mg Nebulization QID  . latanoprost  1 drop Both Eyes QHS  . nitroGLYCERIN  0.5 inch Topical 4 times per day  . sodium chloride  3 mL Intravenous Q12H      OBJECTIVE: Physical Exam: Filed Vitals:   08/30/14 2134 08/31/14 0616 08/31/14 0654 08/31/14 0830  BP: 140/62 112/54    Pulse: 65 62    Temp: 97.5 F (36.4 C) 97.5 F (36.4 C)    TempSrc: Oral Oral    Resp: 18 17    Height:      Weight:   87.363 kg (192 lb 9.6 oz)   SpO2: 97% 96%  96%    Intake/Output Summary (Last 24 hours) at 08/31/14 1117 Last data filed at 08/31/14 [id number]  Gross per 24 hour  Intake    720 ml  Output    700 ml  Net     20 ml    Telemetry reveals sinus rhythm  GEN- The patient is chronically ill and overweight appearing, alert and oriented x 3 today.   Head- normocephalic, atraumatic Eyes-  Sclera clear, conjunctiva pink Ears- hearing intact Oropharynx- clear Neck- supple, + JVD Lungs- prolonged expiratory phase, rales are improved, normal work of breathing Heart- Regular rate and rhythm  GI- soft, NT, ND, + BS Extremities- no clubbing, cyanosis, no edema Skin- no rash or lesion Psych- euthymic mood, full affect Neuro- strength and sensation are intact  LABS: Basic Metabolic Panel:  Recent Labs  08/30/14 0320 08/31/14 0559  NA 142 140  K 3.8 3.8  CL 103 103  CO2 26 27  GLUCOSE 196* 119*  BUN 46* 59*  CREATININE 2.55* 2.78*  CALCIUM 9.3 9.0   Liver Function Tests:  Recent Labs   08/28/14 1204  AST 31  ALT 23  ALKPHOS 101  BILITOT 0.6  PROT 7.0  ALBUMIN 3.4*   No results for input(s): LIPASE, AMYLASE in the last 72 hours. CBC:  Recent Labs  08/28/14 1204  08/30/14 0320 08/31/14 0559  WBC 6.4  < > 5.6 8.1  NEUTROABS 4.7  --   --   --   HGB 8.9*  < > 8.8* 8.3*  HCT 28.0*  < > 26.4* 26.0*  MCV 88.3  < > 89.2 88.7  PLT 279  < > 241 241  < > = values in this interval not displayed. Cardiac Enzymes:  Recent Labs  08/28/14 2000 08/29/14 0001 08/29/14 0531  TROPONINI 0.03 0.03 0.03   1. Acute on chronic combined systolic diastolic chf Remains volume overloaded but is certainly improving I am not convinced that daily weights/ IsOs are accurate Will continue diuresis for now though renal function has worsened May need nephrology consultation.  2. Acute on chronic renal failure Renal worsening is worrisome Continue IV diuresis for now given volume overload Consider nephrology consult  3. Morbid  obesity Weight reduction is advised  4. HTN Stable No change required today  5. Diabetes Worsened by steroids  6. OSA Should use cpap but has not purchased her unit yet Compliance is advised I have asked RT to placed CPAP tonight  Long term prognosis is poor Weight reduction, compliance with CPAP, lifestyle modification would help   Thompson Grayer, MD 08/31/2014 11:17 AM

## 2014-08-31 NOTE — Progress Notes (Signed)
[facility]                                                                              Patient Demographics  Doris Howell, is a 79 y.o. female, DOB - Feb 03, 1936, [id number]  Admit date - 08/28/2014   Admitting Physician Bonnielee Haff, MD  Outpatient Primary MD for the patient is Irven Shelling, MD  LOS - 3   Chief Complaint  Patient presents with  . Congestive Heart Failure  . Shortness of Breath      HPI on 08/28/2014 by Dr. Bonnielee Haff Doris Howell is a 79 y.o. female with a past medical history of diastolic congestive heart failure, hypertension, type 2 diabetes, who was hospitalized in April for congestive heart failure. She had a follow-up appointment with her cardiologist on May 3. She underwent a right heart catheterization in January and was found to have a pulmonary artery pressure of 64/25 with 40. She was thought to have biventricular heart failure with preserved left and ejection fraction. She also underwent a lexican myoview in January which showed left ventricle systolic dysfunction with EF of 44% without any ischemia.  Patient tells me that after her last hospitalization she was actually doing well. She was checking her weight on a daily basis. She was adhering to her dietary restrictions. She does not consume any salt. She was compliant with her medications. Recently her primary care physician increased the dose of Lasix. However, Wednesday of last week she started developing shortness of breath mainly with exertion. She also had symptoms suggestive of PND. She uses 2 pillows to sleep and has an increased number of pillows in the last few days. She has noticed leg swelling and her weight went up from 181 pounds to 184 pounds. Denies any nausea, vomiting. No chest pain. No fever, no chills. She has been extremely fatigued over the last few days. She tells me that she has been hospitalized 4-5 times during this year for similar problems. She came  in because her symptoms were not improving.  Assessment & Plan   Dyspnea secondary to Aute diastolic congestive heart failure vs Acute COPD exac -Echocardiogram in April 2016 shows EF Q000111Q, grade 2 diastolic dysfunction -Monitor intake and output, daily weights -No ACE inhibitor due to chronic kidney disease -Patient currently on IV Lasix- 40mg  IV BID -Cardiology consulted and appreciated -Down 3 pounds since admission? -UO in past 24hrs 757ml (not sure if strict I/O are being recorded) -Patient has no history of smoking, but is exposed second hand -Continue neb treatments -Trial of prednisone, no improvement -Patient continues to feel SOB with exertion -PT/OT consulted, recommended HH -Repeat CXR- recurrent moderate CHF  Chronic kidney disease, stage IV -Monitor creatinine intake and output closely due to diuresis -Baseline creatinine appears to be 2.2, currently 2.78 -GFR still maintaining in stage 4 -Obtain renal US  Diabetes mellitus, type II -Hemaglobin A1c 6.6 -Continue insulin sliding-scale CBG monitoring  Obstructive sleep apnea -Patient states she does not use a CPAP at home- she stated she has not bought it yet due to financial reasons -Patient keeps refusing CPAP during hospitalization -Advised patient to wear CPAP at night  Hypertension -Stable, Continue diuresis  and amlodipine  Gout -Continue allopurinol  Normocytic anemia -Baseline hemoglobin appears to be approximately 8, currently 8.3 -Continue to monitor CBC  Code Status: Full  Family Communication: None at bedside  Disposition Plan: Admitted.  Likely discharge within the next 24-48hrs if improved  Time Spent in minutes   30 minutes  Procedures  None  Consults   Cardiology  DVT Prophylaxis  heparin  Lab Results  Component Value Date   PLT 241 08/31/2014    Medications  Scheduled Meds: . albuterol  2.5 mg Nebulization QID  . allopurinol  100 mg Oral BID  . amLODipine  10 mg Oral  q morning - 10a  . aspirin EC  81 mg Oral Daily  . carvedilol  12.5 mg Oral BID WC  . furosemide  40 mg Intravenous Q12H  . heparin  5,000 Units Subcutaneous 3 times per day  . hydrALAZINE  10 mg Oral BID  . insulin aspart  0-9 Units Subcutaneous TID WC  . ipratropium  0.5 mg Nebulization QID  . latanoprost  1 drop Both Eyes QHS  . nitroGLYCERIN  0.5 inch Topical 4 times per day  . sodium chloride  3 mL Intravenous Q12H   Continuous Infusions:  PRN Meds:.sodium chloride, acetaminophen, albuterol, docusate sodium, ondansetron (ZOFRAN) IV, sodium chloride  Antibiotics    Anti-infectives    None      Subjective:   Doris Howell seen and examined today.  Patient continues to feel short of breath when she moves; otherwise no change.  Denies chest pain, dizziness, headache, abdominal pain.   Objective:   Filed Vitals:   08/30/14 2134 08/31/14 0616 08/31/14 0654 08/31/14 0830  BP: 140/62 112/54    Pulse: 65 62    Temp: 97.5 F (36.4 C) 97.5 F (36.4 C)    TempSrc: Oral Oral    Resp: 18 17    Height:      Weight:   87.363 kg (192 lb 9.6 oz)   SpO2: 97% 96%  96%    Wt Readings from Last 3 Encounters:  08/31/14 87.363 kg (192 lb 9.6 oz)  08/12/14 85.095 kg (187 lb 9.6 oz)  08/02/14 83.598 kg (184 lb 4.8 oz)     Intake/Output Summary (Last 24 hours) at 08/31/14 1120 Last data filed at 08/31/14 [id number]  Gross per 24 hour  Intake    720 ml  Output    700 ml  Net     20 ml    Exam  General: Well developed, well nourished, No distress  HEENT: NCAT, mucous membranes moist.   Cardiovascular: S1 S2 auscultated, RRR, no murmurs  Respiratory: Prolonged expiratory phase, scattered rales  Abdomen: Soft, nontender, nondistended, + bowel sounds  Extremities: warm dry without cyanosis clubbing, no edema  Neuro: AAOx3, nonfocal  Psych: Pleasant  Data Review   Micro Results Recent Results (from the past 240 hour(s))  Culture, Urine     Status: None (Preliminary  result)   Collection Time: 08/29/14  6:48 PM  Result Value Ref Range Status   Specimen Description URINE, CATHETERIZED  Final   Special Requests NONE  Final   Colony Count   Final    >=100,000 COLONIES/ML Performed at [place]   Final    ESCHERICHIA COLI Performed at [facility]    Report Status PENDING  Incomplete    Radiology Reports Dg Chest Port 1 View  08/28/2014   CLINICAL DATA:  Congestive heart failure.  Shortness  of breath.  EXAM: PORTABLE CHEST - 1 VIEW  COMPARISON:  07/29/2014.  FINDINGS: Cardiopericardial silhouette is partially obscured by effusions, edema and atelectasis. Cardiomegaly remains present. Aortic arch atherosclerosis. Interstitial and airspace opacity with basilar predominance compatible with pulmonary edema. Bilateral pleural effusions.  IMPRESSION: Recurrent moderate CHF.   Electronically Signed   By: Dereck Ligas M.D.   On: 08/28/2014 13:02    CBC  Recent Labs Lab 08/28/14 1204 08/28/14 1235 08/29/14 0531 08/30/14 0320 08/31/14 0559  WBC 6.4  --  5.7 5.6 8.1  HGB 8.9* 11.2* 8.4* 8.8* 8.3*  HCT 28.0* 33.0* 26.2* 26.4* 26.0*  PLT 279  --  262 241 241  MCV 88.3  --  89.1 89.2 88.7  MCH 28.1  --  28.6 29.7 28.3  MCHC 31.8  --  32.1 33.3 31.9  RDW 15.4  --  15.4 15.5 15.5  LYMPHSABS 0.7  --   --   --   --   MONOABS 0.7  --   --   --   --   EOSABS 0.2  --   --   --   --   BASOSABS 0.0  --   --   --   --     Chemistries   Recent Labs Lab 08/28/14 1204 08/28/14 1235 08/29/14 0531 08/30/14 0320 08/31/14 0559  NA 142 142 141 142 140  K 3.6 3.6 3.5 3.8 3.8  CL 105 103 104 103 103  CO2 25  --  29 26 27   GLUCOSE 92 93 105* 196* 119*  BUN 39* 41* 41* 46* 59*  CREATININE 2.13* 2.10* 2.32* 2.55* 2.78*  CALCIUM 9.5  --  9.5 9.3 9.0  AST 31  --   --   --   --   ALT 23  --   --   --   --   ALKPHOS 101  --   --   --   --   BILITOT 0.6  --   --   --   --     ------------------------------------------------------------------------------------------------------------------ estimated creatinine clearance is 17 mL/min (by C-G formula based on Cr of 2.78). ------------------------------------------------------------------------------------------------------------------  Recent Labs  08/29/14 0531  HGBA1C 6.6*   ------------------------------------------------------------------------------------------------------------------ No results for input(s): CHOL, HDL, LDLCALC, TRIG, CHOLHDL, LDLDIRECT in the last 72 hours. ------------------------------------------------------------------------------------------------------------------ No results for input(s): TSH, T4TOTAL, T3FREE, THYROIDAB in the last 72 hours.  Invalid input(s): FREET3 ------------------------------------------------------------------------------------------------------------------ No results for input(s): VITAMINB12, FOLATE, FERRITIN, TIBC, IRON, RETICCTPCT in the last 72 hours.  Coagulation profile No results for input(s): INR, PROTIME in the last 168 hours.  No results for input(s): DDIMER in the last 72 hours.  Cardiac Enzymes  Recent Labs Lab 08/28/14 2000 08/29/14 0001 08/29/14 0531  TROPONINI 0.03 0.03 0.03   ------------------------------------------------------------------------------------------------------------------ Invalid input(s): POCBNP    Montell Leopard D.O. on 08/31/2014 at 11:20 AM  Between 7am to 7pm - Pager - [phone]  After 7pm go to [email or web address] - password [email or web address]  And look for the night coverage person covering for me after hours  [facility] Office  [phone]

## 2014-09-01 ENCOUNTER — Inpatient Hospital Stay (HOSPITAL_COMMUNITY): Payer: Medicare Other

## 2014-09-01 DIAGNOSIS — R001 Bradycardia, unspecified: Secondary | ICD-10-CM

## 2014-09-01 LAB — GLUCOSE, CAPILLARY
Glucose-Capillary: 75 mg/dL (ref 65–99)
Glucose-Capillary: 155 mg/dL — ABNORMAL HIGH (ref 65–99)
Glucose-Capillary: 157 mg/dL — ABNORMAL HIGH (ref 65–99)
Glucose-Capillary: 152 mg/dL — ABNORMAL HIGH (ref 65–99)

## 2014-09-01 LAB — CBC
HCT: 26.5 % — ABNORMAL LOW (ref 36.0–46.0)
Hemoglobin: 8.7 g/dL — ABNORMAL LOW (ref 12.0–15.0)
MCH: 29.1 pg (ref 26.0–34.0)
MCHC: 32.8 g/dL (ref 30.0–36.0)
MCV: 88.6 fL (ref 78.0–100.0)
Platelets: 250 10*3/uL (ref 150–400)
RBC: 2.99 MIL/uL — ABNORMAL LOW (ref 3.87–5.11)
RDW: 15.5 % (ref 11.5–15.5)
WBC: 9.9 10*3/uL (ref 4.0–10.5)

## 2014-09-01 LAB — BASIC METABOLIC PANEL
Anion gap: 9 (ref 5–15)
BUN: 64 mg/dL — ABNORMAL HIGH (ref 6–20)
CO2: 28 mmol/L (ref 22–32)
Calcium: 9 mg/dL (ref 8.9–10.3)
Chloride: 103 mmol/L (ref 101–111)
Creatinine, Ser: 2.77 mg/dL — ABNORMAL HIGH (ref 0.44–1.00)
GFR calc Af Amer: 18 mL/min — ABNORMAL LOW (ref >60)
GFR calc non Af Amer: 15 mL/min — ABNORMAL LOW (ref >60)
Glucose, Bld: 66 mg/dL (ref 65–99)
Potassium: 3.8 mmol/L (ref 3.5–5.1)
Sodium: 140 mmol/L (ref 135–145)

## 2014-09-01 MED ORDER — LORAZEPAM 0.5 MG PO TABS
0.5000 mg | ORAL_TABLET | Freq: Once | ORAL | Status: AC
  Administered 2014-09-01: 0.5 mg via ORAL
  Filled 2014-09-01: qty 1

## 2014-09-01 MED ORDER — DARBEPOETIN ALFA 100 MCG/0.5ML IJ SOSY
100.0000 ug | PREFILLED_SYRINGE | Freq: Once | INTRAMUSCULAR | Status: AC
  Administered 2014-09-01: 100 ug via SUBCUTANEOUS
  Filled 2014-09-01: qty 0.5

## 2014-09-01 MED ORDER — IPRATROPIUM-ALBUTEROL 0.5-2.5 (3) MG/3ML IN SOLN
3.0000 mL | Freq: Two times a day (BID) | RESPIRATORY_TRACT | Status: DC
  Administered 2014-09-01 – 2014-09-11 (×19): 3 mL via RESPIRATORY_TRACT
  Filled 2014-09-01 (×19): qty 3

## 2014-09-01 MED ORDER — ISOSORBIDE MONONITRATE ER 30 MG PO TB24
30.0000 mg | ORAL_TABLET | Freq: Every day | ORAL | Status: DC
  Administered 2014-09-01 – 2014-09-11 (×11): 30 mg via ORAL
  Filled 2014-09-01 (×11): qty 1

## 2014-09-01 MED ORDER — LORAZEPAM 2 MG/ML IJ SOLN: 0.5000 mg | Freq: Once | INTRAMUSCULAR | Status: DC

## 2014-09-01 MED ORDER — LORAZEPAM 0.5 MG PO TABS
0.5000 mg | ORAL_TABLET | Freq: Every evening | ORAL | Status: DC | PRN
  Administered 2014-09-01: 0.5 mg via ORAL
  Filled 2014-09-01: qty 1

## 2014-09-01 MED ORDER — FUROSEMIDE 10 MG/ML IJ SOLN
80.0000 mg | Freq: Two times a day (BID) | INTRAMUSCULAR | Status: DC
  Administered 2014-09-01 – 2014-09-04 (×6): 80 mg via INTRAVENOUS
  Filled 2014-09-01 (×6): qty 8

## 2014-09-01 NOTE — Progress Notes (Signed)
[facility]                                                                              Patient Demographics  Doris Howell, is a 79 y.o. female, DOB - Oct 15, 1935, [id number]  Admit date - 08/28/2014   Admitting Physician Bonnielee Haff, MD  Outpatient Primary MD for the patient is Irven Shelling, MD  LOS - 4   Chief Complaint  Patient presents with  . Congestive Heart Failure  . Shortness of Breath      HPI on 08/28/2014 by Dr. Bonnielee Haff Doris Howell is a 79 y.o. female with a past medical history of diastolic congestive heart failure, hypertension, type 2 diabetes, who was hospitalized in April for congestive heart failure. She had a follow-up appointment with her cardiologist on May 3. She underwent a right heart catheterization in January and was found to have a pulmonary artery pressure of 64/25 with 40. She was thought to have biventricular heart failure with preserved left and ejection fraction. She also underwent a lexican myoview in January which showed left ventricle systolic dysfunction with EF of 44% without any ischemia. Patient tells me that after her last hospitalization she was actually doing well. She was checking her weight on a daily basis. She was adhering to her dietary restrictions. She does not consume any salt. She was compliant with her medications. Recently her primary care physician increased the dose of Lasix. However, Wednesday of last week she started developing shortness of breath mainly with exertion. She also had symptoms suggestive of PND. She uses 2 pillows to sleep and has an increased number of pillows in the last few days. She has noticed leg swelling and her weight went up from 181 pounds to 184 pounds. Denies any nausea, vomiting. No chest pain. No fever, no chills. She has been extremely fatigued over the last few days. She tells me that she has been hospitalized 4-5 times during this year for similar problems. She came in  because her symptoms were not improving.  Assessment & Plan   Dyspnea secondary to Aute diastolic congestive heart failure vs Acute COPD exac -Echocardiogram in April 2016 shows EF [id number], grade 2 diastolic dysfunction -Monitor intake and output, daily weights -No ACE inhibitor due to chronic kidney disease -Patient currently on IV Lasix- 40mg  IV BID -Cardiology consulted and appreciated -Down 3 pounds since admission? -UO in past 24hrs 836ml (not sure if strict I/O are being recorded) -Patient has no history of smoking, but is exposed second hand -Continue neb treatments -Trial of prednisone, no improvement -Patient continues to feel SOB with exertion -PT/OT consulted, recommended HH  -CXR recurrent moderate CHF -pending repeat CXR today -Not completely convinced that this is CHF or COPD Exac  Chronic kidney disease, stage IV -Monitor creatinine intake and output closely due to diuresis -Baseline creatinine appears to be 2.2, currently 2.77 -GFR still maintaining in stage 4 -Renal US: chronic disease, no hydronephrosis -Spoke with Dr. Justin Mend, recommended PTH level and aranesp injection, with outpatient follow-up  Diabetes mellitus, type II -Hemaglobin A1c 6.6 -Continue insulin sliding-scale CBG monitoring  Obstructive sleep apnea -Patient states she does not use a CPAP at home- she  stated she has not bought it yet due to financial reasons -Patient was refusing CPAP during hospitalization -Spoke with Respiratory regarding smaller mask -Will order ativan QHS PRN as patient has anxiety with the mask -Advised patient to wear CPAP at night   Hypertension -Stable, Continue diuresis and amlodipine  Gout -Continue allopurinol  Normocytic anemia/Chronic disease -Baseline hemoglobin appears to be approximately 8, currently 8.7 -Continue to monitor CBC -Iron in Jan 2016, 107 -Will provide aranesp injection  Code Status: Full  Family Communication: None at  bedside  Disposition Plan: Admitted.  Likely discharge within the next 24-48hrs if improved  Time Spent in minutes   30 minutes  Procedures  None  Consults   Cardiology Nephrology, Dr. Justin Mend, via phone  DVT Prophylaxis  heparin  Lab Results  Component Value Date   PLT 250 09/01/2014    Medications  Scheduled Meds: . allopurinol  100 mg Oral BID  . amLODipine  10 mg Oral q morning - 10a  . aspirin EC  81 mg Oral Daily  . carvedilol  12.5 mg Oral BID WC  . darbepoetin (ARANESP) injection - NON-DIALYSIS  100 mcg Subcutaneous Once  . furosemide  40 mg Intravenous Q12H  . heparin  5,000 Units Subcutaneous 3 times per day  . hydrALAZINE  10 mg Oral BID  . insulin aspart  0-9 Units Subcutaneous TID WC  . ipratropium-albuterol  3 mL Nebulization BID  . isosorbide mononitrate  30 mg Oral Daily  . latanoprost  1 drop Both Eyes QHS  . polyethylene glycol  17 g Oral Daily  . sodium chloride  3 mL Intravenous Q12H   Continuous Infusions:  PRN Meds:.sodium chloride, acetaminophen, albuterol, bisacodyl, docusate sodium, ondansetron (ZOFRAN) IV, sodium chloride  Antibiotics    Anti-infectives    None      Subjective:   Doris Howell seen and examined today.  Patient states she used the CPap last night, felt it was not working properly. Patient states she became very anxious. Patient denies any change in her shortness of breath. She denies any chest pain dizziness, headache, abdominal pain, diarrhea or constipation at this time.   Objective:   Filed Vitals:   08/31/14 2204 09/01/14 0655 09/01/14 0858 09/01/14 0946  BP: 135/63 125/59  115/49  Pulse: 61 67  58  Temp: 97.3 F (36.3 C) 97.7 F (36.5 C)    TempSrc: Oral Axillary    Resp: 18 18  20   Height:      Weight:  87.816 kg (193 lb 9.6 oz)    SpO2: 95% 96% 97% 95%    Wt Readings from Last 3 Encounters:  09/01/14 87.816 kg (193 lb 9.6 oz)  08/12/14 85.095 kg (187 lb 9.6 oz)  08/02/14 83.598 kg (184 lb 4.8 oz)      Intake/Output Summary (Last 24 hours) at 09/01/14 1143 Last data filed at 09/01/14 0946  Gross per 24 hour  Intake   1020 ml  Output    500 ml  Net    520 ml    Exam  General: Well developed, well nourished, No distress  HEENT: NCAT, mucous membranes moist.   Cardiovascular: S1 S2 auscultated, RRR, no murmurs  Respiratory: Prolonged expiratory phase, diminished breath sounds, no crackles or rhonchi  Abdomen: Soft, nontender, nondistended, + bowel sounds  Extremities: warm dry without cyanosis clubbing. Trace edema in LE B/L   Neuro: AAOx3, nonfocal  Psych: Pleasant, normal affect and mood  Data Review   Micro Results Recent Results (  from the past 240 hour(s))  Culture, Urine     Status: None   Collection Time: 08/29/14  6:48 PM  Result Value Ref Range Status   Specimen Description URINE, CATHETERIZED  Final   Special Requests NONE  Final   Colony Count   Final    >=100,000 COLONIES/ML Performed at [facility]   Final    ESCHERICHIA COLI Performed at [facility]    Report Status 08/31/2014 FINAL  Final   Organism ID, Bacteria ESCHERICHIA COLI  Final      Susceptibility   Escherichia coli - MIC*    AMPICILLIN >=32 RESISTANT Resistant     CEFAZOLIN <=4 SENSITIVE Sensitive     CEFTRIAXONE <=1 SENSITIVE Sensitive     CIPROFLOXACIN >=4 RESISTANT Resistant     GENTAMICIN <=1 SENSITIVE Sensitive     LEVOFLOXACIN >=8 RESISTANT Resistant     NITROFURANTOIN <=16 SENSITIVE Sensitive     TOBRAMYCIN <=1 SENSITIVE Sensitive     TRIMETH/SULFA <=20 SENSITIVE Sensitive     PIP/TAZO <=4 SENSITIVE Sensitive     * ESCHERICHIA COLI    Radiology Reports US Renal  08/31/2014   CLINICAL DATA:  Patient with acute renal failure. Prior left nephrectomy.  EXAM: RENAL / URINARY TRACT ULTRASOUND COMPLETE  COMPARISON:  CT 04/05/2014  FINDINGS: Right Kidney:  Length: 11.5 cm. Diffusely increased renal parenchymal echogenicity. No hydronephrosis.  Left  Kidney:  Surgically absent.  Bladder:  Appears normal for degree of bladder distention.  IMPRESSION: Increased renal parenchymal echogenicity suggestive of chronic medical renal disease. No hydronephrosis.   Electronically Signed   By: Lovey Newcomer M.D.   On: 08/31/2014 21:41   Dg Chest Port 1 View  08/28/2014   CLINICAL DATA:  Congestive heart failure.  Shortness of breath.  EXAM: PORTABLE CHEST - 1 VIEW  COMPARISON:  07/29/2014.  FINDINGS: Cardiopericardial silhouette is partially obscured by effusions, edema and atelectasis. Cardiomegaly remains present. Aortic arch atherosclerosis. Interstitial and airspace opacity with basilar predominance compatible with pulmonary edema. Bilateral pleural effusions.  IMPRESSION: Recurrent moderate CHF.   Electronically Signed   By: Dereck Ligas M.D.   On: 08/28/2014 13:02    CBC  Recent Labs Lab 08/28/14 1204 08/28/14 1235 08/29/14 0531 08/30/14 0320 08/31/14 0559 09/01/14 0454  WBC 6.4  --  5.7 5.6 8.1 9.9  HGB 8.9* 11.2* 8.4* 8.8* 8.3* 8.7*  HCT 28.0* 33.0* 26.2* 26.4* 26.0* 26.5*  PLT 279  --  262 241 241 250  MCV 88.3  --  89.1 89.2 88.7 88.6  MCH 28.1  --  28.6 29.7 28.3 29.1  MCHC 31.8  --  32.1 33.3 31.9 32.8  RDW 15.4  --  15.4 15.5 15.5 15.5  LYMPHSABS 0.7  --   --   --   --   --   MONOABS 0.7  --   --   --   --   --   EOSABS 0.2  --   --   --   --   --   BASOSABS 0.0  --   --   --   --   --     Chemistries   Recent Labs Lab 08/28/14 1204 08/28/14 1235 08/29/14 0531 08/30/14 0320 08/31/14 0559 09/01/14 0454  NA 142 142 141 142 140 140  K 3.6 3.6 3.5 3.8 3.8 3.8  CL 105 103 104 103 103 103  CO2 25  --  29 26 27 28   GLUCOSE  92 93 105* 196* 119* 66  BUN 39* 41* 41* 46* 59* 64*  CREATININE 2.13* 2.10* 2.32* 2.55* 2.78* 2.77*  CALCIUM 9.5  --  9.5 9.3 9.0 9.0  AST 31  --   --   --   --   --   ALT 23  --   --   --   --   --   ALKPHOS 101  --   --   --   --   --   BILITOT 0.6  --   --   --   --   --     ------------------------------------------------------------------------------------------------------------------ estimated creatinine clearance is 17.1 mL/min (by C-G formula based on Cr of 2.77). ------------------------------------------------------------------------------------------------------------------ No results for input(s): HGBA1C in the last 72 hours. ------------------------------------------------------------------------------------------------------------------ No results for input(s): CHOL, HDL, LDLCALC, TRIG, CHOLHDL, LDLDIRECT in the last 72 hours. ------------------------------------------------------------------------------------------------------------------ No results for input(s): TSH, T4TOTAL, T3FREE, THYROIDAB in the last 72 hours.  Invalid input(s): FREET3 ------------------------------------------------------------------------------------------------------------------ No results for input(s): VITAMINB12, FOLATE, FERRITIN, TIBC, IRON, RETICCTPCT in the last 72 hours.  Coagulation profile No results for input(s): INR, PROTIME in the last 168 hours.  No results for input(s): DDIMER in the last 72 hours.  Cardiac Enzymes  Recent Labs Lab 08/28/14 2000 08/29/14 0001 08/29/14 0531  TROPONINI 0.03 0.03 0.03   ------------------------------------------------------------------------------------------------------------------ Invalid input(s): POCBNP    Kyion Gautier D.O. on 09/01/2014 at 11:43 AM  Between 7am to 7pm - Pager - [phone]  After 7pm go to [email or web address] - password [email or web address]  And look for the night coverage person covering for me after hours  [facility] Office  [phone]

## 2014-09-01 NOTE — Progress Notes (Signed)
Patient Name: Doris Howell Date of Encounter: 09/01/2014   Principal Problem:   Acute on chronic combined systolic and diastolic CHF (congestive heart failure) Active Problems:   SOB (shortness of breath)   Sleep apnea   Type 2 diabetes mellitus with stage 4 chronic kidney disease   Chronic kidney disease (CKD), stage IV (severe)   COPD with exacerbation   Acute on chronic renal failure   Bradycardia- beta blocker decreased   SUBJECTIVE  Currently feeling well with no acute complaints @ rest but says the least amount of activity causes her to be dyspneic.  Also orthopneic. Overnight she struggled with CPAP and was anxious. She ultimately tolerated CPAP overnight with lorazepam.   CURRENT MEDS . allopurinol  100 mg Oral BID  . amLODipine  10 mg Oral q morning - 10a  . aspirin EC  81 mg Oral Daily  . carvedilol  12.5 mg Oral BID WC  . furosemide  40 mg Intravenous Q12H  . heparin  5,000 Units Subcutaneous 3 times per day  . hydrALAZINE  10 mg Oral BID  . insulin aspart  0-9 Units Subcutaneous TID WC  . ipratropium-albuterol  3 mL Nebulization BID  . latanoprost  1 drop Both Eyes QHS  . nitroGLYCERIN  0.5 inch Topical 4 times per day  . polyethylene glycol  17 g Oral Daily  . sodium chloride  3 mL Intravenous Q12H    OBJECTIVE  Filed Vitals:   08/31/14 1515 08/31/14 2204 09/01/14 0655 09/01/14 0858  BP:  135/63 125/59   Pulse:  61 67   Temp:  97.3 F (36.3 C) 97.7 F (36.5 C)   TempSrc:  Oral Axillary   Resp:  18 18   Height:      Weight:   193 lb 9.6 oz (87.816 kg)   SpO2: 96% 95% 96% 97%    Intake/Output Summary (Last 24 hours) at 09/01/14 0939 Last data filed at 08/31/14 2254  Gross per 24 hour  Intake    680 ml  Output    800 ml  Net   -120 ml   Filed Weights   08/28/14 2103 08/31/14 0654 09/01/14 0655  Weight: 191 lb 2.2 oz (86.7 kg) 192 lb 9.6 oz (87.363 kg) 193 lb 9.6 oz (87.816 kg)    PHYSICAL EXAM  General: Obese, hirsute, pleasant,  NAD. Neuro: Alert and oriented X 3. Moves all extremities spontaneously. Psych: Normal affect. HEENT:  Normal  Neck: Supple without bruits. JVP difficult to gauge due to girth but not overtly elevated.  Lungs:  Lung sounds distant and very diminished in the bases. No audible crackles or wheezes.  Heart: RRR. S1, S2, and S3 present. No S4. No murmurs.  Abdomen: Soft, non-tender, slightly distended, BS + x 4.  Extremities: No clubbing or cyanosis. Trace pretibial edema. DP/PT/Radials 2+ and equal bilaterally.  Accessory Clinical Findings  CBC  Recent Labs  08/31/14 0559 09/01/14 0454  WBC 8.1 9.9  HGB 8.3* 8.7*  HCT 26.0* 26.5*  MCV 88.7 88.6  PLT 241 [id number]   Basic Metabolic Panel  Recent Labs  08/31/14 0559 09/01/14 0454  NA 140 140  K 3.8 3.8  CL 103 103  CO2 27 28  GLUCOSE 119* 66  BUN 59* 64*  CREATININE 2.78* 2.77*  CALCIUM 9.0 9.0    Radiology/Studies  US Renal  08/31/2014   CLINICAL DATA:  Patient with acute renal failure. Prior left nephrectomy.  EXAM: RENAL / URINARY TRACT ULTRASOUND COMPLETE  COMPARISON:  CT 04/05/2014  FINDINGS: Right Kidney:  Length: 11.5 cm. Diffusely increased renal parenchymal echogenicity. No hydronephrosis.  Left Kidney:  Surgically absent.  Bladder:  Appears normal for degree of bladder distention.  IMPRESSION: Increased renal parenchymal echogenicity suggestive of chronic medical renal disease. No hydronephrosis.   Electronically Signed   By: Lovey Newcomer M.D.   On: 08/31/2014 21:41   Dg Chest Port 1 View  08/28/2014   CLINICAL DATA:  Congestive heart failure.  Shortness of breath.  EXAM: PORTABLE CHEST - 1 VIEW  COMPARISON:  07/29/2014.  FINDINGS: Cardiopericardial silhouette is partially obscured by effusions, edema and atelectasis. Cardiomegaly remains present. Aortic arch atherosclerosis. Interstitial and airspace opacity with basilar predominance compatible with pulmonary edema. Bilateral pleural effusions.  IMPRESSION: Recurrent  moderate CHF.   Electronically Signed   By: Dereck Ligas M.D.   On: 08/28/2014 13:02    ASSESSMENT AND PLAN  1. Acute on chronic combined systolic and diastolic heart failure: Remains dyspneic with minimal activity and orthopnea.  Still with mild volume excess on exam, though wts remain 8 lbs above prior d/c wt (185 4/23, 187 on 5/3 office f/u). I>O net positive 220 mL over the last 24 hours, though I am not certain her I&Os are accurate. Renal function stable from yesterday but still poor - though similar to previous discharge creat (2.63).  Consider renal consultation. For now continue furosemide 40 mg IV BID, though may require more.  Consider metolazone.  Not clear that all of her dyspnea is CHF related.  Will f/u cxr to re-eval pleural effusion given markedly diminished breath sounds. Cont bb, hydral/nitrate.  No acei/arb/arni/spiro.  2. Acute on chronic kidney disease stage IV: per above, renal function is stable but remains poor (see above). Consider nephrology consult. U/S w/o hydronephrosis.  Continue Lasix 40 mg BID for now.  3. HTN: stable and well-controlled. Cont bb, hydral/nitrate/ccb.  4. DM II: currently well-controlled on SSI.   5. OSA: struggled with CPAP overnight but tolerated well after receiving PRN lorazepam. Highly recommend compliance to CPAP at night.   6. Morbid obesity: weight reduction advised.   7.  Normocytic anemia:  Stable.  Signed, Murray Hodgkins NP As above; patient seen and examined; dyspnea persists but improved; no chest pain; weight remains increased and net I/Os only -290; plan to increase lasix to 80 BID; follow renal function; symptoms out of proportion to exam; repeat chest xray; may need RHC to help with management. Kirk Ruths

## 2014-09-01 NOTE — Progress Notes (Signed)
Occupational Therapy Treatment Patient Details Name: SHENIECE ATNIP MRN: [id number] DOB: Aug 11, 1935 Today's Date: 09/01/2014    History of present illness 79 y.o. female with a past medical history of diastolic congestive heart failure, hypertension, type 2 diabetes, who was hospitalized in April for congestive heart failure. She underwent a right heart catheterization in January. Currently presenting with increased SOB and dyspnea on exertion likely acute on chronic CHF.   OT comments  Pt may need SNF as she is worried about husband may not be able to provide enough A  Follow Up Recommendations  SNF;Home health OT;Supervision/Assistance - 24 hour;Other (comment) (depending on care at home)    Equipment Recommendations  3 in 1 bedside comode    Recommendations for Other Services      Precautions / Restrictions Precautions Precautions: Fall Precaution Comments: oxygen       Mobility Bed Mobility Overal bed mobility: Needs Assistance Bed Mobility: Supine to Sit     Supine to sit: Min guard        Transfers Overall transfer level: Needs assistance Equipment used: Rolling walker (2 wheeled) Transfers: Sit to/from Stand Sit to Stand: Min assist         General transfer comment: Cues for technique, safety, hand placement. Multiple cues needed for pursed lip breathing and overall safety.     Balance                                   ADL Overall ADL's : Needs assistance/impaired                         Toilet Transfer: Minimal assistance;BSC   Toileting- Clothing Manipulation and Hygiene: Sit to/from stand;Cueing for safety;Maximal assistance Toileting - Clothing Manipulation Details (indicate cue type and reason): pt donned underwear and pad and needed significant A       General ADL Comments: Pt may need more A at home than hasband can provide Pt fatigued quickly                Cognition   Behavior During Therapy: Flat  affect;WFL for tasks assessed/performed Overall Cognitive Status: Within Functional Limits for tasks assessed                               General Comments  per pt help at home is limited             Frequency       Progress Toward Goals  OT Goals(current goals can now be found in the care plan section)  Progress towards OT goals: Progressing toward goals     Plan Discharge plan needs to be updated       End of Session Equipment Utilized During Treatment: Rolling walker;Oxygen   Activity Tolerance Patient limited by fatigue   Patient Left in chair;with call bell/phone within reach   Nurse Communication Mobility status        Time: 1131-1145 OT Time Calculation (min): 14 min  Charges: OT General Charges $OT Visit: 1 Procedure OT Treatments $Self Care/Home Management : 8-22 mins  Ennis Heavner, Thereasa Parkin 09/01/2014, 12:05 PM

## 2014-09-01 NOTE — Progress Notes (Signed)
Physical Therapy Treatment Patient Details Name: Doris Howell MRN: [id number] DOB: [birth year] Today's Date: 09/01/2014    History of Present Illness 79 y.o. female with a past medical history of diastolic congestive heart failure, hypertension, type 2 diabetes, who was hospitalized in April for congestive heart failure. She underwent a right heart catheterization in January. Currently presenting with increased SOB and dyspnea on exertion likely acute on chronic CHF.    PT Comments    Pt very pleasant and able to increase gait today with encouragement. Encouraged increased mobility throughout the day with nursing as well as continued HEP. Pt requiring 3L to maintain sats 92-95% with gait. Will continue to follow.   Follow Up Recommendations  Home health PT;Supervision - Intermittent     Equipment Recommendations       Recommendations for Other Services       Precautions / Restrictions Precautions Precautions: Fall Precaution Comments: watch sats    Mobility  Bed Mobility Overal bed mobility: Needs Assistance Bed Mobility: Sit to Supine     Supine to sit: Min guard Sit to supine: Supervision   General bed mobility comments: cues for sequence to scoot toward HOB, pt with SOB with lying flat and required 30 degrees to increase oxygenation as sats dropped to 88% on 3L with decreased HOB  Transfers Overall transfer level: Modified independent Equipment used: Rolling walker (2 wheeled) Transfers: Sit to/from Stand Sit to Stand: Modified independent (Device/Increase time)         General transfer comment: Cues for technique, safety, hand placement. Multiple cues needed for pursed lip breathing and overall safety.   Ambulation/Gait Ambulation/Gait assistance: Supervision Ambulation Distance (Feet): 180 Feet Assistive device: Rolling walker (2 wheeled) Gait Pattern/deviations: Step-through pattern;Decreased stride length   Gait velocity interpretation: Below normal  speed for age/gender General Gait Details: cues for breathing technique with appropriate use of RW throughout   Stairs            Wheelchair Mobility    Modified Rankin (Stroke Patients Only)       Balance     Sitting balance-Leahy Scale: Good       Standing balance-Leahy Scale: Fair                      Cognition Arousal/Alertness: Awake/alert Behavior During Therapy: WFL for tasks assessed/performed Overall Cognitive Status: Within Functional Limits for tasks assessed                      Exercises General Exercises - Lower Extremity Long Arc Quad: AROM;Seated;Both;20 reps Hip Flexion/Marching: AROM;Seated;Both;20 reps    General Comments        Pertinent Vitals/Pain Pain Assessment: No/denies pain    Home Living                      Prior Function            PT Goals (current goals can now be found in the care plan section) Progress towards PT goals: Progressing toward goals    Frequency       PT Plan Current plan remains appropriate    Co-evaluation             End of Session Equipment Utilized During Treatment: Oxygen Activity Tolerance: Patient tolerated treatment well Patient left: in bed;Other (comment) (with transporters for transfer to xray)     Time: [id number] PT Time Calculation (min) (ACUTE ONLY): 27 min  Charges:  $Gait  Training: 8-22 mins $Therapeutic Exercise: 8-22 mins                    G Codes:      Melford Aase 09-02-14, 12:31 PM Elwyn Reach, [place]

## 2014-09-01 NOTE — Progress Notes (Signed)
Pt stating to RN that machine is not working correctly. RT checked on pt and machine. Machine in good working order. PT explained how she was feeling when she opened and closed her mouth. Explained to pt that this is normal and it may take time to get used to wearing CPAP mask. Pt at this time stated she was feeling anxious and nervous about wearing CPAP. RN notified

## 2014-09-02 ENCOUNTER — Inpatient Hospital Stay (HOSPITAL_COMMUNITY): Payer: Medicare Other

## 2014-09-02 DIAGNOSIS — J9600 Acute respiratory failure, unspecified whether with hypoxia or hypercapnia: Secondary | ICD-10-CM | POA: Insufficient documentation

## 2014-09-02 DIAGNOSIS — R0602 Shortness of breath: Secondary | ICD-10-CM

## 2014-09-02 DIAGNOSIS — R140 Abdominal distension (gaseous): Secondary | ICD-10-CM

## 2014-09-02 LAB — BASIC METABOLIC PANEL
Anion gap: 14 (ref 5–15)
BUN: 69 mg/dL — ABNORMAL HIGH (ref 6–20)
CO2: 25 mmol/L (ref 22–32)
Calcium: 9.2 mg/dL (ref 8.9–10.3)
Chloride: 102 mmol/L (ref 101–111)
Creatinine, Ser: 2.46 mg/dL — ABNORMAL HIGH (ref 0.44–1.00)
GFR calc Af Amer: 21 mL/min — ABNORMAL LOW (ref >60)
GFR calc non Af Amer: 18 mL/min — ABNORMAL LOW (ref >60)
Glucose, Bld: 110 mg/dL — ABNORMAL HIGH (ref 65–99)
Potassium: 4 mmol/L (ref 3.5–5.1)
Sodium: 141 mmol/L (ref 135–145)

## 2014-09-02 LAB — BLOOD GAS, ARTERIAL
Acid-Base Excess: 3.2 mmol/L — ABNORMAL HIGH (ref 0.0–2.0)
Acid-Base Excess: 5.2 mmol/L — ABNORMAL HIGH (ref 0.0–2.0)
Acid-Base Excess: 6.1 mmol/L — ABNORMAL HIGH (ref 0.0–2.0)
Bicarbonate: 29 mEq/L — ABNORMAL HIGH (ref 20.0–24.0)
Bicarbonate: 30.2 mEq/L — ABNORMAL HIGH (ref 20.0–24.0)
Bicarbonate: 31.7 mEq/L — ABNORMAL HIGH (ref 20.0–24.0)
Delivery systems: POSITIVE
Delivery systems: POSITIVE
Drawn by: 235321
Drawn by: 225631
Expiratory PAP: 10
Expiratory PAP: 17.5
Mode: POSITIVE
O2 Content: 6 L/min
O2 Content: 6 L/min
O2 Content: 5 L/min
O2 Saturation: 90.6 %
O2 Saturation: 84 %
O2 Saturation: 90.3 %
Patient temperature: 98.6
Patient temperature: 98.6
Patient temperature: 98.6
RATE: 20 resp/min
TCO2: 30.9 mmol/L (ref 0–100)
TCO2: 31.9 mmol/L (ref 0–100)
TCO2: 33.6 mmol/L (ref 0–100)
pCO2 arterial: 60.2 mmHg (ref 35.0–45.0)
pCO2 arterial: 53.4 mmHg — ABNORMAL HIGH (ref 35.0–45.0)
pCO2 arterial: 60.5 mmHg (ref 35.0–45.0)
pH, Arterial: 7.305 — ABNORMAL LOW (ref 7.350–7.450)
pH, Arterial: 7.372 (ref 7.350–7.450)
pH, Arterial: 7.34 — ABNORMAL LOW (ref 7.350–7.450)
pO2, Arterial: 58.4 mmHg — ABNORMAL LOW (ref 80.0–100.0)
pO2, Arterial: 49.1 mmHg — ABNORMAL LOW (ref 80.0–100.0)
pO2, Arterial: 59.7 mmHg — ABNORMAL LOW (ref 80.0–100.0)

## 2014-09-02 LAB — PROTEIN, BODY FLUID: Total protein, fluid: <3 g/dL

## 2014-09-02 LAB — CBC
HCT: 28.3 % — ABNORMAL LOW (ref 36.0–46.0)
Hemoglobin: 8.8 g/dL — ABNORMAL LOW (ref 12.0–15.0)
MCH: 27.8 pg (ref 26.0–34.0)
MCHC: 31.1 g/dL (ref 30.0–36.0)
MCV: 89.6 fL (ref 78.0–100.0)
Platelets: 224 10*3/uL (ref 150–400)
RBC: 3.16 MIL/uL — ABNORMAL LOW (ref 3.87–5.11)
RDW: 15.5 % (ref 11.5–15.5)
WBC: 8 10*3/uL (ref 4.0–10.5)

## 2014-09-02 LAB — GLUCOSE, CAPILLARY
Glucose-Capillary: 113 mg/dL — ABNORMAL HIGH (ref 65–99)
Glucose-Capillary: 125 mg/dL — ABNORMAL HIGH (ref 65–99)
Glucose-Capillary: 138 mg/dL — ABNORMAL HIGH (ref 65–99)
Glucose-Capillary: 136 mg/dL — ABNORMAL HIGH (ref 65–99)

## 2014-09-02 LAB — GLUCOSE, SEROUS FLUID: Glucose, Fluid: 144 mg/dL

## 2014-09-02 LAB — BODY FLUID CELL COUNT WITH DIFFERENTIAL
Eos, Fluid: 0 %
Lymphs, Fluid: 5 %
Monocyte-Macrophage-Serous Fluid: 90 % (ref 50–90)
Neutrophil Count, Fluid: 5 % (ref 0–25)
Total Nucleated Cell Count, Fluid: 197 cu mm (ref 0–1000)

## 2014-09-02 LAB — MRSA PCR SCREENING: MRSA by PCR: NEGATIVE

## 2014-09-02 LAB — LACTATE DEHYDROGENASE, PLEURAL OR PERITONEAL FLUID: LD, Fluid: 47 U/L — ABNORMAL HIGH (ref 3–23)

## 2014-09-02 MED ORDER — LIDOCAINE HCL (PF) 1 % IJ SOLN
INTRAMUSCULAR | Status: AC
  Filled 2014-09-02: qty 10

## 2014-09-02 MED ORDER — LORAZEPAM 0.5 MG PO TABS
0.5000 mg | ORAL_TABLET | Freq: Four times a day (QID) | ORAL | Status: DC | PRN
  Administered 2014-09-02 – 2014-09-03 (×3): 0.5 mg via ORAL
  Filled 2014-09-02 (×3): qty 1

## 2014-09-02 NOTE — Progress Notes (Signed)
Paged Dr. Ree Kida regarding Pt newest ABG results. Dr. Ree Kida came to bedside and wishes to transfer pt at this time. Orders received. Called Daughter Caren Griffins to inform her pt will not be on 3E when she comes to visit. Will continue to monitor pt.       Maurene Capes RN

## 2014-09-02 NOTE — Progress Notes (Signed)
Patient was noted on camera to be lying diagonal across bed approx 0530.  RN went into patient's room-patient was unresponsive. Patient has been noncompliant with leaving CPAP mask on and/or notifying staff when taking mask off so nasal cannula can be replaced.  CPAP was noted to be lying on the floor when RN went into patient's room. Staff emergency call. Patient placed on Zoll monitor (sinus rhythm 60's), NRB applied and she was repositioned in bed while Rapid Response, Respiratory and MD on call were notified.  Patient's initial SpO2 was noted to be 62% initially, BP 99/77. Once patient's SpO2 started to rise, patient began to become more alert and able to follow simple commands.  Respiratory then placed patient on CPAP. Patient SpO2 rose into mid-high 90's. ABG ordered. Will continue to monitor. Tresa Endo

## 2014-09-02 NOTE — Progress Notes (Signed)
RT note: Pt. transported without incident from 3E-18 to 3W-03 on NRB mask @ 15 lpm with 95% sats, was previously on CPAP auto on 3E with (<P-10/>P-20), Pressure has been reading from 16-19 cmH20, added sterile H20 to CPAP this a.m. for humidity due to c/o, Oxygen was found on 6 lpm upon my arrival this a.m. when arrived for 0730 scheduled ABG, currently using Nasal mask w/o difficulty with CPAP, ABG results improved with P02 & 02 saturation slightly <'d, scheduled BID nebulizer tx. given with Oxygen prior to transport, noted CPAP auto pressures have >'d from 16.0 to 19.5 cmH20, Labs show >'d BUN, Creatinine, ? BNP/<'d Hemoglobin, RT receiving pt. made aware, RT @ bedside, plan to place back on CPAP and monitor.

## 2014-09-02 NOTE — Progress Notes (Signed)
[facility]                                                                              Patient Demographics  Doris Howell, is a 79 y.o. female, DOB - 1935-06-09, [id number]  Admit date - 08/28/2014   Admitting Physician Bonnielee Haff, MD  Outpatient Primary MD for the patient is Irven Shelling, MD  LOS - 5   Chief Complaint  Patient presents with  . Congestive Heart Failure  . Shortness of Breath      Interim history  79 year old female with history of diastolic heart failure, hypertension, diabetes presented with shortness of breath. Patient did have a scan Myoview in January showing EF of 44% without ischemia. Patient admitted for dyspnea secondary to CHF exacerbation versus COPD exacerbation. Improving very slowly. Overnight, patient became hypoxic and was noted to have acidosis with hypoxia and hypercapnia on ABG. Patient had pulled off her CPAP. She was then placed on a BiPAP.  Patient improved slightly, however was then transferred to stepdown. Cardiology is following.  Assessment & Plan   Acute hypoxic respiratory failure/pleural effusions -overnight patient pulled off CPAP and was found unresponsive -Patient was placed on BiPAP, ABG showed mild acidosis with hypoxia and hypercarbia -Repeat ABG was improved however given patient's ability to pull off mass, patient was transferred to stepdown -Repeat CXR: Right greater than left mid and upper lung pulmonary consolidation which may represent multifocal pneumonia, hemorrhage, infarct or more consolidation due to edema -Continue diuresis -Will ask IR for possible thoracentesis? -Unlikely pneumonia, patient afebrile no leukocytosis  Dyspnea secondary to Aute diastolic congestive heart failure vs Acute COPD exac -Echocardiogram in April 2016 shows EF [id number], grade 2 diastolic dysfunction -Monitor intake and output, daily weights -No ACE inhibitor due to chronic kidney disease -Patient currently on IV  Lasix- 80mg  IV BID -Cardiology consulted and appreciated -Down 5 pounds since admission? -UO in past 24hrs 1556ml -Patient has no history of smoking, but is exposed second hand -Continue neb treatments -Trial of prednisone, no improvement -Patient continues to feel SOB with exertion -PT/OT consulted, recommended HH  -CXR recurrent moderate CHF -Repeat CXR: Right greater than left mid and upper lung pulmonary consolidation which may represent multifocal pneumonia, hemorrhage, infarct or more consolidation due to edema -Cardiology- possible cath in 24-48hrs if symptoms do not improve  Chronic kidney disease, stage IV -Monitor creatinine intake and output closely due to diuresis -Baseline creatinine appears to be 2.2, currently 2.46 -GFR still maintaining in stage 4 -Renal US: chronic disease, no hydronephrosis -Spoke with Dr. Justin Mend, recommended PTH level and aranesp injection, with outpatient follow-up -Aranesp injection given on 09/01/2014  Diabetes mellitus, type II -Hemaglobin A1c 6.6 -Continue insulin sliding-scale CBG monitoring  Obstructive sleep apnea -Patient states she does not use a CPAP at home- she stated she has not bought it yet due to financial reasons -Patient was refusing CPAP during hospitalization -Spoke with Respiratory regarding smaller mask -Will order ativan QHS PRN as patient has anxiety with the mask  Hypertension -Stable, Continue diuresis and amlodipine  Gout -Continue allopurinol  Normocytic anemia/Chronic disease -Baseline hemoglobin appears to be approximately 8, currently 8.8 -Continue to monitor CBC -Iron in Jan 2016,  107 -Will provide aranesp injection  Abdominal distention -Likely from BiPAP -Patient has had BMs -Abd Xray: Nonobstructed bowel gas pattern  Code Status: Full  Family Communication: None at bedside  Disposition Plan: Admitted.  Patient notes stepdown due to hypoxic respiratory failure and closer monitoring.  Time Spent  in minutes   30 minutes  Procedures  None  Consults   Cardiology Nephrology, Dr. Justin Mend, via phone  DVT Prophylaxis  heparin  Lab Results  Component Value Date   PLT 224 09/02/2014    Medications  Scheduled Meds: . allopurinol  100 mg Oral BID  . amLODipine  10 mg Oral q morning - 10a  . aspirin EC  81 mg Oral Daily  . carvedilol  12.5 mg Oral BID WC  . furosemide  80 mg Intravenous Q12H  . heparin  5,000 Units Subcutaneous 3 times per day  . hydrALAZINE  10 mg Oral BID  . insulin aspart  0-9 Units Subcutaneous TID WC  . ipratropium-albuterol  3 mL Nebulization BID  . isosorbide mononitrate  30 mg Oral Daily  . latanoprost  1 drop Both Eyes QHS  . polyethylene glycol  17 g Oral Daily  . sodium chloride  3 mL Intravenous Q12H   Continuous Infusions:  PRN Meds:.sodium chloride, acetaminophen, albuterol, bisacodyl, docusate sodium, LORazepam, ondansetron (ZOFRAN) IV, sodium chloride  Antibiotics    Anti-infectives    None      Subjective:   Doris Howell seen and examined today.  Overnight, patient pulled off mask and became unresponsive. ABG showed hypoxia. Patient placed on BiPAP. Currently continues to be agitated by BiPAP mask and would like to have it taken off. Denies any chest pain, abdominal pain. States she had a bowel movement. Denies any nausea or vomiting at this time.  Objective:   Filed Vitals:   09/02/14 0730 09/02/14 0915 09/02/14 0938 09/02/14 1114  BP:   133/63   Pulse:  62 53   Temp:    97.5 F (36.4 C)  TempSrc:    Axillary  Resp:  20 20   Height:      Weight:      SpO2: 90% 92% 96%     Wt Readings from Last 3 Encounters:  09/02/14 86.1 kg (189 lb 13.1 oz)  08/12/14 85.095 kg (187 lb 9.6 oz)  08/02/14 83.598 kg (184 lb 4.8 oz)     Intake/Output Summary (Last 24 hours) at 09/02/14 1122 Last data filed at 09/02/14 [id number]  Gross per 24 hour  Intake    720 ml  Output   1850 ml  Net  -1130 ml    Exam  General: Well developed,  well nourished, No distress  HEENT: NCAT, mucous membranes moist.   Cardiovascular: S1 S2 auscultated, RRR, no murmurs  Respiratory: Prolonged expiratory phase, diminished breath sounds- more at bases, no crackles or rhonchi  Abdomen: Soft, nontender, distended, + bowel sounds  Extremities: warm dry without cyanosis clubbing. Trace Ankle edema B/L  Neuro: AAOx3, nonfocal  Psych: Pleasant, normal affect and mood  Data Review   Micro Results Recent Results (from the past 240 hour(s))  Culture, Urine     Status: None   Collection Time: 08/29/14  6:48 PM  Result Value Ref Range Status   Specimen Description URINE, CATHETERIZED  Final   Special Requests NONE  Final   Colony Count   Final    >=100,000 COLONIES/ML Performed at [facility]   Final  ESCHERICHIA COLI Performed at [facility]    Report Status 08/31/2014 FINAL  Final   Organism ID, Bacteria ESCHERICHIA COLI  Final      Susceptibility   Escherichia coli - MIC*    AMPICILLIN >=32 RESISTANT Resistant     CEFAZOLIN <=4 SENSITIVE Sensitive     CEFTRIAXONE <=1 SENSITIVE Sensitive     CIPROFLOXACIN >=4 RESISTANT Resistant     GENTAMICIN <=1 SENSITIVE Sensitive     LEVOFLOXACIN >=8 RESISTANT Resistant     NITROFURANTOIN <=16 SENSITIVE Sensitive     TOBRAMYCIN <=1 SENSITIVE Sensitive     TRIMETH/SULFA <=20 SENSITIVE Sensitive     PIP/TAZO <=4 SENSITIVE Sensitive     * ESCHERICHIA COLI    Radiology Reports Dg Chest 2 View  09/01/2014   CLINICAL DATA:  Shortness of breath, congestive heart failure  EXAM: CHEST  2 VIEW  COMPARISON:  08/18/2014  FINDINGS: Slight interval improvement in aeration with moderate to marked cardiomegaly and central vascular congestion reidentified. Increased moderate size pleural effusions are noted. Patchy perihilar airspace opacities are noted.  IMPRESSION: Improved aeration however increased moderate pleural effusions and patchy perihilar airspace opacities are  identified likely indicating worsening edema. Other alveolar filling processes could appear similar.   Electronically Signed   By: Conchita Paris M.D.   On: 09/01/2014 13:02   US Renal  08/31/2014   CLINICAL DATA:  Patient with acute renal failure. Prior left nephrectomy.  EXAM: RENAL / URINARY TRACT ULTRASOUND COMPLETE  COMPARISON:  CT 04/05/2014  FINDINGS: Right Kidney:  Length: 11.5 cm. Diffusely increased renal parenchymal echogenicity. No hydronephrosis.  Left Kidney:  Surgically absent.  Bladder:  Appears normal for degree of bladder distention.  IMPRESSION: Increased renal parenchymal echogenicity suggestive of chronic medical renal disease. No hydronephrosis.   Electronically Signed   By: Lovey Newcomer M.D.   On: 08/31/2014 21:41   Dg Chest Port 1 View  09/02/2014   CLINICAL DATA:  Patient with shortness of breath.  EXAM: PORTABLE CHEST - 1 VIEW  COMPARISON:  Chest radiograph 09/01/2014  FINDINGS: Monitoring leads overlie the patient. Stable cardiomegaly. Interval development of large right greater than left mid and upper lung consolidative pulmonary opacities. Small to moderate layering bilateral pleural effusions with underlying opacities suggestive of atelectasis. Bilateral predominately perihilar interstitial pulmonary opacities.  IMPRESSION: Interval development right-greater-than-left mid and upper lung pulmonary consolidation which may represent multi focal pneumonia, hemorrhage, infarct or more focal areas of consolidation due to edema.  Cardiomegaly with interstitial edema.  Layering bilateral pleural effusions with underlying atelectasis.   Electronically Signed   By: Lovey Newcomer M.D.   On: 09/02/2014 09:05   Dg Chest Port 1 View  08/28/2014   CLINICAL DATA:  Congestive heart failure.  Shortness of breath.  EXAM: PORTABLE CHEST - 1 VIEW  COMPARISON:  07/29/2014.  FINDINGS: Cardiopericardial silhouette is partially obscured by effusions, edema and atelectasis. Cardiomegaly remains present.  Aortic arch atherosclerosis. Interstitial and airspace opacity with basilar predominance compatible with pulmonary edema. Bilateral pleural effusions.  IMPRESSION: Recurrent moderate CHF.   Electronically Signed   By: Dereck Ligas M.D.   On: 08/28/2014 13:02   Dg Abd 2 Views  09/02/2014   CLINICAL DATA:  Patient with abdominal distension.  EXAM: ABDOMEN - 2 VIEW  COMPARISON:  CT 04/05/2014  FINDINGS: Gas is demonstrated within nondilated loops of large and small bowel. No evidence for bowel obstruction. Decubitus images demonstrate no free intraperitoneal air. Surgical clips left mid abdomen. Cardiomegaly. Bibasilar  heterogeneous opacities. Calcifications in left renal fossa.  IMPRESSION: Nonobstructed bowel gas pattern.  Cardiomegaly.  Basilar heterogeneous opacities compatible with known pleural effusions possible atelectasis.   Electronically Signed   By: Lovey Newcomer M.D.   On: 09/02/2014 09:50    CBC  Recent Labs Lab 08/28/14 1204  08/29/14 0531 08/30/14 0320 08/31/14 0559 09/01/14 0454 09/02/14 0318  WBC 6.4  --  5.7 5.6 8.1 9.9 8.0  HGB 8.9*  < > 8.4* 8.8* 8.3* 8.7* 8.8*  HCT 28.0*  < > 26.2* 26.4* 26.0* 26.5* 28.3*  PLT 279  --  262 241 241 250 224  MCV 88.3  --  89.1 89.2 88.7 88.6 89.6  MCH 28.1  --  28.6 29.7 28.3 29.1 27.8  MCHC 31.8  --  32.1 33.3 31.9 32.8 31.1  RDW 15.4  --  15.4 15.5 15.5 15.5 15.5  LYMPHSABS 0.7  --   --   --   --   --   --   MONOABS 0.7  --   --   --   --   --   --   EOSABS 0.2  --   --   --   --   --   --   BASOSABS 0.0  --   --   --   --   --   --   < > = values in this interval not displayed.  Chemistries   Recent Labs Lab 08/28/14 1204  08/29/14 0531 08/30/14 0320 08/31/14 0559 09/01/14 0454 09/02/14 0318  NA 142  < > 141 142 140 140 141  K 3.6  < > 3.5 3.8 3.8 3.8 4.0  CL 105  < > 104 103 103 103 102  CO2 25  --  29 26 27 28 25   GLUCOSE 92  < > 105* 196* 119* 66 110*  BUN 39*  < > 41* 46* 59* 64* 69*  CREATININE 2.13*  < > 2.32*  2.55* 2.78* 2.77* 2.46*  CALCIUM 9.5  --  9.5 9.3 9.0 9.0 9.2  AST 31  --   --   --   --   --   --   ALT 23  --   --   --   --   --   --   ALKPHOS 101  --   --   --   --   --   --   BILITOT 0.6  --   --   --   --   --   --   < > = values in this interval not displayed. ------------------------------------------------------------------------------------------------------------------ estimated creatinine clearance is 19.1 mL/min (by C-G formula based on Cr of 2.46). ------------------------------------------------------------------------------------------------------------------ No results for input(s): HGBA1C in the last 72 hours. ------------------------------------------------------------------------------------------------------------------ No results for input(s): CHOL, HDL, LDLCALC, TRIG, CHOLHDL, LDLDIRECT in the last 72 hours. ------------------------------------------------------------------------------------------------------------------ No results for input(s): TSH, T4TOTAL, T3FREE, THYROIDAB in the last 72 hours.  Invalid input(s): FREET3 ------------------------------------------------------------------------------------------------------------------ No results for input(s): VITAMINB12, FOLATE, FERRITIN, TIBC, IRON, RETICCTPCT in the last 72 hours.  Coagulation profile No results for input(s): INR, PROTIME in the last 168 hours.  No results for input(s): DDIMER in the last 72 hours.  Cardiac Enzymes  Recent Labs Lab 08/28/14 2000 08/29/14 0001 08/29/14 0531  TROPONINI 0.03 0.03 0.03   ------------------------------------------------------------------------------------------------------------------ Invalid input(s): POCBNP    Jaidy Cottam D.O. on 09/02/2014 at 11:22 AM  Between 7am to 7pm - Pager - [phone]  After 7pm go to [email or web address] -  password [email or web address]  And look for the night coverage person covering for me after hours  [facility] Office   [phone]

## 2014-09-02 NOTE — Procedures (Signed)
Successful US guided Right thoracentesis. Yielded 400 mls of clear yellow fluid. Pt tolerated procedure well. No immediate complications.  Specimen was sent for labs. CXR ordered.  Gareth Eagle R PA-C 09/02/2014 3:29 PM

## 2014-09-02 NOTE — Progress Notes (Signed)
Patient Name: Doris Howell Date of Encounter: 09/02/2014  Principal Problem:   Acute on chronic combined systolic and diastolic CHF (congestive heart failure) Active Problems:   Type 2 diabetes mellitus with stage 4 chronic kidney disease   SOB (shortness of breath)   Bradycardia- beta blocker decreased   Sleep apnea   Chronic kidney disease (CKD), stage IV (severe)   COPD with exacerbation   Acute on chronic renal failure    SUBJECTIVE  Patient stated that she CPAP fall off when she was trying to get up and go to bathroom. She was unable to find CPAP and next thing she remembers that she woke up in a bed. However nurse note indicated that she in noncompliant with leaving CPAP and found unresponsive in bed with SpO2 62% and CPAP on floor. Currently she denies chest pain or palpitation. SOB stable. Plan to transfer her to stepdown.   CURRENT MEDS . allopurinol  100 mg Oral BID  . amLODipine  10 mg Oral q morning - 10a  . aspirin EC  81 mg Oral Daily  . carvedilol  12.5 mg Oral BID WC  . furosemide  80 mg Intravenous Q12H  . heparin  5,000 Units Subcutaneous 3 times per day  . hydrALAZINE  10 mg Oral BID  . insulin aspart  0-9 Units Subcutaneous TID WC  . ipratropium-albuterol  3 mL Nebulization BID  . isosorbide mononitrate  30 mg Oral Daily  . latanoprost  1 drop Both Eyes QHS  . polyethylene glycol  17 g Oral Daily  . sodium chloride  3 mL Intravenous Q12H    OBJECTIVE  Filed Vitals:   09/02/14 0727 09/02/14 0730 09/02/14 0915 09/02/14 0938  BP: 133/56   133/63  Pulse: 64  62 53  Temp:      TempSrc:      Resp: 22  20 20   Height:      Weight:      SpO2: 92% 90% 92% 96%    Intake/Output Summary (Last 24 hours) at 09/02/14 0949 Last data filed at 09/02/14 [id number]  Gross per 24 hour  Intake    720 ml  Output   1850 ml  Net  -1130 ml   Filed Weights   08/31/14 0654 09/01/14 0655 09/02/14 0600  Weight: 192 lb 9.6 oz (87.363 kg) 193 lb 9.6 oz (87.816 kg)  189 lb 13.1 oz (86.1 kg)    PHYSICAL EXAM  General: Pleasant, NAD. On CPAP. Neuro: Alert and oriented X 3. Moves all extremities spontaneously. Psych: Normal affect. HEENT:  Normal  Neck: Supple without bruits. JVD difficult to access due to girth.  Lungs: Respiration very distant with diminished breath sound in the bases. No audible crackles or wheezing.  Heart: RRR no s3, s4, or murmurs. Abdomen: Soft, non-tender, BS + x 4. Abdomen is distended. Extremities: No clubbing, cyanosis. Trace ankle edema. DP/PT/Radials 2+ and equal bilaterally.  Accessory Clinical Findings  CBC  Recent Labs  09/01/14 0454 09/02/14 0318  WBC 9.9 8.0  HGB 8.7* 8.8*  HCT 26.5* 28.3*  MCV 88.6 89.6  PLT 250 [id number]   Basic Metabolic Panel  Recent Labs  09/01/14 0454 09/02/14 0318  NA 140 141  K 3.8 4.0  CL 103 102  CO2 28 25  GLUCOSE 66 110*  BUN 64* 69*  CREATININE 2.77* 2.46*  CALCIUM 9.0 9.2     TELE  On tele this AM after found unresponsive. Sinus brady with rates of 40-60s.  Radiology/Studies  Dg Chest 2 View  09/01/2014   CLINICAL DATA:  Shortness of breath, congestive heart failure  EXAM: CHEST  2 VIEW  COMPARISON:  08/18/2014  FINDINGS: Slight interval improvement in aeration with moderate to marked cardiomegaly and central vascular congestion reidentified. Increased moderate size pleural effusions are noted. Patchy perihilar airspace opacities are noted.  IMPRESSION: Improved aeration however increased moderate pleural effusions and patchy perihilar airspace opacities are identified likely indicating worsening edema. Other alveolar filling processes could appear similar.   Electronically Signed   By: Conchita Paris M.D.   On: 09/01/2014 13:02   US Renal  08/31/2014   CLINICAL DATA:  Patient with acute renal failure. Prior left nephrectomy.  EXAM: RENAL / URINARY TRACT ULTRASOUND COMPLETE  COMPARISON:  CT 04/05/2014  FINDINGS: Right Kidney:  Length: 11.5 cm. Diffusely increased  renal parenchymal echogenicity. No hydronephrosis.  Left Kidney:  Surgically absent.  Bladder:  Appears normal for degree of bladder distention.  IMPRESSION: Increased renal parenchymal echogenicity suggestive of chronic medical renal disease. No hydronephrosis.   Electronically Signed   By: Lovey Newcomer M.D.   On: 08/31/2014 21:41   Dg Chest Port 1 View  09/02/2014   CLINICAL DATA:  Patient with shortness of breath.  EXAM: PORTABLE CHEST - 1 VIEW  COMPARISON:  Chest radiograph 09/01/2014  FINDINGS: Monitoring leads overlie the patient. Stable cardiomegaly. Interval development of large right greater than left mid and upper lung consolidative pulmonary opacities. Small to moderate layering bilateral pleural effusions with underlying opacities suggestive of atelectasis. Bilateral predominately perihilar interstitial pulmonary opacities.  IMPRESSION: Interval development right-greater-than-left mid and upper lung pulmonary consolidation which may represent multi focal pneumonia, hemorrhage, infarct or more focal areas of consolidation due to edema.  Cardiomegaly with interstitial edema.  Layering bilateral pleural effusions with underlying atelectasis.   Electronically Signed   By: Lovey Newcomer M.D.   On: 09/02/2014 09:05   Dg Chest Port 1 View  08/28/2014   CLINICAL DATA:  Congestive heart failure.  Shortness of breath.  EXAM: PORTABLE CHEST - 1 VIEW  COMPARISON:  07/29/2014.  FINDINGS: Cardiopericardial silhouette is partially obscured by effusions, edema and atelectasis. Cardiomegaly remains present. Aortic arch atherosclerosis. Interstitial and airspace opacity with basilar predominance compatible with pulmonary edema. Bilateral pleural effusions.  IMPRESSION: Recurrent moderate CHF.   Electronically Signed   By: Dereck Ligas M.D.   On: 08/28/2014 13:02    ASSESSMENT AND PLAN  1. Acute on chronic combined systolic and diastolic heart failure:  - Dyspnea stable to CPAP. - Lasix increased to 80mg  BID  yesterday. Net I/O now negative 71mL over the last 24 hours and since admission negative 1.02 L. Lost 5 lb since admission (now 189Lb, 185 4/23, 187 on 5/3 office f/u). Still with mild volume excess on exam. Continue current dose.  - Renal function improved to 2.46 from 2.77 yesterday. Plan to f/u with Dr. Justin Mend outpatient.  - CXR showed today Interval development right-greater-than-left mid and upper lung pulmonary consolidation which may represent multi focal pneumonia, hemorrhage, infarct or more focal areas of consolidation due to edema.  Cardiomegaly with interstitial edema.  Layering bilateral pleural effusions with underlying atelectasis. - Cont bb, hydral/nitrate. No acei/arb/arni/spiro.  2. Acute on chronic kidney disease stage IV: per above, renal function improved. Continue Lasix 80 mg BID for now.  3. HTN: stable and well-controlled. Cont bb, hydral/nitrate/ccb.  4. DM II: currently well controlled.  5. OSA: On CAPA. Compliance is issue.  6. Morbid obesity:  advised weight reduction and life style change.   7. Normocytic anemia:Stable.   8. Respiratory Distress. - PT found unresponsive this AM with SpO2 62% and CPAP on floor. SpO2 improved now. Patient is awake and alert. Tele showed sinus bradycardia. Plan to transfer to stepdown. Denies CP or palpitation. On CPAP.   Signed, Bhagat,Bhavinkumar PA-C Pager [phone] As above, patient found unresponsive earlier today after being off of CPAP. She has now improved. She denies chest pain. She remains dyspneic. Chest x-ray yesterday showed pleural effusions. Plan to continue diuresis with Lasix 80 mg IV twice a day. Follow renal function. She may require thoracentesis. If symptoms do not improve in next 24-48 hours plan right heart catheterization to evaluate filling pressures. Kirk Ruths

## 2014-09-02 NOTE — Progress Notes (Signed)
Report taken from [facility]. Pt is lethargic but AOx4 and on CPAP and ZOLL at this moment. O2 saturations at 90 %  ABG taken now. Will continue to monitor.

## 2014-09-02 NOTE — Progress Notes (Signed)
Pt transferred to ultrasound with RN at bedside for thoracentesis. Pt was on Venti Mask,  pt tolerated well. Once we arrived back to the floor, RN placed pt back on CPAP, asked respiratory to come assess. Pt sats 95%, pt currently tolerating CPAP well. Etta Quill, RN

## 2014-09-02 NOTE — Progress Notes (Signed)
Report called to 3W oncoming RN.

## 2014-09-02 NOTE — Progress Notes (Signed)
Shift event note:  Notified by RN regarding pt found "nearly unresponsive" lying across her bed. Pt last seen normal approx 1 hour prior. Apparently pt removed CPAP mask w/o notifying staff so that 02 could be applied via [place]. Initial sats in 60's and pt SBP in 90's. RRT placed pt back on CPAP and pt almost immediately began to become more alert and was able to follow commands. At bedside pt now noted awake and oriented x 2 though pt states the CPAP makes her wet the bed. 02 sats 92% on CPAP. BBS diminished R>L but otherwise CTA. ABG obtained revealed a mild acidosis w/ hypercarbia and hypoxia.  Assessment/Plan: 1. Acute hypoxic/hypercarbic respiratory failure: In clinical setting of pt w/acute diastolic CHF vs acute COPD exacerbation and OSA. Related to pulling off CPAP mask and being off supplemental 02 for possibly as long as an hour. Immediate improvement in LOC when CPAP reapplied. Will leave pt on CPAP for 1.5-2 hours and repeat ABG. Rounding MD to f/u to evaluate improved ABG vs need for further intervention.   Jeryl Columbia, NP-C [facility] Pager [phone]

## 2014-09-02 NOTE — Progress Notes (Signed)
Pt sister wishes to be notified if there is any change in the patient status. Daughter states she wishes that no phone calls be directed to the patient. Etta Quill, RN

## 2014-09-02 NOTE — Care Management Note (Signed)
Case Management Note  Patient Details  Name: Doris Howell MRN: [id number] Date of Birth: 1935-12-18  Subjective/Objective:      Admitted with CHF, patient transferred to the SDU/ respiratory issues              Action/Plan: Possibly home with [facility]; patient was active with [facility] for HHRN/ PT; patient will possibly need HHC vs short term SNF at discharge  Expected Discharge Date:  09/08/2014            Expected Discharge Plan:  [facility] vs SNF placement  Discharge planning Services  CM Consult  Status of Service:   In progress  Medicare Important Message Given:  Yes Date Medicare IM Given:  09/01/14 Medicare IM give by:  Mindi Slicker RN   Royston Bake, RN,BSN,MHA [phone] 09/02/2014, 12:39 PM

## 2014-09-02 NOTE — Clinical Documentation Improvement (Signed)
Current chart reflects "Respiratory Distress:  Pt found unresponsive this AM with SpO2 62% and CPAP on floor.  SpO2 improved now.  Patient is awake and alert.  Tele showed sinus bradycardia.  PLan to transfer to stepdown.  Denies CP or palpitation.  On CPAP."  ABGs as below.  Please identify any additional clinical conditions associated with the above events, if any, and document in your progress notes and carry over to the discharge summary.    Component     Latest Ref Rng 09/02/2014 09/02/2014        6:00 AM 7:38 AM  pH, Arterial     7.350 - 7.450 7.305 (L) 7.372  pCO2 arterial     35.0 - 45.0 mmHg 60.2 (HH) 53.4 (H)  pO2, Arterial     80.0 - 100.0 mmHg 58.4 (L) 49.1 (L)  Bicarbonate     20.0 - 24.0 mEq/L 29.0 (H) 30.2 (H)  TCO2     0 - 100 mmol/L 30.9 31.9  Acid-Base Excess     0.0 - 2.0 mmol/L 3.2 (H) 5.2 (H)   Possible Clinical Conditions: -Acute respiratory failure with hypoxia and hypercapnia -Acute respiratory failure, other (please specify) -Respiratory Distress only as currently documented -Other (please specify)  Thank you, Mateo Flow, RN [phone] Clinical Documentation Specialist

## 2014-09-03 LAB — BASIC METABOLIC PANEL
Anion gap: 12 (ref 5–15)
BUN: 66 mg/dL — ABNORMAL HIGH (ref 6–20)
CO2: 28 mmol/L (ref 22–32)
Calcium: 8.9 mg/dL (ref 8.9–10.3)
Chloride: 101 mmol/L (ref 101–111)
Creatinine, Ser: 2.32 mg/dL — ABNORMAL HIGH (ref 0.44–1.00)
GFR calc Af Amer: 22 mL/min — ABNORMAL LOW (ref >60)
GFR calc non Af Amer: 19 mL/min — ABNORMAL LOW (ref >60)
Glucose, Bld: 98 mg/dL (ref 65–99)
Potassium: 3.6 mmol/L (ref 3.5–5.1)
Sodium: 141 mmol/L (ref 135–145)

## 2014-09-03 LAB — CBC
HCT: 26.2 % — ABNORMAL LOW (ref 36.0–46.0)
Hemoglobin: 8.2 g/dL — ABNORMAL LOW (ref 12.0–15.0)
MCH: 27.9 pg (ref 26.0–34.0)
MCHC: 31.3 g/dL (ref 30.0–36.0)
MCV: 89.1 fL (ref 78.0–100.0)
Platelets: 232 10*3/uL (ref 150–400)
RBC: 2.94 MIL/uL — ABNORMAL LOW (ref 3.87–5.11)
RDW: 15.5 % (ref 11.5–15.5)
WBC: 7.8 10*3/uL (ref 4.0–10.5)

## 2014-09-03 LAB — GLUCOSE, CAPILLARY
Glucose-Capillary: 103 mg/dL — ABNORMAL HIGH (ref 65–99)
Glucose-Capillary: 160 mg/dL — ABNORMAL HIGH (ref 65–99)
Glucose-Capillary: 116 mg/dL — ABNORMAL HIGH (ref 65–99)
Glucose-Capillary: 210 mg/dL — ABNORMAL HIGH (ref 65–99)

## 2014-09-03 MED ORDER — CARVEDILOL 6.25 MG PO TABS
6.2500 mg | ORAL_TABLET | Freq: Two times a day (BID) | ORAL | Status: DC
  Administered 2014-09-03 – 2014-09-11 (×16): 6.25 mg via ORAL
  Filled 2014-09-03 (×17): qty 1

## 2014-09-03 MED ORDER — HYDRALAZINE HCL 25 MG PO TABS
25.0000 mg | ORAL_TABLET | Freq: Three times a day (TID) | ORAL | Status: DC
  Administered 2014-09-03 – 2014-09-11 (×23): 25 mg via ORAL
  Filled 2014-09-03 (×25): qty 1

## 2014-09-03 NOTE — Progress Notes (Signed)
Patient Name: Doris Howell Date of Encounter: 09/03/2014  Principal Problem:   Acute on chronic combined systolic and diastolic CHF (congestive heart failure) Active Problems:   Type 2 diabetes mellitus with stage 4 chronic kidney disease   SOB (shortness of breath)   Bradycardia- beta blocker decreased   Sleep apnea   Chronic kidney disease (CKD), stage IV (severe)   COPD with exacerbation   Acute on chronic renal failure   Abdominal distention   Shortness of breath   Acute respiratory failure, unspecified whether with hypoxia or hypercapnia  SUBJECTIVE  The patient was sleeping when I went to the patient's room. Appears fatigue. Denied CP or palpitation. Skope few words and again back to sleep.   CURRENT MEDS . allopurinol  100 mg Oral BID  . amLODipine  10 mg Oral q morning - 10a  . aspirin EC  81 mg Oral Daily  . carvedilol  12.5 mg Oral BID WC  . furosemide  80 mg Intravenous Q12H  . heparin  5,000 Units Subcutaneous 3 times per day  . hydrALAZINE  10 mg Oral BID  . insulin aspart  0-9 Units Subcutaneous TID WC  . ipratropium-albuterol  3 mL Nebulization BID  . isosorbide mononitrate  30 mg Oral Daily  . latanoprost  1 drop Both Eyes QHS  . polyethylene glycol  17 g Oral Daily  . sodium chloride  3 mL Intravenous Q12H    OBJECTIVE  Filed Vitals:   09/02/14 2342 09/02/14 2356 09/03/14 0347 09/03/14 0400  BP: 128/48  137/69   Pulse:      Temp:  98.4 F (36.9 C)  98.7 F (37.1 C)  TempSrc:  Oral  Oral  Resp:      Height:      Weight:    190 lb 11.2 oz (86.5 kg)  SpO2:    94%    Intake/Output Summary (Last 24 hours) at 09/03/14 0741 Last data filed at 09/03/14 0400  Gross per 24 hour  Intake    960 ml  Output   1850 ml  Net   -890 ml   Filed Weights   09/01/14 0655 09/02/14 0600 09/03/14 0400  Weight: 193 lb 9.6 oz (87.816 kg) 189 lb 13.1 oz (86.1 kg) 190 lb 11.2 oz (86.5 kg)    PHYSICAL EXAM  General: Pleasant, obese woman NAD.  Appears  tired and sleepy.  Neuro: Alert and oriented X 3. Moves all extremities spontaneously. Psych: Normal affect. HEENT:  Normal  Neck: Supple without bruits. JVD difficult to access due to girth.  Lungs:  Resp regular, distant and unlabored. Nasal cannula in placed. Scattered rhonchi.  Heart: RRR no s3, s4, or murmurs. Abdomen: Soft, non-tender, non-distended, BS + x 4.  Extremities: No clubbing, cyanosis or edema. DP/PT/Radials 2+ and equal bilaterally.  Accessory Clinical Findings  CBC  Recent Labs  09/02/14 0318 09/03/14 0604  WBC 8.0 7.8  HGB 8.8* 8.2*  HCT 28.3* 26.2*  MCV 89.6 89.1  PLT 224 [id number]   Basic Metabolic Panel  Recent Labs  09/02/14 0318 09/03/14 0604  NA 141 141  K 4.0 3.6  CL 102 101  CO2 25 28  GLUCOSE 110* 98  BUN 69* 66*  CREATININE 2.46* 2.32*  CALCIUM 9.2 8.9    TELE  Sinus rhythm. Occasional PVCs and ventricular bigeminy.  Radiology/Studies  Dg Chest 1 View  09/02/2014   CLINICAL DATA:  Right pleural effusion status post thoracentesis.  EXAM: CHEST  1 VIEW  COMPARISON:  Chest radiograph 09/02/2014  FINDINGS: Monitoring leads overlie the patient. Stable cardiomegaly. Grossly unchanged right-greater-than-left mid and upper lung pulmonary consolidative opacities. Persistent small layering left pleural effusion with underlying pulmonary consolidation suggestive of associated atelectasis. Bilateral interstitial pulmonary opacities. Interval decrease in size of right pleural effusion. No definite pneumothorax.  IMPRESSION: Persistent right-greater-than-left mid and upper lung airspace opacities potentially secondary to multi focal pneumonia.  Slight interval improvement interstitial pulmonary edema. Cardiomegaly.  Persistent small left layering pleural effusion.  Interval decrease in size right pleural effusion.   Electronically Signed   By: Lovey Newcomer M.D.   On: 09/02/2014 15:44   Dg Chest 2 View  09/01/2014   CLINICAL DATA:  Shortness of breath,  congestive heart failure  EXAM: CHEST  2 VIEW  COMPARISON:  08/18/2014  FINDINGS: Slight interval improvement in aeration with moderate to marked cardiomegaly and central vascular congestion reidentified. Increased moderate size pleural effusions are noted. Patchy perihilar airspace opacities are noted.  IMPRESSION: Improved aeration however increased moderate pleural effusions and patchy perihilar airspace opacities are identified likely indicating worsening edema. Other alveolar filling processes could appear similar.   Electronically Signed   By: Conchita Paris M.D.   On: 09/01/2014 13:02   US Renal  08/31/2014   CLINICAL DATA:  Patient with acute renal failure. Prior left nephrectomy.  EXAM: RENAL / URINARY TRACT ULTRASOUND COMPLETE  COMPARISON:  CT 04/05/2014  FINDINGS: Right Kidney:  Length: 11.5 cm. Diffusely increased renal parenchymal echogenicity. No hydronephrosis.  Left Kidney:  Surgically absent.  Bladder:  Appears normal for degree of bladder distention.  IMPRESSION: Increased renal parenchymal echogenicity suggestive of chronic medical renal disease. No hydronephrosis.   Electronically Signed   By: Lovey Newcomer M.D.   On: 08/31/2014 21:41   Dg Chest Port 1 View  09/02/2014   CLINICAL DATA:  Patient with shortness of breath.  EXAM: PORTABLE CHEST - 1 VIEW  COMPARISON:  Chest radiograph 09/01/2014  FINDINGS: Monitoring leads overlie the patient. Stable cardiomegaly. Interval development of large right greater than left mid and upper lung consolidative pulmonary opacities. Small to moderate layering bilateral pleural effusions with underlying opacities suggestive of atelectasis. Bilateral predominately perihilar interstitial pulmonary opacities.  IMPRESSION: Interval development right-greater-than-left mid and upper lung pulmonary consolidation which may represent multi focal pneumonia, hemorrhage, infarct or more focal areas of consolidation due to edema.  Cardiomegaly with interstitial edema.   Layering bilateral pleural effusions with underlying atelectasis.   Electronically Signed   By: Lovey Newcomer M.D.   On: 09/02/2014 09:05   Dg Chest Port 1 View  08/28/2014   CLINICAL DATA:  Congestive heart failure.  Shortness of breath.  EXAM: PORTABLE CHEST - 1 VIEW  COMPARISON:  07/29/2014.  FINDINGS: Cardiopericardial silhouette is partially obscured by effusions, edema and atelectasis. Cardiomegaly remains present. Aortic arch atherosclerosis. Interstitial and airspace opacity with basilar predominance compatible with pulmonary edema. Bilateral pleural effusions.  IMPRESSION: Recurrent moderate CHF.   Electronically Signed   By: Dereck Ligas M.D.   On: 08/28/2014 13:02   Dg Abd 2 Views  09/02/2014   CLINICAL DATA:  Patient with abdominal distension.  EXAM: ABDOMEN - 2 VIEW  COMPARISON:  CT 04/05/2014  FINDINGS: Gas is demonstrated within nondilated loops of large and small bowel. No evidence for bowel obstruction. Decubitus images demonstrate no free intraperitoneal air. Surgical clips left mid abdomen. Cardiomegaly. Bibasilar heterogeneous opacities. Calcifications in left renal fossa.  IMPRESSION: Nonobstructed bowel gas pattern.  Cardiomegaly.  Basilar heterogeneous  opacities compatible with known pleural effusions possible atelectasis.   Electronically Signed   By: Lovey Newcomer M.D.   On: 09/02/2014 09:50   US Thoracentesis Asp Pleural Space W/img Guide  09/02/2014   CLINICAL DATA:  Pleural Effusion  EXAM: ULTRASOUND GUIDED Right THORACENTESIS  COMPARISON:  None.  PROCEDURE: An ultrasound guided thoracentesis was thoroughly discussed with the patient and questions answered. The benefits, risks, alternatives and complications were also discussed. The patient understands and wishes to proceed with the procedure. Written consent was obtained.  Ultrasound was performed to localize and mark an adequate pocket of fluid in the right chest. The area was then prepped and draped in the normal sterile  fashion. 1% Lidocaine was used for local anesthesia. Under ultrasound guidance a 19 gauge Safe T centesis catheter was introduced. Thoracentesis was performed. The catheter was removed and a dressing applied.  COMPLICATIONS: None.  FINDINGS: A total of approximately 400 mls of Clear yellow fluid was removed. A fluid sample wassent for laboratory analysis.  IMPRESSION: Successful ultrasound guided Right thoracentesis yielding 400 mls of pleural fluid.  Read By:  Gareth Eagle PA-C   Electronically Signed   By: Jacqulynn Cadet M.D.   On: 09/02/2014 15:32    ASSESSMENT AND PLAN    1. Acute on chronic combined systolic and diastolic heart failure:  - Dyspnea stable to CPAP. - Lasix increased to 80mg  BID yesterday. Net I/O negative 832mL over the last 24 hours. Appears close to normal volume status. Continue current dose.  - Renal function further improved to 2.32 from 2.43 yesterday. Plan to f/u with Dr. Justin Mend outpatient.  - Cont bb, hydral/nitrate. No acei/arb/arni/spiro. - Possible RHC to evaluate filling pressure if nor improvement  2. Acute on chronic kidney disease stage IV: per above, renal function improved. Continue Lasix 80 mg BID for now.  3. HTN: stable and well-controlled. Cont bb, hydral/nitrate/ccb.  4. DM II: currently well controlled.  5. OSA: On CAPA. Compliance is issue.  6. Morbid obesity: advised weight reduction and life style change.   7. Normocytic anemia:Stable.   8.  Acute respiratory failure, unspecified whether with hypoxia or hypercapnia - PT found unresponsive this AM with SpO2 62% and CPAP on floor. SpO2 improved now. Patient is awake and alert. Tele showed sinus bradycardia. Plan to transfer to stepdown. Denies CP or palpitation. On CPAP.   9. Pleural effusion -Right  thoracentesis performed yesterday. A total of ~461ml of clear yellow fluid removed. LD is high.   Signed, Bhagat,Bhavinkumar PA-C Pager [phone]  As above, patient seen and examined.  She is improved this morning. She has mild dyspnea but no chest pain. She continues to have diminished breath sounds at the bases. There was some improvement in dyspnea following thoracentesis. Plan to continue Lasix at present dose and follow renal function. She is somewhat bradycardic. Decrease carvedilol to 6.25 mg twice a day and increase hydralazine to 25 mg by mouth 3 times a day. Plan right heart catheterization later this week to help evaluate filling pressures and exclude pulmonary hypertension. If chest x-ray does not clear further with diuresis may need pulmonary evaluation. Kirk Ruths

## 2014-09-03 NOTE — Progress Notes (Signed)
Pt. States that she isn't ready to go on CPAP at this time & needs something for anxiety before being placed on CPAP. RN was made aware & stated that she would place pt. On CPAP when the pt. Was ready. CPAP is set up at the pt.'s bedside.

## 2014-09-03 NOTE — Progress Notes (Signed)
[facility] Progress Note   Doris Howell [id number] DOB: [birth year] DOA: 08/28/2014 PCP: Irven Shelling, MD  Brief narrative: Doris Howell is a 79 y.o. female with past medical history of diastolic heart failure, hypertension, diabetes mellitus who presented to the hospital for shortness of breath and was admitted for a CHF versus COPD exacerbation. In terms improved with treatment but she subsequently developed acute respiratory failure suspected to be due to not wearing CPap overnight and removing her oxygen as well in the middle of the night. This resolved after placing her on BiPAP.   She underwent a right heart catheterization in January and was found to have a pulmonary artery pressure of 64/25 with 40. She was thought to have biventricular heart failure with preserved left and ejection fraction. She also underwent a lexican myoview in January which showed left ventricle systolic dysfunction with EF of 44% without any ischemia.   Subjective: Sleepy from being given Ativan this AM- did not wear the CPAP overnight as it was making her anxious.   Assessment/Plan: Acute hypoxic respiratory failure -Suspected to be secondary to COPD and congestive heart failure-currently requiring 3 L of oxygen (A) acute systolic and diastolic heart failure/ pleural effusions- EF of 45-50% with grade 2 diastolic dysfunction on echo on 4/16  - chest x-ray from 4/24 revealed bilateral effusions right greater than left  -5/24-She underwent IR guided thoracentesis obtaining 400 mL of clear yellow fluid  -  5/25-repeat chest x-ray continues to show infiltrates consistent with consolidation-infection versus fluid- treatment for pneumonia not initiated as patient has no cough fever or leukocytosis  - Continue lasix, Imdur, hydralazine, Coreg - cardiology assisting with management  (B) COPD- appears to be stable-given a trial of steroid-induced without improvement in dyspnea- continue  duo nebs twice a day  (C) obstructive sleep apnea -Does not use C Pap at home-apparently could not afford it-continue C Pap in hospital while sleeping   CKD stage IV -Renal ultrasound reveals chronic kidney disease -Renal function has remained stable thus far during the hospital stay-continue to follow carefully while administering Lasix -PTH noted to be elevated at 74 -Up in a dose of Aranesp on 5/23 - Outpatient follow-up with [facility]  Diabetes mellitus type 2 -A1c is 6.6 -2 new current treatment  Hypertension -BP stable on current medications- Coreg, Imdur, hydralazine and Lasix  Gout -Continue allopurinol  Anemia of chronic disease -IMA globin is at baseline -Given Aranesp on 5/23   Code Status: Full code Family Communication:  Disposition Plan: Continue to follow for improvement in respiratory failure DVT prophylaxis: Heparin Consultants: Nephrology, cardiology Procedures: IR guided thoracentesis 5/24  Antibiotics: Anti-infectives    None      Objective: Filed Weights   09/01/14 0655 09/02/14 0600 09/03/14 0400  Weight: 87.816 kg (193 lb 9.6 oz) 86.1 kg (189 lb 13.1 oz) 86.5 kg (190 lb 11.2 oz)    Intake/Output Summary (Last 24 hours) at 09/03/14 0754 Last data filed at 09/03/14 0400  Gross per 24 hour  Intake    960 ml  Output   1850 ml  Net   -890 ml     Vitals Filed Vitals:   09/02/14 2342 09/02/14 2356 09/03/14 0347 09/03/14 0400  BP: 128/48  137/69   Pulse:      Temp:  98.4 F (36.9 C)  98.7 F (37.1 C)  TempSrc:  Oral  Oral  Resp:      Height:      Weight:  86.5 kg (190 lb 11.2 oz)  SpO2:    94%    Exam:  General:  Sleepy but able to be awakened- not in acute distress  HEENT: No icterus, No thrush  Cardiovascular: regular rate and rhythm, S1/S2 No murmur  Respiratory: crackles in RLL, no wheeze or rhonchi  Abdomen: Soft, +Bowel sounds, non tender, non distended, no guarding  MSK: No LE edema, cyanosis or  clubbing  Data Reviewed: Basic Metabolic Panel:  Recent Labs Lab 08/30/14 0320 08/31/14 0559 09/01/14 0454 09/02/14 0318 09/03/14 0604  NA 142 140 140 141 141  K 3.8 3.8 3.8 4.0 3.6  CL 103 103 103 102 101  CO2 26 27 28 25 28   GLUCOSE 196* 119* 66 110* 98  BUN 46* 59* 64* 69* 66*  CREATININE 2.55* 2.78* 2.77* 2.46* 2.32*  CALCIUM 9.3 9.0 9.0 9.2 8.9   Liver Function Tests:  Recent Labs Lab 08/28/14 1204  AST 31  ALT 23  ALKPHOS 101  BILITOT 0.6  PROT 7.0  ALBUMIN 3.4*   No results for input(s): LIPASE, AMYLASE in the last 168 hours. No results for input(s): AMMONIA in the last 168 hours. CBC:  Recent Labs Lab 08/28/14 1204  08/30/14 0320 08/31/14 0559 09/01/14 0454 09/02/14 0318 09/03/14 0604  WBC 6.4  < > 5.6 8.1 9.9 8.0 7.8  NEUTROABS 4.7  --   --   --   --   --   --   HGB 8.9*  < > 8.8* 8.3* 8.7* 8.8* 8.2*  HCT 28.0*  < > 26.4* 26.0* 26.5* 28.3* 26.2*  MCV 88.3  < > 89.2 88.7 88.6 89.6 89.1  PLT 279  < > 241 241 250 224 232  < > = values in this interval not displayed. Cardiac Enzymes:  Recent Labs Lab 08/28/14 2000 08/29/14 0001 08/29/14 0531  TROPONINI 0.03 0.03 0.03   BNP (last 3 results)  Recent Labs  04/15/14 0408 07/28/14 1536 08/28/14 1204  BNP 591.0* 801.5* 646.5*    ProBNP (last 3 results)  Recent Labs  01/09/14 1515 02/05/14 1216  PROBNP 2595.0* 2767.0*    CBG:  Recent Labs Lab 09/02/14 1057 09/02/14 1135 09/02/14 1628 09/02/14 2117 09/03/14 0750  GLUCAP 113* 125* 138* 136* 103*    Recent Results (from the past 240 hour(s))  Culture, Urine     Status: None   Collection Time: 08/29/14  6:48 PM  Result Value Ref Range Status   Specimen Description URINE, CATHETERIZED  Final   Special Requests NONE  Final   Colony Count   Final    >=100,000 COLONIES/ML Performed at [facility]   Final    ESCHERICHIA COLI Performed at [facility]    Report Status 08/31/2014 FINAL  Final    Organism ID, Bacteria ESCHERICHIA COLI  Final      Susceptibility   Escherichia coli - MIC*    AMPICILLIN >=32 RESISTANT Resistant     CEFAZOLIN <=4 SENSITIVE Sensitive     CEFTRIAXONE <=1 SENSITIVE Sensitive     CIPROFLOXACIN >=4 RESISTANT Resistant     GENTAMICIN <=1 SENSITIVE Sensitive     LEVOFLOXACIN >=8 RESISTANT Resistant     NITROFURANTOIN <=16 SENSITIVE Sensitive     TOBRAMYCIN <=1 SENSITIVE Sensitive     TRIMETH/SULFA <=20 SENSITIVE Sensitive     PIP/TAZO <=4 SENSITIVE Sensitive     * ESCHERICHIA COLI  MRSA PCR Screening     Status: None   Collection Time:  09/02/14 11:20 AM  Result Value Ref Range Status   MRSA by PCR NEGATIVE NEGATIVE Final    Comment:        The GeneXpert MRSA Assay (FDA approved for NASAL specimens only), is one component of a comprehensive MRSA colonization surveillance program. It is not intended to diagnose MRSA infection nor to guide or monitor treatment for MRSA infections.      Studies: Dg Chest 1 View  09/02/2014   CLINICAL DATA:  Right pleural effusion status post thoracentesis.  EXAM: CHEST  1 VIEW  COMPARISON:  Chest radiograph 09/02/2014  FINDINGS: Monitoring leads overlie the patient. Stable cardiomegaly. Grossly unchanged right-greater-than-left mid and upper lung pulmonary consolidative opacities. Persistent small layering left pleural effusion with underlying pulmonary consolidation suggestive of associated atelectasis. Bilateral interstitial pulmonary opacities. Interval decrease in size of right pleural effusion. No definite pneumothorax.  IMPRESSION: Persistent right-greater-than-left mid and upper lung airspace opacities potentially secondary to multi focal pneumonia.  Slight interval improvement interstitial pulmonary edema. Cardiomegaly.  Persistent small left layering pleural effusion.  Interval decrease in size right pleural effusion.   Electronically Signed   By: Lovey Newcomer M.D.   On: 09/02/2014 15:44   Dg Chest 2  View  09/01/2014   CLINICAL DATA:  Shortness of breath, congestive heart failure  EXAM: CHEST  2 VIEW  COMPARISON:  08/18/2014  FINDINGS: Slight interval improvement in aeration with moderate to marked cardiomegaly and central vascular congestion reidentified. Increased moderate size pleural effusions are noted. Patchy perihilar airspace opacities are noted.  IMPRESSION: Improved aeration however increased moderate pleural effusions and patchy perihilar airspace opacities are identified likely indicating worsening edema. Other alveolar filling processes could appear similar.   Electronically Signed   By: Conchita Paris M.D.   On: 09/01/2014 13:02   Dg Chest Port 1 View  09/02/2014   CLINICAL DATA:  Patient with shortness of breath.  EXAM: PORTABLE CHEST - 1 VIEW  COMPARISON:  Chest radiograph 09/01/2014  FINDINGS: Monitoring leads overlie the patient. Stable cardiomegaly. Interval development of large right greater than left mid and upper lung consolidative pulmonary opacities. Small to moderate layering bilateral pleural effusions with underlying opacities suggestive of atelectasis. Bilateral predominately perihilar interstitial pulmonary opacities.  IMPRESSION: Interval development right-greater-than-left mid and upper lung pulmonary consolidation which may represent multi focal pneumonia, hemorrhage, infarct or more focal areas of consolidation due to edema.  Cardiomegaly with interstitial edema.  Layering bilateral pleural effusions with underlying atelectasis.   Electronically Signed   By: Lovey Newcomer M.D.   On: 09/02/2014 09:05   Dg Abd 2 Views  09/02/2014   CLINICAL DATA:  Patient with abdominal distension.  EXAM: ABDOMEN - 2 VIEW  COMPARISON:  CT 04/05/2014  FINDINGS: Gas is demonstrated within nondilated loops of large and small bowel. No evidence for bowel obstruction. Decubitus images demonstrate no free intraperitoneal air. Surgical clips left mid abdomen. Cardiomegaly. Bibasilar heterogeneous  opacities. Calcifications in left renal fossa.  IMPRESSION: Nonobstructed bowel gas pattern.  Cardiomegaly.  Basilar heterogeneous opacities compatible with known pleural effusions possible atelectasis.   Electronically Signed   By: Lovey Newcomer M.D.   On: 09/02/2014 09:50   US Thoracentesis Asp Pleural Space W/img Guide  09/02/2014   CLINICAL DATA:  Pleural Effusion  EXAM: ULTRASOUND GUIDED Right THORACENTESIS  COMPARISON:  None.  PROCEDURE: An ultrasound guided thoracentesis was thoroughly discussed with the patient and questions answered. The benefits, risks, alternatives and complications were also discussed. The patient understands and wishes to proceed  with the procedure. Written consent was obtained.  Ultrasound was performed to localize and mark an adequate pocket of fluid in the right chest. The area was then prepped and draped in the normal sterile fashion. 1% Lidocaine was used for local anesthesia. Under ultrasound guidance a 19 gauge Safe T centesis catheter was introduced. Thoracentesis was performed. The catheter was removed and a dressing applied.  COMPLICATIONS: None.  FINDINGS: A total of approximately 400 mls of Clear yellow fluid was removed. A fluid sample wassent for laboratory analysis.  IMPRESSION: Successful ultrasound guided Right thoracentesis yielding 400 mls of pleural fluid.  Read By:  Gareth Eagle PA-C   Electronically Signed   By: Jacqulynn Cadet M.D.   On: 09/02/2014 15:32    Scheduled Meds:  Scheduled Meds: . allopurinol  100 mg Oral BID  . amLODipine  10 mg Oral q morning - 10a  . aspirin EC  81 mg Oral Daily  . carvedilol  12.5 mg Oral BID WC  . furosemide  80 mg Intravenous Q12H  . heparin  5,000 Units Subcutaneous 3 times per day  . hydrALAZINE  10 mg Oral BID  . insulin aspart  0-9 Units Subcutaneous TID WC  . ipratropium-albuterol  3 mL Nebulization BID  . isosorbide mononitrate  30 mg Oral Daily  . latanoprost  1 drop Both Eyes QHS  . polyethylene glycol   17 g Oral Daily  . sodium chloride  3 mL Intravenous Q12H   Continuous Infusions:   Time spent on care of this patient: 35 minutes   [place], MD 09/03/2014, 7:54 AM  LOS: 6 days   [facility] Office  [phone] Pager - Text Page per [email or web address]  If 7PM-7AM, please contact night-coverage [email or web address]

## 2014-09-03 NOTE — Progress Notes (Signed)
Occupational Therapy Treatment Patient Details Name: Doris Howell MRN: [id number] DOB: 1935-12-17 Today's Date: 09/03/2014    History of present illness 79 y.o. female with a past medical history of diastolic congestive heart failure, hypertension, type 2 diabetes, who was hospitalized in April for congestive heart failure. She underwent a right heart catheterization in January. Currently presenting with increased SOB and dyspnea on exertion likely acute on chronic CHF.   OT comments  Pt very fatigued and moving slowly today.  She requires min - mod A for ADLs and functional mobility.  Sats decreased to 84% on 4L supplemental 02, but returned to 90% with rest and pursed lip breathing.   Pt reports that spouse will be her primary caregiver and she doesn't feel like he will be able to provide adequate assist.  However, family indicated to PT that daughter would be assisting - family was not available during session to confirm info. If extended family not available, she may require SNF.   Follow Up Recommendations  Home health OT;Other (comment) (may need SNF if family unable to provide assist )    Equipment Recommendations  3 in 1 bedside comode    Recommendations for Other Services      Precautions / Restrictions Precautions Precautions: Fall Precaution Comments: watch sats Restrictions Weight Bearing Restrictions: No       Mobility Bed Mobility Overal bed mobility: Needs Assistance Bed Mobility: Sit to Supine       Sit to supine: Min guard   General bed mobility comments: Pt movs very slowly.  Requires verbal cues for safety   Transfers Overall transfer level: Needs assistance Equipment used: Rolling walker (2 wheeled) Transfers: Sit to/from [facility] Sit to Stand: Min assist Stand pivot transfers: Min assist       General transfer comment: assist to power up into standing and for balance.     Balance Overall balance assessment: Needs  assistance Sitting-balance support: Feet supported Sitting balance-Leahy Scale: Fair     Standing balance support: Bilateral upper extremity supported Standing balance-Leahy Scale: Fair                     ADL Overall ADL's : Needs assistance/impaired             Lower Body Bathing: Moderate assistance;Sit to/from stand       Lower Body Dressing: Moderate assistance;Sit to/from stand   Toilet Transfer: Minimal assistance;Comfort height toilet;Grab bars;RW           Functional mobility during ADLs: Minimal assistance;Rolling walker General ADL Comments: Pt very fatigued today.  Requires increased time to complet basic tasks.  She reports spouse has visual impairment and she doesn't think he will be able to provide adequate assist at discharge.  Spoke with PT as family was present during her session, and daughter indicated she would be assisting at discharge - no family present to confirm info during OT session       Vision                     Perception     Praxis      Cognition   Behavior During Therapy: [facility] for tasks assessed/performed Overall Cognitive Status: Within Functional Limits for tasks assessed                       Extremity/Trunk Assessment               Exercises  General Exercises - Lower Extremity Quad Sets: 10 reps Long Arc Quad: 10 reps Hip ABduction/ADduction: 10 reps   Shoulder Instructions       General Comments      Pertinent Vitals/ Pain       Pain Assessment: No/denies pain  Home Living                                          Prior Functioning/Environment              Frequency Min 2X/week     Progress Toward Goals  OT Goals(current goals can now be found in the care plan section)  Progress towards OT goals: Not progressing toward goals - comment (fatigue )  Acute Rehab OT Goals Patient Stated Goal: go home OT Goal Formulation: With patient/family Time For Goal  Achievement: 09/05/14 Potential to Achieve Goals: Good ADL Goals Pt Will Perform Grooming: with modified independence;standing Pt Will Perform Lower Body Bathing: with modified independence;sit to/from stand Pt Will Perform Lower Body Dressing: with modified independence;sit to/from stand Pt Will Transfer to Toilet: with modified independence;ambulating;bedside commode Pt Will Perform Toileting - Clothing Manipulation and hygiene: with modified independence;sit to/from stand Pt Will Perform Tub/Shower Transfer: with supervision;rolling walker;ambulating;3 in 1;Tub transfer Additional ADL Goal #1: Patient will be educated on energy conservation techniques to help increase independence and safety with ADLs and IADLs.   Plan Discharge plan remains appropriate    Co-evaluation                 End of Session Equipment Utilized During Treatment: Rolling walker;Oxygen   Activity Tolerance Patient limited by fatigue   Patient Left in bed;with call bell/phone within reach;with bed alarm set   Nurse Communication Mobility status        Time: [id number] OT Time Calculation (min): 19 min  Charges: OT General Charges $OT Visit: 1 Procedure OT Treatments $Therapeutic Activity: 8-22 mins  Ranferi Clingan M 09/03/2014, 1:41 PM

## 2014-09-03 NOTE — Care Management Note (Signed)
Case Management Note  Patient Details  Name: Doris Howell MRN: [id number] Date of Birth: [birth year]  Subjective/Objective:  Pt transfer from [place]. Pt continues on IV lasix.                   Action/Plan: Plan for home with [facility] services once stable. CM will continue to monitor.   Expected Discharge Date:  09/01/14               Expected Discharge Plan:  [place]  In-House Referral:     Discharge planning Services  CM Consult  Post Acute Care Choice:    Choice offered to:     DME Arranged:    DME Agency:     HH Arranged:    [place]:     Status of Service:     Medicare Important Message Given:  Yes Date Medicare IM Given:  09/01/14 Medicare IM give by:  Mindi Slicker RN Date Additional Medicare IM Given:    Additional Medicare Important Message give by:     If discussed at [place] of Stay Meetings, dates discussed:  09-04-14  Additional Comments:  [name], RN 09/03/2014, 2:22 PM

## 2014-09-03 NOTE — Progress Notes (Signed)
Physical Therapy Treatment Patient Details Name: Doris Howell MRN: [id number] DOB: 21-Nov-1935 Today's Date: 09/03/2014    History of Present Illness 79 y.o. female with a past medical history of diastolic congestive heart failure, hypertension, type 2 diabetes, who was hospitalized in April for congestive heart failure. She underwent a right heart catheterization in January. Currently presenting with increased SOB and dyspnea on exertion likely acute on chronic CHF.    PT Comments    Pt is making slow progress towards physical therapy goals. Was not able to tolerate as much activity this session. Had many visitors present and had gotten a bath earlier in the morning, and pt may have been fatigued. Pt required 4L/min supplemental O2 to maintain sats >87% during ambulation. Will continue to follow.   Follow Up Recommendations  Home health PT;Supervision - Intermittent     Equipment Recommendations  Other (comment) (May benefit from rollator for energy conservation)    Recommendations for Other Services       Precautions / Restrictions Precautions Precautions: Fall Precaution Comments: watch sats Restrictions Weight Bearing Restrictions: No    Mobility  Bed Mobility               General bed mobility comments: Pt sitting up in recliner upon PT arrival.  Transfers Overall transfer level: Needs assistance Equipment used: Rolling walker (2 wheeled) Transfers: Sit to/from Stand Sit to Stand: Mod assist         General transfer comment: Pt required mod assist to power-up to full stand from the low recliner chair. Encouraged family not to assist and for pt to do as much as she could on her own.   Ambulation/Gait Ambulation/Gait assistance: Min assist Ambulation Distance (Feet): 125 Feet Assistive device: Rolling walker (2 wheeled) Gait Pattern/deviations: Step-through pattern;Decreased stride length;Trunk flexed Gait velocity: Decreased Gait velocity  interpretation: Below normal speed for age/gender General Gait Details: VC's for continuous pursed-lip breathing. Started out on 3L/min supplemental O2 and sats ranged from 88-90%. On the way back to the room sats began to drop <87% and O2 was increased to 4L/min supplemental O2. Pt was then able to maintain sats ranging from 87-91%. Occasional assist for walker placement and direction.   Stairs            Wheelchair Mobility    Modified Rankin (Stroke Patients Only)       Balance Overall balance assessment: Needs assistance Sitting-balance support: Feet supported;No upper extremity supported Sitting balance-Leahy Scale: Fair     Standing balance support: Bilateral upper extremity supported Standing balance-Leahy Scale: Fair                      Cognition Arousal/Alertness: Awake/alert Behavior During Therapy: WFL for tasks assessed/performed Overall Cognitive Status: Within Functional Limits for tasks assessed                      Exercises General Exercises - Lower Extremity Quad Sets: 10 reps Long Arc Quad: 10 reps Hip ABduction/ADduction: 10 reps    General Comments        Pertinent Vitals/Pain Pain Assessment: No/denies pain    Home Living                      Prior Function            PT Goals (current goals can now be found in the care plan section) Acute Rehab PT Goals Patient Stated Goal: go home PT  Goal Formulation: With patient Time For Goal Achievement: 09/12/14 Potential to Achieve Goals: Good Progress towards PT goals: Progressing toward goals    Frequency  Min 3X/week    PT Plan Current plan remains appropriate    Co-evaluation             End of Session Equipment Utilized During Treatment: Gait belt;Oxygen Activity Tolerance: Patient limited by fatigue Patient left: in chair;with call bell/phone within reach;with family/visitor present     Time: 1121-1200 PT Time Calculation (min) (ACUTE ONLY):  39 min  Charges:  $Gait Training: 23-37 mins $Therapeutic Activity: 8-22 mins                    G Codes:      Rolinda Roan 09-26-2014, 12:59 PM   Rolinda Roan, PT, DPT Acute Rehabilitation Services Pager: [phone]

## 2014-09-04 ENCOUNTER — Inpatient Hospital Stay (HOSPITAL_COMMUNITY): Payer: Medicare Other

## 2014-09-04 LAB — BASIC METABOLIC PANEL
Anion gap: 12 (ref 5–15)
BUN: 66 mg/dL — ABNORMAL HIGH (ref 6–20)
CO2: 29 mmol/L (ref 22–32)
Calcium: 9.2 mg/dL (ref 8.9–10.3)
Chloride: 102 mmol/L (ref 101–111)
Creatinine, Ser: 2.25 mg/dL — ABNORMAL HIGH (ref 0.44–1.00)
GFR calc Af Amer: 23 mL/min — ABNORMAL LOW (ref >60)
GFR calc non Af Amer: 20 mL/min — ABNORMAL LOW (ref >60)
Glucose, Bld: 83 mg/dL (ref 65–99)
Potassium: 3.9 mmol/L (ref 3.5–5.1)
Sodium: 143 mmol/L (ref 135–145)

## 2014-09-04 LAB — GLUCOSE, CAPILLARY
Glucose-Capillary: 102 mg/dL — ABNORMAL HIGH (ref 65–99)
Glucose-Capillary: 149 mg/dL — ABNORMAL HIGH (ref 65–99)
Glucose-Capillary: 210 mg/dL — ABNORMAL HIGH (ref 65–99)
Glucose-Capillary: 140 mg/dL — ABNORMAL HIGH (ref 65–99)

## 2014-09-04 MED ORDER — FUROSEMIDE 10 MG/ML IJ SOLN
120.0000 mg | Freq: Two times a day (BID) | INTRAVENOUS | Status: DC
  Administered 2014-09-04 – 2014-09-06 (×4): 120 mg via INTRAVENOUS
  Filled 2014-09-04 (×5): qty 12

## 2014-09-04 NOTE — Progress Notes (Signed)
After several hours, pt finally put on her CPAP mask. Pt is tolerating well at this time.

## 2014-09-04 NOTE — Progress Notes (Signed)
[facility] Progress Note   Doris Howell [id number] DOB: 10-15-1935 DOA: 08/28/2014 PCP: Irven Shelling, MD  Brief narrative: Doris Howell is a 79 y.o. female with past medical history of diastolic heart failure, hypertension, diabetes mellitus who presented to the hospital for shortness of breath and was admitted for a CHF versus COPD exacerbation. She has had multiple admissions for acute diastolic CHF. There is no clear history of COPD in her records. She has had both a pulmonary and cardiac eval this year. Pulmonary did not feel that  her hypoxia had an underlying pulmonary etiology   She underwent a right heart catheterization in January and was found to have a pulmonary artery pressure of 64/25 with a mean of 40. She was thought to have biventricular heart failure with preserved LV ejection fraction. She also underwent a lexican myoview in January which showed left ventricle systolic dysfunction with EF of 44% without any ischemia.   Subjective: Sleepy this AM. No complaints.   Assessment/Plan: Acute hypoxic respiratory failure -currently requiring 3 L of oxygen (A) acute systolic and diastolic heart failure/ pleural effusions- EF of 45-50% with grade 2 diastolic dysfunction on echo on 4/16  - chest x-ray from 4/24 revealed bilateral effusions right greater than left  -5/24-She underwent IR guided thoracentesis obtaining 400 mL of clear yellow fluid  -  5/25-repeat chest x-ray continues to show infiltrates consistent with consolidation-infection versus fluid- treatment for pneumonia not initiated as patient has no cough fever or leukocytosis  - Continue lasix, Imdur, hydralazine, Coreg - cardiology assisting with management  (B) COPD? - doubtful- given a trial of steroid-induced without improvement in dyspnea- continue duo nebs twice a day  (C) obstructive sleep apnea/ OHS -Does not use C Pap at home-apparently could not afford it-continue C Pap in  hospital while sleeping   CKD stage IV -Renal ultrasound reveals chronic kidney disease -Renal function has remained stable thus far during the hospital stay-continue to follow carefully while administering Lasix -PTH noted to be elevated at 74 -Up in a dose of Aranesp on 5/23 - Outpatient follow-up with [facility]  Diabetes mellitus type 2 -A1c is 6.6 -2 new current treatment  Hypertension -BP stable on current medications- Coreg, Imdur, hydralazine and Lasix  Gout -Continue allopurinol  Anemia of chronic disease -hemoglobin is at baseline -Given Aranesp on 5/23   Code Status: Full code Family Communication:  Disposition Plan: Continue to follow for improvement in respiratory failure DVT prophylaxis: Heparin Consultants: Nephrology, cardiology Procedures: IR guided thoracentesis 5/24  Antibiotics: Anti-infectives    None      Objective: Filed Weights   09/03/14 0400 09/04/14 0443 09/04/14 0600  Weight: 86.5 kg (190 lb 11.2 oz) 91.627 kg (202 lb) 84.9 kg (187 lb 2.7 oz)    Intake/Output Summary (Last 24 hours) at 09/04/14 0803 Last data filed at 09/04/14 0400  Gross per 24 hour  Intake    290 ml  Output   1050 ml  Net   -760 ml     Vitals Filed Vitals:   09/04/14 0009 09/04/14 0443 09/04/14 0600 09/04/14 0755  BP: 138/62 127/63  120/50  Pulse: 78 69  72  Temp: 97.8 F (36.6 C) 98.3 F (36.8 C)  98.1 F (36.7 C)  TempSrc: Oral Oral  Oral  Resp:    18  Height:  5\' 1"  (1.549 m)    Weight:  91.627 kg (202 lb) 84.9 kg (187 lb 2.7 oz)   SpO2: 92% 99%  100%  Exam:  General:  Sleepy but able to be awakened- not in acute distress  HEENT: No icterus, No thrush  Cardiovascular: regular rate and rhythm, S1/S2 No murmur  Respiratory: crackles in RLL, no wheeze or rhonchi  Abdomen: Soft, +Bowel sounds, non tender, non distended, no guarding  MSK: No LE edema, cyanosis or clubbing  Data Reviewed: Basic Metabolic Panel:  Recent Labs Lab  08/31/14 0559 09/01/14 0454 09/02/14 0318 09/03/14 0604 09/04/14 0339  NA 140 140 141 141 143  K 3.8 3.8 4.0 3.6 3.9  CL 103 103 102 101 102  CO2 27 28 25 28 29   GLUCOSE 119* 66 110* 98 83  BUN 59* 64* 69* 66* 66*  CREATININE 2.78* 2.77* 2.46* 2.32* 2.25*  CALCIUM 9.0 9.0 9.2 8.9 9.2   Liver Function Tests:  Recent Labs Lab 08/28/14 1204  AST 31  ALT 23  ALKPHOS 101  BILITOT 0.6  PROT 7.0  ALBUMIN 3.4*   No results for input(s): LIPASE, AMYLASE in the last 168 hours. No results for input(s): AMMONIA in the last 168 hours. CBC:  Recent Labs Lab 08/28/14 1204  08/30/14 0320 08/31/14 0559 09/01/14 0454 09/02/14 0318 09/03/14 0604  WBC 6.4  < > 5.6 8.1 9.9 8.0 7.8  NEUTROABS 4.7  --   --   --   --   --   --   HGB 8.9*  < > 8.8* 8.3* 8.7* 8.8* 8.2*  HCT 28.0*  < > 26.4* 26.0* 26.5* 28.3* 26.2*  MCV 88.3  < > 89.2 88.7 88.6 89.6 89.1  PLT 279  < > 241 241 250 224 232  < > = values in this interval not displayed. Cardiac Enzymes:  Recent Labs Lab 08/28/14 2000 08/29/14 0001 08/29/14 0531  TROPONINI 0.03 0.03 0.03   BNP (last 3 results)  Recent Labs  04/15/14 0408 07/28/14 1536 08/28/14 1204  BNP 591.0* 801.5* 646.5*    ProBNP (last 3 results)  Recent Labs  01/09/14 1515 02/05/14 1216  PROBNP 2595.0* 2767.0*    CBG:  Recent Labs Lab 09/02/14 2117 09/03/14 0750 09/03/14 1140 09/03/14 1652 09/03/14 2014  GLUCAP 136* 103* 160* 116* 210*    Recent Results (from the past 240 hour(s))  Culture, Urine     Status: None   Collection Time: 08/29/14  6:48 PM  Result Value Ref Range Status   Specimen Description URINE, CATHETERIZED  Final   Special Requests NONE  Final   Colony Count   Final    >=100,000 COLONIES/ML Performed at [place]   Final    ESCHERICHIA COLI Performed at [facility]    Report Status 08/31/2014 FINAL  Final   Organism ID, Bacteria ESCHERICHIA COLI  Final      Susceptibility    Escherichia coli - MIC*    AMPICILLIN >=32 RESISTANT Resistant     CEFAZOLIN <=4 SENSITIVE Sensitive     CEFTRIAXONE <=1 SENSITIVE Sensitive     CIPROFLOXACIN >=4 RESISTANT Resistant     GENTAMICIN <=1 SENSITIVE Sensitive     LEVOFLOXACIN >=8 RESISTANT Resistant     NITROFURANTOIN <=16 SENSITIVE Sensitive     TOBRAMYCIN <=1 SENSITIVE Sensitive     TRIMETH/SULFA <=20 SENSITIVE Sensitive     PIP/TAZO <=4 SENSITIVE Sensitive     * ESCHERICHIA COLI  MRSA PCR Screening     Status: None   Collection Time: 09/02/14 11:20 AM  Result Value Ref Range Status   MRSA by PCR NEGATIVE  NEGATIVE Final    Comment:        The GeneXpert MRSA Assay (FDA approved for NASAL specimens only), is one component of a comprehensive MRSA colonization surveillance program. It is not intended to diagnose MRSA infection nor to guide or monitor treatment for MRSA infections.   Body fluid culture     Status: None (Preliminary result)   Collection Time: 09/02/14  3:30 PM  Result Value Ref Range Status   Specimen Description FLUID  Final   Special Requests PLEURAL RIGHT  Final   Gram Stain   Final    NO WBC SEEN NO ORGANISMS SEEN Performed at [facility]    Culture   Final    NO GROWTH 1 DAY Performed at [facility]    Report Status PENDING  Incomplete     Studies: Dg Chest 1 View  09/02/2014   CLINICAL DATA:  Right pleural effusion status post thoracentesis.  EXAM: CHEST  1 VIEW  COMPARISON:  Chest radiograph 09/02/2014  FINDINGS: Monitoring leads overlie the patient. Stable cardiomegaly. Grossly unchanged right-greater-than-left mid and upper lung pulmonary consolidative opacities. Persistent small layering left pleural effusion with underlying pulmonary consolidation suggestive of associated atelectasis. Bilateral interstitial pulmonary opacities. Interval decrease in size of right pleural effusion. No definite pneumothorax.  IMPRESSION: Persistent right-greater-than-left mid and upper  lung airspace opacities potentially secondary to multi focal pneumonia.  Slight interval improvement interstitial pulmonary edema. Cardiomegaly.  Persistent small left layering pleural effusion.  Interval decrease in size right pleural effusion.   Electronically Signed   By: Lovey Newcomer M.D.   On: 09/02/2014 15:44   Dg Chest Port 1 View  09/02/2014   CLINICAL DATA:  Patient with shortness of breath.  EXAM: PORTABLE CHEST - 1 VIEW  COMPARISON:  Chest radiograph 09/01/2014  FINDINGS: Monitoring leads overlie the patient. Stable cardiomegaly. Interval development of large right greater than left mid and upper lung consolidative pulmonary opacities. Small to moderate layering bilateral pleural effusions with underlying opacities suggestive of atelectasis. Bilateral predominately perihilar interstitial pulmonary opacities.  IMPRESSION: Interval development right-greater-than-left mid and upper lung pulmonary consolidation which may represent multi focal pneumonia, hemorrhage, infarct or more focal areas of consolidation due to edema.  Cardiomegaly with interstitial edema.  Layering bilateral pleural effusions with underlying atelectasis.   Electronically Signed   By: Lovey Newcomer M.D.   On: 09/02/2014 09:05   Dg Abd 2 Views  09/02/2014   CLINICAL DATA:  Patient with abdominal distension.  EXAM: ABDOMEN - 2 VIEW  COMPARISON:  CT 04/05/2014  FINDINGS: Gas is demonstrated within nondilated loops of large and small bowel. No evidence for bowel obstruction. Decubitus images demonstrate no free intraperitoneal air. Surgical clips left mid abdomen. Cardiomegaly. Bibasilar heterogeneous opacities. Calcifications in left renal fossa.  IMPRESSION: Nonobstructed bowel gas pattern.  Cardiomegaly.  Basilar heterogeneous opacities compatible with known pleural effusions possible atelectasis.   Electronically Signed   By: Lovey Newcomer M.D.   On: 09/02/2014 09:50   US Thoracentesis Asp Pleural Space W/img Guide  09/02/2014    CLINICAL DATA:  Pleural Effusion  EXAM: ULTRASOUND GUIDED Right THORACENTESIS  COMPARISON:  None.  PROCEDURE: An ultrasound guided thoracentesis was thoroughly discussed with the patient and questions answered. The benefits, risks, alternatives and complications were also discussed. The patient understands and wishes to proceed with the procedure. Written consent was obtained.  Ultrasound was performed to localize and mark an adequate pocket of fluid in the right chest. The area was then  prepped and draped in the normal sterile fashion. 1% Lidocaine was used for local anesthesia. Under ultrasound guidance a 19 gauge Safe T centesis catheter was introduced. Thoracentesis was performed. The catheter was removed and a dressing applied.  COMPLICATIONS: None.  FINDINGS: A total of approximately 400 mls of Clear yellow fluid was removed. A fluid sample wassent for laboratory analysis.  IMPRESSION: Successful ultrasound guided Right thoracentesis yielding 400 mls of pleural fluid.  Read By:  Gareth Eagle PA-C   Electronically Signed   By: Jacqulynn Cadet M.D.   On: 09/02/2014 15:32    Scheduled Meds:  Scheduled Meds: . allopurinol  100 mg Oral BID  . amLODipine  10 mg Oral q morning - 10a  . aspirin EC  81 mg Oral Daily  . carvedilol  6.25 mg Oral BID WC  . furosemide  80 mg Intravenous Q12H  . heparin  5,000 Units Subcutaneous 3 times per day  . hydrALAZINE  25 mg Oral 3 times per day  . insulin aspart  0-9 Units Subcutaneous TID WC  . ipratropium-albuterol  3 mL Nebulization BID  . isosorbide mononitrate  30 mg Oral Daily  . latanoprost  1 drop Both Eyes QHS  . polyethylene glycol  17 g Oral Daily  . sodium chloride  3 mL Intravenous Q12H   Continuous Infusions:   Time spent on care of this patient: 35 minutes   Cody, MD 09/04/2014, 8:03 AM  LOS: 7 days   [facility] Office  [phone] Pager - Text Page per [email or web address]  If 7PM-7AM, please contact  night-coverage [email or web address]

## 2014-09-04 NOTE — Progress Notes (Signed)
   Patient Name: Doris Howell Date of Encounter: 09/04/2014  SUBJECTIVE Dyspnea improving  CURRENT MEDS . allopurinol  100 mg Oral BID  . amLODipine  10 mg Oral q morning - 10a  . aspirin EC  81 mg Oral Daily  . carvedilol  6.25 mg Oral BID WC  . furosemide  80 mg Intravenous Q12H  . heparin  5,000 Units Subcutaneous 3 times per day  . hydrALAZINE  25 mg Oral 3 times per day  . insulin aspart  0-9 Units Subcutaneous TID WC  . ipratropium-albuterol  3 mL Nebulization BID  . isosorbide mononitrate  30 mg Oral Daily  . latanoprost  1 drop Both Eyes QHS  . polyethylene glycol  17 g Oral Daily  . sodium chloride  3 mL Intravenous Q12H    OBJECTIVE  Filed Vitals:   09/04/14 0009 09/04/14 0443 09/04/14 0600 09/04/14 0755  BP: 138/62 127/63  120/50  Pulse: 78 69  72  Temp: 97.8 F (36.6 C) 98.3 F (36.8 C)  98.1 F (36.7 C)  TempSrc: Oral Oral  Oral  Resp:    18  Height:  5\' 1"  (1.549 m)    Weight:  202 lb (91.627 kg) 187 lb 2.7 oz (84.9 kg)   SpO2: 92% 99%  100%    Intake/Output Summary (Last 24 hours) at 09/04/14 0941 Last data filed at 09/04/14 0400  Gross per 24 hour  Intake    290 ml  Output   1050 ml  Net   -760 ml   Filed Weights   09/03/14 0400 09/04/14 0443 09/04/14 0600  Weight: 190 lb 11.2 oz (86.5 kg) 202 lb (91.627 kg) 187 lb 2.7 oz (84.9 kg)    PHYSICAL EXAM  General: Pleasant, obese woman NAD. Neuro: Alert and oriented X 3. Moves all extremities spontaneously. HEENT:  Normal  Neck: Supple Lungs:  Diminished BS bases Heart: RRR Abdomen: Soft, non-tender, mildly distended Extremities: No edema  Accessory Clinical Findings  CBC  Recent Labs  09/02/14 0318 09/03/14 0604  WBC 8.0 7.8  HGB 8.8* 8.2*  HCT 28.3* 26.2*  MCV 89.6 89.1  PLT 224 [id number]   Basic Metabolic Panel  Recent Labs  09/03/14 0604 09/04/14 0339  NA 141 143  K 3.6 3.9  CL 101 102  CO2 28 29  GLUCOSE 98 83  BUN 66* 66*  CREATININE 2.32* 2.25*  CALCIUM 8.9  9.2    TELE  Sinus rhythm.     ASSESSMENT AND PLAN    1. Acute on chronic combined systolic and diastolic heart failure:  - Echo 4/16 showed ejection fraction [id number], grade 2 diastolic dysfunction and elevated left ventricular filling pressures. - I and O -520. Change lasix to 120mg  BID. Follow renal function. - Cont bb, hydral/nitrate. No acei/arb/spiro. - RHC 1/16 showed PCWP 26 and PA pressure 64/25.  2. Acute on chronic kidney disease stage IV: per above, renal function stable. Continue Lasix 80 mg BID.  3. HTN: stable and well-controlled. Cont bb, hydral/nitrate/ccb.  4. DM II:   5. OSA: On CAPA.   6. Morbid obesity: advised weight reduction and life style change.   7. Normocytic anemia:Stable.   9. Pleural effusion -Right  thoracentesis performed  Signed, Kirk Ruths MD

## 2014-09-04 NOTE — Care Management Note (Addendum)
Case Management Note  Patient Details  Name: CARNESHA KROSS MRN: [id number] Date of Birth: 10-Jul-1935  Subjective/Objective:       Pt admitted for Acute on Chronic CHF. Pt is from home with husband.              Action/Plan: Pt will benefit from [facility] and PT services. CM did speak with pt and she is agreeable to [facility] services listed above. CM did make referral with [facility] and SOC to begin within 24-48 hrs of d/c. No further needs from CM at this time.    Expected Discharge Date:  09/01/14               Expected Discharge Plan:  [place]  In-House Referral:     Discharge planning Services  CM Consult  Post Acute Care Choice:    Choice offered to:     DME Arranged:    DME Agency:     HH Arranged:  PT, RN [place] Agency:  [place]  Status of Service:  Completed, signed off  Medicare Important Message Given:  Yes Date Medicare IM Given:  09/01/14 Medicare IM give by:  Mindi Slicker RN Date Additional Medicare IM Given: 09/04/14 Additional Medicare Important Message give by: Jacqlyn Krauss, RN,BSN   If discussed at Long Length of Stay Meetings, dates discussed:    Additional Comments:  [name], RN 09/04/2014, 11:39 AM

## 2014-09-05 LAB — BASIC METABOLIC PANEL
Anion gap: 11 (ref 5–15)
BUN: 65 mg/dL — ABNORMAL HIGH (ref 6–20)
CO2: 30 mmol/L (ref 22–32)
Calcium: 8.9 mg/dL (ref 8.9–10.3)
Chloride: 98 mmol/L — ABNORMAL LOW (ref 101–111)
Creatinine, Ser: 2.61 mg/dL — ABNORMAL HIGH (ref 0.44–1.00)
GFR calc Af Amer: 19 mL/min — ABNORMAL LOW (ref >60)
GFR calc non Af Amer: 17 mL/min — ABNORMAL LOW (ref >60)
Glucose, Bld: 85 mg/dL (ref 65–99)
Potassium: 3.5 mmol/L (ref 3.5–5.1)
Sodium: 139 mmol/L (ref 135–145)

## 2014-09-05 LAB — GLUCOSE, CAPILLARY
Glucose-Capillary: 126 mg/dL — ABNORMAL HIGH (ref 65–99)
Glucose-Capillary: 172 mg/dL — ABNORMAL HIGH (ref 65–99)
Glucose-Capillary: 207 mg/dL — ABNORMAL HIGH (ref 65–99)
Glucose-Capillary: 115 mg/dL — ABNORMAL HIGH (ref 65–99)

## 2014-09-05 NOTE — Progress Notes (Signed)
Physical Therapy Treatment Patient Details Name: Doris Howell MRN: [id number] DOB: [birth year] Today's Date: 09/05/2014    History of Present Illness 79 y.o. female with a past medical history of diastolic congestive heart failure, hypertension, type 2 diabetes, who was hospitalized in April for congestive heart failure. She underwent a right heart catheterization in January. Currently presenting with increased SOB and dyspnea on exertion likely acute on chronic CHF.    PT Comments    Progressing slowly with mobility. Encouraged pt to sit up in recliner/ambulate to and from bathroom with nursing supervision/assistance over weekend.   Follow Up Recommendations  Home health PT;Supervision - Intermittent     Equipment Recommendations  Other (comment) (may benefit from rollator for energy conservation)    Recommendations for Other Services       Precautions / Restrictions Precautions Precautions: Fall Precaution Comments: watch sats Restrictions Weight Bearing Restrictions: No    Mobility  Bed Mobility Overal bed mobility: Needs Assistance Bed Mobility: Supine to Sit   Sidelying to sit: Min guard Supine to sit: Min guard     General bed mobility comments: Pt movs very slowly.  Requires verbal cues for safety and hand placement on bed rails for support while transitioning to sitting  Transfers Overall transfer level: Needs assistance Equipment used: Rolling walker (2 wheeled) Transfers: Sit to/from Stand Sit to Stand: Min assist         General transfer comment: Assist to rise, stabilize, control descent.   Ambulation/Gait Ambulation/Gait assistance: Min guard Ambulation Distance (Feet): 100 Feet (15'x1, 100'x1) Assistive device: Rolling walker (2 wheeled) Gait Pattern/deviations: Decreased stride length     General Gait Details: Remained on 4L O2 for ambulation-mionitor not reading well during activity. Dyspnea 2/4. 1  standing rest break needed after ~50  feet. O2 sats 98% once seated back on bed.    Stairs            Wheelchair Mobility    Modified Rankin (Stroke Patients Only)       Balance                                    Cognition Arousal/Alertness: Awake/alert Behavior During Therapy: WFL for tasks assessed/performed Overall Cognitive Status: Within Functional Limits for tasks assessed                      Exercises      General Comments        Pertinent Vitals/Pain Pain Assessment: No/denies pain    Home Living                      Prior Function            PT Goals (current goals can now be found in the care plan section) Progress towards PT goals: Progressing toward goals (slowly)    Frequency  Min 3X/week    PT Plan Current plan remains appropriate    Co-evaluation             End of Session Equipment Utilized During Treatment: Oxygen Activity Tolerance: Patient limited by fatigue Patient left: in bed;with call bell/phone within reach;with family/visitor present (sitting EOB)     Time: [id number] PT Time Calculation (min) (ACUTE ONLY): 26 min  Charges:  $Gait Training: 8-22 mins $Therapeutic Activity: 8-22 mins  G Codes:      Doris Howell, MPT Pager: [phone]

## 2014-09-05 NOTE — Progress Notes (Signed)
Subjective: Feels as though she is breathing a little better.  No cough or chills  Objective: Vital signs in last 24 hours: Temp:  [97.5 F (36.4 C)-98.2 F (36.8 C)] 98 F (36.7 C) (05/27 0807) Pulse Rate:  [60-77] 77 (05/27 0807) Resp:  [16-20] 20 (05/27 0807) BP: (116-141)/(46-95) 127/52 mmHg (05/27 0807) SpO2:  [92 %-100 %] 99 % (05/27 0807) Weight:  [186 lb 9.6 oz (84.641 kg)] 186 lb 9.6 oz (84.641 kg) (05/27 0500) Last BM Date: 09/02/14  Intake/Output from previous day: 05/26 0701 - 05/27 0700 In: 240 [P.O.:240] Out: 1400 [Urine:1400] Intake/Output this shift: Total I/O In: 240 [P.O.:240] Out: -   Medications Scheduled Meds: . allopurinol  100 mg Oral BID  . amLODipine  10 mg Oral q morning - 10a  . aspirin EC  81 mg Oral Daily  . carvedilol  6.25 mg Oral BID WC  . furosemide  120 mg Intravenous Q12H  . heparin  5,000 Units Subcutaneous 3 times per day  . hydrALAZINE  25 mg Oral 3 times per day  . insulin aspart  0-9 Units Subcutaneous TID WC  . ipratropium-albuterol  3 mL Nebulization BID  . isosorbide mononitrate  30 mg Oral Daily  . latanoprost  1 drop Both Eyes QHS  . polyethylene glycol  17 g Oral Daily  . sodium chloride  3 mL Intravenous Q12H   Continuous Infusions:  PRN Meds:.sodium chloride, acetaminophen, albuterol, bisacodyl, docusate sodium, ondansetron (ZOFRAN) IV, sodium chloride  PE: General appearance: alert, cooperative and no distress Neck: no JVD Lungs: Decreased BS > on the right. Heart: regular rate and rhythm, S1, S2 normal, no murmur, click, rub or gallop Abdomen: +BS.[name] Tense.  Nontender Extremities: no LEE Pulses: 2+ and symmetric Skin: Warm and dry Neurologic: Grossly normal  Lab Results:   Recent Labs  09/03/14 0604  WBC 7.8  HGB 8.2*  HCT 26.2*  PLT 232   BMET  Recent Labs  09/03/14 0604 09/04/14 0339 09/05/14 0526  NA 141 143 139  K 3.6 3.9 3.5  CL 101 102 98*  CO2 28 29 30   GLUCOSE 98 83 85  BUN 66*  66* 65*  CREATININE 2.32* 2.25* 2.61*  CALCIUM 8.9 9.2 8.9    Assessment/Plan   1. Acute on chronic combined systolic and diastolic heart failure:  - Echo 4/16 showed ejection fraction Q000111Q, grade 2 diastolic dysfunction and elevated left ventricular filling pressures. - I and O -1.2L/-3.6L. On lasix drip at 10mg  /hr.   Based on CT results I think she needs further diuresis but SCr has worsened.  - Cont bb, hydral/nitrate. No acei/arb/spiro. - RHC 1/16 showed PCWP 26 and PA pressure 64/25.  -CT chest(yesterday morning before increased lasix): bilateral pleural eff.  R>L upper lobe consolidation.    CXr may ne a little worse.   2. Acute on chronic kidney disease stage IV SCr increased from yesterday.  2.25>>2.61   3. HTN: stable and well-controlled. Cont bb, hydral/nitrate/ccb.  4. DM II:   5. OSA: On CPAP.   6. Morbid obesity: advised weight reduction and life style change.   7. Normocytic anemia:Stable.   9. Pleural effusion  -SP Right thoracentesis    LOS: 8 days    HAGER, BRYAN PA-C 09/05/2014 8:43 AM As above, patient seen and examined. She denies chest pain. Her dyspnea is improving. Her chest x-ray continues to show edema. Continue Lasix at 120 mg IV twice a day. Follow renal function. Note on the  day of right heart catheterization in January of this year (during admission for acute on chronic diastolic CHF) her pulmonary capillary wedge pressure was 26 while being diuresed with BUN 70 and creatinine 2.34 (04/15/14). She has significant diastolic dysfunction and we will need to tolerate some degree of renal insufficiency to keep euvolemic. Kirk Ruths

## 2014-09-05 NOTE — Progress Notes (Signed)
Occupational Therapy Treatment Patient Details Name: Doris Howell MRN: [id number] DOB: 1935-07-12 Today's Date: 09/05/2014    History of present illness 79 y.o. female with a past medical history of diastolic congestive heart failure, hypertension, type 2 diabetes, who was hospitalized in April for congestive heart failure. She underwent a right heart catheterization in January. Currently presenting with increased SOB and dyspnea on exertion likely acute on chronic CHF.   OT comments  Pt. Making gains with skilled OT.  Performing bed mobility with cga and cues for hand placement on bed rails. Initiating breathing strategies and rest breaks appropriately.  Will continue to follow acutely.    Follow Up Recommendations  Home health OT;Other (comment)    Equipment Recommendations  3 in 1 bedside comode    Recommendations for Other Services      Precautions / Restrictions Precautions Precautions: Fall Precaution Comments: watch sats       Mobility Bed Mobility Overal bed mobility: Needs Assistance Bed Mobility: Sidelying to Sit;Supine to Sit   Sidelying to sit: Min guard Supine to sit: Min guard     General bed mobility comments: Pt movs very slowly.  Requires verbal cues for safety and hand placement on bed rails for support while transitioning to sitting  Transfers Overall transfer level: Needs assistance               General transfer comment: declined oob today    Balance                                   ADL Overall ADL's : Needs assistance/impaired Eating/Feeding: Minimal assistance;Sitting Eating/Feeding Details (indicate cue type and reason): pt. reports she is R hand dominant but is limited with use of R hand secondary to large o2 monitor attached to finger, requested help with tray b.fast set up even with cues for one handed techniques Grooming: Wash/dry hands;Sitting Grooming Details (indicate cue type and reason): min a                              Functional mobility during ADLs: Minimal assistance General ADL Comments: requried max cues and encouragement for participation in skilled OT.  offered OOB for meal pt. continue to decline stating she wanted to sit eob only.  moves slow and initated breathing tech. without cues      Vision                     Perception     Praxis      Cognition   Behavior During Therapy: [facility] for tasks assessed/performed Overall Cognitive Status: Within Functional Limits for tasks assessed                       Extremity/Trunk Assessment               Exercises     Shoulder Instructions       General Comments      Pertinent Vitals/ Pain       Pain Assessment: No/denies pain  Home Living                                          Prior Functioning/Environment  Frequency Min 2X/week     Progress Toward Goals  OT Goals(current goals can now be found in the care plan section)  Progress towards OT goals: Progressing toward goals     Plan Discharge plan remains appropriate    Co-evaluation                 End of Session Equipment Utilized During Treatment: Oxygen   Activity Tolerance Patient tolerated treatment well   Patient Left in bed;with call bell/phone within reach   Nurse Communication          Time: [id number] OT Time Calculation (min): 15 min  Charges: OT General Charges $OT Visit: 1 Procedure OT Treatments $Therapeutic Activity: 8-22 mins  Janice Coffin, COTA/L 09/05/2014, 8:07 AM

## 2014-09-05 NOTE — Progress Notes (Signed)
I know Doris Howell from prior admission.  I spoke again with she and her daughter.  Her daughter tells me that they have no new questions regarding her HF or HF recommendations.  Her mother still lives with her husband --who is legally blind.  She has no issues getting or taking medications.  However her daughter does report that sometimes "she does her own thing" in relation to diet and weights.  I encouraged them to call me with any questions or concerns related to HF after discharge.

## 2014-09-05 NOTE — Procedures (Signed)
Pt placed on hospital CPAP machine set on auto mode. Max-20, min-5 with 4L of oxygen.  Pt is resting comfortably at this time.

## 2014-09-05 NOTE — Progress Notes (Addendum)
[facility] Progress Note   Doris Howell [id number] DOB: 12-14-1935 DOA: 08/28/2014 PCP: Irven Shelling, MD  Brief narrative: Doris Howell is a 79 y.o. female with past medical history of diastolic heart failure, hypertension, diabetes mellitus who presented to the hospital for shortness of breath and was admitted for a CHF versus COPD exacerbation. She has had multiple admissions for acute diastolic CHF. There is no clear history of COPD in her records. She has had both a pulmonary and cardiac eval this year. Pulmonary did not feel that  her hypoxia had an underlying pulmonary etiology   She underwent a right heart catheterization in January and was found to have a pulmonary artery pressure of 64/25 with a mean of 40. She was thought to have biventricular heart failure with preserved LV ejection fraction. She also underwent a lexican myoview in January which showed left ventricle systolic dysfunction with EF of 44% without any ischemia.   Subjective: Awake and alert, ambulating in the hall without significant exacerbation in dyspnea. No complaints of chest pain. No new complaints specifically no nausea vomiting abdominal pain diarrhea or constipation.  Assessment/Plan: Acute hypoxic respiratory failure -currently requiring 3- 4L of oxygen- she was not requiring oxygen at home prior to admission (A) acute systolic and diastolic heart failure/ pleural effusions- EF of 45-50% with grade 2 diastolic dysfunction on echo on 4/16 - chest x-ray from 4/24 revealed bilateral effusions right greater than left - 5/24-She underwent IR guided thoracentesis obtaining 400 mL of clear yellow fluid -  5/25-repeat chest x-ray continues to show infiltrates consistent with consolidation-infection versus fluid - CT of the chest on 5/26 continues to show infiltrates-questioning pneumonia- treatment for pneumonia not initiated as patient has no cough fever or leukocytosis- infiltrates are  most likely secondary to heart failure  - Continue lasix, Imdur, hydralazine, Coreg - cardiology assisting with management -Will likely need to be discharged home with a much higher dose of Lasix due to her renal failure -Should be referred to the CHF clinic prior to discharge to help prevent further admissions-I have discussed this with case management  (B) COPD? - doubtful- given a trial of steroid-induced without improvement in dyspnea- continue duo nebs twice a day  (C) obstructive sleep apnea/ OHS -Does not use C Pap at home-apparently could not afford it-continue C Pap in hospital while sleeping -We will need to discuss with case management/social work to see how she would be able to afford a C Pap for home use  CKD stage IV -Renal ultrasound reveals chronic kidney disease -continue to follow carefully while administering Lasix -PTH noted to be elevated at 74 - Should be referred to nephrology as outpatient as she is nearing a point where she may need to be started on dialysis  Diabetes mellitus type 2 -A1c is 6.6 - continue current treatment sliding scale insulin- Amaryl is on hold  Hypertension -BP stable on current medications- Coreg, Imdur, hydralazine and Lasix  Gout -Continue allopurinol  Anemia of chronic disease -hemoglobin is at baseline -Given Aranesp on 5/23  UA positive for Escherichia coli -No symptoms of dysuria therefore will not be treating this as a UTI  Code Status: Full code Family Communication:  Disposition Plan: Continue to follow for improvement in respiratory failure- should have referral to CHF clinic prior to discharge due to her multiple admissions for CHF exacerbation DVT prophylaxis: Heparin Consultants: Nephrology, cardiology Procedures: IR guided thoracentesis 5/24  Antibiotics: Anti-infectives    None  Objective: Filed Weights   09/04/14 0443 09/04/14 0600 09/05/14 0500  Weight: 91.627 kg (202 lb) 84.9 kg (187 lb 2.7 oz)  84.641 kg (186 lb 9.6 oz)    Intake/Output Summary (Last 24 hours) at 09/05/14 1124 Last data filed at 09/05/14 [id number]  Gross per 24 hour  Intake    360 ml  Output   1400 ml  Net  -1040 ml     Vitals Filed Vitals:   09/05/14 0433 09/05/14 0500 09/05/14 0807 09/05/14 1051  BP: 121/55  127/52 119/50  Pulse: 72  77 64  Temp: 98.2 F (36.8 C)  98 F (36.7 C) 98.2 F (36.8 C)  TempSrc: Oral  Oral Oral  Resp:   20 20  Height:      Weight:  84.641 kg (186 lb 9.6 oz)    SpO2: 92%  99% 100%    Exam:  General:  Sleepy but able to be awakened- not in acute distress  HEENT: No icterus, No thrush  Cardiovascular: regular rate and rhythm, S1/S2 No murmur  Respiratory: crackles in RLL, no wheeze or rhonchi  Abdomen: Soft, +Bowel sounds, non tender, non distended, no guarding  MSK: No LE edema, cyanosis or clubbing  Data Reviewed: Basic Metabolic Panel:  Recent Labs Lab 09/01/14 0454 09/02/14 0318 09/03/14 0604 09/04/14 0339 09/05/14 0526  NA 140 141 141 143 139  K 3.8 4.0 3.6 3.9 3.5  CL 103 102 101 102 98*  CO2 28 25 28 29 30   GLUCOSE 66 110* 98 83 85  BUN 64* 69* 66* 66* 65*  CREATININE 2.77* 2.46* 2.32* 2.25* 2.61*  CALCIUM 9.0 9.2 8.9 9.2 8.9   Liver Function Tests: No results for input(s): AST, ALT, ALKPHOS, BILITOT, PROT, ALBUMIN in the last 168 hours. No results for input(s): LIPASE, AMYLASE in the last 168 hours. No results for input(s): AMMONIA in the last 168 hours. CBC:  Recent Labs Lab 08/30/14 0320 08/31/14 0559 09/01/14 0454 09/02/14 0318 09/03/14 0604  WBC 5.6 8.1 9.9 8.0 7.8  HGB 8.8* 8.3* 8.7* 8.8* 8.2*  HCT 26.4* 26.0* 26.5* 28.3* 26.2*  MCV 89.2 88.7 88.6 89.6 89.1  PLT 241 241 250 224 232   Cardiac Enzymes: No results for input(s): CKTOTAL, CKMB, CKMBINDEX, TROPONINI in the last 168 hours. BNP (last 3 results)  Recent Labs  04/15/14 0408 07/28/14 1536 08/28/14 1204  BNP 591.0* 801.5* 646.5*    ProBNP (last 3  results)  Recent Labs  01/09/14 1515 02/05/14 1216  PROBNP 2595.0* 2767.0*    CBG:  Recent Labs Lab 09/04/14 0849 09/04/14 1135 09/04/14 1624 09/04/14 2158 09/05/14 0747  GLUCAP 102* 149* 210* 140* 126*    Recent Results (from the past 240 hour(s))  Culture, Urine     Status: None   Collection Time: 08/29/14  6:48 PM  Result Value Ref Range Status   Specimen Description URINE, CATHETERIZED  Final   Special Requests NONE  Final   Colony Count   Final    >=100,000 COLONIES/ML Performed at [facility]   Final    ESCHERICHIA COLI Performed at [facility]    Report Status 08/31/2014 FINAL  Final   Organism ID, Bacteria ESCHERICHIA COLI  Final      Susceptibility   Escherichia coli - MIC*    AMPICILLIN >=32 RESISTANT Resistant     CEFAZOLIN <=4 SENSITIVE Sensitive     CEFTRIAXONE <=1 SENSITIVE Sensitive     CIPROFLOXACIN >=4 RESISTANT Resistant  GENTAMICIN <=1 SENSITIVE Sensitive     LEVOFLOXACIN >=8 RESISTANT Resistant     NITROFURANTOIN <=16 SENSITIVE Sensitive     TOBRAMYCIN <=1 SENSITIVE Sensitive     TRIMETH/SULFA <=20 SENSITIVE Sensitive     PIP/TAZO <=4 SENSITIVE Sensitive     * ESCHERICHIA COLI  MRSA PCR Screening     Status: None   Collection Time: 09/02/14 11:20 AM  Result Value Ref Range Status   MRSA by PCR NEGATIVE NEGATIVE Final    Comment:        The GeneXpert MRSA Assay (FDA approved for NASAL specimens only), is one component of a comprehensive MRSA colonization surveillance program. It is not intended to diagnose MRSA infection nor to guide or monitor treatment for MRSA infections.   Body fluid culture     Status: None (Preliminary result)   Collection Time: 09/02/14  3:30 PM  Result Value Ref Range Status   Specimen Description FLUID  Final   Special Requests PLEURAL RIGHT  Final   Gram Stain   Final    NO WBC SEEN NO ORGANISMS SEEN Performed at [facility]    Culture   Final    NO GROWTH  2 DAYS Performed at [facility]    Report Status PENDING  Incomplete     Studies: Dg Chest 2 View  09/04/2014   CLINICAL DATA:  Persistent hypoxia.  EXAM: CHEST  2 VIEW  COMPARISON:  09/02/2014  FINDINGS: The heart is enlarged but stable. Stable tortuosity and calcification of the thoracic aorta. Persistent interstitial and airspace process with probable focal bilateral infiltrates. Slight improved left upper lobe lung aeration. Suspect enlarging pleural effusions and worsening right lung base aeration.  IMPRESSION: Persistent interstitial and airspace process with bilateral pleural effusions and worsening right lung base aeration.   Electronically Signed   By: Marijo Sanes M.D.   On: 09/04/2014 12:33   Ct Chest Wo Contrast  09/04/2014   CLINICAL DATA:  Shortness of Breath/hypoxia  EXAM: CT CHEST WITHOUT CONTRAST  TECHNIQUE: Multidetector CT imaging of the chest was performed following the standard protocol without IV contrast.  COMPARISON:  Chest radiograph Sep 04, 2014  FINDINGS: There are moderate free-flowing pleural effusions bilaterally. There is extensive upper lobe airspace consolidation bilaterally, somewhat more on the right than on the left. There is bibasilar atelectasis with consolidation in the left base posteriorly.  There is cardiomegaly.  Pericardium is not thickened.  There are multiple small mediastinal lymph nodes but no adenopathy by size criteria. Calcification in the sub- carinal region is consistent with prior granulomatous disease.  There is atherosclerotic change in the aorta but no aneurysm.  Thyroid appears unremarkable.  In the visualized upper abdomen, there is atherosclerotic change in aorta. There is incomplete visualization of an apparent left adrenal adenoma measuring 1.2 x 1.0 cm.  There is degenerative change in the thoracic spine. There are no blastic or lytic bone lesions.  IMPRESSION: There is cardiomegaly with bilateral effusions. There may be a degree of  congestive heart failure.  There is airspace consolidation in both upper lobes, extensive. While this appearance may be due to alveolar edema, the appearance of these areas of airspace consolidation is more suspicious for pneumonia. There is also focal presumed pneumonia in the left base posteriorly. Note that edema and pneumonia may coexist.  No appreciable adenopathy. Areas of atherosclerotic calcification. Probable small left adrenal adenoma.   Electronically Signed   By: Lowella Grip III M.D.   On: 09/04/2014  11:24    Scheduled Meds:  Scheduled Meds: . allopurinol  100 mg Oral BID  . amLODipine  10 mg Oral q morning - 10a  . aspirin EC  81 mg Oral Daily  . carvedilol  6.25 mg Oral BID WC  . furosemide  120 mg Intravenous Q12H  . heparin  5,000 Units Subcutaneous 3 times per day  . hydrALAZINE  25 mg Oral 3 times per day  . insulin aspart  0-9 Units Subcutaneous TID WC  . ipratropium-albuterol  3 mL Nebulization BID  . isosorbide mononitrate  30 mg Oral Daily  . latanoprost  1 drop Both Eyes QHS  . polyethylene glycol  17 g Oral Daily  . sodium chloride  3 mL Intravenous Q12H   Continuous Infusions:   Time spent on care of this patient: 35 minutes   [place], MD 09/05/2014, 11:24 AM  LOS: 8 days   [facility] Office  [phone] Pager - Text Page per [email or web address]  If 7PM-7AM, please contact night-coverage [email or web address]

## 2014-09-06 DIAGNOSIS — J9600 Acute respiratory failure, unspecified whether with hypoxia or hypercapnia: Secondary | ICD-10-CM

## 2014-09-06 LAB — BODY FLUID CULTURE
Culture: NO GROWTH
Gram Stain: NONE SEEN

## 2014-09-06 LAB — BASIC METABOLIC PANEL
Anion gap: 10 (ref 5–15)
BUN: 65 mg/dL — ABNORMAL HIGH (ref 6–20)
CO2: 32 mmol/L (ref 22–32)
Calcium: 9.1 mg/dL (ref 8.9–10.3)
Chloride: 98 mmol/L — ABNORMAL LOW (ref 101–111)
Creatinine, Ser: 2.43 mg/dL — ABNORMAL HIGH (ref 0.44–1.00)
GFR calc Af Amer: 21 mL/min — ABNORMAL LOW (ref >60)
GFR calc non Af Amer: 18 mL/min — ABNORMAL LOW (ref >60)
Glucose, Bld: 93 mg/dL (ref 65–99)
Potassium: 3.6 mmol/L (ref 3.5–5.1)
Sodium: 140 mmol/L (ref 135–145)

## 2014-09-06 LAB — GLUCOSE, CAPILLARY
Glucose-Capillary: 108 mg/dL — ABNORMAL HIGH (ref 65–99)
Glucose-Capillary: 157 mg/dL — ABNORMAL HIGH (ref 65–99)
Glucose-Capillary: 115 mg/dL — ABNORMAL HIGH (ref 65–99)

## 2014-09-06 LAB — BRAIN NATRIURETIC PEPTIDE: B Natriuretic Peptide: 742.7 pg/mL — ABNORMAL HIGH (ref 0.0–100.0)

## 2014-09-06 MED ORDER — TORSEMIDE 20 MG PO TABS: 40.0000 mg | ORAL_TABLET | Freq: Two times a day (BID) | ORAL | Status: DC

## 2014-09-06 MED ORDER — TORSEMIDE 20 MG PO TABS
40.0000 mg | ORAL_TABLET | Freq: Two times a day (BID) | ORAL | Status: DC
  Administered 2014-09-06 (×2): 40 mg via ORAL
  Filled 2014-09-06 (×2): qty 2

## 2014-09-06 NOTE — Progress Notes (Signed)
Patient Name: Doris Howell      SUBJECTIVE 79 year old woman with recurrent hospitalizations over the last 6 months for diastolic heart failure, both right and left-sided.  This occurs in the context of morbid obesity and untreated sleep apnea  chest x-rays demonstrated both pulmonary edema as well as pleural effusions. She was treated for thoracentesis Bradycardia has become problematic prompting the reduction of her carvedilol dose  she has been treated with IV diuresis  initially bolus and then continuous with stable renal function.   She is feeling better than she has in months, and her ankles are toothpicks for the first time in monts  Echocardiogram 4/16 LVEF 45-50%. C Myoview 1/16 demonstrated no ischemia.  Hospital course complicated by worsening renal function;. Efforts to use CPAP has been challenged. On the night of 5/23 she was found unresponsive in bed with his SaO2 of 62%   Past Medical History  Diagnosis Date  . Hypertension   . High cholesterol   . Kidney stones   . Type II diabetes mellitus with stage 4 chronic kidney disease   . GERD (gastroesophageal reflux disease)   . History of gout   . CAP (community acquired pneumonia) 01/09/2014    "first time I've ever had it"  . Diastolic congestive heart failure     Scheduled Meds:  Scheduled Meds: . allopurinol  100 mg Oral BID  . amLODipine  10 mg Oral q morning - 10a  . aspirin EC  81 mg Oral Daily  . carvedilol  6.25 mg Oral BID WC  . furosemide  120 mg Intravenous Q12H  . heparin  5,000 Units Subcutaneous 3 times per day  . hydrALAZINE  25 mg Oral 3 times per day  . insulin aspart  0-9 Units Subcutaneous TID WC  . ipratropium-albuterol  3 mL Nebulization BID  . isosorbide mononitrate  30 mg Oral Daily  . latanoprost  1 drop Both Eyes QHS  . polyethylene glycol  17 g Oral Daily  . sodium chloride  3 mL Intravenous Q12H   Continuous Infusions:  sodium chloride, acetaminophen,  albuterol, bisacodyl, docusate sodium, ondansetron (ZOFRAN) IV, sodium chloride    PHYSICAL EXAM Filed Vitals:   09/06/14 0720 09/06/14 0733 09/06/14 0735 09/06/14 1054  BP: 115/65  116/43 116/39  Pulse: 87  74 79  Temp: 98.7 F (37.1 C)  98.8 F (37.1 C) 99.5 F (37.5 C)  TempSrc: Oral  Oral Oral  Resp: 20  18 18   Height:      Weight:      SpO2: 100% 99%  97%   Well developed and nourished in no acute distress wearing 5 L 02 HENT normal Neck supple  Clear but decrease BS at bases Regular rate and rhythm, no murmurs or gallops Abd-soft with active BS No Clubbing cyanosis edema Skin-warm and dry A & Oriented  Grossly normal sensory and motor function  TELEMETRY: Reviewed telemetry pt in *NSR**:    Intake/Output Summary (Last 24 hours) at 09/06/14 1110 Last data filed at 09/06/14 1000  Gross per 24 hour  Intake    365 ml  Output   1210 ml  Net   -845 ml    LABS: Basic Metabolic Panel:  Recent Labs Lab 08/31/14 0559 09/01/14 0454 09/02/14 0318 09/03/14 0604 09/04/14 0339 09/05/14 0526 09/06/14 0405  NA 140 140 141 141 143 139 140  K 3.8 3.8 4.0 3.6 3.9 3.5 3.6  CL 103 103 102 101  102 98* 98*  CO2 27 28 25 28 29 30  32  GLUCOSE 119* 66 110* 98 83 85 93  BUN 59* 64* 69* 66* 66* 65* 65*  CREATININE 2.78* 2.77* 2.46* 2.32* 2.25* 2.61* 2.43*  CALCIUM 9.0 9.0 9.2 8.9 9.2 8.9 9.1   Cardiac Enzymes: No results for input(s): CKTOTAL, CKMB, CKMBINDEX, TROPONINI in the last 72 hours. CBC:  Recent Labs Lab 08/31/14 0559 09/01/14 0454 09/02/14 0318 09/03/14 0604  WBC 8.1 9.9 8.0 7.8  HGB 8.3* 8.7* 8.8* 8.2*  HCT 26.0* 26.5* 28.3* 26.2*  MCV 88.7 88.6 89.6 89.1  PLT 241 250 224 232   PROTIME: No results for input(s): LABPROT, INR in the last 72 hours. Liver Function Tests: No results for input(s): AST, ALT, ALKPHOS, BILITOT, PROT, ALBUMIN in the last 72 hours. No results for input(s): LIPASE, AMYLASE in the last 72 hours. BNP: BNP (last 3  results)  Recent Labs  04/15/14 0408 07/28/14 1536 08/28/14 1204  BNP 591.0* 801.5* 646.5*    ProBNP (last 3 results)  Recent Labs  01/09/14 1515 02/05/14 1216  PROBNP 2595.0* 2767.0*       ASSESSMENT AND PLAN:  Principal Problem:   Acute on chronic combined systolic and diastolic CHF (congestive heart failure) Active Problems:   Type 2 diabetes mellitus with stage 4 chronic kidney disease   Bradycardia- beta blocker decreased   Sleep apnea   Chronic kidney disease (CKD), stage IV (severe)   Acute on chronic renal failure   Acute respiratory failure, unspecified whether with hypoxia or hypercapnia  Feeling better on high dose O2   With no edema, will recheck BNP and change lasix to torsemide w upper lung disease >?? Aspiration ? Pulmonary eval  Signed, Virl Axe MD  09/06/2014

## 2014-09-06 NOTE — Progress Notes (Signed)
[facility] PROGRESS NOTE Interim History: 79 y.o. female with past medical history of diastolic heart failure, hypertension, diabetes mellitus who presented to the hospital for shortness of breath and was admitted for a CHF versus COPD exacerbation. She has had multiple admissions for acute diastolic CHF. There is no clear history of COPD in her records. She has had both a pulmonary and cardiac eval this year. Pulmonary did not feel that her hypoxia had an underlying pulmonary etiology  She underwent a right heart catheterization in January and was found to have a pulmonary artery pressure of 64/25 with a mean of 40. She was thought to have biventricular heart failure with preserved LV ejection fraction. She also underwent a lexican myoview in January which showed left ventricle systolic dysfunction with EF of 44% without any ischemia. Filed Weights   09/04/14 0600 09/05/14 0500 09/06/14 0436  Weight: 84.9 kg (187 lb 2.7 oz) 84.641 kg (186 lb 9.6 oz) 85.322 kg (188 lb 1.6 oz)        Intake/Output Summary (Last 24 hours) at 09/06/14 1029 Last data filed at 09/06/14 0504  Gross per 24 hour  Intake      0 ml  Output   1210 ml  Net  -1210 ml     Assessment/Plan: Acute respiratory failure, unspecified whether with hypoxia or hypercapnia due to   *Acute on chronic combined systolic and diastolic CHF (congestive heart failure): - Her visit last ejection fraction was 45% with a grade 2 diastolic heart failure on echo 07/26/2014. Chest x-ray on 4.24 revealed bilateral pleural effusion right greater than left, she underwent thoracocentesis on 09/02/2014 that you'll 400 mL of clear yellow fluid. CT of the chest showed some interstitial infiltrate. - She is currently on IV furosemide with moderate diuresis, creatinine has remained stable.  COPD: Seems to be stable at baseline, continue Duonebs twice a day.  Obstructive sleep apnea/obesity hypoventilation syndrome: She is noncompliant  with her C-pap at home.  Chronic kidney disease stage IV: Renal ultrasound revealed chronic medical disease, PTH noted to be elevated she'll follow up with nephrology as an outpatient. - She'll probably need to go on a high-dose of Lasix an outpatient.  Type 2 diabetes mellitus with stage 4 chronic kidney disease: A1c was 6.6 continue current treatment she'll probably will go home on Lantus plus sliding scale due to her renal function will avoid Amaryl.  Essential hypertension: Continue Coreg, Imdur, hydralazine and Lasix.  Gout: Continue on protocol.  Anemia of chronic disease: Most likely due to renal disease hemoglobin continues to be at baseline.    Code Status: full Family Communication: none  Disposition Plan: home in 2-3 days   Consultants:  cardiology  Procedures: ECHO: none  Antibiotics:  None  HPI/Subjective: No compalins  Objective: Filed Vitals:   09/06/14 0624 09/06/14 0720 09/06/14 0733 09/06/14 0735  BP: 120/43 115/65  116/43  Pulse:  87  74  Temp:  98.7 F (37.1 C)  98.8 F (37.1 C)  TempSrc:  Oral  Oral  Resp:  20  18  Height:      Weight:      SpO2:  100% 99%      Exam:  General: Alert, awake, oriented x3, in no acute distress.  HEENT: No bruits, no goiter. +JVD Heart: Regular rate and rhythm. Lungs: Good air movement, clear Abdomen: Soft, nontender, nondistended, positive bowel sounds.  Neuro: Grossly intact, nonfocal.   Data Reviewed: Basic Metabolic Panel:  Recent Labs Lab 09/02/14 0318  09/03/14 0604 09/04/14 0339 09/05/14 0526 09/06/14 0405  NA 141 141 143 139 140  K 4.0 3.6 3.9 3.5 3.6  CL 102 101 102 98* 98*  CO2 25 28 29 30  32  GLUCOSE 110* 98 83 85 93  BUN 69* 66* 66* 65* 65*  CREATININE 2.46* 2.32* 2.25* 2.61* 2.43*  CALCIUM 9.2 8.9 9.2 8.9 9.1   Liver Function Tests: No results for input(s): AST, ALT, ALKPHOS, BILITOT, PROT, ALBUMIN in the last 168 hours. No results for input(s): LIPASE, AMYLASE in the  last 168 hours. No results for input(s): AMMONIA in the last 168 hours. CBC:  Recent Labs Lab 08/31/14 0559 09/01/14 0454 09/02/14 0318 09/03/14 0604  WBC 8.1 9.9 8.0 7.8  HGB 8.3* 8.7* 8.8* 8.2*  HCT 26.0* 26.5* 28.3* 26.2*  MCV 88.7 88.6 89.6 89.1  PLT 241 250 224 232   Cardiac Enzymes: No results for input(s): CKTOTAL, CKMB, CKMBINDEX, TROPONINI in the last 168 hours. BNP (last 3 results)  Recent Labs  04/15/14 0408 07/28/14 1536 08/28/14 1204  BNP 591.0* 801.5* 646.5*    ProBNP (last 3 results)  Recent Labs  01/09/14 1515 02/05/14 1216  PROBNP 2595.0* 2767.0*    CBG:  Recent Labs Lab 09/05/14 0747 09/05/14 1102 09/05/14 1556 09/05/14 2034 09/06/14 0727  GLUCAP 126* 172* 207* 115* 108*    Recent Results (from the past 240 hour(s))  Culture, Urine     Status: None   Collection Time: 08/29/14  6:48 PM  Result Value Ref Range Status   Specimen Description URINE, CATHETERIZED  Final   Special Requests NONE  Final   Colony Count   Final    >=100,000 COLONIES/ML Performed at [facility]   Final    ESCHERICHIA COLI Performed at [facility]    Report Status 08/31/2014 FINAL  Final   Organism ID, Bacteria ESCHERICHIA COLI  Final      Susceptibility   Escherichia coli - MIC*    AMPICILLIN >=32 RESISTANT Resistant     CEFAZOLIN <=4 SENSITIVE Sensitive     CEFTRIAXONE <=1 SENSITIVE Sensitive     CIPROFLOXACIN >=4 RESISTANT Resistant     GENTAMICIN <=1 SENSITIVE Sensitive     LEVOFLOXACIN >=8 RESISTANT Resistant     NITROFURANTOIN <=16 SENSITIVE Sensitive     TOBRAMYCIN <=1 SENSITIVE Sensitive     TRIMETH/SULFA <=20 SENSITIVE Sensitive     PIP/TAZO <=4 SENSITIVE Sensitive     * ESCHERICHIA COLI  MRSA PCR Screening     Status: None   Collection Time: 09/02/14 11:20 AM  Result Value Ref Range Status   MRSA by PCR NEGATIVE NEGATIVE Final    Comment:        The GeneXpert MRSA Assay (FDA approved for NASAL  specimens only), is one component of a comprehensive MRSA colonization surveillance program. It is not intended to diagnose MRSA infection nor to guide or monitor treatment for MRSA infections.   Body fluid culture     Status: None   Collection Time: 09/02/14  3:30 PM  Result Value Ref Range Status   Specimen Description FLUID  Final   Special Requests PLEURAL RIGHT  Final   Gram Stain   Final    NO WBC SEEN NO ORGANISMS SEEN Performed at [facility]    Culture   Final    NO GROWTH 3 DAYS Performed at [facility]    Report Status 09/06/2014 FINAL  Final  Studies: Dg Chest 2 View  09/04/2014   CLINICAL DATA:  Persistent hypoxia.  EXAM: CHEST  2 VIEW  COMPARISON:  09/02/2014  FINDINGS: The heart is enlarged but stable. Stable tortuosity and calcification of the thoracic aorta. Persistent interstitial and airspace process with probable focal bilateral infiltrates. Slight improved left upper lobe lung aeration. Suspect enlarging pleural effusions and worsening right lung base aeration.  IMPRESSION: Persistent interstitial and airspace process with bilateral pleural effusions and worsening right lung base aeration.   Electronically Signed   By: Marijo Sanes M.D.   On: 09/04/2014 12:33   Ct Chest Wo Contrast  09/04/2014   CLINICAL DATA:  Shortness of Breath/hypoxia  EXAM: CT CHEST WITHOUT CONTRAST  TECHNIQUE: Multidetector CT imaging of the chest was performed following the standard protocol without IV contrast.  COMPARISON:  Chest radiograph Sep 04, 2014  FINDINGS: There are moderate free-flowing pleural effusions bilaterally. There is extensive upper lobe airspace consolidation bilaterally, somewhat more on the right than on the left. There is bibasilar atelectasis with consolidation in the left base posteriorly.  There is cardiomegaly.  Pericardium is not thickened.  There are multiple small mediastinal lymph nodes but no adenopathy by size criteria. Calcification in  the sub- carinal region is consistent with prior granulomatous disease.  There is atherosclerotic change in the aorta but no aneurysm.  Thyroid appears unremarkable.  In the visualized upper abdomen, there is atherosclerotic change in aorta. There is incomplete visualization of an apparent left adrenal adenoma measuring 1.2 x 1.0 cm.  There is degenerative change in the thoracic spine. There are no blastic or lytic bone lesions.  IMPRESSION: There is cardiomegaly with bilateral effusions. There may be a degree of congestive heart failure.  There is airspace consolidation in both upper lobes, extensive. While this appearance may be due to alveolar edema, the appearance of these areas of airspace consolidation is more suspicious for pneumonia. There is also focal presumed pneumonia in the left base posteriorly. Note that edema and pneumonia may coexist.  No appreciable adenopathy. Areas of atherosclerotic calcification. Probable small left adrenal adenoma.   Electronically Signed   By: Lowella Grip III M.D.   On: 09/04/2014 11:24    Scheduled Meds: . allopurinol  100 mg Oral BID  . amLODipine  10 mg Oral q morning - 10a  . aspirin EC  81 mg Oral Daily  . carvedilol  6.25 mg Oral BID WC  . furosemide  120 mg Intravenous Q12H  . heparin  5,000 Units Subcutaneous 3 times per day  . hydrALAZINE  25 mg Oral 3 times per day  . insulin aspart  0-9 Units Subcutaneous TID WC  . ipratropium-albuterol  3 mL Nebulization BID  . isosorbide mononitrate  30 mg Oral Daily  . latanoprost  1 drop Both Eyes QHS  . polyethylene glycol  17 g Oral Daily  . sodium chloride  3 mL Intravenous Q12H   Continuous Infusions:    [name]  [facility] Pager [phone]  If 7PM-7AM, please contact night-coverage at [email or web address], password [facility] 09/06/2014, 10:29 AM  LOS: 9 days

## 2014-09-07 ENCOUNTER — Inpatient Hospital Stay (HOSPITAL_COMMUNITY): Payer: Medicare Other

## 2014-09-07 DIAGNOSIS — R918 Other nonspecific abnormal finding of lung field: Secondary | ICD-10-CM

## 2014-09-07 LAB — GLUCOSE, CAPILLARY
Glucose-Capillary: 166 mg/dL — ABNORMAL HIGH (ref 65–99)
Glucose-Capillary: 99 mg/dL (ref 65–99)
Glucose-Capillary: 165 mg/dL — ABNORMAL HIGH (ref 65–99)
Glucose-Capillary: 166 mg/dL — ABNORMAL HIGH (ref 65–99)
Glucose-Capillary: 184 mg/dL — ABNORMAL HIGH (ref 65–99)

## 2014-09-07 LAB — BASIC METABOLIC PANEL
Anion gap: 9 (ref 5–15)
BUN: 61 mg/dL — ABNORMAL HIGH (ref 6–20)
CO2: 35 mmol/L — ABNORMAL HIGH (ref 22–32)
Calcium: 9.4 mg/dL (ref 8.9–10.3)
Chloride: 97 mmol/L — ABNORMAL LOW (ref 101–111)
Creatinine, Ser: 2.66 mg/dL — ABNORMAL HIGH (ref 0.44–1.00)
GFR calc Af Amer: 19 mL/min — ABNORMAL LOW (ref >60)
GFR calc non Af Amer: 16 mL/min — ABNORMAL LOW (ref >60)
Glucose, Bld: 100 mg/dL — ABNORMAL HIGH (ref 65–99)
Potassium: 3.7 mmol/L (ref 3.5–5.1)
Sodium: 141 mmol/L (ref 135–145)

## 2014-09-07 MED ORDER — VANCOMYCIN HCL IN DEXTROSE 1-5 GM/200ML-% IV SOLN
1000.0000 mg | INTRAVENOUS | Status: DC
  Administered 2014-09-07 – 2014-09-09 (×2): 1000 mg via INTRAVENOUS
  Filled 2014-09-07 (×2): qty 200

## 2014-09-07 MED ORDER — TORSEMIDE 20 MG PO TABS
20.0000 mg | ORAL_TABLET | Freq: Two times a day (BID) | ORAL | Status: DC
  Administered 2014-09-07 – 2014-09-09 (×5): 20 mg via ORAL
  Filled 2014-09-07 (×5): qty 1

## 2014-09-07 MED ORDER — POTASSIUM CHLORIDE CRYS ER 20 MEQ PO TBCR
20.0000 meq | EXTENDED_RELEASE_TABLET | Freq: Two times a day (BID) | ORAL | Status: DC
  Administered 2014-09-07 – 2014-09-10 (×7): 20 meq via ORAL
  Filled 2014-09-07 (×7): qty 1

## 2014-09-07 MED ORDER — CEFEPIME HCL 1 G IJ SOLR
1.0000 g | INTRAMUSCULAR | Status: DC
  Administered 2014-09-07 – 2014-09-09 (×3): 1 g via INTRAVENOUS
  Filled 2014-09-07 (×5): qty 1

## 2014-09-07 MED ORDER — DEXTROSE 5 % IV SOLN: 1.0000 g | Freq: Three times a day (TID) | INTRAVENOUS | Status: DC

## 2014-09-07 NOTE — Progress Notes (Signed)
RN placed patient on hospital CPAP. Came and checked, patient is on Auto Titrate starting at 5.0cmH2O with a max of 20 and 2L of oxygen bled in. RT will continue to monitor

## 2014-09-07 NOTE — Progress Notes (Addendum)
[facility] PROGRESS NOTE Interim History: 79 y.o. female with past medical history of diastolic heart failure, hypertension, diabetes mellitus who presented to the hospital for shortness of breath and was admitted for a CHF versus COPD exacerbation. She has had multiple admissions for acute diastolic CHF. There is no clear history of COPD in her records. She has had both a pulmonary and cardiac eval this year. Pulmonary did not feel that her hypoxia had an underlying pulmonary etiology  She underwent a right heart catheterization in January and was found to have a pulmonary artery pressure of 64/25 with a mean of 40. She was thought to have biventricular heart failure with preserved LV ejection fraction. She also underwent a lexican myoview in January which showed left ventricle systolic dysfunction with EF of 44% without any ischemia. Filed Weights   09/05/14 0500 09/06/14 0436 09/07/14 0400  Weight: 84.641 kg (186 lb 9.6 oz) 85.322 kg (188 lb 1.6 oz) 81.8 kg (180 lb 5.4 oz)        Intake/Output Summary (Last 24 hours) at 09/07/14 1141 Last data filed at 09/07/14 0800  Gross per 24 hour  Intake    120 ml  Output   2325 ml  Net  -2205 ml     Assessment/Plan: Acute respiratory failure, unspecified whether with hypoxia or hypercapnia due to   *Acute on chronic combined systolic and diastolic CHF (congestive heart failure): - Chest x-ray on 4.24 revealed bilateral pleural effusion right greater than left, she underwent thoracocentesis on 09/02/2014 that you'll 400 mL of clear yellow fluid. - CT of the chest showed some interstitial infiltrate. - She is currently on IV furosemide with moderate diuresis, creatinine has remained stable.  COPD/HCAP: - Seems to be stable at baseline, continue Duonebs twice a day. - As Interstitial infiltrate on CXR, no hypoxic, afebrile or coughing when eating. - SLP pending, repeated CXR. - Respiratory status not improved since admission start  empiric antibiotic coverage for possible infectious etiology  Obstructive sleep apnea/obesity hypoventilation syndrome: She is noncompliant with her C-pap at home.  Chronic kidney disease stage IV: Renal ultrasound revealed chronic medical disease, PTH noted to be elevated she'll follow up with nephrology as an outpatient. - She'll probably need to go on a high-dose of Lasix an outpatient.  Type 2 diabetes mellitus with stage 4 chronic kidney disease: A1c was 6.6 continue current treatment she'll probably will go home on Lantus plus sliding scale due to her renal function will avoid Amaryl.  Essential hypertension: Continue Coreg, Imdur, hydralazine and Lasix.  Gout: Continue on protocol.  Anemia of chronic disease: Most likely due to renal disease hemoglobin continues to be at baseline.    Code Status: full Family Communication: none  Disposition Plan: home in 2-3 days   Consultants:  cardiology  Procedures: ECHO: none  Antibiotics:  None  HPI/Subjective: Some improvement but cont to be SOB.  Objective: Filed Vitals:   09/07/14 0746 09/07/14 0830 09/07/14 1058 09/07/14 1120  BP:  118/37 129/64 129/64  Pulse:  75  74  Temp:  98.2 F (36.8 C)  98.7 F (37.1 C)  TempSrc:  Oral  Oral  Resp:  18  20  Height:      Weight:      SpO2: 97% 97%  100%     Exam:  General: Alert, awake, oriented x3, in no acute distress.  HEENT: No bruits, no goiter.  Heart: Regular rate and rhythm. Lungs: Good air movement, clear Abdomen: Soft, nontender, nondistended,  positive bowel sounds.  Neuro: Grossly intact, nonfocal.   Data Reviewed: Basic Metabolic Panel:  Recent Labs Lab 09/03/14 0604 09/04/14 0339 09/05/14 0526 09/06/14 0405 09/07/14 0405  NA 141 143 139 140 141  K 3.6 3.9 3.5 3.6 3.7  CL 101 102 98* 98* 97*  CO2 28 29 30  32 35*  GLUCOSE 98 83 85 93 100*  BUN 66* 66* 65* 65* 61*  CREATININE 2.32* 2.25* 2.61* 2.43* 2.66*  CALCIUM 8.9 9.2 8.9 9.1 9.4     Liver Function Tests: No results for input(s): AST, ALT, ALKPHOS, BILITOT, PROT, ALBUMIN in the last 168 hours. No results for input(s): LIPASE, AMYLASE in the last 168 hours. No results for input(s): AMMONIA in the last 168 hours. CBC:  Recent Labs Lab 09/01/14 0454 09/02/14 0318 09/03/14 0604  WBC 9.9 8.0 7.8  HGB 8.7* 8.8* 8.2*  HCT 26.5* 28.3* 26.2*  MCV 88.6 89.6 89.1  PLT 250 224 232   Cardiac Enzymes: No results for input(s): CKTOTAL, CKMB, CKMBINDEX, TROPONINI in the last 168 hours. BNP (last 3 results)  Recent Labs  07/28/14 1536 08/28/14 1204 09/06/14 1230  BNP 801.5* 646.5* 742.7*    ProBNP (last 3 results)  Recent Labs  01/09/14 1515 02/05/14 1216  PROBNP 2595.0* 2767.0*    CBG:  Recent Labs Lab 09/06/14 1112 09/06/14 1703 09/06/14 2115 09/07/14 0720 09/07/14 1111  GLUCAP 157* 115* 166* 99 165*    Recent Results (from the past 240 hour(s))  Culture, Urine     Status: None   Collection Time: 08/29/14  6:48 PM  Result Value Ref Range Status   Specimen Description URINE, CATHETERIZED  Final   Special Requests NONE  Final   Colony Count   Final    >=100,000 COLONIES/ML Performed at [facility]   Final    ESCHERICHIA COLI Performed at [facility]    Report Status 08/31/2014 FINAL  Final   Organism ID, Bacteria ESCHERICHIA COLI  Final      Susceptibility   Escherichia coli - MIC*    AMPICILLIN >=32 RESISTANT Resistant     CEFAZOLIN <=4 SENSITIVE Sensitive     CEFTRIAXONE <=1 SENSITIVE Sensitive     CIPROFLOXACIN >=4 RESISTANT Resistant     GENTAMICIN <=1 SENSITIVE Sensitive     LEVOFLOXACIN >=8 RESISTANT Resistant     NITROFURANTOIN <=16 SENSITIVE Sensitive     TOBRAMYCIN <=1 SENSITIVE Sensitive     TRIMETH/SULFA <=20 SENSITIVE Sensitive     PIP/TAZO <=4 SENSITIVE Sensitive     * ESCHERICHIA COLI  MRSA PCR Screening     Status: None   Collection Time: 09/02/14 11:20 AM  Result Value Ref Range  Status   MRSA by PCR NEGATIVE NEGATIVE Final    Comment:        The GeneXpert MRSA Assay (FDA approved for NASAL specimens only), is one component of a comprehensive MRSA colonization surveillance program. It is not intended to diagnose MRSA infection nor to guide or monitor treatment for MRSA infections.   Body fluid culture     Status: None   Collection Time: 09/02/14  3:30 PM  Result Value Ref Range Status   Specimen Description FLUID  Final   Special Requests PLEURAL RIGHT  Final   Gram Stain   Final    NO WBC SEEN NO ORGANISMS SEEN Performed at [facility]    Culture   Final    NO GROWTH 3 DAYS Performed at [facility]    Report Status 09/06/2014 FINAL  Final     Studies: No results found.  Scheduled Meds: . allopurinol  100 mg Oral BID  . amLODipine  10 mg Oral q morning - 10a  . aspirin EC  81 mg Oral Daily  . carvedilol  6.25 mg Oral BID WC  . heparin  5,000 Units Subcutaneous 3 times per day  . hydrALAZINE  25 mg Oral 3 times per day  . insulin aspart  0-9 Units Subcutaneous TID WC  . ipratropium-albuterol  3 mL Nebulization BID  . isosorbide mononitrate  30 mg Oral Daily  . latanoprost  1 drop Both Eyes QHS  . polyethylene glycol  17 g Oral Daily  . potassium chloride  20 mEq Oral BID  . sodium chloride  3 mL Intravenous Q12H  . torsemide  20 mg Oral BID   Continuous Infusions:    [name]  [facility] Pager [phone]  If 7PM-7AM, please contact night-coverage at [email or web address], password [facility] 09/07/2014, 11:41 AM  LOS: 10 days

## 2014-09-07 NOTE — Progress Notes (Signed)
Patient Name: Doris Howell      SUBJECTIVE 79 year old woman with recurrent hospitalizations over the last 6 months for diastolic heart failure, both right and left-sided.  This occurs in the context of morbid obesity and untreated sleep apnea  chest x-rays demonstrated both pulmonary edema as well as pleural effusions. She was treated for thoracentesis Bradycardia has become problematic prompting the reduction of her carvedilol dose  she has been treated with IV diuresis  initially bolus and then continuous with stable renal function.   She is feeling better than she has in months, and her ankles are toothpicks for the first time in months Urinated briskly with torsemide  Echocardiogram 4/16 LVEF 45-50%. C Myoview 1/16 demonstrated no ischemia.  Hospital course complicated by worsening renal function;. Efforts to use CPAP has been challenging  On the night of 5/23 she was found unresponsive in bed with his SaO2 of 62%     Past Medical History  Diagnosis Date  . Hypertension   . High cholesterol   . Kidney stones   . Type II diabetes mellitus with stage 4 chronic kidney disease   . GERD (gastroesophageal reflux disease)   . History of gout   . CAP (community acquired pneumonia) 01/09/2014    "first time I've ever had it"  . Diastolic congestive heart failure     Scheduled Meds:  Scheduled Meds: . allopurinol  100 mg Oral BID  . amLODipine  10 mg Oral q morning - 10a  . aspirin EC  81 mg Oral Daily  . carvedilol  6.25 mg Oral BID WC  . heparin  5,000 Units Subcutaneous 3 times per day  . hydrALAZINE  25 mg Oral 3 times per day  . insulin aspart  0-9 Units Subcutaneous TID WC  . ipratropium-albuterol  3 mL Nebulization BID  . isosorbide mononitrate  30 mg Oral Daily  . latanoprost  1 drop Both Eyes QHS  . polyethylene glycol  17 g Oral Daily  . sodium chloride  3 mL Intravenous Q12H  . torsemide  40 mg Oral BID   Continuous Infusions:  sodium  chloride, acetaminophen, albuterol, bisacodyl, docusate sodium, ondansetron (ZOFRAN) IV, sodium chloride    PHYSICAL EXAM Filed Vitals:   09/06/14 2000 09/07/14 0000 09/07/14 0400 09/07/14 0746  BP: 122/52 144/51 119/56   Pulse: 82 70 73   Temp: 98.7 F (37.1 C)  98 F (36.7 C)   TempSrc: Oral  Oral   Resp: 18  18   Height:      Weight:   81.8 kg (180 lb 5.4 oz)   SpO2: 98% 98% 97% 97%   Well developed and nourished in no acute distress wearing 5 L 02 HENT normal Neck supple  Clear but decrease BS on R base Regular rate and rhythm, no murmurs or gallops Abd-soft with active BS No Clubbing cyanosis edema Skin-warm and dry A & Oriented  Grossly normal sensory and motor function  TELEMETRY: Reviewed telemetry pt in *NSR     Intake/Output Summary (Last 24 hours) at 09/07/14 0752 Last data filed at 09/06/14 2325  Gross per 24 hour  Intake    360 ml  Output   2100 ml  Net  -1740 ml    LABS: Basic Metabolic Panel:  Recent Labs Lab 09/01/14 0454 09/02/14 0318 09/03/14 0604 09/04/14 0339 09/05/14 0526 09/06/14 0405 09/07/14 0405  NA 140 141 141 143 139 140 141  K 3.8 4.0 3.6  3.9 3.5 3.6 3.7  CL 103 102 101 102 98* 98* 97*  CO2 28 25 28 29 30  32 35*  GLUCOSE 66 110* 98 83 85 93 100*  BUN 64* 69* 66* 66* 65* 65* 61*  CREATININE 2.77* 2.46* 2.32* 2.25* 2.61* 2.43* 2.66*  CALCIUM 9.0 9.2 8.9 9.2 8.9 9.1 9.4   Cardiac Enzymes: No results for input(s): CKTOTAL, CKMB, CKMBINDEX, TROPONINI in the last 72 hours. CBC:  Recent Labs Lab 09/01/14 0454 09/02/14 0318 09/03/14 0604  WBC 9.9 8.0 7.8  HGB 8.7* 8.8* 8.2*  HCT 26.5* 28.3* 26.2*  MCV 88.6 89.6 89.1  PLT 250 224 232   PROTIME: No results for input(s): LABPROT, INR in the last 72 hours. Liver Function Tests: No results for input(s): AST, ALT, ALKPHOS, BILITOT, PROT, ALBUMIN in the last 72 hours. No results for input(s): LIPASE, AMYLASE in the last 72 hours. BNP: BNP (last 3 results)  Recent Labs   07/28/14 1536 08/28/14 1204 09/06/14 1230  BNP 801.5* 646.5* 742.7*    ProBNP (last 3 results)  Recent Labs  01/09/14 1515 02/05/14 1216  PROBNP 2595.0* 2767.0*       ASSESSMENT AND PLAN:  Principal Problem:   Acute on chronic combined systolic and diastolic CHF (congestive heart failure) Active Problems:   Type 2 diabetes mellitus with stage 4 chronic kidney disease   Bradycardia- beta blocker decreased   Sleep apnea   Chronic kidney disease (CKD), stage IV (severe)   Acute on chronic renal failure   Acute respiratory failure, unspecified whether with hypoxia or hypercapnia alkalemia--needs KCL repletion gently Feeling better on high dose O2   With no edema, BNP still elevated??? Will down titrate the torsemide with Cr climbing Bradycardia better  w upper lung disease >?? Aspiration ? Pulmonary eval  Her O2 requirement is impressively high  Signed, Virl Axe MD  09/07/2014

## 2014-09-07 NOTE — Progress Notes (Signed)
ANTIBIOTIC CONSULT NOTE - INITIAL  Pharmacy Consult for Vancomyin Indication: pneumonia  Allergies  Allergen Reactions  . Simvastatin Other (See Comments)    Myalgias- MD took patient off med  . Codeine Other (See Comments)    Unknown; pt can't remember. It was in the '70s    Patient Measurements: Height: 5\' 1"  (154.9 cm) Weight: 180 lb 5.4 oz (81.8 kg) IBW/kg (Calculated) : 47.8  Vital Signs: Temp: 98.7 F (37.1 C) (05/29 1120) Temp Source: Oral (05/29 1120) BP: 129/64 mmHg (05/29 1120) Pulse Rate: 74 (05/29 1120) Intake/Output from previous day: 05/28 0701 - 05/29 0700 In: 485 [P.O.:485] Out: 2100 [Urine:2100] Intake/Output from this shift: Total I/O In: 120 [P.O.:120] Out: 225 [Urine:225]  Labs:  Recent Labs  09/05/14 0526 09/06/14 0405 09/07/14 0405  CREATININE 2.61* 2.43* 2.66*   Estimated Creatinine Clearance: 17.2 mL/min (by C-G formula based on Cr of 2.66). No results for input(s): VANCOTROUGH, VANCOPEAK, VANCORANDOM, GENTTROUGH, GENTPEAK, GENTRANDOM, TOBRATROUGH, TOBRAPEAK, TOBRARND, AMIKACINPEAK, AMIKACINTROU, AMIKACIN in the last 72 hours.   Microbiology: Recent Results (from the past 720 hour(s))  Culture, Urine     Status: None   Collection Time: 08/29/14  6:48 PM  Result Value Ref Range Status   Specimen Description URINE, CATHETERIZED  Final   Special Requests NONE  Final   Colony Count   Final    >=100,000 COLONIES/ML Performed at [facility]    Culture   Final    ESCHERICHIA COLI Performed at [facility]    Report Status 08/31/2014 FINAL  Final   Organism ID, Bacteria ESCHERICHIA COLI  Final      Susceptibility   Escherichia coli - MIC*    AMPICILLIN >=32 RESISTANT Resistant     CEFAZOLIN <=4 SENSITIVE Sensitive     CEFTRIAXONE <=1 SENSITIVE Sensitive     CIPROFLOXACIN >=4 RESISTANT Resistant     GENTAMICIN <=1 SENSITIVE Sensitive     LEVOFLOXACIN >=8 RESISTANT Resistant     NITROFURANTOIN <=16 SENSITIVE  Sensitive     TOBRAMYCIN <=1 SENSITIVE Sensitive     TRIMETH/SULFA <=20 SENSITIVE Sensitive     PIP/TAZO <=4 SENSITIVE Sensitive     * ESCHERICHIA COLI  MRSA PCR Screening     Status: None   Collection Time: 09/02/14 11:20 AM  Result Value Ref Range Status   MRSA by PCR NEGATIVE NEGATIVE Final    Comment:        The GeneXpert MRSA Assay (FDA approved for NASAL specimens only), is one component of a comprehensive MRSA colonization surveillance program. It is not intended to diagnose MRSA infection nor to guide or monitor treatment for MRSA infections.   Body fluid culture     Status: None   Collection Time: 09/02/14  3:30 PM  Result Value Ref Range Status   Specimen Description FLUID  Final   Special Requests PLEURAL RIGHT  Final   Gram Stain   Final    NO WBC SEEN NO ORGANISMS SEEN Performed at [facility]    Culture   Final    NO GROWTH 3 DAYS Performed at [facility]    Report Status 09/06/2014 FINAL  Final    Medical History: Past Medical History  Diagnosis Date  . Hypertension   . High cholesterol   . Kidney stones   . Type II diabetes mellitus with stage 4 chronic kidney disease   . GERD (gastroesophageal reflux disease)   . History of gout   . CAP (community acquired pneumonia)  01/09/2014    "first time I've ever had it"  . Diastolic congestive heart failure     Medications:  Scheduled:  . allopurinol  100 mg Oral BID  . amLODipine  10 mg Oral q morning - 10a  . aspirin EC  81 mg Oral Daily  . carvedilol  6.25 mg Oral BID WC  . ceFEPime (MAXIPIME) IV  1 g Intravenous 3 times per day  . heparin  5,000 Units Subcutaneous 3 times per day  . hydrALAZINE  25 mg Oral 3 times per day  . insulin aspart  0-9 Units Subcutaneous TID WC  . ipratropium-albuterol  3 mL Nebulization BID  . isosorbide mononitrate  30 mg Oral Daily  . latanoprost  1 drop Both Eyes QHS  . polyethylene glycol  17 g Oral Daily  . potassium chloride  20 mEq Oral BID   . sodium chloride  3 mL Intravenous Q12H  . torsemide  20 mg Oral BID   Infusions:   Assessment: 79 yo F admitted on 08/28/2014 with SOB 2/2 COPD v CHF exacerbation. Pharmacy has been consulted to dose empiric vancomycin for HCAP as patient's respiratory status has not improved since admission. Tmax 99.5, blood cultures sent. SCr trend up to 2.66 (CrCl ~17 ml/min).   Vanc 5/29>> Cefepime 5/29>>  5/29 BCx2>> 5/24 pleural fluid cx>>NEG 5/20 UCx>>Ecoli (R to ampicillin)  Goal of Therapy:  Vancomycin trough level 15-20 mcg/ml  Plan:  - Initiate vancomycin 1000 mg IV q48h  - Adjust Cefepime to 1 gm IV q24h - Monitor renal function, temp, WBC, C&S, vanc level as necessary  Kasim Mccorkle K. Velva Harman, PharmD, [place] Clinical Pharmacist - Resident Pager: [phone] Pharmacy: [phone] 09/07/2014 12:35 PM

## 2014-09-07 NOTE — Procedures (Signed)
Pt placed on hospital CPAP machine set on auto mode, max-20 min-5, and with 2L of oxygen.  Water add to chamber for humidity.  Pt is resting comfortably at this time.

## 2014-09-08 DIAGNOSIS — J948 Other specified pleural conditions: Secondary | ICD-10-CM

## 2014-09-08 DIAGNOSIS — J90 Pleural effusion, not elsewhere classified: Secondary | ICD-10-CM | POA: Insufficient documentation

## 2014-09-08 LAB — BASIC METABOLIC PANEL
Anion gap: 10 (ref 5–15)
BUN: 56 mg/dL — ABNORMAL HIGH (ref 6–20)
CO2: 33 mmol/L — ABNORMAL HIGH (ref 22–32)
Calcium: 9.4 mg/dL (ref 8.9–10.3)
Chloride: 98 mmol/L — ABNORMAL LOW (ref 101–111)
Creatinine, Ser: 2.54 mg/dL — ABNORMAL HIGH (ref 0.44–1.00)
GFR calc Af Amer: 20 mL/min — ABNORMAL LOW (ref >60)
GFR calc non Af Amer: 17 mL/min — ABNORMAL LOW (ref >60)
Glucose, Bld: 108 mg/dL — ABNORMAL HIGH (ref 65–99)
Potassium: 3.9 mmol/L (ref 3.5–5.1)
Sodium: 141 mmol/L (ref 135–145)

## 2014-09-08 LAB — CBC
HCT: 26.5 % — ABNORMAL LOW (ref 36.0–46.0)
Hemoglobin: 8.1 g/dL — ABNORMAL LOW (ref 12.0–15.0)
MCH: 28.1 pg (ref 26.0–34.0)
MCHC: 30.6 g/dL (ref 30.0–36.0)
MCV: 92 fL (ref 78.0–100.0)
Platelets: 262 10*3/uL (ref 150–400)
RBC: 2.88 MIL/uL — ABNORMAL LOW (ref 3.87–5.11)
RDW: 15.7 % — ABNORMAL HIGH (ref 11.5–15.5)
WBC: 6.6 10*3/uL (ref 4.0–10.5)

## 2014-09-08 LAB — GLUCOSE, CAPILLARY
Glucose-Capillary: 107 mg/dL — ABNORMAL HIGH (ref 65–99)
Glucose-Capillary: 173 mg/dL — ABNORMAL HIGH (ref 65–99)
Glucose-Capillary: 150 mg/dL — ABNORMAL HIGH (ref 65–99)
Glucose-Capillary: 165 mg/dL — ABNORMAL HIGH (ref 65–99)

## 2014-09-08 LAB — STREP PNEUMONIAE URINARY ANTIGEN: Strep Pneumo Urinary Antigen: NEGATIVE

## 2014-09-08 LAB — HIV ANTIBODY (ROUTINE TESTING W REFLEX): HIV Screen 4th Generation wRfx: NONREACTIVE

## 2014-09-08 MED ORDER — FUROSEMIDE 10 MG/ML IJ SOLN
80.0000 mg | Freq: Once | INTRAMUSCULAR | Status: AC
  Administered 2014-09-08: 80 mg via INTRAVENOUS
  Filled 2014-09-08: qty 8

## 2014-09-08 NOTE — Progress Notes (Signed)
Occupational Therapy Treatment Patient Details Name: BRETTE PICCINI MRN: [id number] DOB: 12/22/1935 Today's Date: 09/08/2014    History of present illness 79 y.o. female with a past medical history of diastolic congestive heart failure, hypertension, type 2 diabetes, who was hospitalized in April for congestive heart failure. She underwent a right heart catheterization in January. Currently presenting with increased SOB and dyspnea on exertion likely acute on chronic CHF.   OT comments  Pt requiring some encouragement to work with OT on self care, but then worked well on bathing and dressing at EOB once heat turned up in room. Continues to demonstrate decreased activity tolerance.  Moves slowly.  Encouraged up to chair, but pt declining stating she was too cold.    Follow Up Recommendations  Home health OT;Supervision - Intermittent    Equipment Recommendations  3 in 1 bedside comode    Recommendations for Other Services      Precautions / Restrictions Precautions Precautions: Fall Precaution Comments: watch sats Restrictions Weight Bearing Restrictions: No       Mobility Bed Mobility Overal bed mobility: Needs Assistance Bed Mobility: Supine to Sit   Sidelying to sit: Supervision       General bed mobility comments: moves slowly and grunts a lot, but no physical assist or cues needed  Transfers Overall transfer level: Needs assistance Equipment used: Rolling walker (2 wheeled) Transfers: Sit to/from Stand Sit to Stand: Min guard         General transfer comment: verbal cues for hand placement    Balance             Standing balance-Leahy Scale: Fair (pt able to stand without UEs to perform ADL)                     ADL Overall ADL's : Needs assistance/impaired     Grooming: Wash/dry hands;Sitting;Set up;Wash/dry face   Upper Body Bathing: Minimal assitance;Sitting Upper Body Bathing Details (indicate cue type and reason): assist for back  and under L arm  Lower Body Bathing: Minimal assistance;Sit to/from stand   Upper Body Dressing : Set up;Sitting   Lower Body Dressing: Moderate assistance;Sit to/from stand       Toileting- Water quality scientist and Hygiene: Minimal assistance;Sit to/from stand         General ADL Comments: Pt limited by IV line in R thumb and pt in R hand dominant.       Vision                     Perception     Praxis      Cognition   Behavior During Therapy: Flat affect Overall Cognitive Status: Within Functional Limits for tasks assessed                       Extremity/Trunk Assessment               Exercises     Shoulder Instructions       General Comments      Pertinent Vitals/ Pain       Pain Assessment: Faces Faces Pain Scale: Hurts a little bit Pain Location: head Pain Descriptors / Indicators: Aching Pain Intervention(s): Patient requesting pain meds-RN notified  Home Living  Prior Functioning/Environment              Frequency Min 2X/week     Progress Toward Goals  OT Goals(current goals can now be found in the care plan section)  Progress towards OT goals: Progressing toward goals  Acute Rehab OT Goals Patient Stated Goal: go home Potential to Achieve Goals: Good  Plan Discharge plan remains appropriate    Co-evaluation                 End of Session Equipment Utilized During Treatment: Oxygen;Rolling walker   Activity Tolerance Patient tolerated treatment well   Patient Left in bed;with call bell/phone within reach;with nursing/sitter in room (EOB for medications)   Nurse Communication          Time: [id number] OT Time Calculation (min): 31 min  Charges: OT General Charges $OT Visit: 1 Procedure OT Treatments $Self Care/Home Management : 23-37 mins  Malka So 09/08/2014, 10:10 AM  [phone]

## 2014-09-08 NOTE — Progress Notes (Signed)
[facility] PROGRESS NOTE Interim History: 79 y.o. female with past medical history of diastolic heart failure, hypertension, diabetes mellitus who presented to the hospital for shortness of breath and was admitted for a CHF versus COPD exacerbation. She has had multiple admissions for acute diastolic CHF. There is no clear history of COPD in her records. She has had both a pulmonary and cardiac eval this year. Pulmonary did not feel that her hypoxia had an underlying pulmonary etiology  She underwent a right heart catheterization in January and was found to have a pulmonary artery pressure of 64/25 with a mean of 40. She was thought to have biventricular heart failure with preserved LV ejection fraction. She also underwent a lexican myoview in January which showed left ventricle systolic dysfunction with EF of 44% without any ischemia. Filed Weights   09/06/14 0436 09/07/14 0400 09/08/14 0400  Weight: 85.322 kg (188 lb 1.6 oz) 81.8 kg (180 lb 5.4 oz) 85.866 kg (189 lb 4.8 oz)        Intake/Output Summary (Last 24 hours) at 09/08/14 1250 Last data filed at 09/08/14 1002  Gross per 24 hour  Intake    733 ml  Output      0 ml  Net    733 ml     Assessment/Plan: Acute respiratory failure, unspecified whether with hypoxia or hypercapnia due to   *Acute on chronic combined systolic and diastolic CHF (congestive heart failure): - Chest x-ray on 4.24 revealed bilateral pleural effusion right greater than left, she underwent thoracocentesis on 09/02/2014 that you'll 400 mL of clear yellow fluid. - Transition from lasix IV to torosemide. Repeated CXR showed persistent infiltrate. - Cr has remained stable will d/c Norvasc.  COPD/HCAP: - Seems to be stable at baseline, continue Duonebs twice a day. - SLP rec regular thin liq. CXR shoed improved aeration. - Cont vanc and cefepime.  Obstructive sleep apnea/obesity hypoventilation syndrome: She is noncompliant with her C-pap at  home.  Chronic kidney disease stage IV: Renal ultrasound revealed chronic medical disease, PTH noted to be elevated she'll follow up with nephrology as an outpatient. - She'll probably need to go on a high-dose of Lasix an outpatient.  Type 2 diabetes mellitus with stage 4 chronic kidney disease: A1c was 6.6 continue current treatment she'll probably will go home on Lantus plus sliding scale. - dc'd  Amarul due to renal function.  Essential hypertension: Continue Coreg, Imdur, hydralazine and Lasix.  Gout: Continue on protocol.  Anemia of chronic disease: Most likely due to renal disease hemoglobin continues to be at baseline.    Code Status: full Family Communication: none  Disposition Plan: home in 2 days   Consultants:  cardiology  Procedures: ECHO: none  Antibiotics:  None  HPI/Subjective: Some improvement but cont to be SOB.  Objective: Filed Vitals:   09/08/14 0812 09/08/14 0955 09/08/14 1000 09/08/14 1215  BP: 122/49 132/50 132/50 124/56  Pulse: 73  72 67  Temp: 98.1 F (36.7 C)   98.2 F (36.8 C)  TempSrc: Oral   Oral  Resp: 18  20 18   Height:      Weight:      SpO2: 97%  98% 96%     Exam:  General: Alert, awake, oriented x3, in no acute distress.  HEENT: No bruits, no goiter.  Heart: Regular rate and rhythm. Lungs: Good air movement, clear Abdomen: Soft, nontender, nondistended, positive bowel sounds.  Neuro: Grossly intact, nonfocal.   Data Reviewed: Basic Metabolic Panel:  Recent Labs Lab 09/04/14 0339 09/05/14 0526 09/06/14 0405 09/07/14 0405 09/08/14 0705  NA 143 139 140 141 141  K 3.9 3.5 3.6 3.7 3.9  CL 102 98* 98* 97* 98*  CO2 29 30 32 35* 33*  GLUCOSE 83 85 93 100* 108*  BUN 66* 65* 65* 61* 56*  CREATININE 2.25* 2.61* 2.43* 2.66* 2.54*  CALCIUM 9.2 8.9 9.1 9.4 9.4   Liver Function Tests: No results for input(s): AST, ALT, ALKPHOS, BILITOT, PROT, ALBUMIN in the last 168 hours. No results for input(s): LIPASE,  AMYLASE in the last 168 hours. No results for input(s): AMMONIA in the last 168 hours. CBC:  Recent Labs Lab 09/02/14 0318 09/03/14 0604 09/08/14 0705  WBC 8.0 7.8 6.6  HGB 8.8* 8.2* 8.1*  HCT 28.3* 26.2* 26.5*  MCV 89.6 89.1 92.0  PLT 224 232 262   Cardiac Enzymes: No results for input(s): CKTOTAL, CKMB, CKMBINDEX, TROPONINI in the last 168 hours. BNP (last 3 results)  Recent Labs  07/28/14 1536 08/28/14 1204 09/06/14 1230  BNP 801.5* 646.5* 742.7*    ProBNP (last 3 results)  Recent Labs  01/09/14 1515 02/05/14 1216  PROBNP 2595.0* 2767.0*    CBG:  Recent Labs Lab 09/07/14 1111 09/07/14 1649 09/07/14 2024 09/08/14 0732 09/08/14 1136  GLUCAP 165* 166* 184* 107* 173*    Recent Results (from the past 240 hour(s))  Culture, Urine     Status: None   Collection Time: 08/29/14  6:48 PM  Result Value Ref Range Status   Specimen Description URINE, CATHETERIZED  Final   Special Requests NONE  Final   Colony Count   Final    >=100,000 COLONIES/ML Performed at [facility]   Final    ESCHERICHIA COLI Performed at [facility]    Report Status 08/31/2014 FINAL  Final   Organism ID, Bacteria ESCHERICHIA COLI  Final      Susceptibility   Escherichia coli - MIC*    AMPICILLIN >=32 RESISTANT Resistant     CEFAZOLIN <=4 SENSITIVE Sensitive     CEFTRIAXONE <=1 SENSITIVE Sensitive     CIPROFLOXACIN >=4 RESISTANT Resistant     GENTAMICIN <=1 SENSITIVE Sensitive     LEVOFLOXACIN >=8 RESISTANT Resistant     NITROFURANTOIN <=16 SENSITIVE Sensitive     TOBRAMYCIN <=1 SENSITIVE Sensitive     TRIMETH/SULFA <=20 SENSITIVE Sensitive     PIP/TAZO <=4 SENSITIVE Sensitive     * ESCHERICHIA COLI  MRSA PCR Screening     Status: None   Collection Time: 09/02/14 11:20 AM  Result Value Ref Range Status   MRSA by PCR NEGATIVE NEGATIVE Final    Comment:        The GeneXpert MRSA Assay (FDA approved for NASAL specimens only), is one component of  a comprehensive MRSA colonization surveillance program. It is not intended to diagnose MRSA infection nor to guide or monitor treatment for MRSA infections.   Body fluid culture     Status: None   Collection Time: 09/02/14  3:30 PM  Result Value Ref Range Status   Specimen Description FLUID  Final   Special Requests PLEURAL RIGHT  Final   Gram Stain   Final    NO WBC SEEN NO ORGANISMS SEEN Performed at [facility]    Culture   Final    NO GROWTH 3 DAYS Performed at [facility]    Report Status 09/06/2014 FINAL  Final  Culture, blood (routine x 2)  Call MD if unable to obtain prior to antibiotics being given     Status: None (Preliminary result)   Collection Time: 09/07/14  3:08 PM  Result Value Ref Range Status   Specimen Description BLOOD BLOOD LEFT FOREARM  Final   Special Requests BOTTLES DRAWN AEROBIC ONLY 6CC  Final   Culture   Final           BLOOD CULTURE RECEIVED NO GROWTH TO DATE CULTURE WILL BE HELD FOR 5 DAYS BEFORE ISSUING A FINAL NEGATIVE REPORT Performed at [facility]    Report Status PENDING  Incomplete  Culture, blood (routine x 2) Call MD if unable to obtain prior to antibiotics being given     Status: None (Preliminary result)   Collection Time: 09/07/14  3:23 PM  Result Value Ref Range Status   Specimen Description BLOOD LEFT HAND  Final   Special Requests BOTTLES DRAWN AEROBIC ONLY 8CC  Final   Culture   Final           BLOOD CULTURE RECEIVED NO GROWTH TO DATE CULTURE WILL BE HELD FOR 5 DAYS BEFORE ISSUING A FINAL NEGATIVE REPORT Performed at [facility]    Report Status PENDING  Incomplete     Studies: Dg Chest Port 1 View  09/07/2014   CLINICAL DATA:  Shortness of breath and wheezing  EXAM: PORTABLE CHEST - 1 VIEW  COMPARISON:  Chest CT 09/04/2014 and chest radiograph 09/04/2014  FINDINGS: Multi lobar consolidation has improved since previously. Trace pleural effusions are smaller. Moderate enlargement of  cardiomediastinal silhouette persists with central vascular congestion.  IMPRESSION: Improved multi lobar airspace opacities which could represent improving asymmetric edema although pneumonia could appear similar. Followup PA and lateral chest X-ray is recommended in 3-4 weeks following trial of antibiotic therapy to ensure resolution and exclude underlying malignancy.   Electronically Signed   By: Conchita Paris M.D.   On: 09/07/2014 16:12    Scheduled Meds: . allopurinol  100 mg Oral BID  . amLODipine  10 mg Oral q morning - 10a  . aspirin EC  81 mg Oral Daily  . carvedilol  6.25 mg Oral BID WC  . ceFEPime (MAXIPIME) IV  1 g Intravenous Q24H  . heparin  5,000 Units Subcutaneous 3 times per day  . hydrALAZINE  25 mg Oral 3 times per day  . insulin aspart  0-9 Units Subcutaneous TID WC  . ipratropium-albuterol  3 mL Nebulization BID  . isosorbide mononitrate  30 mg Oral Daily  . latanoprost  1 drop Both Eyes QHS  . polyethylene glycol  17 g Oral Daily  . potassium chloride  20 mEq Oral BID  . sodium chloride  3 mL Intravenous Q12H  . torsemide  20 mg Oral BID  . vancomycin  1,000 mg Intravenous Q48H   Continuous Infusions:    [name]  [facility] Pager [phone]  If 7PM-7AM, please contact night-coverage at [email or web address], password [facility] 09/08/2014, 12:50 PM  LOS: 11 days

## 2014-09-08 NOTE — Progress Notes (Signed)
RT placed pt on auto titrate CPAP via ffm with 1Lpm of oxygen bled in. Sterile water is in the humidifier. Pt states she is comfortable and is tolerating CPAP well at this time. RT will continue to monitor as needed.

## 2014-09-08 NOTE — Progress Notes (Signed)
Patient Name: Doris Howell Date of Encounter: 09/08/2014     Principal Problem:   Acute on chronic combined systolic and diastolic CHF (congestive heart failure) Active Problems:   Type 2 diabetes mellitus with stage 4 chronic kidney disease   Bradycardia- beta blocker decreased   Sleep apnea   Chronic kidney disease (CKD), stage IV (severe)   Acute on chronic renal failure   Acute respiratory failure, unspecified whether with hypoxia or hypercapnia   Lung infiltrate    SUBJECTIVE  Patient denies any chest pain.  Dyspnea is improved. Remains on nasal oxygen.  CURRENT MEDS . allopurinol  100 mg Oral BID  . amLODipine  10 mg Oral q morning - 10a  . aspirin EC  81 mg Oral Daily  . carvedilol  6.25 mg Oral BID WC  . ceFEPime (MAXIPIME) IV  1 g Intravenous Q24H  . heparin  5,000 Units Subcutaneous 3 times per day  . hydrALAZINE  25 mg Oral 3 times per day  . insulin aspart  0-9 Units Subcutaneous TID WC  . ipratropium-albuterol  3 mL Nebulization BID  . isosorbide mononitrate  30 mg Oral Daily  . latanoprost  1 drop Both Eyes QHS  . polyethylene glycol  17 g Oral Daily  . potassium chloride  20 mEq Oral BID  . sodium chloride  3 mL Intravenous Q12H  . torsemide  20 mg Oral BID  . vancomycin  1,000 mg Intravenous Q48H    OBJECTIVE  Filed Vitals:   09/07/14 2302 09/08/14 0000 09/08/14 0400 09/08/14 0812  BP: 108/49 123/52 118/49 122/49  Pulse: 80 72 64 73  Temp:  98.6 F (37 C) 98 F (36.7 C) 98.1 F (36.7 C)  TempSrc:  Oral Oral Oral  Resp: 16 18 18 18   Height:      Weight:   189 lb 4.8 oz (85.866 kg)   SpO2: 97% 98% 97% 97%    Intake/Output Summary (Last 24 hours) at 09/08/14 0859 Last data filed at 09/07/14 1800  Gross per 24 hour  Intake    610 ml  Output      0 ml  Net    610 ml   Filed Weights   09/06/14 0436 09/07/14 0400 09/08/14 0400  Weight: 188 lb 1.6 oz (85.322 kg) 180 lb 5.4 oz (81.8 kg) 189 lb 4.8 oz (85.866 kg)    PHYSICAL  EXAM  General: Pleasant, NAD. Nasal oxygen in place. Neuro: Alert and oriented X 3. Moves all extremities spontaneously. Psych: Normal affect. HEENT:  Normal  Neck: Supple without bruits or JVD. Lungs:  Resp regular and unlabored, Mild decrease breath sounds at right base. Heart: RRR no s3, s4, There is grade 1/6 systolic murmur LSE. Abdomen: Soft, non-tender, non-distended, BS + x 4.  Extremities: No edema. Accessory Clinical Findings  CBC  Recent Labs  09/08/14 0705  WBC 6.6  HGB 8.1*  HCT 26.5*  MCV 92.0  PLT 99991111   Basic Metabolic Panel  Recent Labs  09/07/14 0405 09/08/14 0705  NA 141 141  K 3.7 3.9  CL 97* 98*  CO2 35* 33*  GLUCOSE 100* 108*  BUN 61* 56*  CREATININE 2.66* 2.54*  CALCIUM 9.4 9.4   Liver Function Tests No results for input(s): AST, ALT, ALKPHOS, BILITOT, PROT, ALBUMIN in the last 72 hours. No results for input(s): LIPASE, AMYLASE in the last 72 hours. Cardiac Enzymes No results for input(s): CKTOTAL, CKMB, CKMBINDEX, TROPONINI in the last 72 hours. BNP  Invalid input(s): POCBNP D-Dimer No results for input(s): DDIMER in the last 72 hours. Hemoglobin A1C No results for input(s): HGBA1C in the last 72 hours. Fasting Lipid Panel No results for input(s): CHOL, HDL, LDLCALC, TRIG, CHOLHDL, LDLDIRECT in the last 72 hours. Thyroid Function Tests No results for input(s): TSH, T4TOTAL, T3FREE, THYROIDAB in the last 72 hours.  Invalid input(s): FREET3  TELE  NSR with occasional PVCs.  ECG    Radiology/Studies  Dg Chest 1 View  09/02/2014   CLINICAL DATA:  Right pleural effusion status post thoracentesis.  EXAM: CHEST  1 VIEW  COMPARISON:  Chest radiograph 09/02/2014  FINDINGS: Monitoring leads overlie the patient. Stable cardiomegaly. Grossly unchanged right-greater-than-left mid and upper lung pulmonary consolidative opacities. Persistent small layering left pleural effusion with underlying pulmonary consolidation suggestive of associated  atelectasis. Bilateral interstitial pulmonary opacities. Interval decrease in size of right pleural effusion. No definite pneumothorax.  IMPRESSION: Persistent right-greater-than-left mid and upper lung airspace opacities potentially secondary to multi focal pneumonia.  Slight interval improvement interstitial pulmonary edema. Cardiomegaly.  Persistent small left layering pleural effusion.  Interval decrease in size right pleural effusion.   Electronically Signed   By: Lovey Newcomer M.D.   On: 09/02/2014 15:44   Dg Chest 2 View  09/04/2014   CLINICAL DATA:  Persistent hypoxia.  EXAM: CHEST  2 VIEW  COMPARISON:  09/02/2014  FINDINGS: The heart is enlarged but stable. Stable tortuosity and calcification of the thoracic aorta. Persistent interstitial and airspace process with probable focal bilateral infiltrates. Slight improved left upper lobe lung aeration. Suspect enlarging pleural effusions and worsening right lung base aeration.  IMPRESSION: Persistent interstitial and airspace process with bilateral pleural effusions and worsening right lung base aeration.   Electronically Signed   By: Marijo Sanes M.D.   On: 09/04/2014 12:33   Dg Chest 2 View  09/01/2014   CLINICAL DATA:  Shortness of breath, congestive heart failure  EXAM: CHEST  2 VIEW  COMPARISON:  08/18/2014  FINDINGS: Slight interval improvement in aeration with moderate to marked cardiomegaly and central vascular congestion reidentified. Increased moderate size pleural effusions are noted. Patchy perihilar airspace opacities are noted.  IMPRESSION: Improved aeration however increased moderate pleural effusions and patchy perihilar airspace opacities are identified likely indicating worsening edema. Other alveolar filling processes could appear similar.   Electronically Signed   By: Conchita Paris M.D.   On: 09/01/2014 13:02   Ct Chest Wo Contrast  09/04/2014   CLINICAL DATA:  Shortness of Breath/hypoxia  EXAM: CT CHEST WITHOUT CONTRAST  TECHNIQUE:  Multidetector CT imaging of the chest was performed following the standard protocol without IV contrast.  COMPARISON:  Chest radiograph Sep 04, 2014  FINDINGS: There are moderate free-flowing pleural effusions bilaterally. There is extensive upper lobe airspace consolidation bilaterally, somewhat more on the right than on the left. There is bibasilar atelectasis with consolidation in the left base posteriorly.  There is cardiomegaly.  Pericardium is not thickened.  There are multiple small mediastinal lymph nodes but no adenopathy by size criteria. Calcification in the sub- carinal region is consistent with prior granulomatous disease.  There is atherosclerotic change in the aorta but no aneurysm.  Thyroid appears unremarkable.  In the visualized upper abdomen, there is atherosclerotic change in aorta. There is incomplete visualization of an apparent left adrenal adenoma measuring 1.2 x 1.0 cm.  There is degenerative change in the thoracic spine. There are no blastic or lytic bone lesions.  IMPRESSION: There is cardiomegaly with bilateral  effusions. There may be a degree of congestive heart failure.  There is airspace consolidation in both upper lobes, extensive. While this appearance may be due to alveolar edema, the appearance of these areas of airspace consolidation is more suspicious for pneumonia. There is also focal presumed pneumonia in the left base posteriorly. Note that edema and pneumonia may coexist.  No appreciable adenopathy. Areas of atherosclerotic calcification. Probable small left adrenal adenoma.   Electronically Signed   By: Lowella Grip III M.D.   On: 09/04/2014 11:24   US Renal  08/31/2014   CLINICAL DATA:  Patient with acute renal failure. Prior left nephrectomy.  EXAM: RENAL / URINARY TRACT ULTRASOUND COMPLETE  COMPARISON:  CT 04/05/2014  FINDINGS: Right Kidney:  Length: 11.5 cm. Diffusely increased renal parenchymal echogenicity. No hydronephrosis.  Left Kidney:  Surgically absent.   Bladder:  Appears normal for degree of bladder distention.  IMPRESSION: Increased renal parenchymal echogenicity suggestive of chronic medical renal disease. No hydronephrosis.   Electronically Signed   By: Lovey Newcomer M.D.   On: 08/31/2014 21:41   Dg Chest Port 1 View  09/07/2014   CLINICAL DATA:  Shortness of breath and wheezing  EXAM: PORTABLE CHEST - 1 VIEW  COMPARISON:  Chest CT 09/04/2014 and chest radiograph 09/04/2014  FINDINGS: Multi lobar consolidation has improved since previously. Trace pleural effusions are smaller. Moderate enlargement of cardiomediastinal silhouette persists with central vascular congestion.  IMPRESSION: Improved multi lobar airspace opacities which could represent improving asymmetric edema although pneumonia could appear similar. Followup PA and lateral chest X-ray is recommended in 3-4 weeks following trial of antibiotic therapy to ensure resolution and exclude underlying malignancy.   Electronically Signed   By: Conchita Paris M.D.   On: 09/07/2014 16:12   Dg Chest Port 1 View  09/02/2014   CLINICAL DATA:  Patient with shortness of breath.  EXAM: PORTABLE CHEST - 1 VIEW  COMPARISON:  Chest radiograph 09/01/2014  FINDINGS: Monitoring leads overlie the patient. Stable cardiomegaly. Interval development of large right greater than left mid and upper lung consolidative pulmonary opacities. Small to moderate layering bilateral pleural effusions with underlying opacities suggestive of atelectasis. Bilateral predominately perihilar interstitial pulmonary opacities.  IMPRESSION: Interval development right-greater-than-left mid and upper lung pulmonary consolidation which may represent multi focal pneumonia, hemorrhage, infarct or more focal areas of consolidation due to edema.  Cardiomegaly with interstitial edema.  Layering bilateral pleural effusions with underlying atelectasis.   Electronically Signed   By: Lovey Newcomer M.D.   On: 09/02/2014 09:05   Dg Chest Port 1  View  08/28/2014   CLINICAL DATA:  Congestive heart failure.  Shortness of breath.  EXAM: PORTABLE CHEST - 1 VIEW  COMPARISON:  07/29/2014.  FINDINGS: Cardiopericardial silhouette is partially obscured by effusions, edema and atelectasis. Cardiomegaly remains present. Aortic arch atherosclerosis. Interstitial and airspace opacity with basilar predominance compatible with pulmonary edema. Bilateral pleural effusions.  IMPRESSION: Recurrent moderate CHF.   Electronically Signed   By: Dereck Ligas M.D.   On: 08/28/2014 13:02   Dg Abd 2 Views  09/02/2014   CLINICAL DATA:  Patient with abdominal distension.  EXAM: ABDOMEN - 2 VIEW  COMPARISON:  CT 04/05/2014  FINDINGS: Gas is demonstrated within nondilated loops of large and small bowel. No evidence for bowel obstruction. Decubitus images demonstrate no free intraperitoneal air. Surgical clips left mid abdomen. Cardiomegaly. Bibasilar heterogeneous opacities. Calcifications in left renal fossa.  IMPRESSION: Nonobstructed bowel gas pattern.  Cardiomegaly.  Basilar heterogeneous opacities compatible with  known pleural effusions possible atelectasis.   Electronically Signed   By: Lovey Newcomer M.D.   On: 09/02/2014 09:50   US Thoracentesis Asp Pleural Space W/img Guide  09/02/2014   CLINICAL DATA:  Pleural Effusion  EXAM: ULTRASOUND GUIDED Right THORACENTESIS  COMPARISON:  None.  PROCEDURE: An ultrasound guided thoracentesis was thoroughly discussed with the patient and questions answered. The benefits, risks, alternatives and complications were also discussed. The patient understands and wishes to proceed with the procedure. Written consent was obtained.  Ultrasound was performed to localize and mark an adequate pocket of fluid in the right chest. The area was then prepped and draped in the normal sterile fashion. 1% Lidocaine was used for local anesthesia. Under ultrasound guidance a 19 gauge Safe T centesis catheter was introduced. Thoracentesis was performed.  The catheter was removed and a dressing applied.  COMPLICATIONS: None.  FINDINGS: A total of approximately 400 mls of Clear yellow fluid was removed. A fluid sample wassent for laboratory analysis.  IMPRESSION: Successful ultrasound guided Right thoracentesis yielding 400 mls of pleural fluid.  Read By:  Gareth Eagle PA-C   Electronically Signed   By: Jacqulynn Cadet M.D.   On: 09/02/2014 15:32    ASSESSMENT AND PLAN 1. Acute on chronic combined systolic and diastolic CHF (congestive heart failure) 2.Type 2 diabetes mellitus with stage 4 chronic kidney disease 3. Chronic kidney disease (CKD), stage IV (severe)  Renal function slightly improved on lower dose torsemide.  Continue torsemide.   Signed, Darlin Coco MD

## 2014-09-08 NOTE — Progress Notes (Signed)
Physical Therapy Treatment Patient Details Name: Doris Howell MRN: [id number] DOB: October 26, 1935 Today's Date: 09/08/2014    History of Present Illness 79 y.o. female with a past medical history of diastolic congestive heart failure, hypertension, type 2 diabetes, who was hospitalized in April for congestive heart failure. She underwent a right heart catheterization in January. Currently presenting with increased SOB and dyspnea on exertion likely acute on chronic CHF.    PT Comments    Patient progressing slowly towards PT goals. Improved ambulation distance with Min guard assist for safety. Very slow gait. Able to maintain oxygen saturation >92% on 1L 02 [place]. Education provided on energy conservation techniques. Will continue to follow to maximize independence and improve endurance prior to return home.   Follow Up Recommendations  Home health PT;Supervision - Intermittent     Equipment Recommendations  Other (comment)    Recommendations for Other Services       Precautions / Restrictions Precautions Precautions: Fall Precaution Comments: watch sats Restrictions Weight Bearing Restrictions: No    Mobility  Bed Mobility Overal bed mobility: Needs Assistance Bed Mobility: Supine to Sit   Sidelying to sit: Supervision Supine to sit: Supervision;HOB elevated     General bed mobility comments: Supervision for safety but no physical assist needed. Increased time.   Transfers Overall transfer level: Needs assistance Equipment used: Rolling walker (2 wheeled) Transfers: Sit to/from Stand Sit to Stand: Min assist         General transfer comment: Min A to rise from low toilet with cues for anterior translation. Min guard to rise from EOB. CUes for hand placement. Transferred to chair post ambulation bout.  Ambulation/Gait Ambulation/Gait assistance: Min guard Ambulation Distance (Feet): 100 Feet (x2 bouts) Assistive device: Rolling walker (2 wheeled) Gait  Pattern/deviations: Decreased stride length;Step-through pattern;Trunk flexed Gait velocity: Decreased   General Gait Details: Ambulated on 1L 02 [place]. Sa02 remained >93% throughout. 2 short standing rest breaks due to fatigue. 1/4 Dyspnea.    Stairs            Wheelchair Mobility    Modified Rankin (Stroke Patients Only)       Balance Overall balance assessment: Needs assistance Sitting-balance support: Feet supported;No upper extremity supported Sitting balance-Leahy Scale: Fair     Standing balance support: During functional activity Standing balance-Leahy Scale: Fair                      Cognition Arousal/Alertness: Awake/alert Behavior During Therapy: Flat affect Overall Cognitive Status: Within Functional Limits for tasks assessed                      Exercises      General Comments        Pertinent Vitals/Pain Pain Assessment: No/denies pain Faces Pain Scale: Hurts a little bit Pain Location: head Pain Descriptors / Indicators: Aching Pain Intervention(s): Patient requesting pain meds-RN notified    Home Living                      Prior Function            PT Goals (current goals can now be found in the care plan section) Acute Rehab PT Goals Patient Stated Goal: go home Progress towards PT goals: Progressing toward goals    Frequency  Min 3X/week    PT Plan Current plan remains appropriate    Co-evaluation  End of Session Equipment Utilized During Treatment: Oxygen;Gait belt Activity Tolerance: Patient tolerated treatment well Patient left: in chair;with call bell/phone within reach     Time: 1110-1136 PT Time Calculation (min) (ACUTE ONLY): 26 min  Charges:  $Gait Training: 8-22 mins $Therapeutic Exercise: 8-22 mins                    G Codes:      Burlon Centrella A Hildreth Orsak 09/08/2014, 11:42 AM Wray Kearns, PT, DPT [phone]

## 2014-09-08 NOTE — Evaluation (Signed)
Clinical/Bedside Swallow Evaluation Patient Details  Name: Doris Howell MRN: [id number] Date of Birth: 06-Jun-1935  Today's Date: 09/08/2014 Time: SLP Start Time (ACUTE ONLY): [id number] SLP Stop Time (ACUTE ONLY): 0835 SLP Time Calculation (min) (ACUTE ONLY): 12 min  Past Medical History:  Past Medical History  Diagnosis Date  . Hypertension   . High cholesterol   . Kidney stones   . Type II diabetes mellitus with stage 4 chronic kidney disease   . GERD (gastroesophageal reflux disease)   . History of gout   . CAP (community acquired pneumonia) 01/09/2014    "first time I've ever had it"  . Diastolic congestive heart failure    Past Surgical History:  Past Surgical History  Procedure Laterality Date  . Kidney stone surgery  10-05-12    "cut me on the side"  . Colonoscopy with propofol N/A 10/23/2012    Procedure: COLONOSCOPY WITH PROPOFOL;  Surgeon: [name], MD;  Location: [facility] ENDOSCOPY;  Service: Endoscopy;  Laterality: N/A;  . Abdominal hysterectomy  ~ 1974  . Lithotripsy  1980's?  . Cataract extraction w/ intraocular lens  implant, bilateral Bilateral 2014  . Right heart catheterization N/A 04/15/2014    Procedure: RIGHT HEART CATH;  Surgeon: Sanda Klein, MD;  Location: [facility] CATH LAB;  Service: Cardiovascular;  Laterality: N/A;   HPI:  79 y.o. female with past medical history of diastolic heart failure, hypertension, diabetes mellitus who presented to the hospital for shortness of breath and was admitted for a CHF versus COPD exacerbation. She has had multiple admissions for acute diastolic CHF. There is no clear history of COPD in her records. She has had both a pulmonary and cardiac eval this year. Pulmonary did not feel that her hypoxia had an underlying pulmonary etiology   Assessment / Plan / Recommendation Clinical Impression  Pt demonstrates normal swallow function. No signs of aspiration identified. Pt may continue regular diet and thin liquids, SLP will sign  off.     Aspiration Risk  Mild    Diet Recommendation  (regular/thin)   Medication Administration: Whole meds with liquid    Other  Recommendations Oral Care Recommendations: Patient independent with oral care   Follow Up Recommendations       Frequency and Duration        Pertinent Vitals/Pain NA    SLP Swallow Goals     Swallow Study Prior Functional Status       General Other Pertinent Information: 79 y.o. female with past medical history of diastolic heart failure, hypertension, diabetes mellitus who presented to the hospital for shortness of breath and was admitted for a CHF versus COPD exacerbation. She has had multiple admissions for acute diastolic CHF. There is no clear history of COPD in her records. She has had both a pulmonary and cardiac eval this year. Pulmonary did not feel that her hypoxia had an underlying pulmonary etiology Type of Study: Bedside swallow evaluation Previous Swallow Assessment: none Diet Prior to this Study: Regular;Thin liquids Temperature Spikes Noted: No Respiratory Status: Supplemental O2 delivered via (comment) History of Recent Intubation: No Behavior/Cognition: Alert;Cooperative;Pleasant mood Oral Cavity - Dentition:  (dentures) Self-Feeding Abilities: Able to feed self Patient Positioning: Upright in bed Baseline Vocal Quality: Normal Volitional Cough: Strong Volitional Swallow: Able to elicit    Oral/Motor/Sensory Function Overall Oral Motor/Sensory Function: Appears within functional limits for tasks assessed   Ice Chips     Thin Liquid Thin Liquid: Within functional limits Presentation: Cup;Self Fed  Nectar Thick Nectar Thick Liquid: Not tested   Honey Thick Honey Thick Liquid: Not tested   Puree Puree: Not tested   Solid   GO    Solid: Within functional limits      Herbie Baltimore, MA CCC-SLP [id number]  Keevin Panebianco, Katherene Ponto 09/08/2014,8:38 AM

## 2014-09-09 LAB — BASIC METABOLIC PANEL
Anion gap: 13 (ref 5–15)
BUN: 54 mg/dL — ABNORMAL HIGH (ref 6–20)
CO2: 34 mmol/L — ABNORMAL HIGH (ref 22–32)
Calcium: 9.7 mg/dL (ref 8.9–10.3)
Chloride: 94 mmol/L — ABNORMAL LOW (ref 101–111)
Creatinine, Ser: 2.64 mg/dL — ABNORMAL HIGH (ref 0.44–1.00)
GFR calc Af Amer: 19 mL/min — ABNORMAL LOW (ref >60)
GFR calc non Af Amer: 16 mL/min — ABNORMAL LOW (ref >60)
Glucose, Bld: 247 mg/dL — ABNORMAL HIGH (ref 65–99)
Potassium: 4.4 mmol/L (ref 3.5–5.1)
Sodium: 141 mmol/L (ref 135–145)

## 2014-09-09 LAB — GLUCOSE, CAPILLARY
Glucose-Capillary: 110 mg/dL — ABNORMAL HIGH (ref 65–99)
Glucose-Capillary: 155 mg/dL — ABNORMAL HIGH (ref 65–99)
Glucose-Capillary: 163 mg/dL — ABNORMAL HIGH (ref 65–99)
Glucose-Capillary: 311 mg/dL — ABNORMAL HIGH (ref 65–99)

## 2014-09-09 LAB — LEGIONELLA ANTIGEN, URINE

## 2014-09-09 MED ORDER — TORSEMIDE 20 MG PO TABS
40.0000 mg | ORAL_TABLET | Freq: Every day | ORAL | Status: DC
  Administered 2014-09-10 – 2014-09-11 (×2): 40 mg via ORAL
  Filled 2014-09-09 (×2): qty 2

## 2014-09-09 MED ORDER — INSULIN ASPART 100 UNIT/ML ~~LOC~~ SOLN
3.0000 [IU] | Freq: Once | SUBCUTANEOUS | Status: AC
  Administered 2014-09-09: 3 [IU] via SUBCUTANEOUS

## 2014-09-09 MED ORDER — TORSEMIDE 20 MG PO TABS
20.0000 mg | ORAL_TABLET | Freq: Every day | ORAL | Status: DC
  Administered 2014-09-10: 20 mg via ORAL
  Filled 2014-09-09 (×2): qty 1

## 2014-09-09 NOTE — Progress Notes (Addendum)
Patient Name: Doris Howell Date of Encounter: 09/09/2014  Primary Cardiologist: Dr. Mare Ferrari   Principal Problem:   Acute on chronic combined systolic and diastolic CHF (congestive heart failure) Active Problems:   Type 2 diabetes mellitus with stage 4 chronic kidney disease   Bradycardia- beta blocker decreased   Chronic kidney disease (CKD), stage IV (severe)   Sleep apnea   Acute on chronic renal failure   Acute respiratory failure, unspecified whether with hypoxia or hypercapnia   Lung infiltrate   Pleural effusion on right    SUBJECTIVE  Denies any CP or SOB.   CURRENT MEDS . allopurinol  100 mg Oral BID  . aspirin EC  81 mg Oral Daily  . carvedilol  6.25 mg Oral BID WC  . ceFEPime (MAXIPIME) IV  1 g Intravenous Q24H  . heparin  5,000 Units Subcutaneous 3 times per day  . hydrALAZINE  25 mg Oral 3 times per day  . insulin aspart  0-9 Units Subcutaneous TID WC  . ipratropium-albuterol  3 mL Nebulization BID  . isosorbide mononitrate  30 mg Oral Daily  . latanoprost  1 drop Both Eyes QHS  . polyethylene glycol  17 g Oral Daily  . potassium chloride  20 mEq Oral BID  . sodium chloride  3 mL Intravenous Q12H  . torsemide  20 mg Oral BID  . vancomycin  1,000 mg Intravenous Q48H    OBJECTIVE  Filed Vitals:   09/08/14 2009 09/08/14 2201 09/08/14 2311 09/09/14 0510  BP: 123/48   126/48  Pulse: 75  79 75  Temp: 98.4 F (36.9 C)   98.6 F (37 C)  TempSrc: Oral   Oral  Resp: 18  18 18   Height:      Weight:    190 lb 7.6 oz (86.4 kg)  SpO2: 96% 93% 95% 91%    Intake/Output Summary (Last 24 hours) at 09/09/14 0745 Last data filed at 09/08/14 1800  Gross per 24 hour  Intake    723 ml  Output    200 ml  Net    523 ml   Filed Weights   09/07/14 0400 09/08/14 0400 09/09/14 0510  Weight: 180 lb 5.4 oz (81.8 kg) 189 lb 4.8 oz (85.866 kg) 190 lb 7.6 oz (86.4 kg)    PHYSICAL EXAM  General: Pleasant, NAD. Neuro: Alert and oriented X 3. Moves all  extremities spontaneously. Psych: Normal affect. HEENT:  Normal  Neck: Supple without bruits or JVD. Lungs:  Resp regular and unlabored. Mildly decreased breath sound in bilateral bases. Heart: RRR no s3, s4, or murmurs. Abdomen: Soft, non-tender, non-distended, BS + x 4.  Extremities: No clubbing, cyanosis or edema. DP/PT/Radials 2+ and equal bilaterally.  Accessory Clinical Findings  CBC  Recent Labs  09/08/14 0705  WBC 6.6  HGB 8.1*  HCT 26.5*  MCV 92.0  PLT 99991111   Basic Metabolic Panel  Recent Labs  09/07/14 0405 09/08/14 0705  NA 141 141  K 3.7 3.9  CL 97* 98*  CO2 35* 33*  GLUCOSE 100* 108*  BUN 61* 56*  CREATININE 2.66* 2.54*  CALCIUM 9.4 9.4    TELE NSR with HR 70s, frequent PVCs    ECG  No new EKG  Echocardiogram 07/31/2014  LV EF: 45% -  50%  ------------------------------------------------------------------- Indications:   Dyspnea 786.09.  ------------------------------------------------------------------- History:  PMH: Bradycardia. Obesity. Chronic kidney disease. Sleep apnea. Congestive heart failure. Risk factors: Hypertension. Diabetes mellitus.  ------------------------------------------------------------------- Study Conclusions  -  Left ventricle: The cavity size was normal. There was moderate concentric hypertrophy. Systolic function was mildly reduced. The estimated ejection fraction was in the range of 45% to 50%. Diffuse hypokinesis. Features are consistent with a pseudonormal left ventricular filling pattern, with concomitant abnormal relaxation and increased filling pressure (grade 2 diastolic dysfunction). Doppler parameters are consistent with elevated ventricular end-diastolic filling pressure. - Mitral valve: There was mild regurgitation. - Left atrium: The atrium was mildly dilated. - Right ventricle: The cavity size was normal. Wall thickness was normal. Systolic function was normal. - Right  atrium: The atrium was normal in size. - Tricuspid valve: There was no regurgitation. - Pulmonary arteries: Systolic pressure was within the normal range. - Inferior vena cava: The vessel was normal in size. - Pericardium, extracardiac: A trivial pericardial effusion was identified posterior to the heart. Features were not consistent with tamponade physiology.      ASSESSMENT AND PLAN  79 yo female with RHC in Jan concerning for biventricular failure present with multiple admission for HF.   1. Acute on chronic combined systolic and diastolic HF  - weight not accurate, I/O -5L however likely not accurate either as OUP was only in 200s for the last 2 days  - emphasized with patient the need to be compliant with Na and Fluid restriction, daily weight and call cardiology if weight increase by more than 3 lbs overnight or 5 lbs in a single week.   - current dose of demadex 20mg  BID is similar in strength to her previous home lasix 40mg  BID (it appears she was on demadex 40mg  BID with worsening renal function until 09/07/2014)  - physical exam shows she is close to baseline. If she has re-acumulation of fluid, next step would be 40mg  AM and 20mg  PM demadex. Likely can be discharged soon, however will need TOC followup given multiple readmission for HF. May also benefit from outpatient HF eval at some point.   2. Acute on chronic resp failure  - found to be unresponsive in AM of 5/24, noncompliant with CPAP  3. Acute on chronic renal insufficiency, stage IV  4. Pleural effusion s/p R thoracentesis  5. Bradycardia  6. DM 7. OSA  Signed, Almyra Deforest PA-C Pager: [phone]   The patient was seen, examined and discussed with Almyra Deforest, PA-C and I agree with the above.   The patient feels better today, there are mild crackles at the right base, I will increase torsemide to 40 QAM and 20 QPM.  The patient underwent a right heart catheterization in January and was found to have a  pulmonary artery pressure of 64/25 with a mean of 40, echo shows LVEF 45-50% and grade 2 diastolic dysfunction, normal RV systolic function. She also underwent a lexican myoview in January which showed left ventricle systolic dysfunction with EF of 44% without any ischemia. We will ask CHF service to evaluate for possible cardioMEMS device implantation as she has had multiple CHF admission in a short time period..   She is currently receiving iv ATB for HAP and will be hospitalized till Thursday.   Dorothy Spark 09/09/2014

## 2014-09-09 NOTE — Progress Notes (Addendum)
[facility] PROGRESS NOTE Interim History: 79 y.o. female with past medical history of diastolic heart failure, hypertension, diabetes mellitus who presented to the hospital for shortness of breath and was admitted for a CHF versus COPD exacerbation. She has had multiple admissions for acute diastolic CHF. There is no clear history of COPD in her records. She has had both a pulmonary and cardiac eval this year. Pulmonary did not feel that her hypoxia had an underlying pulmonary etiology  She underwent a right heart catheterization in January and was found to have a pulmonary artery pressure of 64/25 with a mean of 40. She was thought to have biventricular heart failure with preserved LV ejection fraction. She also underwent a lexican myoview in January which showed left ventricle systolic dysfunction with EF of 44% without any ischemia. Filed Weights   09/07/14 0400 09/08/14 0400 09/09/14 0510  Weight: 81.8 kg (180 lb 5.4 oz) 85.866 kg (189 lb 4.8 oz) 86.4 kg (190 lb 7.6 oz)        Intake/Output Summary (Last 24 hours) at 09/09/14 1044 Last data filed at 09/08/14 1800  Gross per 24 hour  Intake    480 ml  Output    200 ml  Net    280 ml     Assessment/Plan: Acute respiratory failure, unspecified whether with hypoxia or hypercapnia due to   *Acute on chronic combined systolic and diastolic CHF (congestive heart failure): - Chest x-ray on 4.24 revealed bilateral pleural effusion right greater than left, she underwent thoracocentesis on 09/02/2014 that you'll 400 mL of clear yellow fluid. - Cont oral torsemide. Repeated CXR showed persistent infiltrate. Appreciate cardiology assistance. - Cr has remained stable will d/c Norvasc.  COPD/HCAP: - Seems to be stable at baseline, continue Duonebs twice a day. - She relates breathing is better compare to yesterday,CXR showed improved aeration. - Cont vanc and cefepime, has remained afebrile. Will allow an additional 24 hrs of IV  antibiotics and then switch her to orals.  Obstructive sleep apnea/obesity hypoventilation syndrome: She is noncompliant with her C-pap at home.  Chronic kidney disease stage IV: Renal ultrasound revealed chronic medical disease, PTH noted to be elevated she'll follow up with nephrology as an outpatient. - She'll probably need to go on a high-dose of Lasix an outpatient.  Type 2 diabetes mellitus with stage 4 chronic kidney disease: - A1c was 6.6 continue current treatment she'll probably will go home on Lantus. - dc'd  Amarul due to renal function. Cannot use metformin due to renal function  Essential hypertension: Continue Coreg, Imdur, hydralazine and Lasix. BP at goal.  Gout: Continue on protocol.  Anemia of chronic disease: Most likely due to renal disease hemoglobin continues to be at baseline.    Code Status: full Family Communication: none  Disposition Plan: home in 24-48hrs   Consultants:  cardiology  Procedures: ECHO: none  Antibiotics:  None  HPI/Subjective: Dyspnea improved compared to yesterday  Objective: Filed Vitals:   09/08/14 2009 09/08/14 2201 09/08/14 2311 09/09/14 0510  BP: 123/48   126/48  Pulse: 75  79 75  Temp: 98.4 F (36.9 C)   98.6 F (37 C)  TempSrc: Oral   Oral  Resp: 18  18 18   Height:      Weight:    86.4 kg (190 lb 7.6 oz)  SpO2: 96% 93% 95% 91%     Exam:  General: Alert, awake, oriented x3, in no acute distress.  HEENT: No bruits, no goiter.  Heart: Regular  rate and rhythm. Lungs: Good air movement, clear Abdomen: Soft, nontender, nondistended, positive bowel sounds.  Neuro: Grossly intact, nonfocal.   Data Reviewed: Basic Metabolic Panel:  Recent Labs Lab 09/04/14 0339 09/05/14 0526 09/06/14 0405 09/07/14 0405 09/08/14 0705  NA 143 139 140 141 141  K 3.9 3.5 3.6 3.7 3.9  CL 102 98* 98* 97* 98*  CO2 29 30 32 35* 33*  GLUCOSE 83 85 93 100* 108*  BUN 66* 65* 65* 61* 56*  CREATININE 2.25* 2.61* 2.43*  2.66* 2.54*  CALCIUM 9.2 8.9 9.1 9.4 9.4   Liver Function Tests: No results for input(s): AST, ALT, ALKPHOS, BILITOT, PROT, ALBUMIN in the last 168 hours. No results for input(s): LIPASE, AMYLASE in the last 168 hours. No results for input(s): AMMONIA in the last 168 hours. CBC:  Recent Labs Lab 09/03/14 0604 09/08/14 0705  WBC 7.8 6.6  HGB 8.2* 8.1*  HCT 26.2* 26.5*  MCV 89.1 92.0  PLT 232 262   Cardiac Enzymes: No results for input(s): CKTOTAL, CKMB, CKMBINDEX, TROPONINI in the last 168 hours. BNP (last 3 results)  Recent Labs  07/28/14 1536 08/28/14 1204 09/06/14 1230  BNP 801.5* 646.5* 742.7*    ProBNP (last 3 results)  Recent Labs  01/09/14 1515 02/05/14 1216  PROBNP 2595.0* 2767.0*    CBG:  Recent Labs Lab 09/08/14 0732 09/08/14 1136 09/08/14 1636 09/08/14 2115 09/09/14 0732  GLUCAP 107* 173* 150* 165* 110*    Recent Results (from the past 240 hour(s))  MRSA PCR Screening     Status: None   Collection Time: 09/02/14 11:20 AM  Result Value Ref Range Status   MRSA by PCR NEGATIVE NEGATIVE Final    Comment:        The GeneXpert MRSA Assay (FDA approved for NASAL specimens only), is one component of a comprehensive MRSA colonization surveillance program. It is not intended to diagnose MRSA infection nor to guide or monitor treatment for MRSA infections.   Body fluid culture     Status: None   Collection Time: 09/02/14  3:30 PM  Result Value Ref Range Status   Specimen Description FLUID  Final   Special Requests PLEURAL RIGHT  Final   Gram Stain   Final    NO WBC SEEN NO ORGANISMS SEEN Performed at [facility]    Culture   Final    NO GROWTH 3 DAYS Performed at [facility]    Report Status 09/06/2014 FINAL  Final  Culture, blood (routine x 2) Call MD if unable to obtain prior to antibiotics being given     Status: None (Preliminary result)   Collection Time: 09/07/14  3:08 PM  Result Value Ref Range Status    Specimen Description BLOOD BLOOD LEFT FOREARM  Final   Special Requests BOTTLES DRAWN AEROBIC ONLY 6CC  Final   Culture   Final           BLOOD CULTURE RECEIVED NO GROWTH TO DATE CULTURE WILL BE HELD FOR 5 DAYS BEFORE ISSUING A FINAL NEGATIVE REPORT Performed at [facility]    Report Status PENDING  Incomplete  Culture, blood (routine x 2) Call MD if unable to obtain prior to antibiotics being given     Status: None (Preliminary result)   Collection Time: 09/07/14  3:23 PM  Result Value Ref Range Status   Specimen Description BLOOD LEFT HAND  Final   Special Requests BOTTLES DRAWN AEROBIC ONLY [place]  Final   Culture  Final           BLOOD CULTURE RECEIVED NO GROWTH TO DATE CULTURE WILL BE HELD FOR 5 DAYS BEFORE ISSUING A FINAL NEGATIVE REPORT Performed at [facility]    Report Status PENDING  Incomplete     Studies: Dg Chest Port 1 View  09/07/2014   CLINICAL DATA:  Shortness of breath and wheezing  EXAM: PORTABLE CHEST - 1 VIEW  COMPARISON:  Chest CT 09/04/2014 and chest radiograph 09/04/2014  FINDINGS: Multi lobar consolidation has improved since previously. Trace pleural effusions are smaller. Moderate enlargement of cardiomediastinal silhouette persists with central vascular congestion.  IMPRESSION: Improved multi lobar airspace opacities which could represent improving asymmetric edema although pneumonia could appear similar. Followup PA and lateral chest X-ray is recommended in 3-4 weeks following trial of antibiotic therapy to ensure resolution and exclude underlying malignancy.   Electronically Signed   By: Conchita Paris M.D.   On: 09/07/2014 16:12    Scheduled Meds: . allopurinol  100 mg Oral BID  . aspirin EC  81 mg Oral Daily  . carvedilol  6.25 mg Oral BID WC  . ceFEPime (MAXIPIME) IV  1 g Intravenous Q24H  . heparin  5,000 Units Subcutaneous 3 times per day  . hydrALAZINE  25 mg Oral 3 times per day  . insulin aspart  0-9 Units Subcutaneous TID WC  .  ipratropium-albuterol  3 mL Nebulization BID  . isosorbide mononitrate  30 mg Oral Daily  . latanoprost  1 drop Both Eyes QHS  . polyethylene glycol  17 g Oral Daily  . potassium chloride  20 mEq Oral BID  . sodium chloride  3 mL Intravenous Q12H  . torsemide  20 mg Oral BID  . vancomycin  1,000 mg Intravenous Q48H   Continuous Infusions:    [name]  [facility] Pager [phone]  If 7PM-7AM, please contact night-coverage at [email or web address], password [facility] 09/09/2014, 10:44 AM  LOS: 12 days

## 2014-09-09 NOTE — Progress Notes (Signed)
RT placed pt on auto titrate CPAP via ffm with 1Lpm of oxygen bled in. Sterile water was added for humidification. Pt states she is comfortable and is tolerating CPAP well at this time. RT will continue to monitor as needed.

## 2014-09-09 NOTE — Care Management Note (Addendum)
Case Management Note  Patient Details  Name: Doris Howell MRN: [id number] Date of Birth: 1935-07-24  Subjective/Objective:   Acute on chronic combined systolic and diastolic CHF (congestive heart failure). Plan for home once stable for discharge. Pt is set up with [facility] for services.    Medicare Important Message given? YES   (If response is "NO", the following Medicare IM given date fields will be blank)   Date Medicare IM given: 09-09-14  Medicare IM given by: Jacqlyn Krauss                   Action/Plan:  CM did set the pt up with the [facility] for July 19th 2016 @ *pm for sleep study. [facility] to come by and speak with pt in regards to cpap. Order placed for Cpap and MD to [place]. No further needs from CM at this time.    Expected Discharge Date:               Expected Discharge Plan:  [place]  In-House Referral:     Discharge planning Services  CM Consult  Post Acute Care Choice:    Choice offered to:     DME Arranged:   CPAP DME Agency:   [facility]  HH Arranged:  PT, RN [place] Agency:  [place]  Status of Service:  Completed, signed off  Medicare Important Message Given:  Yes Date Medicare IM Given:  09/01/14 Medicare IM give by:  Mindi Slicker RN Date Additional Medicare IM Given:  09/04/14 Additional Medicare Important Message give by:  Jacqlyn Krauss, RN,BSN   If discussed at Long Length of Stay Meetings, dates discussed:    Additional Comments:  [name], RN 09/09/2014, 10:39 AM

## 2014-09-10 DIAGNOSIS — J9601 Acute respiratory failure with hypoxia: Secondary | ICD-10-CM

## 2014-09-10 LAB — BASIC METABOLIC PANEL
Anion gap: 12 (ref 5–15)
BUN: 52 mg/dL — ABNORMAL HIGH (ref 6–20)
CO2: 32 mmol/L (ref 22–32)
Calcium: 9.6 mg/dL (ref 8.9–10.3)
Chloride: 95 mmol/L — ABNORMAL LOW (ref 101–111)
Creatinine, Ser: 2.74 mg/dL — ABNORMAL HIGH (ref 0.44–1.00)
GFR calc Af Amer: 18 mL/min — ABNORMAL LOW (ref >60)
GFR calc non Af Amer: 16 mL/min — ABNORMAL LOW (ref >60)
Glucose, Bld: 192 mg/dL — ABNORMAL HIGH (ref 65–99)
Potassium: 5 mmol/L (ref 3.5–5.1)
Sodium: 139 mmol/L (ref 135–145)

## 2014-09-10 LAB — GLUCOSE, CAPILLARY
Glucose-Capillary: 106 mg/dL — ABNORMAL HIGH (ref 65–99)
Glucose-Capillary: 169 mg/dL — ABNORMAL HIGH (ref 65–99)
Glucose-Capillary: 102 mg/dL — ABNORMAL HIGH (ref 65–99)
Glucose-Capillary: 263 mg/dL — ABNORMAL HIGH (ref 65–99)

## 2014-09-10 MED ORDER — INSULIN ASPART 100 UNIT/ML ~~LOC~~ SOLN
3.0000 [IU] | Freq: Once | SUBCUTANEOUS | Status: AC
  Administered 2014-09-10: 3 [IU] via SUBCUTANEOUS

## 2014-09-10 MED ORDER — POTASSIUM CHLORIDE CRYS ER 20 MEQ PO TBCR
20.0000 meq | EXTENDED_RELEASE_TABLET | Freq: Every day | ORAL | Status: DC
  Administered 2014-09-11: 20 meq via ORAL
  Filled 2014-09-10: qty 1

## 2014-09-10 MED ORDER — CEFUROXIME AXETIL 250 MG PO TABS
250.0000 mg | ORAL_TABLET | Freq: Two times a day (BID) | ORAL | Status: DC
  Administered 2014-09-10 – 2014-09-11 (×2): 250 mg via ORAL
  Filled 2014-09-10 (×4): qty 1

## 2014-09-10 NOTE — Progress Notes (Signed)
Pt states she does not want to wear CPAP tonight. She states it has really bothered her the past couple of nights. RT instructed patient to call if she changes her mind.

## 2014-09-10 NOTE — Progress Notes (Signed)
[place] NOTE  Pharmacy Consult for Vancomyin/cefepime Indication: HCAP  Allergies  Allergen Reactions  . Simvastatin Other (See Comments)    Myalgias- MD took patient off med  . Codeine Other (See Comments)    Unknown; pt can't remember. It was in the '70s    Patient Measurements: Height: 5\' 1"  (154.9 cm) Weight: 182 lb 1.6 oz (82.6 kg) IBW/kg (Calculated) : 47.8  Vital Signs: Temp: 98.3 F (36.8 C) (06/01 0604) Temp Source: Oral (06/01 0604) BP: 139/66 mmHg (06/01 0604) Pulse Rate: 72 (06/01 0604) Intake/Output from previous day: 05/31 0701 - 06/01 0700 In: 1310 [P.O.:1060; IV Piggyback:250] Out: -  Intake/Output from this shift:    Labs:  Recent Labs  09/08/14 0705 09/09/14 1010  WBC 6.6  --   HGB 8.1*  --   PLT 262  --   CREATININE 2.54* 2.64*   Estimated Creatinine Clearance: 17.4 mL/min (by C-G formula based on Cr of 2.64). No results for input(s): VANCOTROUGH, VANCOPEAK, VANCORANDOM, GENTTROUGH, GENTPEAK, GENTRANDOM, TOBRATROUGH, TOBRAPEAK, TOBRARND, AMIKACINPEAK, AMIKACINTROU, AMIKACIN in the last 72 hours.   Microbiology: Recent Results (from the past 720 hour(s))  Culture, Urine     Status: None   Collection Time: 08/29/14  6:48 PM  Result Value Ref Range Status   Specimen Description URINE, CATHETERIZED  Final   Special Requests NONE  Final   Colony Count   Final    >=100,000 COLONIES/ML Performed at [facility]    Culture   Final    ESCHERICHIA COLI Performed at [facility]    Report Status 08/31/2014 FINAL  Final   Organism ID, Bacteria ESCHERICHIA COLI  Final      Susceptibility   Escherichia coli - MIC*    AMPICILLIN >=32 RESISTANT Resistant     CEFAZOLIN <=4 SENSITIVE Sensitive     CEFTRIAXONE <=1 SENSITIVE Sensitive     CIPROFLOXACIN >=4 RESISTANT Resistant     GENTAMICIN <=1 SENSITIVE Sensitive     LEVOFLOXACIN >=8 RESISTANT Resistant     NITROFURANTOIN <=16 SENSITIVE Sensitive     TOBRAMYCIN <=1  SENSITIVE Sensitive     TRIMETH/SULFA <=20 SENSITIVE Sensitive     PIP/TAZO <=4 SENSITIVE Sensitive     * ESCHERICHIA COLI  MRSA PCR Screening     Status: None   Collection Time: 09/02/14 11:20 AM  Result Value Ref Range Status   MRSA by PCR NEGATIVE NEGATIVE Final    Comment:        The GeneXpert MRSA Assay (FDA approved for NASAL specimens only), is one component of a comprehensive MRSA colonization surveillance program. It is not intended to diagnose MRSA infection nor to guide or monitor treatment for MRSA infections.   Body fluid culture     Status: None   Collection Time: 09/02/14  3:30 PM  Result Value Ref Range Status   Specimen Description FLUID  Final   Special Requests PLEURAL RIGHT  Final   Gram Stain   Final    NO WBC SEEN NO ORGANISMS SEEN Performed at [facility]    Culture   Final    NO GROWTH 3 DAYS Performed at [facility]    Report Status 09/06/2014 FINAL  Final  Culture, blood (routine x 2) Call MD if unable to obtain prior to antibiotics being given     Status: None (Preliminary result)   Collection Time: 09/07/14  3:08 PM  Result Value Ref Range Status   Specimen Description BLOOD BLOOD LEFT FOREARM  Final   Special Requests BOTTLES DRAWN AEROBIC ONLY 6CC  Final   Culture   Final           BLOOD CULTURE RECEIVED NO GROWTH TO DATE CULTURE WILL BE HELD FOR 5 DAYS BEFORE ISSUING A FINAL NEGATIVE REPORT Performed at [facility]    Report Status PENDING  Incomplete  Culture, blood (routine x 2) Call MD if unable to obtain prior to antibiotics being given     Status: None (Preliminary result)   Collection Time: 09/07/14  3:23 PM  Result Value Ref Range Status   Specimen Description BLOOD LEFT HAND  Final   Special Requests BOTTLES DRAWN AEROBIC ONLY 8CC  Final   Culture   Final           BLOOD CULTURE RECEIVED NO GROWTH TO DATE CULTURE WILL BE HELD FOR 5 DAYS BEFORE ISSUING A FINAL NEGATIVE REPORT Performed at [facility]    Report Status PENDING  Incomplete   Assessment: 79 yo F admitted on 08/28/2014 with SOB 2/2 COPD v CHF exacerbation. Pharmacy has been consulted to dose empiric vancomycin for HCAP as patient's respiratory status has not improved since admission.   Afebrile overnight, blood cultures sent. SCr stable at 2.6 (CrCl ~17 ml/min). Possible switch to po abx soon, will hold off on vancomycin level for now.  Vanc 5/29>> Cefepime 5/29>>  5/29 BCx2>>ngtd 5/24 pleural fluid cx>>NEG 5/20 UCx>>Ecoli (R to ampicillin)  Goal of Therapy:  Vancomycin trough level 15-20 mcg/ml  Plan:  - Continue Vancomycin 1000 mg IV q48h -check level tomorrow if continued - Adjust Cefepime to 1 gm IV q24h - Monitor renal function, temp, WBC, C&S, vanc level as necessary  Erin Hearing PharmD., BCPS Clinical Pharmacist Pager [phone] 09/10/2014 8:03 AM

## 2014-09-10 NOTE — Progress Notes (Signed)
Physical Therapy Treatment Patient Details Name: Doris Howell MRN: [id number] DOB: 07/21/1935 Today's Date: 09/10/2014    History of Present Illness 79 y.o. female with a past medical history of diastolic congestive heart failure, hypertension, type 2 diabetes, who was hospitalized in April for congestive heart failure. She underwent a right heart catheterization in January. Currently presenting with increased SOB and dyspnea on exertion likely acute on chronic CHF.    PT Comments    Patient ambulated x 240 ' on RA, sats  93%. Standing rest break x 3.  Follow Up Recommendations  Home health PT;Supervision - Intermittent     Equipment Recommendations  None recommended by PT    Recommendations for Other Services       Precautions / Restrictions Precautions Precautions: Fall Precaution Comments: watch sats Restrictions Weight Bearing Restrictions: No    Mobility  Bed Mobility   Transfers Overall transfer level: Needs assistance Equipment used: Rolling walker (2 wheeled) Transfers: Sit to/from Stand Sit to Stand: Supervision         General transfer comment: Supervision for safety. No assist needed. No LOB  Ambulation/Gait Ambulation/Gait assistance: Min guard Ambulation Distance (Feet): 240 Feet Assistive device: Rolling walker (2 wheeled)       General Gait Details: Ambulated on RA, sats >93% through out. stopped x 3 aand rested against the wall. dyspnea 2/4   Stairs            Wheelchair Mobility    Modified Rankin (Stroke Patients Only)       Balance                                    Cognition Arousal/Alertness: Awake/alert Behavior During Therapy: Flat affect Overall Cognitive Status: Within Functional Limits for tasks assessed                      Exercises      General Comments        Pertinent Vitals/Pain Pain Assessment: No/denies pain Faces Pain Scale: No hurt    Home Living                      Prior Function            PT Goals (current goals can now be found in the care plan section) Acute Rehab PT Goals Patient Stated Goal: go home Progress towards PT goals: Progressing toward goals    Frequency  Min 3X/week    PT Plan Current plan remains appropriate    Co-evaluation             End of Session   Activity Tolerance: Patient tolerated treatment well Patient left: in chair;with call bell/phone within reach     Time: 1340-1407 PT Time Calculation (min) (ACUTE ONLY): 27 min  Charges:  $Gait Training: 23-37 mins                    G Codes:      Marcelino Freestone PT [id number]  09/10/2014, 2:23 PM

## 2014-09-10 NOTE — Progress Notes (Signed)
Dear Doctor: Algis Liming This patient has been identified as a candidate for PICC for the following reason (s): poor veins/poor circulatory system (CHF, COPD, emphysema, diabetes, steroid use, IV drug abuse, etc.) If you agree, please write an order for the indicated device. For any questions contact the Vascular Access Team at [phone] if no answer, please leave a message.  Thank you for supporting the early vascular access assessment program.

## 2014-09-10 NOTE — Progress Notes (Signed)
Patient Name: Doris Howell Date of Encounter: 09/10/2014  Primary Cardiologist: Dr. Mare Ferrari   Principal Problem:   Acute on chronic combined systolic and diastolic CHF (congestive heart failure) Active Problems:   Type 2 diabetes mellitus with stage 4 chronic kidney disease   Bradycardia- beta blocker decreased   Sleep apnea   Chronic kidney disease (CKD), stage IV (severe)   Acute on chronic renal failure   Acute respiratory failure, unspecified whether with hypoxia or hypercapnia   Lung infiltrate   Pleural effusion on right    SUBJECTIVE  Denies SOB/Orthopnea.    Creatinine 2.6  CURRENT MEDS . allopurinol  100 mg Oral BID  . aspirin EC  81 mg Oral Daily  . carvedilol  6.25 mg Oral BID WC  . ceFEPime (MAXIPIME) IV  1 g Intravenous Q24H  . heparin  5,000 Units Subcutaneous 3 times per day  . hydrALAZINE  25 mg Oral 3 times per day  . insulin aspart  0-9 Units Subcutaneous TID WC  . ipratropium-albuterol  3 mL Nebulization BID  . isosorbide mononitrate  30 mg Oral Daily  . latanoprost  1 drop Both Eyes QHS  . polyethylene glycol  17 g Oral Daily  . potassium chloride  20 mEq Oral BID  . sodium chloride  3 mL Intravenous Q12H  . torsemide  20 mg Oral QPC lunch  . torsemide  40 mg Oral Daily  . vancomycin  1,000 mg Intravenous Q48H    OBJECTIVE  Filed Vitals:   09/09/14 1934 09/09/14 2056 09/09/14 2249 09/10/14 0604  BP:  125/48  139/66  Pulse:  56 72 72  Temp:  98.7 F (37.1 C)  98.3 F (36.8 C)  TempSrc:  Oral  Oral  Resp:  18 18 18   Height:      Weight:    182 lb 1.6 oz (82.6 kg)  SpO2: 92% 95% 95% 95%    Intake/Output Summary (Last 24 hours) at 09/10/14 0746 Last data filed at 09/09/14 2000  Gross per 24 hour  Intake   1310 ml  Output      0 ml  Net   1310 ml   Filed Weights   09/08/14 0400 09/09/14 0510 09/10/14 0604  Weight: 189 lb 4.8 oz (85.866 kg) 190 lb 7.6 oz (86.4 kg) 182 lb 1.6 oz (82.6 kg)    PHYSICAL EXAM  General:  Pleasant, NAD.In bed  Neuro: Alert and oriented X 3. Moves all extremities spontaneously. Psych: Normal affect. HEENT:  Normal  Neck: Supple without bruits.  JVP 8 cm.  Lungs:  Decreased in the bases on 2 liters Branch. Heart: RRR no s3, s4, or murmurs. Abdomen: Soft, non-tender, non-distended, BS + x 4.  Extremities: No clubbing, cyanosis or edema. DP/PT/Radials 2+ and equal bilaterally.  Accessory Clinical Findings   CBC  Recent Labs  09/08/14 0705  WBC 6.6  HGB 8.1*  HCT 26.5*  MCV 92.0  PLT 99991111   Basic Metabolic Panel  Recent Labs  09/08/14 0705 09/09/14 1010  NA 141 141  K 3.9 4.4  CL 98* 94*  CO2 33* 34*  GLUCOSE 108* 247*  BUN 56* 54*  CREATININE 2.54* 2.64*  CALCIUM 9.4 9.7    TELE NSR with HR 70s, frequent PVCs    ECG  No new EKG  Echocardiogram 07/31/2014  LV EF: 45% -  50%  ------------------------------------------------------------------- Indications:   Dyspnea 786.09.  ------------------------------------------------------------------- History:  PMH: Bradycardia. Obesity. Chronic kidney disease. Sleep apnea. Congestive  heart failure. Risk factors: Hypertension. Diabetes mellitus.  ------------------------------------------------------------------- Study Conclusions  - Left ventricle: The cavity size was normal. There was moderate concentric hypertrophy. Systolic function was mildly reduced. The estimated ejection fraction was in the range of 45% to 50%. Diffuse hypokinesis. Features are consistent with a pseudonormal left ventricular filling pattern, with concomitant abnormal relaxation and increased filling pressure (grade 2 diastolic dysfunction). Doppler parameters are consistent with elevated ventricular end-diastolic filling pressure. - Mitral valve: There was mild regurgitation. - Left atrium: The atrium was mildly dilated. - Right ventricle: The cavity size was normal. Wall thickness was normal. Systolic  function was normal. - Right atrium: The atrium was normal in size. - Tricuspid valve: There was no regurgitation. - Pulmonary arteries: Systolic pressure was within the normal range. - Inferior vena cava: The vessel was normal in size. - Pericardium, extracardiac: A trivial pericardial effusion was identified posterior to the heart. Features were not consistent with tamponade physiology.      ASSESSMENT AND PLAN 79 yo female with RHC in Jan concerning for biventricular failure present with multiple admission for HF.  1. Acute on chronic combined systolic and diastolic HF- Grade II DD  EF 45-50% Normal RV  Had lexiscan January 2016 --> EF 44% no evidence of ischemia Diuresed with IV lasix and transitioned to torsemide 40 mg/20 mg . Volume status stable. Continue current dose of torsemide. Diuresed 12 pounds. Continue current dose of hydralazine/imdur. No ace or spiro with CKD Prior to admit had been on lasix 40 mg bid. Creatinine up above baseline of 2.2> 2.6   Cardiomems is a consideration however GFR to low to pursue.  2. Acute on chronic resp failure - found to be unresponsive in AM of 5/24, noncompliant with CPAP. ? Says she doesn't have CPAP  3. Acute on chronic renal insufficiency, stage IV- Solitary kidney Creatinine baseline 2.2  4. Pleural effusion s/p R thoracentesis 5. Bradycardia- Beta blocker cut back on 5/25--> Carvedilol now at 6.25 mg twice a day. HR 50-70s.  6. DM 7. OSA- CPAP at night while hospitalized. She does not wear at home . Needs outpatient pulmonary follow up. Needs sleep study  8. HCAP- on antibiotics per primary team  9. Anemia- normal colonoscopy 07/2014. Last iron level January 2016. Check anemia panel. No source of bleeding.   Will need follow up in the HF clinic  CLEGG,AMY NP-C  Advanced Heart Failure Team Pager [phone] (M-F; 7a - 4p)  Please contact [facility] for night-coverage after hours (4p -7a ) and weekends on  [email or web address]  Patient seen with NP, agree with the above note.  She was admitted with possible HCAP and acute on chronic primarily diastolic CHF.  Now on po torsemide.  Volume is improved.  I/Os not recorded.  Has had trouble affording CPAP at home => would see if care management can help.    We were asked to evaluate for possible Cardiomems.  At this point, her GFR is too low to qualify.  Will continue to follow her over time.   [name] 09/10/2014 1:20 PM

## 2014-09-10 NOTE — Progress Notes (Signed)
Occupational Therapy Treatment Patient Details Name: Doris Howell MRN: [id number] DOB: 05/10/1935 Today's Date: 09/10/2014    History of present illness 79 y.o. female with a past medical history of diastolic congestive heart failure, hypertension, type 2 diabetes, who was hospitalized in April for congestive heart failure. She underwent a right heart catheterization in January. Currently presenting with increased SOB and dyspnea on exertion likely acute on chronic CHF.   OT comments  Pt is progressing with endurance and participated fully in self-care and bathing tasks today. Pt will continue to benefit from acute OT to progress with independence and endurance.    Follow Up Recommendations  Home health OT;Supervision - Intermittent    Equipment Recommendations  3 in 1 bedside comode    Recommendations for Other Services      Precautions / Restrictions Precautions Precautions: Fall Precaution Comments: watch sats Restrictions Weight Bearing Restrictions: No       Mobility Bed Mobility Overal bed mobility: Needs Assistance Bed Mobility: Supine to Sit     Supine to sit: Supervision     General bed mobility comments: Supervision for safety but no physical assist needed. Increased time.   Transfers Overall transfer level: Needs assistance Equipment used: Rolling walker (2 wheeled) Transfers: Sit to/from Stand Sit to Stand: Supervision         General transfer comment: Supervision for safety. No assist needed. No LOB        ADL Overall ADL's : Needs assistance/impaired     Grooming: Wash/dry hands;Sitting;Set up;Wash/dry face   Upper Body Bathing: Minimal assitance;Sitting Upper Body Bathing Details (indicate cue type and reason): assist for back and under L arm  Lower Body Bathing: Minimal assistance;Sit to/from stand Lower Body Bathing Details (indicate cue type and reason): assist for perineal care Upper Body Dressing : Set up;Sitting   Lower Body  Dressing: Minimal assistance;Sit to/from stand   Toilet Transfer: Supervision/safety;Ambulation;RW;Comfort height toilet Toilet Transfer Details (indicate cue type and reason): supervision and assist for O2 line Toileting- Clothing Manipulation and Hygiene: Supervision/safety;Sit to/from stand       Functional mobility during ADLs: Supervision/safety;Rolling walker General ADL Comments: Pt performed sponge bath and ambulated to bathroom. Pt demonstrates improving endurance and improved sit<>stand.                 Cognition   Behavior During Therapy: WFL for tasks assessed/performed Overall Cognitive Status: Within Functional Limits for tasks assessed                                    Pertinent Vitals/ Pain       Pain Assessment: No/denies pain         Frequency Min 2X/week     Progress Toward Goals  OT Goals(current goals can now be found in the care plan section)  Progress towards OT goals: Progressing toward goals  Acute Rehab OT Goals Patient Stated Goal: go home OT Goal Formulation: With patient/family Time For Goal Achievement: 09/05/14 Potential to Achieve Goals: Good  Plan Discharge plan remains appropriate       End of Session Equipment Utilized During Treatment: Oxygen;Rolling walker   Activity Tolerance Patient tolerated treatment well   Patient Left Other (comment);with call bell/phone within reach;with family/visitor present (EOB)   Nurse Communication Other (comment) (pt sitting EOB for meal)        Time: 1130-1210 OT Time Calculation (min): 40 min  Charges: OT General  Charges $OT Visit: 1 Procedure OT Treatments $Self Care/Home Management : 38-52 mins  Juluis Rainier 09/10/2014, 1:31 PM  Cyndie Chime, OTR/L Occupational Therapist [phone] (pager)

## 2014-09-10 NOTE — Progress Notes (Addendum)
[facility] PROGRESS NOTE Interim History: 79 y.o. female with past medical history of diastolic heart failure, hypertension, diabetes mellitus who presented to the hospital for shortness of breath and was admitted for a CHF versus COPD exacerbation. She has had multiple admissions for acute diastolic CHF. There is no clear history of COPD in her records. She has had both a pulmonary and cardiac eval this year. Pulmonary did not feel that her hypoxia had an underlying pulmonary etiology  She underwent a right heart catheterization in January and was found to have a pulmonary artery pressure of 64/25 with a mean of 40. She was thought to have biventricular heart failure with preserved LV ejection fraction. She also underwent a lexican myoview in January which showed left ventricle systolic dysfunction with EF of 44% without any ischemia.   Filed Weights   09/08/14 0400 09/09/14 0510 09/10/14 0604  Weight: 85.866 kg (189 lb 4.8 oz) 86.4 kg (190 lb 7.6 oz) 82.6 kg (182 lb 1.6 oz)        Intake/Output Summary (Last 24 hours) at 09/10/14 1638 Last data filed at 09/09/14 2000  Gross per 24 hour  Intake    480 ml  Output      0 ml  Net    480 ml     Assessment/Plan: Acute respiratory failure with hypoxia due to Acute on chronic combined systolic and diastolic CHF: - EF: [id number] percent and normal RV. Lexiscan Myoview January 2016: EF 44% and no evidence of ischemia. - Cardiology consulted and managing - Diuresed with IV Lasix and then transitioned to torsemide 40 mg a.m./20 mg p.m. - As per cardiology/advanced CHF team: Continue current dose of torsemide, hydralazine/Imdur, no ACEI or Aldactone due to CKD - Patient's creatinine has increased from baseline of 2.2-2.6 which may have to be accepted  COPD/HCAP: - Patient not on home O2. - Patient oxygen requirement has gradually come down to 1 L/m - Treated empirically with IV vancomycin and cefepime since 5/29. Blood cultures 2:  Negative to date. - We will switch to oral Ceftin on 6/1 and complete total 8 days course. - Not entirely sure if all her presentation was related to pulmonary edema versus pneumonia   Obstructive sleep apnea/obesity hypoventilation syndrome: - She is noncompliant with her C-pap at home-states financial issues. - Has been getting CPAP at bedtime in the hospital. May have to go home on auto titrate CPAP  Acute on Chronic kidney disease stage IV: - Renal ultrasound revealed chronic medical disease, PTH noted to be elevated she'll follow up with nephrology as an outpatient. - Creatinine slightly worse than baseline, due to diuresis. Will need close outpatient follow-up - Solitary kidney. Baseline creatinine 2.2  Type 2 diabetes mellitus with stage 4 chronic kidney disease: - A1c was 6.6 continue current treatment she'll probably will go home on Lantus. - dc'd  Amaryl due to renal function. Cannot use metformin due to renal function  Essential hypertension: - Continue Coreg, Imdur, hydralazine and Lasix. - BP at goal.  Gout: stable  Anemia of chronic disease: Most likely due to renal disease hemoglobin continues to be at baseline. - Stable  OSA - CPAP as indicated above  Status post right thoracentesis - Pleural fluid culture: Negative    DVT prophylaxis: Subcutaneous heparin Code Status: full Family Communication: none  Disposition Plan: home in 24 hrs   Consultants:  Cardiology  Procedures: ECHO: none Right thoracentesis  Antibiotics:  Cefepime 5/29 >  IV vancomycin 5/29 >  HPI/Subjective: Patient states that she is feeling much better compared to admission. Denies dyspnea. States not using CPAP at home due to financial constraints  Objective: Filed Vitals:   09/10/14 0604 09/10/14 0941 09/10/14 1011 09/10/14 1300  BP: 139/66 116/49  113/45  Pulse: 72 71  82  Temp: 98.3 F (36.8 C)   98 F (36.7 C)  TempSrc: Oral   Oral  Resp: 18   18  Height:        Weight: 82.6 kg (182 lb 1.6 oz)     SpO2: 95%  97% 100%     Exam:  General: Alert, awake, oriented x3, in no acute distress.  HEENT: No bruits, no goiter.  Heart: Regular rate and rhythm. Telemetry: Sinus rhythm with occasional PVCs and trigeminy Lungs: Diminished breath sounds in the bases but otherwise clear to auscultation. No increased work of breathing. Abdomen: Soft, nontender, nondistended, positive bowel sounds.  Neuro: Grossly intact, nonfocal.   Data Reviewed: Basic Metabolic Panel:  Recent Labs Lab 09/06/14 0405 09/07/14 0405 09/08/14 0705 09/09/14 1010 09/10/14 1511  NA 140 141 141 141 139  K 3.6 3.7 3.9 4.4 5.0  CL 98* 97* 98* 94* 95*  CO2 32 35* 33* 34* 32  GLUCOSE 93 100* 108* 247* 192*  BUN 65* 61* 56* 54* 52*  CREATININE 2.43* 2.66* 2.54* 2.64* 2.74*  CALCIUM 9.1 9.4 9.4 9.7 9.6   Liver Function Tests: No results for input(s): AST, ALT, ALKPHOS, BILITOT, PROT, ALBUMIN in the last 168 hours. No results for input(s): LIPASE, AMYLASE in the last 168 hours. No results for input(s): AMMONIA in the last 168 hours. CBC:  Recent Labs Lab 09/08/14 0705  WBC 6.6  HGB 8.1*  HCT 26.5*  MCV 92.0  PLT 262   Cardiac Enzymes: No results for input(s): CKTOTAL, CKMB, CKMBINDEX, TROPONINI in the last 168 hours. BNP (last 3 results)  Recent Labs  07/28/14 1536 08/28/14 1204 09/06/14 1230  BNP 801.5* 646.5* 742.7*    ProBNP (last 3 results)  Recent Labs  01/09/14 1515 02/05/14 1216  PROBNP 2595.0* 2767.0*    CBG:  Recent Labs Lab 09/09/14 1621 09/09/14 2114 09/10/14 0715 09/10/14 1130 09/10/14 1629  GLUCAP 163* 311* 106* 169* 102*    Recent Results (from the past 240 hour(s))  MRSA PCR Screening     Status: None   Collection Time: 09/02/14 11:20 AM  Result Value Ref Range Status   MRSA by PCR NEGATIVE NEGATIVE Final    Comment:        The GeneXpert MRSA Assay (FDA approved for NASAL specimens only), is one component of  a comprehensive MRSA colonization surveillance program. It is not intended to diagnose MRSA infection nor to guide or monitor treatment for MRSA infections.   Body fluid culture     Status: None   Collection Time: 09/02/14  3:30 PM  Result Value Ref Range Status   Specimen Description FLUID  Final   Special Requests PLEURAL RIGHT  Final   Gram Stain   Final    NO WBC SEEN NO ORGANISMS SEEN Performed at [facility]    Culture   Final    NO GROWTH 3 DAYS Performed at [facility]    Report Status 09/06/2014 FINAL  Final  Culture, blood (routine x 2) Call MD if unable to obtain prior to antibiotics being given     Status: None (Preliminary result)   Collection Time: 09/07/14  3:08 PM  Result Value Ref Range  Status   Specimen Description BLOOD BLOOD LEFT FOREARM  Final   Special Requests BOTTLES DRAWN AEROBIC ONLY 6CC  Final   Culture   Final           BLOOD CULTURE RECEIVED NO GROWTH TO DATE CULTURE WILL BE HELD FOR 5 DAYS BEFORE ISSUING A FINAL NEGATIVE REPORT Performed at [facility]    Report Status PENDING  Incomplete  Culture, blood (routine x 2) Call MD if unable to obtain prior to antibiotics being given     Status: None (Preliminary result)   Collection Time: 09/07/14  3:23 PM  Result Value Ref Range Status   Specimen Description BLOOD LEFT HAND  Final   Special Requests BOTTLES DRAWN AEROBIC ONLY 8CC  Final   Culture   Final           BLOOD CULTURE RECEIVED NO GROWTH TO DATE CULTURE WILL BE HELD FOR 5 DAYS BEFORE ISSUING A FINAL NEGATIVE REPORT Performed at [facility]    Report Status PENDING  Incomplete     Studies: No results found.  Scheduled Meds: . allopurinol  100 mg Oral BID  . aspirin EC  81 mg Oral Daily  . carvedilol  6.25 mg Oral BID WC  . ceFEPime (MAXIPIME) IV  1 g Intravenous Q24H  . heparin  5,000 Units Subcutaneous 3 times per day  . hydrALAZINE  25 mg Oral 3 times per day  . insulin aspart  0-9 Units  Subcutaneous TID WC  . ipratropium-albuterol  3 mL Nebulization BID  . isosorbide mononitrate  30 mg Oral Daily  . latanoprost  1 drop Both Eyes QHS  . polyethylene glycol  17 g Oral Daily  . potassium chloride  20 mEq Oral BID  . sodium chloride  3 mL Intravenous Q12H  . torsemide  20 mg Oral QPC lunch  . torsemide  40 mg Oral Daily  . vancomycin  1,000 mg Intravenous Q48H   Continuous Infusions:     Demontray Franta, MD, FACP, FHM. [facility] Pager [phone]  If 7PM-7AM, please contact night-coverage [email or web address] Password [email or web address] 09/10/2014, 4:52 PM    LOS: 13 days

## 2014-09-11 ENCOUNTER — Inpatient Hospital Stay (HOSPITAL_COMMUNITY): Payer: Medicare Other

## 2014-09-11 DIAGNOSIS — J189 Pneumonia, unspecified organism: Secondary | ICD-10-CM

## 2014-09-11 DIAGNOSIS — N179 Acute kidney failure, unspecified: Secondary | ICD-10-CM | POA: Insufficient documentation

## 2014-09-11 DIAGNOSIS — N189 Chronic kidney disease, unspecified: Secondary | ICD-10-CM

## 2014-09-11 LAB — BASIC METABOLIC PANEL
Anion gap: 13 (ref 5–15)
BUN: 51 mg/dL — ABNORMAL HIGH (ref 6–20)
CO2: 31 mmol/L (ref 22–32)
Calcium: 9.6 mg/dL (ref 8.9–10.3)
Chloride: 97 mmol/L — ABNORMAL LOW (ref 101–111)
Creatinine, Ser: 2.68 mg/dL — ABNORMAL HIGH (ref 0.44–1.00)
GFR calc Af Amer: 19 mL/min — ABNORMAL LOW (ref >60)
GFR calc non Af Amer: 16 mL/min — ABNORMAL LOW (ref >60)
Glucose, Bld: 86 mg/dL (ref 65–99)
Potassium: 4.7 mmol/L (ref 3.5–5.1)
Sodium: 141 mmol/L (ref 135–145)

## 2014-09-11 LAB — CBC
HCT: 27.3 % — ABNORMAL LOW (ref 36.0–46.0)
Hemoglobin: 8.5 g/dL — ABNORMAL LOW (ref 12.0–15.0)
MCH: 28.8 pg (ref 26.0–34.0)
MCHC: 31.1 g/dL (ref 30.0–36.0)
MCV: 92.5 fL (ref 78.0–100.0)
Platelets: 256 10*3/uL (ref 150–400)
RBC: 2.95 MIL/uL — ABNORMAL LOW (ref 3.87–5.11)
RDW: 16.1 % — ABNORMAL HIGH (ref 11.5–15.5)
WBC: 7 10*3/uL (ref 4.0–10.5)

## 2014-09-11 LAB — RETICULOCYTES
RBC.: 2.95 MIL/uL — ABNORMAL LOW (ref 3.87–5.11)
Retic Count, Absolute: 118 10*3/uL (ref 19.0–186.0)
Retic Ct Pct: 4 % — ABNORMAL HIGH (ref 0.4–3.1)

## 2014-09-11 LAB — IRON AND TIBC
Iron: 52 ug/dL (ref 28–170)
Saturation Ratios: 14 % (ref 10.4–31.8)
TIBC: 375 ug/dL (ref 250–450)
UIBC: 323 ug/dL

## 2014-09-11 LAB — FERRITIN: Ferritin: 91 ng/mL (ref 11–307)

## 2014-09-11 LAB — GLUCOSE, CAPILLARY
Glucose-Capillary: 105 mg/dL — ABNORMAL HIGH (ref 65–99)
Glucose-Capillary: 196 mg/dL — ABNORMAL HIGH (ref 65–99)

## 2014-09-11 LAB — FOLATE: Folate: 18.3 ng/mL (ref >5.9)

## 2014-09-11 LAB — BRAIN NATRIURETIC PEPTIDE: B Natriuretic Peptide: 276.8 pg/mL — ABNORMAL HIGH (ref 0.0–100.0)

## 2014-09-11 LAB — VITAMIN B12: Vitamin B-12: 545 pg/mL (ref 180–914)

## 2014-09-11 MED ORDER — POTASSIUM CHLORIDE CRYS ER 20 MEQ PO TBCR: 20.0000 meq | EXTENDED_RELEASE_TABLET | Freq: Every day | ORAL | Status: DC

## 2014-09-11 MED ORDER — HYDRALAZINE HCL 25 MG PO TABS: 25.0000 mg | ORAL_TABLET | Freq: Three times a day (TID) | ORAL | Status: DC

## 2014-09-11 MED ORDER — CARVEDILOL 6.25 MG PO TABS: 6.2500 mg | ORAL_TABLET | Freq: Two times a day (BID) | ORAL | Status: DC

## 2014-09-11 MED ORDER — GLIMEPIRIDE 1 MG PO TABS: 0.5000 mg | ORAL_TABLET | Freq: Every day | ORAL | Status: DC

## 2014-09-11 MED ORDER — CEFUROXIME AXETIL 250 MG PO TABS: 250.0000 mg | ORAL_TABLET | Freq: Two times a day (BID) | ORAL | Status: DC

## 2014-09-11 MED ORDER — TORSEMIDE 20 MG PO TABS: 20.0000 mg | ORAL_TABLET | Freq: Two times a day (BID) | ORAL | Status: DC

## 2014-09-11 NOTE — Discharge Summary (Signed)
Physician Discharge Summary  Doris Howell [id number] DOB: [birth year] DOA: 08/28/2014  PCP: Irven Shelling, MD  Admit date: 08/28/2014 Discharge date: 09/11/2014  Time spent: Greater than 30 minutes  Recommendations for Outpatient Follow-up:  1. Dr. [name], Cardiology on 10/01/14 at 11:40 AM 2. [facility] on 10/28/14 at 8 PM for sleep study 3. Dr. Lavone Orn, PCP in 5 days with repeat labs (CBC & BMP). Please follow final blood culture results that were sent from the hospital. 4. Home health PT, OT and RN 5. Nightly CPAP with auto titrate settings per home health 6. Recommend repeating chest x-ray in 4-6 weeks. 7. Recommend outpatient Nephrology consultation  Discharge Diagnoses:  Principal Problem:   Acute on chronic combined systolic and diastolic CHF (congestive heart failure) Active Problems:   Type 2 diabetes mellitus with stage 4 chronic kidney disease   Bradycardia- beta blocker decreased   Sleep apnea   Chronic kidney disease (CKD), stage IV (severe)   Acute on chronic renal failure   Acute respiratory failure, unspecified whether with hypoxia or hypercapnia   Lung infiltrate   Pleural effusion on right   Discharge Condition: Improved & Stable  Diet recommendation: Heart healthy and diabetic diet  Filed Weights   09/09/14 0510 09/10/14 0604 09/11/14 0545  Weight: 86.4 kg (190 lb 7.6 oz) 82.6 kg (182 lb 1.6 oz) 82 kg (180 lb 12.4 oz)    History of present illness:  79 year old female patient with history of chronic diastolic CHF, multiple hospitalizations for decompensated CHF, HTN, HLD, DM 2 with stage IV chronic kidney disease, GERD, gout was admitted to [facility] on 08/28/14 with dyspnea of several days' duration, orthopnea, dyspnea on minimal exertion. Her weight had increased from dry weight of 181-184. Her PCP increased her Lasix dose from 60-80 mg daily without much improvement. In the ED, POC troponin negative, BNP 646, chest  x-ray with recurrent moderate CHF and EKG showed sinus rhythm with nonspecific intraventricular conduction block similar to prior EKGs. [place] 04/15/14 and she was found to have a pulmonary artery pressure of 64/25 with a mean of 40. The right ventricular end-diastolic pressure was 15. The impression was biventricular heart failure with preserved left ventricular ejection fraction and moderate pulmonary artery hypertension.  Hospital Course:   Acute on chronic combined systolic and diastolic CHF - EF [id number] percent normal RV. Lexi scan January 2016: EF 44% no evidence of ischemia. - Cardiology/ Advanced Heart Failure teamwas consulted - Diuresed with IV Lasix and transitioned to torsemide (was on Lasix 40 MG twice a day PTA). - Diuresed 14 pounds. - Continue torsemide, hydralazine/Imdur - No ACEI/Aldactone due to chronic kidney disease. - Creatinine up above baseline of 2.2 > 2.6 > 2.6 - According to Cardiology, Cardiomems is a consideration however GFR too low to pursue.  Acute respiratory failure with hypoxia - Secondary to decompensated CHF, COPD and pneumonia - Overnight 5/23, patient became acutely hypoxic and hypercapnic. She was transferred to stepdown and placed on BiPAP. Repeat chest x-ray showed right greater than left mid and upper lung pulmonary consolidation which may represent multifocal pneumonia, hemorrhage, infarct or edema. - Patient underwent ultrasound-guided right thoracentesis yielding 400 mL . This was done by IR on 5/24 - Treated underlying cause and supportively with oxygen. - Hypoxia resolved and patient saturating >90% on room air both at rest and with activity on day of discharge.  Healthcare associated pneumonia - concern for pneumonia on 5/29 and started empirically on antibiotics - Treated  empirically with IV vancomycin and cefepime. She was then transitioned to oral Ceftin to complete total 8 days course of antibiotics. - SLP evaluated and recommended Regular diet  with thin liquids - Not entirely clear if all her presentation was primarily from decompensated CHF rather than pneumonia  COPD - Stable - Life long non smoker but life long second hand smoking. - Was not on home oxygen PTA  OSA/OHS - Patient was not compliant with her CPAP at home-stated financial difficulties - Case management consulted to assist with CPAP for home. Sleep study appointment was also made.  Acute on stage IV chronic kidney disease - Renal ultrasound revealed chronic medical disease. - Sliding creatinine: in the low 2's. No creatinine slightly worsened secondary to diuresis. - Close outpatient follow-up and consider nephrology consultation - Received Aranesp 5/23  Type II DM with stage IV chronic kidney disease - A1c: 6.6 - Reduced her Amaryl to very small dose of 0.5 MG daily. Cannot use metformin due to renal insufficiency.  Essential hypertension - Controlled on Coreg, Imdur, hydralazine and torsemide  Gout - Stable - Continue allopurinol  Anemia of chronic disease/chronic kidney disease - Stable. Close outpatient follow-up.  Status post right thoracentesis - Improved and culture negative  Bradycardia - Carvedilol dose was reduced to 6.25 twice a day.  Consultants:  Cardiology  Interventional radiology  Procedures:  Right thoracentesis   Discharge Exam:  Complaints:  Denies dyspnea or chest pain. Denies complaints.  Filed Vitals:   09/10/14 1654 09/10/14 2046 09/10/14 2056 09/11/14 0545  BP: 114/50  131/54 121/49  Pulse: 69 71 72 74  Temp:   98.8 F (37.1 C) 98.9 F (37.2 C)  TempSrc:   Oral Oral  Resp:  16 18 18   Height:      Weight:    82 kg (180 lb 12.4 oz)  SpO2:  98% 96% 97%    General: Alert, awake, oriented x3, in no acute distress.  HEENT: No bruits, no goiter.  Heart: Regular rate and rhythm. Telemetry: Sinus rhythm with BBB morphology. Lungs: Diminished breath sounds in the bases but otherwise clear to auscultation.  No increased work of breathing. Abdomen: Soft, nontender, nondistended, positive bowel sounds.  Neuro: Grossly intact, nonfocal.  Discharge Instructions      Discharge Instructions    (HEART FAILURE PATIENTS) Call MD:  Anytime you have any of the following symptoms: 1) 3 pound weight gain in 24 hours or 5 pounds in 1 week 2) shortness of breath, with or without a dry hacking cough 3) swelling in the hands, feet or stomach 4) if you have to sleep on extra pillows at night in order to breathe.    Complete by:  As directed      Call MD for:  difficulty breathing, headache or visual disturbances    Complete by:  As directed      Call MD for:  extreme fatigue    Complete by:  As directed      Call MD for:  persistant dizziness or light-headedness    Complete by:  As directed      Call MD for:  persistant nausea and vomiting    Complete by:  As directed      Call MD for:  severe uncontrolled pain    Complete by:  As directed      Call MD for:  temperature >100.4    Complete by:  As directed      Diet - low sodium heart healthy  Complete by:  As directed      Diet Carb Modified    Complete by:  As directed      Discharge instructions    Complete by:  As directed   Use CPAP at bedtime.     Increase activity slowly    Complete by:  As directed             Medication List    STOP taking these medications        amLODipine 10 MG tablet  Commonly known as:  NORVASC     furosemide 40 MG tablet  Commonly known as:  LASIX     ondansetron 4 MG disintegrating tablet  Commonly known as:  ZOFRAN ODT      TAKE these medications        albuterol 108 (90 BASE) MCG/ACT inhaler  Commonly known as:  PROVENTIL HFA;VENTOLIN HFA  Inhale 2 puffs into the lungs every 6 (six) hours as needed for wheezing or shortness of breath.     allopurinol 100 MG tablet  Commonly known as:  ZYLOPRIM  Take 100 mg by mouth 2 (two) times daily.     aspirin EC 81 MG tablet  Take 81 mg by mouth daily.      bimatoprost 0.01 % Soln  Commonly known as:  LUMIGAN  Place 1 drop into both eyes at bedtime.     carvedilol 6.25 MG tablet  Commonly known as:  COREG  Take 1 tablet (6.25 mg total) by mouth 2 (two) times daily with a meal.     cefUROXime 250 MG tablet  Commonly known as:  CEFTIN  Take 1 tablet (250 mg total) by mouth 2 (two) times daily with a meal.     cholecalciferol 1000 UNITS tablet  Commonly known as:  VITAMIN D  Take 2,000 Units by mouth daily.     Ferrous Sulfate Dried 200 (65 FE) MG Tabs  Take 1 tablet by mouth daily.     glimepiride 1 MG tablet  Commonly known as:  AMARYL  Take 0.5 tablets (0.5 mg total) by mouth daily.     hydrALAZINE 25 MG tablet  Commonly known as:  APRESOLINE  Take 1 tablet (25 mg total) by mouth 3 (three) times daily.     isosorbide mononitrate 30 MG 24 hr tablet  Commonly known as:  IMDUR  Take 1 tablet (30 mg total) by mouth daily.     potassium chloride SA 20 MEQ tablet  Commonly known as:  K-DUR,KLOR-CON  Take 1 tablet (20 mEq total) by mouth daily.     torsemide 20 MG tablet  Commonly known as:  DEMADEX  Take 1-2 tablets (20-40 mg total) by mouth 2 (two) times daily. Take 2 tabs daily at 8 AM and 1 tab daily after lunch at 1 PM.       Follow-up Information    Follow up with [name], MD On 10/01/2014.   Specialty:  Cardiology   Why:  at 11:40 [place] information:   [address] [place] [address] [phone]       Follow up with [facility] On 10/28/2014.   Why:  @ 8:00 Pm for Sleep Study   Contact information:   [address] [place] [phone]      Follow up with Irven Shelling, MD. Schedule an appointment as soon as possible for a visit in 16  days.   Specialty:  Internal Medicine   Why:  To be seen with repeat labs (CBC & BMP).   Contact information:   [address] [place]  [address] [phone]        The results of significant diagnostics from this hospitalization (including imaging, microbiology, ancillary and laboratory) are listed below for reference.    Significant Diagnostic Studies: Dg Chest 1 View  09/02/2014   CLINICAL DATA:  Right pleural effusion status post thoracentesis.  EXAM: CHEST  1 VIEW  COMPARISON:  Chest radiograph 09/02/2014  FINDINGS: Monitoring leads overlie the patient. Stable cardiomegaly. Grossly unchanged right-greater-than-left mid and upper lung pulmonary consolidative opacities. Persistent small layering left pleural effusion with underlying pulmonary consolidation suggestive of associated atelectasis. Bilateral interstitial pulmonary opacities. Interval decrease in size of right pleural effusion. No definite pneumothorax.  IMPRESSION: Persistent right-greater-than-left mid and upper lung airspace opacities potentially secondary to multi focal pneumonia.  Slight interval improvement interstitial pulmonary edema. Cardiomegaly.  Persistent small left layering pleural effusion.  Interval decrease in size right pleural effusion.   Electronically Signed   By: Lovey Newcomer M.D.   On: 09/02/2014 15:44   Dg Chest 2 View  09/11/2014   CLINICAL DATA:  CHF, diabetes, acute on chronic renal failure  EXAM: CHEST  2 VIEW  COMPARISON:  Portable chest x-ray of Sep 07, 2014  FINDINGS: The lungs are borderline hypoinflated. The interstitial markings remain increased bilaterally. There is a small amount of pleural fluid blunting the right lateral costophrenic angle and lying along the lateral pleural space on the right. The cardiac silhouette remains enlarged. The pulmonary vascularity is engorged and indistinct. The mediastinum is normal in width. The trachea is midline.  IMPRESSION: CHF with pulmonary interstitial edema and small right pleural effusion. Allowing for today's mild hypoinflation, there has not been significant interval change since the previous  study.   Electronically Signed   By: David  Martinique M.D.   On: 09/11/2014 07:53   Dg Chest 2 View  09/04/2014   CLINICAL DATA:  Persistent hypoxia.  EXAM: CHEST  2 VIEW  COMPARISON:  09/02/2014  FINDINGS: The heart is enlarged but stable. Stable tortuosity and calcification of the thoracic aorta. Persistent interstitial and airspace process with probable focal bilateral infiltrates. Slight improved left upper lobe lung aeration. Suspect enlarging pleural effusions and worsening right lung base aeration.  IMPRESSION: Persistent interstitial and airspace process with bilateral pleural effusions and worsening right lung base aeration.   Electronically Signed   By: Marijo Sanes M.D.   On: 09/04/2014 12:33   Dg Chest 2 View  09/01/2014   CLINICAL DATA:  Shortness of breath, congestive heart failure  EXAM: CHEST  2 VIEW  COMPARISON:  08/18/2014  FINDINGS: Slight interval improvement in aeration with moderate to marked cardiomegaly and central vascular congestion reidentified. Increased moderate size pleural effusions are noted. Patchy perihilar airspace opacities are noted.  IMPRESSION: Improved aeration however increased moderate pleural effusions and patchy perihilar airspace opacities are identified likely indicating worsening edema. Other alveolar filling processes could appear similar.   Electronically Signed   By: Conchita Paris M.D.   On: 09/01/2014 13:02   Ct Chest Wo Contrast  09/04/2014   CLINICAL DATA:  Shortness of Breath/hypoxia  EXAM: CT CHEST WITHOUT CONTRAST  TECHNIQUE: Multidetector CT imaging of the chest was performed following the standard protocol without IV contrast.  COMPARISON:  Chest radiograph Sep 04, 2014  FINDINGS: There are moderate free-flowing pleural effusions bilaterally.  There is extensive upper lobe airspace consolidation bilaterally, somewhat more on the right than on the left. There is bibasilar atelectasis with consolidation in the left base posteriorly.  There is  cardiomegaly.  Pericardium is not thickened.  There are multiple small mediastinal lymph nodes but no adenopathy by size criteria. Calcification in the sub- carinal region is consistent with prior granulomatous disease.  There is atherosclerotic change in the aorta but no aneurysm.  Thyroid appears unremarkable.  In the visualized upper abdomen, there is atherosclerotic change in aorta. There is incomplete visualization of an apparent left adrenal adenoma measuring 1.2 x 1.0 cm.  There is degenerative change in the thoracic spine. There are no blastic or lytic bone lesions.  IMPRESSION: There is cardiomegaly with bilateral effusions. There may be a degree of congestive heart failure.  There is airspace consolidation in both upper lobes, extensive. While this appearance may be due to alveolar edema, the appearance of these areas of airspace consolidation is more suspicious for pneumonia. There is also focal presumed pneumonia in the left base posteriorly. Note that edema and pneumonia may coexist.  No appreciable adenopathy. Areas of atherosclerotic calcification. Probable small left adrenal adenoma.   Electronically Signed   By: Lowella Grip III M.D.   On: 09/04/2014 11:24   US Renal  08/31/2014   CLINICAL DATA:  Patient with acute renal failure. Prior left nephrectomy.  EXAM: RENAL / URINARY TRACT ULTRASOUND COMPLETE  COMPARISON:  CT 04/05/2014  FINDINGS: Right Kidney:  Length: 11.5 cm. Diffusely increased renal parenchymal echogenicity. No hydronephrosis.  Left Kidney:  Surgically absent.  Bladder:  Appears normal for degree of bladder distention.  IMPRESSION: Increased renal parenchymal echogenicity suggestive of chronic medical renal disease. No hydronephrosis.   Electronically Signed   By: Lovey Newcomer M.D.   On: 08/31/2014 21:41   Dg Chest Port 1 View  09/07/2014   CLINICAL DATA:  Shortness of breath and wheezing  EXAM: PORTABLE CHEST - 1 VIEW  COMPARISON:  Chest CT 09/04/2014 and chest radiograph  09/04/2014  FINDINGS: Multi lobar consolidation has improved since previously. Trace pleural effusions are smaller. Moderate enlargement of cardiomediastinal silhouette persists with central vascular congestion.  IMPRESSION: Improved multi lobar airspace opacities which could represent improving asymmetric edema although pneumonia could appear similar. Followup PA and lateral chest X-ray is recommended in 3-4 weeks following trial of antibiotic therapy to ensure resolution and exclude underlying malignancy.   Electronically Signed   By: Conchita Paris M.D.   On: 09/07/2014 16:12   Dg Chest Port 1 View  09/02/2014   CLINICAL DATA:  Patient with shortness of breath.  EXAM: PORTABLE CHEST - 1 VIEW  COMPARISON:  Chest radiograph 09/01/2014  FINDINGS: Monitoring leads overlie the patient. Stable cardiomegaly. Interval development of large right greater than left mid and upper lung consolidative pulmonary opacities. Small to moderate layering bilateral pleural effusions with underlying opacities suggestive of atelectasis. Bilateral predominately perihilar interstitial pulmonary opacities.  IMPRESSION: Interval development right-greater-than-left mid and upper lung pulmonary consolidation which may represent multi focal pneumonia, hemorrhage, infarct or more focal areas of consolidation due to edema.  Cardiomegaly with interstitial edema.  Layering bilateral pleural effusions with underlying atelectasis.   Electronically Signed   By: Lovey Newcomer M.D.   On: 09/02/2014 09:05   Dg Chest Port 1 View  08/28/2014   CLINICAL DATA:  Congestive heart failure.  Shortness of breath.  EXAM: PORTABLE CHEST - 1 VIEW  COMPARISON:  07/29/2014.  FINDINGS: Cardiopericardial silhouette is  partially obscured by effusions, edema and atelectasis. Cardiomegaly remains present. Aortic arch atherosclerosis. Interstitial and airspace opacity with basilar predominance compatible with pulmonary edema. Bilateral pleural effusions.  IMPRESSION:  Recurrent moderate CHF.   Electronically Signed   By: Dereck Ligas M.D.   On: 08/28/2014 13:02   Dg Abd 2 Views  09/02/2014   CLINICAL DATA:  Patient with abdominal distension.  EXAM: ABDOMEN - 2 VIEW  COMPARISON:  CT 04/05/2014  FINDINGS: Gas is demonstrated within nondilated loops of large and small bowel. No evidence for bowel obstruction. Decubitus images demonstrate no free intraperitoneal air. Surgical clips left mid abdomen. Cardiomegaly. Bibasilar heterogeneous opacities. Calcifications in left renal fossa.  IMPRESSION: Nonobstructed bowel gas pattern.  Cardiomegaly.  Basilar heterogeneous opacities compatible with known pleural effusions possible atelectasis.   Electronically Signed   By: Lovey Newcomer M.D.   On: 09/02/2014 09:50   US Thoracentesis Asp Pleural Space W/img Guide  09/02/2014   CLINICAL DATA:  Pleural Effusion  EXAM: ULTRASOUND GUIDED Right THORACENTESIS  COMPARISON:  None.  PROCEDURE: An ultrasound guided thoracentesis was thoroughly discussed with the patient and questions answered. The benefits, risks, alternatives and complications were also discussed. The patient understands and wishes to proceed with the procedure. Written consent was obtained.  Ultrasound was performed to localize and mark an adequate pocket of fluid in the right chest. The area was then prepped and draped in the normal sterile fashion. 1% Lidocaine was used for local anesthesia. Under ultrasound guidance a 19 gauge Safe T centesis catheter was introduced. Thoracentesis was performed. The catheter was removed and a dressing applied.  COMPLICATIONS: None.  FINDINGS: A total of approximately 400 mls of Clear yellow fluid was removed. A fluid sample wassent for laboratory analysis.  IMPRESSION: Successful ultrasound guided Right thoracentesis yielding 400 mls of pleural fluid.  Read By:  Gareth Eagle PA-C   Electronically Signed   By: Jacqulynn Cadet M.D.   On: 09/02/2014 15:32    Microbiology: Recent Results  (from the past 240 hour(s))  MRSA PCR Screening     Status: None   Collection Time: 09/02/14 11:20 AM  Result Value Ref Range Status   MRSA by PCR NEGATIVE NEGATIVE Final    Comment:        The GeneXpert MRSA Assay (FDA approved for NASAL specimens only), is one component of a comprehensive MRSA colonization surveillance program. It is not intended to diagnose MRSA infection nor to guide or monitor treatment for MRSA infections.   Body fluid culture     Status: None   Collection Time: 09/02/14  3:30 PM  Result Value Ref Range Status   Specimen Description FLUID  Final   Special Requests PLEURAL RIGHT  Final   Gram Stain   Final    NO WBC SEEN NO ORGANISMS SEEN Performed at [facility]    Culture   Final    NO GROWTH 3 DAYS Performed at [facility]    Report Status 09/06/2014 FINAL  Final  Culture, blood (routine x 2) Call MD if unable to obtain prior to antibiotics being given     Status: None (Preliminary result)   Collection Time: 09/07/14  3:08 PM  Result Value Ref Range Status   Specimen Description BLOOD BLOOD LEFT FOREARM  Final   Special Requests BOTTLES DRAWN AEROBIC ONLY 6CC  Final   Culture   Final           BLOOD CULTURE RECEIVED NO GROWTH TO DATE CULTURE  WILL BE HELD FOR 5 DAYS BEFORE ISSUING A FINAL NEGATIVE REPORT Performed at [facility]    Report Status PENDING  Incomplete  Culture, blood (routine x 2) Call MD if unable to obtain prior to antibiotics being given     Status: None (Preliminary result)   Collection Time: 09/07/14  3:23 PM  Result Value Ref Range Status   Specimen Description BLOOD LEFT HAND  Final   Special Requests BOTTLES DRAWN AEROBIC ONLY 8CC  Final   Culture   Final           BLOOD CULTURE RECEIVED NO GROWTH TO DATE CULTURE WILL BE HELD FOR 5 DAYS BEFORE ISSUING A FINAL NEGATIVE REPORT Performed at [facility]    Report Status PENDING  Incomplete     Labs: Basic Metabolic Panel:  Recent  Labs Lab 09/07/14 0405 09/08/14 0705 09/09/14 1010 09/10/14 1511 09/11/14 0342  NA 141 141 141 139 141  K 3.7 3.9 4.4 5.0 4.7  CL 97* 98* 94* 95* 97*  CO2 35* 33* 34* 32 31  GLUCOSE 100* 108* 247* 192* 86  BUN 61* 56* 54* 52* 51*  CREATININE 2.66* 2.54* 2.64* 2.74* 2.68*  CALCIUM 9.4 9.4 9.7 9.6 9.6   Liver Function Tests: No results for input(s): AST, ALT, ALKPHOS, BILITOT, PROT, ALBUMIN in the last 168 hours. No results for input(s): LIPASE, AMYLASE in the last 168 hours. No results for input(s): AMMONIA in the last 168 hours. CBC:  Recent Labs Lab 09/08/14 0705 09/11/14 0342  WBC 6.6 7.0  HGB 8.1* 8.5*  HCT 26.5* 27.3*  MCV 92.0 92.5  PLT 262 256   Cardiac Enzymes: No results for input(s): CKTOTAL, CKMB, CKMBINDEX, TROPONINI in the last 168 hours. BNP: BNP (last 3 results)  Recent Labs  08/28/14 1204 09/06/14 1230 09/11/14 0356  BNP 646.5* 742.7* 276.8*    ProBNP (last 3 results)  Recent Labs  01/09/14 1515 02/05/14 1216  PROBNP 2595.0* 2767.0*    CBG:  Recent Labs Lab 09/10/14 1130 09/10/14 1629 09/10/14 2048 09/11/14 0722 09/11/14 1112  GLUCAP 169* 102* 263* 105* 196*     Additional labs: 1. ABG 09/02/14: PH 7.340, PCO2 60.5, PO2 59.7, bicarbonate 31.7 and oxygen saturation 90.3% on 5 L/m oxygen via nasal cannula. 2. Anemia panel: Iron 52, TIBC 375, saturation ratios 14, ferritin 91, folate 18.3 and B12: 545. Reticulocytes 118 3. A1c: 6.6 4. HIV antibody screen: Nonreactive 5. Urine streptococcal antigen: Negative 6. Pleural fluid: Glucose 144, protein <3, LDH: 47, WBCs 197, lymphocytes 5%, monocytes 90%, neutrophils 5%. No cytology seems to have been sent.  Discussed extensively with patient's daughter at bedside.  Signed:  Vernell Leep, MD, FACP, FHM. [facility] Pager [phone]  If 7PM-7AM, please contact night-coverage [email or web address] Password [facility] 09/11/2014, 12:31 PM

## 2014-09-11 NOTE — Progress Notes (Signed)
SATURATION QUALIFICATIONS: (This note is used to comply with regulatory documentation for home oxygen)  Patient Saturations on Room Air at Rest = 95%  Patient Saturations on Room Air while Ambulating = 92%   

## 2014-09-11 NOTE — Progress Notes (Signed)
Pt discharged to home via W/C, condition stable, accompanied by daughter and son.

## 2014-09-11 NOTE — Progress Notes (Signed)
Patient Name: Doris Howell Date of Encounter: 09/11/2014  Primary Cardiologist: Dr. Mare Ferrari   Principal Problem:   Acute on chronic combined systolic and diastolic CHF (congestive heart failure) Active Problems:   Type 2 diabetes mellitus with stage 4 chronic kidney disease   Bradycardia- beta blocker decreased   Sleep apnea   Chronic kidney disease (CKD), stage IV (severe)   Acute on chronic renal failure   Acute respiratory failure, unspecified whether with hypoxia or hypercapnia   Lung infiltrate   Pleural effusion on right    SUBJECTIVE  Denies SOB/Orthopnea.  Weight down another 2 pounds. I/O not accurate.   Creatinine 2.6  CURRENT MEDS . allopurinol  100 mg Oral BID  . aspirin EC  81 mg Oral Daily  . carvedilol  6.25 mg Oral BID WC  . cefUROXime  250 mg Oral BID WC  . heparin  5,000 Units Subcutaneous 3 times per day  . hydrALAZINE  25 mg Oral 3 times per day  . insulin aspart  0-9 Units Subcutaneous TID WC  . ipratropium-albuterol  3 mL Nebulization BID  . isosorbide mononitrate  30 mg Oral Daily  . latanoprost  1 drop Both Eyes QHS  . polyethylene glycol  17 g Oral Daily  . potassium chloride  20 mEq Oral Daily  . sodium chloride  3 mL Intravenous Q12H  . torsemide  20 mg Oral QPC lunch  . torsemide  40 mg Oral Daily    OBJECTIVE  Filed Vitals:   09/10/14 1654 09/10/14 2046 09/10/14 2056 09/11/14 0545  BP: 114/50  131/54 121/49  Pulse: 69 71 72 74  Temp:   98.8 F (37.1 C) 98.9 F (37.2 C)  TempSrc:   Oral Oral  Resp:  16 18 18   Height:      Weight:    180 lb 12.4 oz (82 kg)  SpO2:  98% 96% 97%    Intake/Output Summary (Last 24 hours) at 09/11/14 0835 Last data filed at 09/11/14 0825  Gross per 24 hour  Intake     50 ml  Output      0 ml  Net     50 ml   Filed Weights   09/09/14 0510 09/10/14 0604 09/11/14 0545  Weight: 190 lb 7.6 oz (86.4 kg) 182 lb 1.6 oz (82.6 kg) 180 lb 12.4 oz (82 kg)    PHYSICAL EXAM  General: Pleasant,  NAD.In bed  Neuro: Alert and oriented X 3. Moves all extremities spontaneously. Psych: Normal affect. HEENT:  Normal  Neck: Supple without bruits.  JVP 8 cm.  Lungs:  Decreased in the bases on 2 liters . Heart: RRR no s3, s4, or murmurs. Abdomen: Soft, non-tender, non-distended, BS + x 4.  Extremities: No clubbing, cyanosis or edema. DP/PT/Radials 2+ and equal bilaterally.  Accessory Clinical Findings   CBC  Recent Labs  09/11/14 0342  WBC 7.0  HGB 8.5*  HCT 27.3*  MCV 92.5  PLT 123456   Basic Metabolic Panel  Recent Labs  09/10/14 1511 09/11/14 0342  NA 139 141  K 5.0 4.7  CL 95* 97*  CO2 32 31  GLUCOSE 192* 86  BUN 52* 51*  CREATININE 2.74* 2.68*  CALCIUM 9.6 9.6    TELE NSR with HR 70s, frequent PVCs    ECG  No new EKG  Echocardiogram 07/31/2014  LV EF: 45% -  50%  ------------------------------------------------------------------- Indications:   Dyspnea 786.09.  ------------------------------------------------------------------- History:  PMH: Bradycardia. Obesity. Chronic kidney disease.  Sleep apnea. Congestive heart failure. Risk factors: Hypertension. Diabetes mellitus.  ------------------------------------------------------------------- Study Conclusions  - Left ventricle: The cavity size was normal. There was moderate concentric hypertrophy. Systolic function was mildly reduced. The estimated ejection fraction was in the range of 45% to 50%. Diffuse hypokinesis. Features are consistent with a pseudonormal left ventricular filling pattern, with concomitant abnormal relaxation and increased filling pressure (grade 2 diastolic dysfunction). Doppler parameters are consistent with elevated ventricular end-diastolic filling pressure. - Mitral valve: There was mild regurgitation. - Left atrium: The atrium was mildly dilated. - Right ventricle: The cavity size was normal. Wall thickness was normal. Systolic function was  normal. - Right atrium: The atrium was normal in size. - Tricuspid valve: There was no regurgitation. - Pulmonary arteries: Systolic pressure was within the normal range. - Inferior vena cava: The vessel was normal in size. - Pericardium, extracardiac: A trivial pericardial effusion was identified posterior to the heart. Features were not consistent with tamponade physiology.      ASSESSMENT AND PLAN 79 yo female with RHC in Jan concerning for biventricular failure present with multiple admission for HF.  1. Acute on chronic combined systolic and diastolic HF- Grade II DD  EF 45-50% Normal RV  Had lexiscan January 2016 --> EF 44% no evidence of ischemia Diuresed with IV lasix and transitioned to torsemide 40 mg/20 mg . Volume status stable. Continue current dose of torsemide. Diuresed 14 pounds. Continue current dose of hydralazine/imdur. No ace or spiro with CKD Prior to admit had been on lasix 40 mg bid. Creatinine up above baseline of 2.2> 2.6>2.6  Cardiomems is a consideration however GFR to low to pursue.  2. Acute on chronic resp failure - found to be unresponsive in AM of 5/24, noncompliant with CPAP. ? Says she doesn't have CPAP  3. Acute on chronic renal insufficiency, stage IV- Solitary kidney Creatinine baseline 2.2  4. Pleural effusion s/p R thoracentesis 5. Bradycardia- Beta blocker cut back on 5/25--> Carvedilol now at 6.25 mg twice a day. HR 50-70s.  6. DM 7. OSA- CPAP at night while hospitalized. She does not wear at home . Needs outpatient pulmonary follow up. Needs sleep study  8. HCAP- on antibiotics per primary team  9. Anemia- normal colonoscopy 07/2014. Last iron level January 2016. Check anemia panel. No source of bleeding.   Will need follow up in the HF clinic  CLEGG,AMY NP-C  Advanced Heart Failure Team Pager [phone] (M-F; 7a - 4p)  Please contact [facility] for night-coverage after hours (4p -7a ) and weekends on [email or web address]  Patient seen  with NP, agree with the above note.  Volume status stable on po torsemide.  Creatinine stable.  Possibly home today, will need BMET in 1 week and followup CHF clinic.   [name] 09/11/2014 9:06 AM

## 2014-09-11 NOTE — Discharge Instructions (Signed)
Heart Failure Heart failure means your heart has trouble pumping blood. This makes it hard for your body to work well. Heart failure is usually a long-term (chronic) condition. You must take good care of yourself and follow your doctor's treatment plan. HOME CARE  Take your heart medicine as told by your doctor.  Do not stop taking medicine unless your doctor tells you to.  Do not skip any dose of medicine.  Refill your medicines before they run out.  Take other medicines only as told by your doctor or pharmacist.  Stay active if told by your doctor. The elderly and people with severe heart failure should talk with a doctor about physical activity.  Eat heart-healthy foods. Choose foods that are without trans fat and are low in saturated fat, cholesterol, and salt (sodium). This includes fresh or frozen fruits and vegetables, fish, lean meats, fat-free or low-fat dairy foods, whole grains, and high-fiber foods. Lentils and dried peas and beans (legumes) are also good choices.  Limit salt if told by your doctor.  Cook in a healthy way. Roast, grill, broil, bake, poach, steam, or stir-fry foods.  Limit fluids as told by your doctor.  Weigh yourself every morning. Do this after you pee (urinate) and before you eat breakfast. Write down your weight to give to your doctor.  Take your blood pressure and write it down if your doctor tells you to.  Ask your doctor how to check your pulse. Check your pulse as told.  Lose weight if told by your doctor.  Stop smoking or chewing tobacco. Do not use gum or patches that help you quit without your doctor's approval.  Schedule and go to doctor visits as told.  Nonpregnant women should have no more than 1 drink a day. Men should have no more than 2 drinks a day. Talk to your doctor about drinking alcohol.  Stop illegal drug use.  Stay current with shots (immunizations).  Manage your health conditions as told by your doctor.  Learn to  manage your stress.  Rest when you are tired.  If it is really hot outside:  Avoid intense activities.  Use air conditioning or fans, or get in a cooler place.  Avoid caffeine and alcohol.  Wear loose-fitting, lightweight, and light-colored clothing.  If it is really cold outside:  Avoid intense activities.  Layer your clothing.  Wear mittens or gloves, a hat, and a scarf when going outside.  Avoid alcohol.  Learn about heart failure and get support as needed.  Get help to maintain or improve your quality of life and your ability to care for yourself as needed. GET HELP IF:   You gain 03 lb/1.4 kg or more in 1 day or 05 lb/2.3 kg in a week.  You are more short of breath than usual.  You cannot do your normal activities.  You tire easily.  You cough more than normal, especially with activity.  You have any or more puffiness (swelling) in areas such as your hands, feet, ankles, or belly (abdomen).  You cannot sleep because it is hard to breathe.  You feel like your heart is beating fast (palpitations).  You get dizzy or light-headed when you stand up. GET HELP RIGHT AWAY IF:   You have trouble breathing.  There is a change in mental status, such as becoming less alert or not being able to focus.  You have chest pain or discomfort.  You faint. MAKE SURE YOU:   Understand these instructions.  Will watch your condition.  Will get help right away if you are not doing well or get worse. Document Released: 01/05/2008 Document Revised: 08/12/2013 Document Reviewed: 05/14/2012 [facility] Patient Information 2015 [place], [place]. This information is not intended to replace advice given to you by your health care provider. Make sure you discuss any questions you have with your health care provider.  Pneumonia Pneumonia is an infection of the lungs.  CAUSES Pneumonia may be caused by bacteria or a virus. Usually, these infections are caused by breathing infectious  particles into the lungs (respiratory tract). SIGNS AND SYMPTOMS   Cough.  Fever.  Chest pain.  Increased rate of breathing.  Wheezing.  Mucus production. DIAGNOSIS  If you have the common symptoms of pneumonia, your health care provider will typically confirm the diagnosis with a chest X-ray. The X-ray will show an abnormality in the lung (pulmonary infiltrate) if you have pneumonia. Other tests of your blood, urine, or sputum may be done to find the specific cause of your pneumonia. Your health care provider may also do tests (blood gases or pulse oximetry) to see how well your lungs are working. TREATMENT  Some forms of pneumonia may be spread to other people when you cough or sneeze. You may be asked to wear a mask before and during your exam. Pneumonia that is caused by bacteria is treated with antibiotic medicine. Pneumonia that is caused by the influenza virus may be treated with an antiviral medicine. Most other viral infections must run their course. These infections will not respond to antibiotics.  HOME CARE INSTRUCTIONS   Cough suppressants may be used if you are losing too much rest. However, coughing protects you by clearing your lungs. You should avoid using cough suppressants if you can.  Your health care provider may have prescribed medicine if he or she thinks your pneumonia is caused by bacteria or influenza. Finish your medicine even if you start to feel better.  Your health care provider may also prescribe an expectorant. This loosens the mucus to be coughed up.  Take medicines only as directed by your health care provider.  Do not smoke. Smoking is a common cause of bronchitis and can contribute to pneumonia. If you are a smoker and continue to smoke, your cough may last several weeks after your pneumonia has cleared.  A cold steam vaporizer or humidifier in your room or home may help loosen mucus.  Coughing is often worse at night. Sleeping in a semi-upright  position in a recliner or using a couple pillows under your head will help with this.  Get rest as you feel it is needed. Your body will usually let you know when you need to rest. PREVENTION A pneumococcal shot (vaccine) is available to prevent a common bacterial cause of pneumonia. This is usually suggested for:  People over 70 years old.  Patients on chemotherapy.  People with chronic lung problems, such as bronchitis or emphysema.  People with immune system problems. If you are over 65 or have a high risk condition, you may receive the pneumococcal vaccine if you have not received it before. In some countries, a routine influenza vaccine is also recommended. This vaccine can help prevent some cases of pneumonia.You may be offered the influenza vaccine as part of your care. If you smoke, it is time to quit. You may receive instructions on how to stop smoking. Your health care provider can provide medicines and counseling to help you quit. SEEK MEDICAL CARE IF:  You have a fever. SEEK IMMEDIATE MEDICAL CARE IF:   Your illness becomes worse. This is especially true if you are elderly or weakened from any other disease.  You cannot control your cough with suppressants and are losing sleep.  You begin coughing up blood.  You develop pain which is getting worse or is uncontrolled with medicines.  Any of the symptoms which initially brought you in for treatment are getting worse rather than better.  You develop shortness of breath or chest pain. MAKE SURE YOU:   Understand these instructions.  Will watch your condition.  Will get help right away if you are not doing well or get worse. Document Released: 03/28/2005 Document Revised: 08/12/2013 Document Reviewed: 06/17/2010 [facility] Patient Information 2015 [place], [place]. This information is not intended to replace advice given to you by your health care provider. Make sure you discuss any questions you have with your health care  provider.

## 2014-09-13 LAB — CULTURE, BLOOD (ROUTINE X 2)
Culture: NO GROWTH
Culture: NO GROWTH

## 2014-10-01 ENCOUNTER — Ambulatory Visit (HOSPITAL_COMMUNITY)
Admit: 2014-10-01 | Discharge: 2014-10-01 | Disposition: A | Discharge status: Home / Self Care | Payer: Medicare Other | Source: Ambulatory Visit | Attending: Internal Medicine | Admitting: Internal Medicine

## 2014-10-01 VITALS — BP 146/60 | HR 72 | Wt 180.5 lb

## 2014-10-01 DIAGNOSIS — I5042 Chronic combined systolic (congestive) and diastolic (congestive) heart failure: Secondary | ICD-10-CM | POA: Insufficient documentation

## 2014-10-01 DIAGNOSIS — I5032 Chronic diastolic (congestive) heart failure: Secondary | ICD-10-CM

## 2014-10-01 DIAGNOSIS — J90 Pleural effusion, not elsewhere classified: Secondary | ICD-10-CM | POA: Insufficient documentation

## 2014-10-01 DIAGNOSIS — I509 Heart failure, unspecified: Secondary | ICD-10-CM

## 2014-10-01 DIAGNOSIS — I5033 Acute on chronic diastolic (congestive) heart failure: Secondary | ICD-10-CM

## 2014-10-01 DIAGNOSIS — G4733 Obstructive sleep apnea (adult) (pediatric): Secondary | ICD-10-CM | POA: Diagnosis not present

## 2014-10-01 DIAGNOSIS — D649 Anemia, unspecified: Secondary | ICD-10-CM | POA: Diagnosis not present

## 2014-10-01 DIAGNOSIS — E1122 Type 2 diabetes mellitus with diabetic chronic kidney disease: Secondary | ICD-10-CM | POA: Diagnosis not present

## 2014-10-01 DIAGNOSIS — J189 Pneumonia, unspecified organism: Secondary | ICD-10-CM

## 2014-10-01 DIAGNOSIS — G4730 Sleep apnea, unspecified: Secondary | ICD-10-CM

## 2014-10-01 DIAGNOSIS — R001 Bradycardia, unspecified: Secondary | ICD-10-CM | POA: Insufficient documentation

## 2014-10-01 DIAGNOSIS — J9610 Chronic respiratory failure, unspecified whether with hypoxia or hypercapnia: Secondary | ICD-10-CM | POA: Insufficient documentation

## 2014-10-01 DIAGNOSIS — N184 Chronic kidney disease, stage 4 (severe): Secondary | ICD-10-CM | POA: Insufficient documentation

## 2014-10-01 LAB — BASIC METABOLIC PANEL
Anion gap: 12 (ref 5–15)
BUN: 74 mg/dL — ABNORMAL HIGH (ref 6–20)
CO2: 31 mmol/L (ref 22–32)
Calcium: 9.8 mg/dL (ref 8.9–10.3)
Chloride: 98 mmol/L — ABNORMAL LOW (ref 101–111)
Creatinine, Ser: 2.74 mg/dL — ABNORMAL HIGH (ref 0.44–1.00)
GFR calc Af Amer: 18 mL/min — ABNORMAL LOW (ref >60)
GFR calc non Af Amer: 16 mL/min — ABNORMAL LOW (ref >60)
Glucose, Bld: 190 mg/dL — ABNORMAL HIGH (ref 65–99)
Potassium: 3.6 mmol/L (ref 3.5–5.1)
Sodium: 141 mmol/L (ref 135–145)

## 2014-10-01 NOTE — Progress Notes (Signed)
Advanced Heart Failure Medication Review by a Pharmacist  Does the patient  feel that his/her medications are working for him/her?  yes  Has the patient been experiencing any side effects to the medications prescribed?  no  Does the patient measure his/her own blood pressure or blood glucose at home?  no   Does the patient have any problems obtaining medications due to transportation or finances?   no  Understanding of regimen: good Understanding of indications: good Potential of compliance: good    Pharmacist comments: Patient presents to HF clinic and medications were reviewed with a pharmacist. Patient has cut back to hydralazine BID from TID d/t stomach upset. Also reports that she has not been taking her Imdur although she does not know why. Imdur listed on discharge summary from June 2. No other medication discrepancies noted at this time.   Tiarah Shisler E. Tessa Seaberry, Pharm.D Clinical Pharmacy Resident Pager: [phone] 10/01/2014 11:57 AM

## 2014-10-01 NOTE — Progress Notes (Signed)
Patient ID: Doris Howell, female   DOB: 04-Feb-1936, 79 y.o.   MRN: [id number] PCP: Primary Cardiologist: Dr Mare Ferrari  HPI: 79 yo female with combined systolic/diastolic heart failure, DMII, bradycardia with beta blocker reduced, CKD Stage IV, OSA, and A/C respiratory failure.   RHC in Jan concerning for biventricular failure present with multiple admission for HF.   Admitted 5/19 through 6/2 with increased dyspnea. Diuresed with IV lasix and transitioned to torsemide 40 mg/20 mg . Discharge weight was 180 pounds.   She returns post hospital follow up for HF.  Over all feeling much better. Mild dyspnea with exertion. Sleeps on 2 pillows chronically.  Denies dizziness. Weight at home 166-168 pounds. Appetite poor. She started using CPAP this week. Taking all medication. Following low salt diet. Completed home health.   Echocardiogram 4/16 LVEF 45-50%. C Myoview 1/16 demonstrated no ischemia.   ROS: All systems negative except as listed in HPI, PMH and Problem List.  SH:  History   Social History  . Marital Status: Married    Spouse Name: N/A  . Number of Children: N/A  . Years of Education: N/A   Occupational History  . Not on file.   Social History Main Topics  . Smoking status: Never Smoker   . Smokeless tobacco: Never Used  . Alcohol Use: No  . Drug Use: No  . Sexual Activity: Not Currently   Other Topics Concern  . Not on file   Social History Narrative    FH:  Family History  Problem Relation Age of Onset  . Hypertension Mother   . CAD Neg Hx   . Diabetes Brother     Past Medical History  Diagnosis Date  . Hypertension   . High cholesterol   . Kidney stones   . Type II diabetes mellitus with stage 4 chronic kidney disease   . GERD (gastroesophageal reflux disease)   . History of gout   . CAP (community acquired pneumonia) 01/09/2014    "first time I've ever had it"  . Diastolic congestive heart failure     Current Outpatient Prescriptions   Medication Sig Dispense Refill  . allopurinol (ZYLOPRIM) 100 MG tablet Take 100 mg by mouth 2 (two) times daily.    [name] aspirin EC 81 MG tablet Take 81 mg by mouth daily.    . bimatoprost (LUMIGAN) 0.01 % SOLN Place 1 drop into both eyes at bedtime.     . carvedilol (COREG) 6.25 MG tablet Take 1 tablet (6.25 mg total) by mouth 2 (two) times daily with a meal.    . docusate sodium (COLACE) 100 MG capsule Take 100 mg by mouth 2 (two) times daily.    . Ferrous Sulfate Dried 200 (65 FE) MG TABS Take 1 tablet by mouth daily.    [name] glimepiride (AMARYL) 1 MG tablet Take 0.5 tablets (0.5 mg total) by mouth daily.    . hydrALAZINE (APRESOLINE) 25 MG tablet Take 1 tablet (25 mg total) by mouth 3 (three) times daily. 90 tablet 0  . potassium chloride SA (K-DUR,KLOR-CON) 20 MEQ tablet Take 1 tablet (20 mEq total) by mouth daily. 30 tablet 0  . torsemide (DEMADEX) 20 MG tablet Take 1-2 tablets (20-40 mg total) by mouth 2 (two) times daily. Take 2 tabs daily at 8 AM and 1 tab daily after lunch at 1 PM. 90 tablet 0  . Vitamin D, Cholecalciferol, 400 UNITS CAPS Take 2 capsules by mouth daily.    [name] albuterol (PROVENTIL HFA;VENTOLIN HFA)  108 (90 BASE) MCG/ACT inhaler Inhale 2 puffs into the lungs every 6 (six) hours as needed for wheezing or shortness of breath. (Patient not taking: Reported on 10/01/2014) 1 Inhaler 0  . isosorbide mononitrate (IMDUR) 30 MG 24 hr tablet Take 1 tablet (30 mg total) by mouth daily. (Patient not taking: Reported on 10/01/2014) 30 tablet 0   No current facility-administered medications for this encounter.    Filed Vitals:   10/01/14 1142  BP: 146/60  Pulse: 72  Weight: 180 lb 8 oz (81.874 kg)  SpO2: 97%    PHYSICAL EXAM:  General:  Well appearing. No resp difficulty Daughter present.  HEENT: normal Neck: supple. JVP flat. Carotids 2+ bilaterally; no bruits. No lymphadenopathy or thryomegaly appreciated. Cor: PMI normal. Regular rate & rhythm. No rubs, gallops or  murmurs. Lungs: clear Abdomen: soft, nontender, nondistended. No hepatosplenomegaly. No bruits or masses. Good bowel sounds. Extremities: no cyanosis, clubbing, rash, edema Neuro: alert & orientedx3, cranial nerves grossly intact. Moves all 4 extremities w/o difficulty. Affect pleasant.      ASSESSMENT & PLAN: 1. Chronic combined systolic and diastolic HF- Grade II DD EF 45-50% Normal RV  Had lexiscan January 2016 --> EF 44% no evidence of ischemia Volumes status stable. Continue torsemide 40 mg /20 mg Continue current dose of hydralazine. Has not been taking imdur ? Headache.  No ace or spiro with CKD. Cardiomems not a consideration however GFR to low to pursue.  2. Chronic resp failure - found to be unresponsive in AM of 5/24, noncompliant with CPAP. ? Says she doesn't have CPAP . Has started CPAP this week.  3. Chronic renal insufficiency, stage IV- Solitary kidney Creatinine baseline 2.2 . Check BMET today and forward results to Dr Posey Pronto.  4. Pleural effusion s/p R thoracentesis 5. Bradycardia- Beta blocker cut back on 5/25--> Carvedilol now at 6.25 mg twice a day. Keep at current dose.   6. DM 7. OSA- Using CPAP at night.   8. HCAP- 08/2014 completed anitibiotics in early May.  9. Anemia- normal colonoscopy 07/2014. Last iron level January 2016. Check anemia panel. No source of bleeding.   Follow up 3-4 months   CLEGG,AMY NP-C  4:40 PM

## 2014-10-01 NOTE — Patient Instructions (Signed)
Stop Isosorbide  Labs today   Your physician recommends that you schedule a follow-up appointment in: 3-4 months

## 2014-10-15 ENCOUNTER — Other Ambulatory Visit (HOSPITAL_COMMUNITY): Payer: Self-pay | Admitting: *Deleted

## 2014-10-16 ENCOUNTER — Encounter (HOSPITAL_COMMUNITY)
Admission: RE | Admit: 2014-10-16 | Discharge: 2014-10-16 | Disposition: A | Discharge status: Home / Self Care | Payer: Medicare Other | Source: Ambulatory Visit | Attending: Nephrology | Admitting: Nephrology

## 2014-10-16 DIAGNOSIS — D509 Iron deficiency anemia, unspecified: Secondary | ICD-10-CM | POA: Diagnosis present

## 2014-10-16 MED ORDER — SODIUM CHLORIDE 0.9 % IV SOLN
510.0000 mg | INTRAVENOUS | Status: DC
  Administered 2014-10-16: 510 mg via INTRAVENOUS
  Filled 2014-10-16: qty 17

## 2014-10-22 ENCOUNTER — Encounter (HOSPITAL_COMMUNITY)
Admission: RE | Admit: 2014-10-22 | Discharge: 2014-10-22 | Disposition: A | Discharge status: Home / Self Care | Payer: Medicare Other | Source: Ambulatory Visit | Attending: Nephrology | Admitting: Nephrology

## 2014-10-22 DIAGNOSIS — D509 Iron deficiency anemia, unspecified: Secondary | ICD-10-CM | POA: Diagnosis not present

## 2014-10-22 MED ORDER — SODIUM CHLORIDE 0.9 % IV SOLN
510.0000 mg | INTRAVENOUS | Status: DC
  Administered 2014-10-22: 510 mg via INTRAVENOUS
  Filled 2014-10-22: qty 17

## 2014-10-23 DIAGNOSIS — D509 Iron deficiency anemia, unspecified: Secondary | ICD-10-CM | POA: Diagnosis not present

## 2014-10-28 ENCOUNTER — Encounter (HOSPITAL_BASED_OUTPATIENT_CLINIC_OR_DEPARTMENT_OTHER): Payer: Medicare Other

## 2014-12-22 ENCOUNTER — Ambulatory Visit (INDEPENDENT_AMBULATORY_CARE_PROVIDER_SITE_OTHER): Payer: Medicare Other | Admitting: Cardiology

## 2014-12-22 ENCOUNTER — Encounter: Payer: Self-pay | Admitting: Cardiology

## 2014-12-22 VITALS — BP 156/70 | HR 64 | Ht 61.0 in | Wt 185.0 lb

## 2014-12-22 DIAGNOSIS — I5032 Chronic diastolic (congestive) heart failure: Secondary | ICD-10-CM

## 2014-12-22 DIAGNOSIS — I447 Left bundle-branch block, unspecified: Secondary | ICD-10-CM | POA: Diagnosis not present

## 2014-12-22 DIAGNOSIS — I509 Heart failure, unspecified: Secondary | ICD-10-CM

## 2014-12-22 DIAGNOSIS — I5082 Biventricular heart failure: Secondary | ICD-10-CM

## 2014-12-22 NOTE — Progress Notes (Signed)
Cardiology Office Note   Date:  12/22/2014   ID:  Doris Howell, DOB 1935-04-20, MRN [id number]  PCP:  Irven Shelling, MD  Cardiologist: Darlin Coco MD  No chief complaint on file.    History of Present Illness: Doris Howell is a 79 y.o. female who presents for a scheduled follow-up visit.  Doris Howell is a 79 y.o. female who presents for follow-up after recent hospitalization in April 2016 for exacerbation of chronic diastolic heart failure. During an earlier admission in January 2016 the patient had a right heart catheterization on 04/15/14 and she was found to have a pulmonary artery pressure of 64/25 with a mean of 40. The right ventricular end-diastolic pressure was 15. The impression was biventricular heart failure with preserved left ventricular ejection fraction and moderate pulmonary artery hypertension. The patient responded well to diuresis. Since being discharged her breathing has improved. Her weight has been stable at home. She sleeps on 2 pillows and the head of her bed is elevated on bricks. She is not having any paroxysmal nocturnal dyspnea. The patient has severe kidney disease and left heart catheterization was avoided because of her renal insufficiency. The patient had an echocardiogram on 07/31/14 which showed an ejection fraction of 45-50% with grade 2 diastolic dysfunction and mild mitral regurgitation. The patient has a history of diabetes. She denies any hypoglycemic episodes. Since last visit she has been doing reasonably well. She had a Lexiscan Myoview on 05/05/14 which showed left ventricular systolic dysfunction with ejection fraction 44%. There was no ischemia. The patient has not been having any chest pain. She has a history of sleep apnea.She now has a CPAP machine which is new since last visit The patient has not been having any symptoms of gout. She is not having any hypoglycemic episodes. She has been  experiencing nocturia 2-3 times a night. She has been having some intermittent right upper quadrant discomfort which sounds like a muscular cramp.  It does not have the characteristics of gallbladder colic.  Past Medical History  Diagnosis Date  . Hypertension   . High cholesterol   . Kidney stones   . Type II diabetes mellitus with stage 4 chronic kidney disease   . GERD (gastroesophageal reflux disease)   . History of gout   . CAP (community acquired pneumonia) 01/09/2014    "first time I've ever had it"  . Diastolic congestive heart failure     Past Surgical History  Procedure Laterality Date  . Kidney stone surgery  10-05-12    "cut me on the side"  . Colonoscopy with propofol N/A 10/23/2012    Procedure: COLONOSCOPY WITH PROPOFOL;  Surgeon: [name], MD;  Location: [facility];  Service: Endoscopy;  Laterality: N/A;  . Abdominal hysterectomy  ~ 1974  . Lithotripsy  1980's?  . Cataract extraction w/ intraocular lens  implant, bilateral Bilateral 2014  . Right heart catheterization N/A 04/15/2014    Procedure: RIGHT HEART CATH;  Surgeon: Sanda Klein, MD;  [facility]: [facility];  Service: Cardiovascular;  Laterality: N/A;     Current Outpatient Prescriptions  Medication Sig Dispense Refill  . albuterol (PROVENTIL HFA;VENTOLIN HFA) 108 (90 BASE) MCG/ACT inhaler Inhale 2 puffs into the lungs every 6 (six) hours as needed for wheezing or shortness of breath. 1 Inhaler 0  . allopurinol (ZYLOPRIM) 100 MG tablet Take 100 mg by mouth 2 (two) times daily.    [name] aspirin EC 81 MG tablet Take 81 mg by  mouth daily.    . bimatoprost (LUMIGAN) 0.01 % SOLN Place 1 drop into both eyes at bedtime.     . carvedilol (COREG) 6.25 MG tablet Take 1 tablet (6.25 mg total) by mouth 2 (two) times daily with a meal.    . docusate sodium (COLACE) 100 MG capsule Take 100 mg by mouth 2 (two) times daily.    . Ferrous Sulfate Dried 200 (65 FE) MG TABS Take 1 tablet by mouth daily.    [name] glimepiride  (AMARYL) 1 MG tablet Take 0.5 tablets (0.5 mg total) by mouth daily.    . hydrALAZINE (APRESOLINE) 25 MG tablet Take 25 mg by mouth daily.    . potassium chloride SA (K-DUR,KLOR-CON) 20 MEQ tablet Take 1 tablet (20 mEq total) by mouth daily. 30 tablet 0  . torsemide (DEMADEX) 20 MG tablet Take 1-2 tablets (20-40 mg total) by mouth 2 (two) times daily. Take 2 tabs daily at 8 AM and 1 tab daily after lunch at 1 PM. 90 tablet 0  . Vitamin D, Cholecalciferol, 400 UNITS CAPS Take 2 capsules by mouth daily.     No current facility-administered medications for this visit.    Allergies:   Simvastatin and Codeine    Social History:  The patient  reports that she has never smoked. She has never used smokeless tobacco. She reports that she does not drink alcohol or use illicit drugs.   Family History:  The patient's family history includes Diabetes in her brother; Hypertension in her mother. There is no history of CAD.    ROS:  Please see the history of present illness.   Otherwise, review of systems are positive for none.   All other systems are reviewed and negative.    PHYSICAL EXAM: VS:  BP 156/70 mmHg  Pulse 64  Ht 5\' 1"  (1.549 m)  Wt 185 lb (83.915 kg)  BMI 34.97 kg/m2 , BMI Body mass index is 34.97 kg/(m^2). GEN: Well nourished, well developed, in no acute distress HEENT: normal Neck: no JVD, carotid bruits, or masses Cardiac: RRR; no murmurs, rubs, or gallops,no edema  Respiratory:  clear to auscultation bilaterally, normal work of breathing GI: soft, nontender, nondistended, + BS MS: no deformity or atrophy Skin: warm and dry, no rash Neuro:  Strength and sensation are intact Psych: euthymic mood, full affect   EKG:  EKG is ordered today. The ekg ordered today demonstrates normal sinus rhythm with PVCs.  Left bundle branch block.  Since prior tracing of 08/29/14, no significant change   Recent Labs: 02/05/2014: Pro B Natriuretic peptide (BNP) 2767.0* 07/29/2014: Magnesium 2.1;  TSH 1.984 08/28/2014: ALT 23 09/11/2014: B Natriuretic Peptide 276.8*; Hemoglobin 8.5*; Platelets 256 10/01/2014: BUN 74*; Creatinine, Ser 2.74*; Potassium 3.6; Sodium 141    Lipid Panel No results found for: CHOL, TRIG, HDL, CHOLHDL, VLDL, LDLCALC, LDLDIRECT    Wt Readings from Last 3 Encounters:  12/22/14 185 lb (83.915 kg)  10/22/14 180 lb (81.647 kg)  10/16/14 180 lb (81.647 kg)        ASSESSMENT AND PLAN:  1.  Biventricular congestive heart failure, improved. Ejection fraction 50% by echo. Ejection fraction 44% by Lexiscan Myoview on 05/05/14. No reversible ischemia 2. Renal insufficiency stage 3 3. Anemia of chronic disease 4.  Left bundle branch block, chronic   Current medicines are reviewed at length with the patient today.  The patient does not have concerns regarding medicines.  The following changes have been made:  no change  Labs/ tests  ordered today include:   Orders Placed This Encounter  Procedures  . EKG 12-Lead     Disposition: Continue current medication.  She is taking hydralazine just once a day because of GI side effects.  Recheck in 6 months for office visit.  She understands she will be seeing a new cardiologist at that point.  Berna Spare MD 12/22/2014 1:11 PM    [address] [place], [place], [place]  [address] Phone: [phone]; Fax: [phone]

## 2014-12-22 NOTE — Patient Instructions (Signed)
Medication Instructions:  Your physician recommends that you continue on your current medications as directed. Please refer to the Current Medication list given to you today.  Labwork: none  Testing/Procedures: none  Follow-Up: Your physician wants you to follow-up in: 6 month ov You will receive a reminder letter in the mail two months in advance. If you don't receive a letter, please call our office to schedule the follow-up appointment.

## 2015-09-03 ENCOUNTER — Other Ambulatory Visit: Payer: Self-pay | Admitting: Internal Medicine

## 2015-09-03 DIAGNOSIS — K862 Cyst of pancreas: Secondary | ICD-10-CM

## 2015-09-14 ENCOUNTER — Ambulatory Visit
Admission: RE | Admit: 2015-09-14 | Discharge: 2015-09-14 | Disposition: A | Discharge status: Home / Self Care | Payer: Medicare Other | Source: Ambulatory Visit | Attending: Internal Medicine | Admitting: Internal Medicine

## 2015-09-14 DIAGNOSIS — K862 Cyst of pancreas: Secondary | ICD-10-CM

## 2016-04-28 ENCOUNTER — Encounter: Payer: Medicare Other | Admitting: Cardiovascular Disease

## 2016-05-03 ENCOUNTER — Encounter: Payer: Self-pay | Admitting: Cardiovascular Disease

## 2016-05-03 ENCOUNTER — Ambulatory Visit (INDEPENDENT_AMBULATORY_CARE_PROVIDER_SITE_OTHER): Payer: Medicare Other | Admitting: Cardiovascular Disease

## 2016-05-03 ENCOUNTER — Encounter (INDEPENDENT_AMBULATORY_CARE_PROVIDER_SITE_OTHER): Payer: Self-pay

## 2016-05-03 VITALS — BP 142/68 | HR 71 | Ht 61.0 in | Wt 181.1 lb

## 2016-05-03 DIAGNOSIS — I272 Pulmonary hypertension, unspecified: Secondary | ICD-10-CM

## 2016-05-03 DIAGNOSIS — I5043 Acute on chronic combined systolic (congestive) and diastolic (congestive) heart failure: Secondary | ICD-10-CM

## 2016-05-03 DIAGNOSIS — I5042 Chronic combined systolic (congestive) and diastolic (congestive) heart failure: Secondary | ICD-10-CM

## 2016-05-03 NOTE — Addendum Note (Signed)
Addended by: Emmaline Life on: 05/03/2016 01:03 PM   Modules accepted: Orders

## 2016-05-03 NOTE — Progress Notes (Signed)
Cardiology Office Note   Date:  05/03/2016   ID:  Doris, Howell 16-Aug-1935, MRN [id number]  PCP:  Irven Shelling, MD  Cardiologist: Darlin Coco MD -- > now [place]  Chief Complaint  Patient presents with  . Follow-up    CHF   Problem list 1. Moderate to severe pulmonary hypertension - with right heart failure 2. Chronic kidney disease 3. Chronic combined  systolic and diastolic congestive heart failure  History of Present Illness: Doris Howell is a 81 y.o. female who presents for a scheduled follow-up visit.  Doris Howell is a 81 y.o. female who presents for follow-up after recent hospitalization in April 2016 for exacerbation of chronic diastolic heart failure. During an earlier admission in January 2016 the patient had a right heart catheterization on 04/15/14 and she was found to have a pulmonary artery pressure of 64/25 with a mean of 40. The right ventricular end-diastolic pressure was 15. The impression was biventricular heart failure with preserved left ventricular ejection fraction and moderate pulmonary artery hypertension. The patient responded well to diuresis. Since being discharged her breathing has improved. Her weight has been stable at home. She sleeps on 2 pillows and the head of her bed is elevated on bricks. She is not having any paroxysmal nocturnal dyspnea. The patient has severe kidney disease and left heart catheterization was avoided because of her renal insufficiency. The patient had an echocardiogram on 07/31/14 which showed an ejection fraction of 45-50% with grade 2 diastolic dysfunction and mild mitral regurgitation. The patient has a history of diabetes. She denies any hypoglycemic episodes. Since last visit she has been doing reasonably well. She had a Lexiscan Myoview on 05/05/14 which showed left ventricular systolic dysfunction with ejection fraction 44%. There was no ischemia. The patient has not been having  any chest pain. She has a history of sleep apnea.She now has a CPAP machine which is new since last visit The patient has not been having any symptoms of gout. She is not having any hypoglycemic episodes. She has been experiencing nocturia 2-3 times a night. She has been having some intermittent right upper quadrant discomfort which sounds like a muscular cramp.  It does not have the characteristics of gallbladder colic.   [name] 23, 2018:  Doris Howell is seen for the first time today - transfer from [place] Has had more shortness of breath this past weekend.    Does not eat salt.  Breathing is better.     Past Medical History:  Diagnosis Date  . CAP (community acquired pneumonia) 01/09/2014   "first time I've ever had it"  . Diastolic congestive heart failure ([place])   . GERD (gastroesophageal reflux disease)   . High cholesterol   . History of gout   . Hypertension   . Kidney stones   . Type II diabetes mellitus with stage 4 chronic kidney disease (HCC)     Past Surgical History:  Procedure Laterality Date  . ABDOMINAL HYSTERECTOMY  ~ 1974  . CATARACT EXTRACTION W/ INTRAOCULAR LENS  IMPLANT, BILATERAL Bilateral 2014  . COLONOSCOPY WITH PROPOFOL N/A 10/23/2012   Procedure: COLONOSCOPY WITH PROPOFOL;  Surgeon: [name], MD;  Location: [facility];  Service: Endoscopy;  Laterality: N/A;  . KIDNEY STONE SURGERY  10-05-12   "cut me on the side"  . LITHOTRIPSY  1980's?  . RIGHT HEART CATHETERIZATION N/A 04/15/2014   Procedure: RIGHT HEART CATH;  Surgeon: Sanda Klein, MD;  Location: [facility] CATH LAB;  Service: Cardiovascular;  Laterality: N/A;     Current Outpatient Prescriptions  Medication Sig Dispense Refill  . albuterol (PROVENTIL HFA;VENTOLIN HFA) 108 (90 BASE) MCG/ACT inhaler Inhale 2 puffs into the lungs every 6 (six) hours as needed for wheezing or shortness of breath. 1 Inhaler 0  . allopurinol (ZYLOPRIM) 100 MG tablet Take 100 mg by mouth 2 (two) times daily.    [name]  aspirin EC 81 MG tablet Take 81 mg by mouth daily.    . bimatoprost (LUMIGAN) 0.01 % SOLN Place 1 drop into both eyes at bedtime.     . carvedilol (COREG) 6.25 MG tablet Take 1 tablet (6.25 mg total) by mouth 2 (two) times daily with a meal.    . docusate sodium (COLACE) 100 MG capsule Take 100 mg by mouth 2 (two) times daily.    . Ferrous Sulfate Dried 200 (65 FE) MG TABS Take 1 tablet by mouth daily.    [name] glimepiride (AMARYL) 1 MG tablet Take 0.5 tablets (0.5 mg total) by mouth daily.    . hydrALAZINE (APRESOLINE) 25 MG tablet Take 25 mg by mouth daily.    . potassium chloride SA (K-DUR,KLOR-CON) 20 MEQ tablet Take 1 tablet (20 mEq total) by mouth daily. 30 tablet 0  . torsemide (DEMADEX) 20 MG tablet Take 1-2 tablets (20-40 mg total) by mouth 2 (two) times daily. Take 2 tabs daily at 8 AM and 1 tab daily after lunch at 1 PM. 90 tablet 0  . Vitamin D, Cholecalciferol, 400 UNITS CAPS Take 2 capsules by mouth daily.     No current facility-administered medications for this visit.     Allergies:   Simvastatin and Codeine    Social History:  The patient  reports that she has never smoked. She has never used smokeless tobacco. She reports that she does not drink alcohol or use drugs.   Family History:  The patient's family history includes Diabetes in her brother; Hypertension in her mother.    ROS:  Please see the history of present illness.   Otherwise, review of systems are positive for none.   All other systems are reviewed and negative.    PHYSICAL EXAM: VS:  BP (!) 142/68   Pulse 71   Ht 5\' 1"  (1.549 m)   Wt 181 lb 1.9 oz (82.2 kg)   SpO2 98%   BMI 34.22 kg/m  , BMI Body mass index is 34.22 kg/m. GEN: Well nourished, well developed, in no acute distress  HEENT: normal  Neck: no JVD, carotid bruits, or masses Cardiac: RRR  With frequent PVCs ; no murmurs, rubs, or gallops,no edema  Respiratory:  clear to auscultation bilaterally, normal work of breathing GI: soft, nontender,  nondistended, + BS MS: no deformity or atrophy  Skin: warm and dry, no rash Neuro:  Strength and sensation are intact Psych: euthymic mood, full affect   EKG:  EKG is ordered today. The ekg ordered today demonstrates normal sinus rhythm with  Frequent PVCs.  Left bundle branch block.  Since prior tracing of 08/29/14, no significant change   Recent Labs: No results found for requested labs within last 8760 hours.    Lipid Panel No results found for: CHOL, TRIG, HDL, CHOLHDL, VLDL, LDLCALC, LDLDIRECT    Wt Readings from Last 3 Encounters:  05/03/16 181 lb 1.9 oz (82.2 kg)  12/22/14 185 lb (83.9 kg)  10/22/14 180 lb (81.6 kg)        ASSESSMENT AND PLAN:  1.  Biventricular  congestive heart failure, improved. Ejection fraction 50% by echo. Ejection fraction 44% by Lexiscan Myoview on 05/05/14. No reversible ischemia  2. Renal insufficiency stage 3 - will check BMP today   3. Anemia of chronic disease 4.  Left bundle branch block, chronic   Current medicines are reviewed at length with the patient today.  The patient does not have concerns regarding medicines.  The following changes have been made:  no change  Labs/ tests ordered today include:   No orders of the defined types were placed in this encounter.

## 2016-05-03 NOTE — Patient Instructions (Signed)
Medication Instructions:  Your physician recommends that you continue on your current medications as directed. Please refer to the Current Medication list given to you today.   Labwork: TODAY - basic metabolic panel   Testing/Procedures: None Ordered   Follow-Up: Your physician wants you to follow-up in: 6 months with Dr. Nahser. You will receive a reminder letter in the mail two months in advance. If you don't receive a letter, please call our office to schedule the follow-up appointment.   If you need a refill on your cardiac medications before your next appointment, please call your pharmacy.   Thank you for choosing [facility]! Jisel Fleet, RN [phone]    

## 2016-05-04 LAB — BASIC METABOLIC PANEL
BUN/Creatinine Ratio: 27 (ref 12–28)
BUN: 78 mg/dL (ref 8–27)
CO2: 28 mmol/L (ref 18–29)
Calcium: 9.5 mg/dL (ref 8.7–10.3)
Chloride: 99 mmol/L (ref 96–106)
Creatinine, Ser: 2.85 mg/dL — ABNORMAL HIGH (ref 0.57–1.00)
GFR calc Af Amer: 18 mL/min/{1.73_m2} — ABNORMAL LOW (ref >59)
GFR calc non Af Amer: 15 mL/min/{1.73_m2} — ABNORMAL LOW (ref >59)
Glucose: 103 mg/dL — ABNORMAL HIGH (ref 65–99)
Potassium: 3.9 mmol/L (ref 3.5–5.2)
Sodium: 145 mmol/L — ABNORMAL HIGH (ref 134–144)

## 2016-06-09 ENCOUNTER — Telehealth: Payer: Self-pay | Admitting: Cardiovascular Disease

## 2016-06-09 NOTE — Telephone Encounter (Signed)
New message      Calling to get pt's most recent bp and ejection fraction/echo report

## 2016-06-09 NOTE — Telephone Encounter (Signed)
I left a detailed message on confidential voice mail of Megan at [facility] with the information she requested.

## 2016-07-03 IMAGING — US US RENAL
1 series · 14 of 16 positions shown · non-contrast
Comparison: CT 04/05/2014

CLINICAL DATA: Patient with acute renal failure. Prior left
nephrectomy.

EXAM:
RENAL / URINARY TRACT ULTRASOUND COMPLETE

[Series 1: us renal · 0.22mm/px · 14 of 16 slices shown]
[im 1/16]
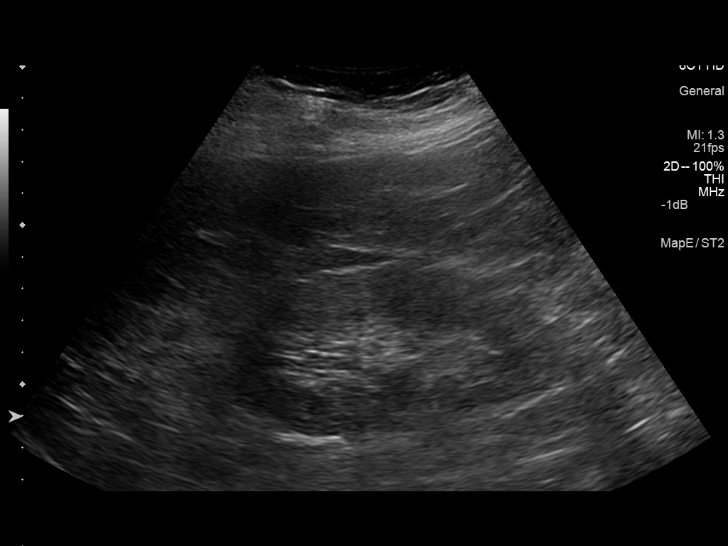
[im 2/16]
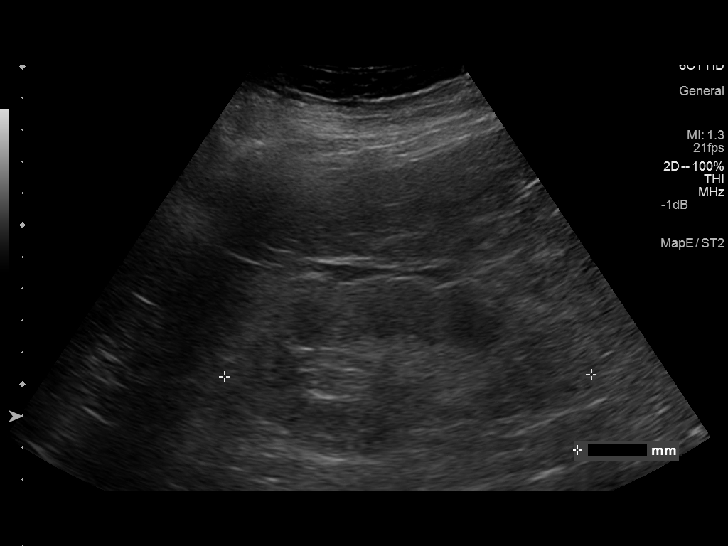
[im 3/16]
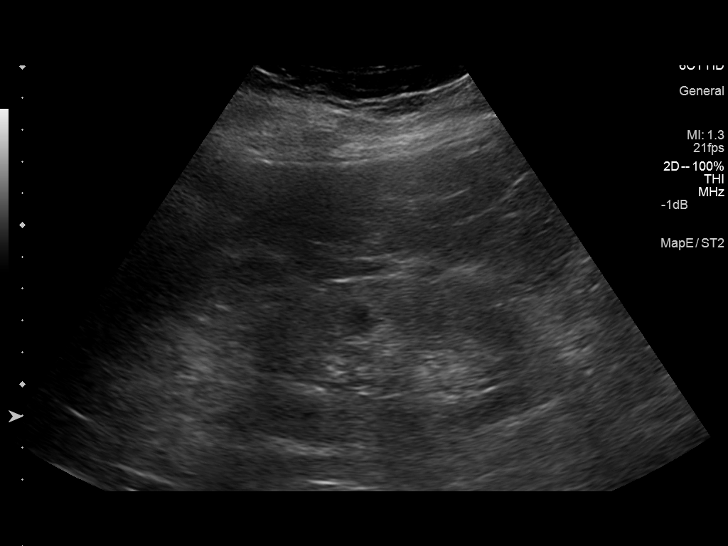
[im 5/16]
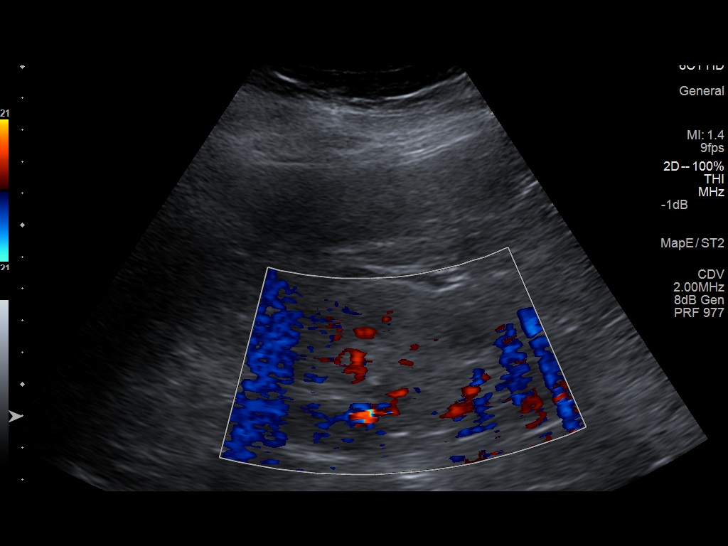
[im 6/16]
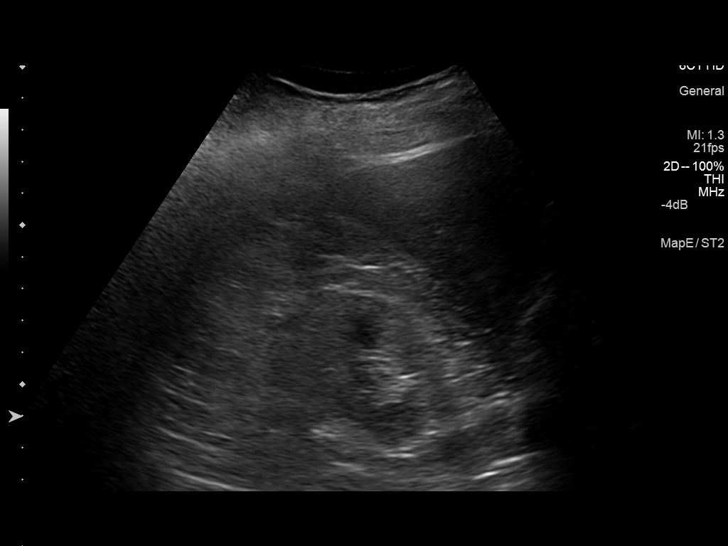
[im 7/16]
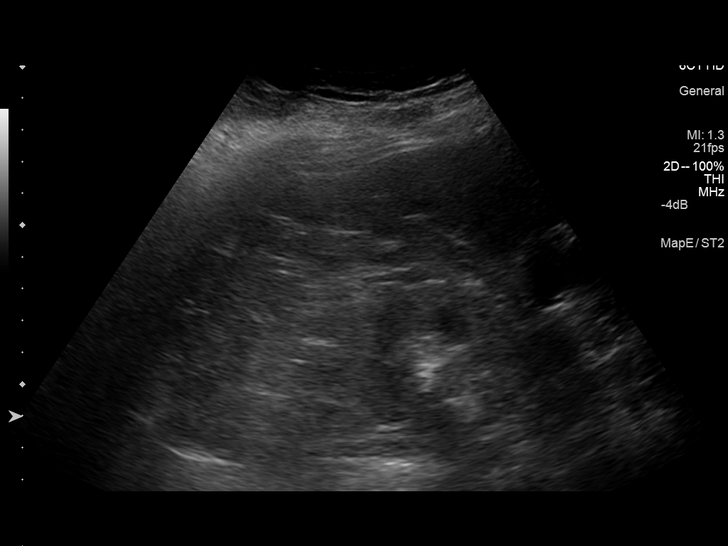
[im 8/16]
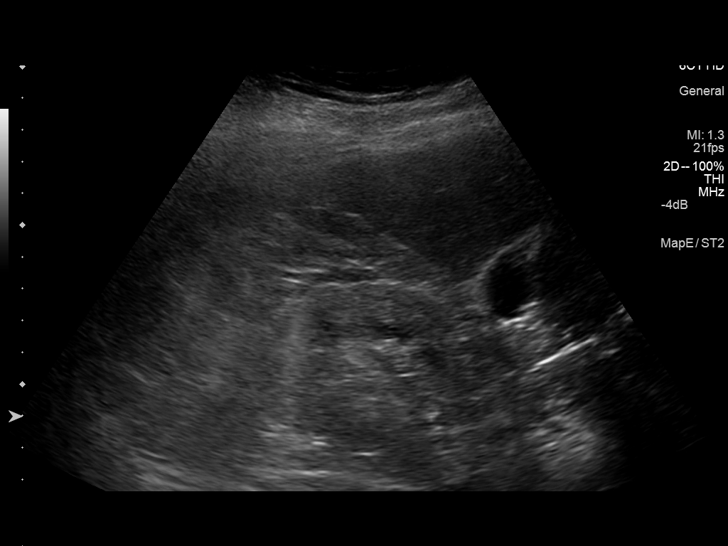
[im 9/16]
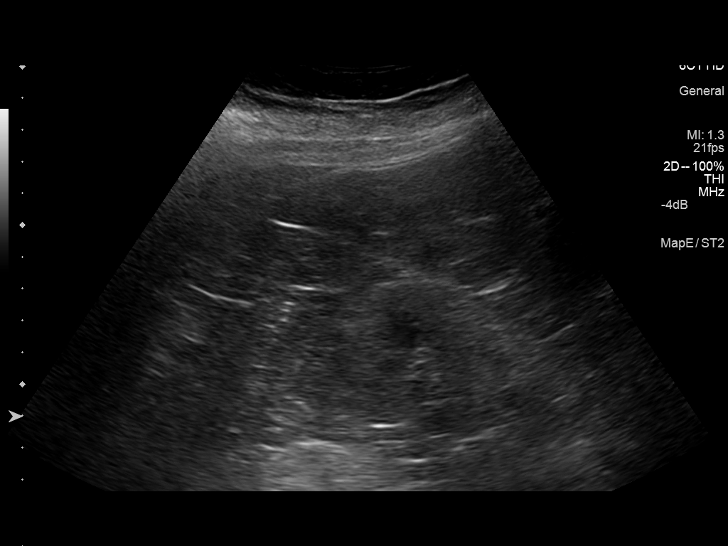
[im 10/16]
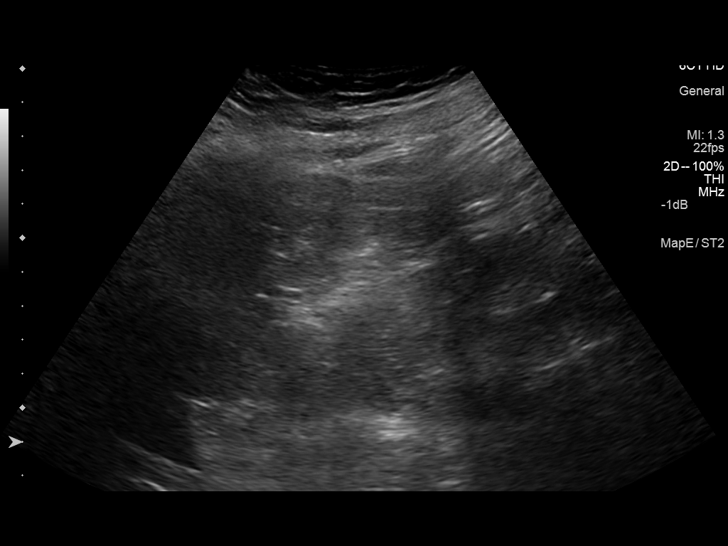
[im 11/16]
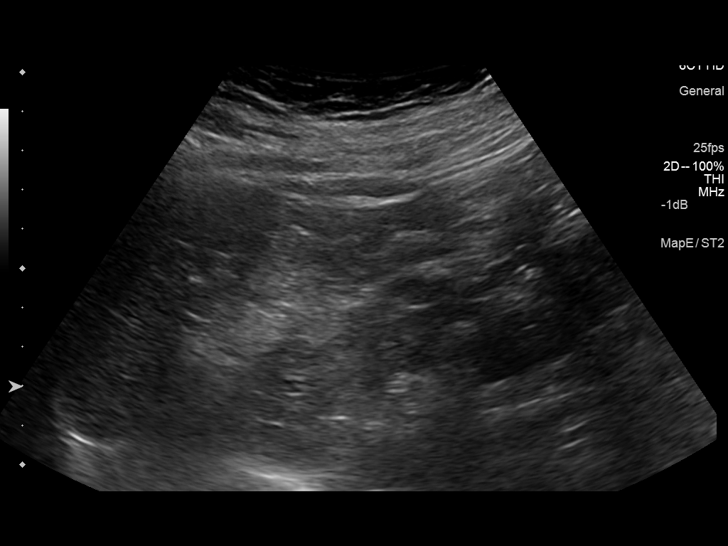
[im 13/16]
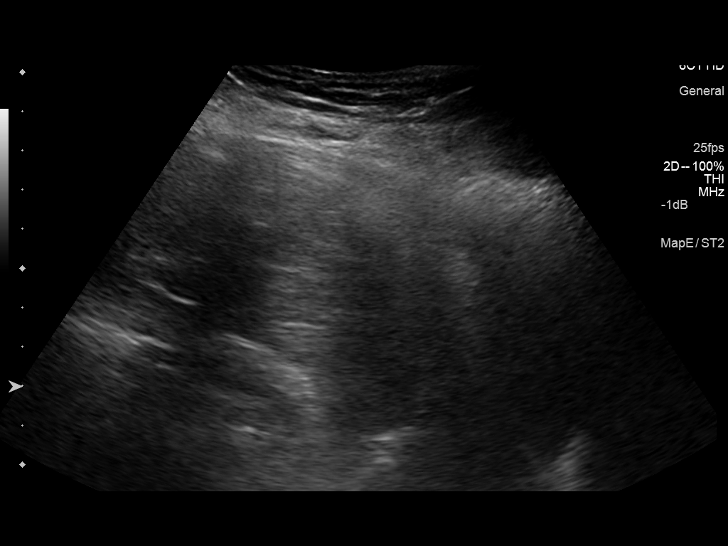
[im 14/16]
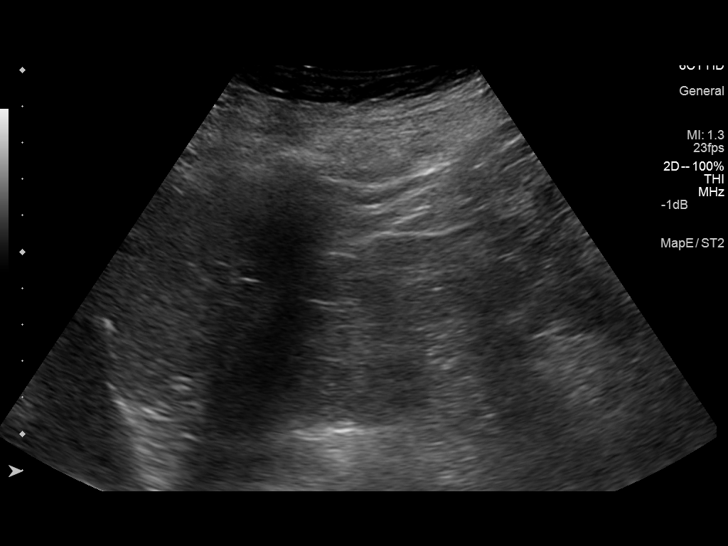
[im 15/16]
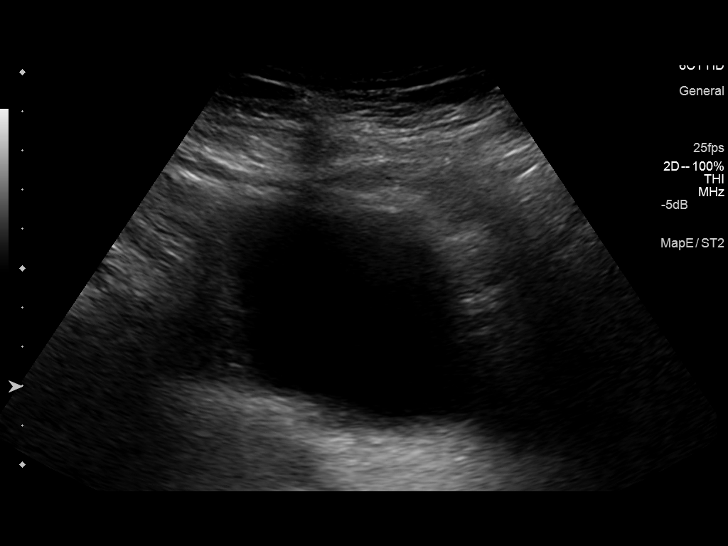
[im 16/16]
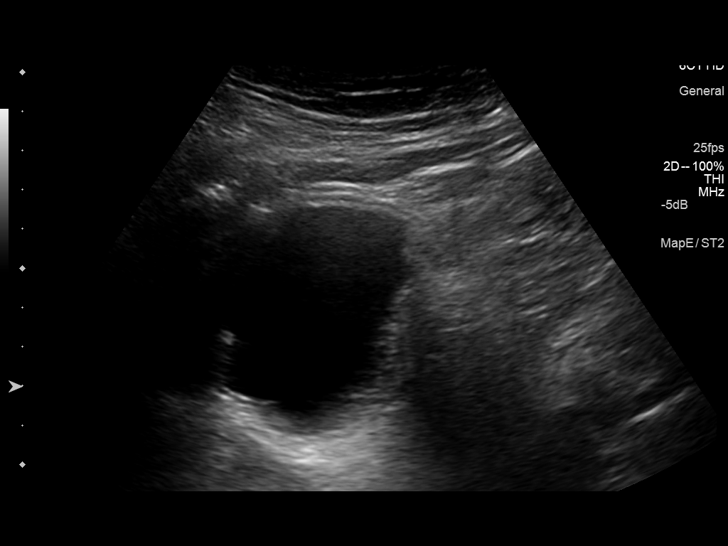

[14 of 16 positions shown; findings below may reference images not displayed]

FINDINGS: Right Kidney:

Length: 11.5 cm. Diffusely increased renal parenchymal echogenicity.
No hydronephrosis.

Left Kidney:

Surgically absent.

Bladder:

Appears normal for degree of bladder distention.
IMPRESSION: Increased renal parenchymal echogenicity suggestive of chronic
medical renal disease. No hydronephrosis.

## 2016-07-05 IMAGING — CR DG CHEST 1V
1 series · 1 of 1 positions shown · non-contrast
Comparison: Chest radiograph 09/02/2014

CLINICAL DATA: Right pleural effusion status post thoracentesis.

EXAM:
CHEST  1 VIEW

[chest pa]
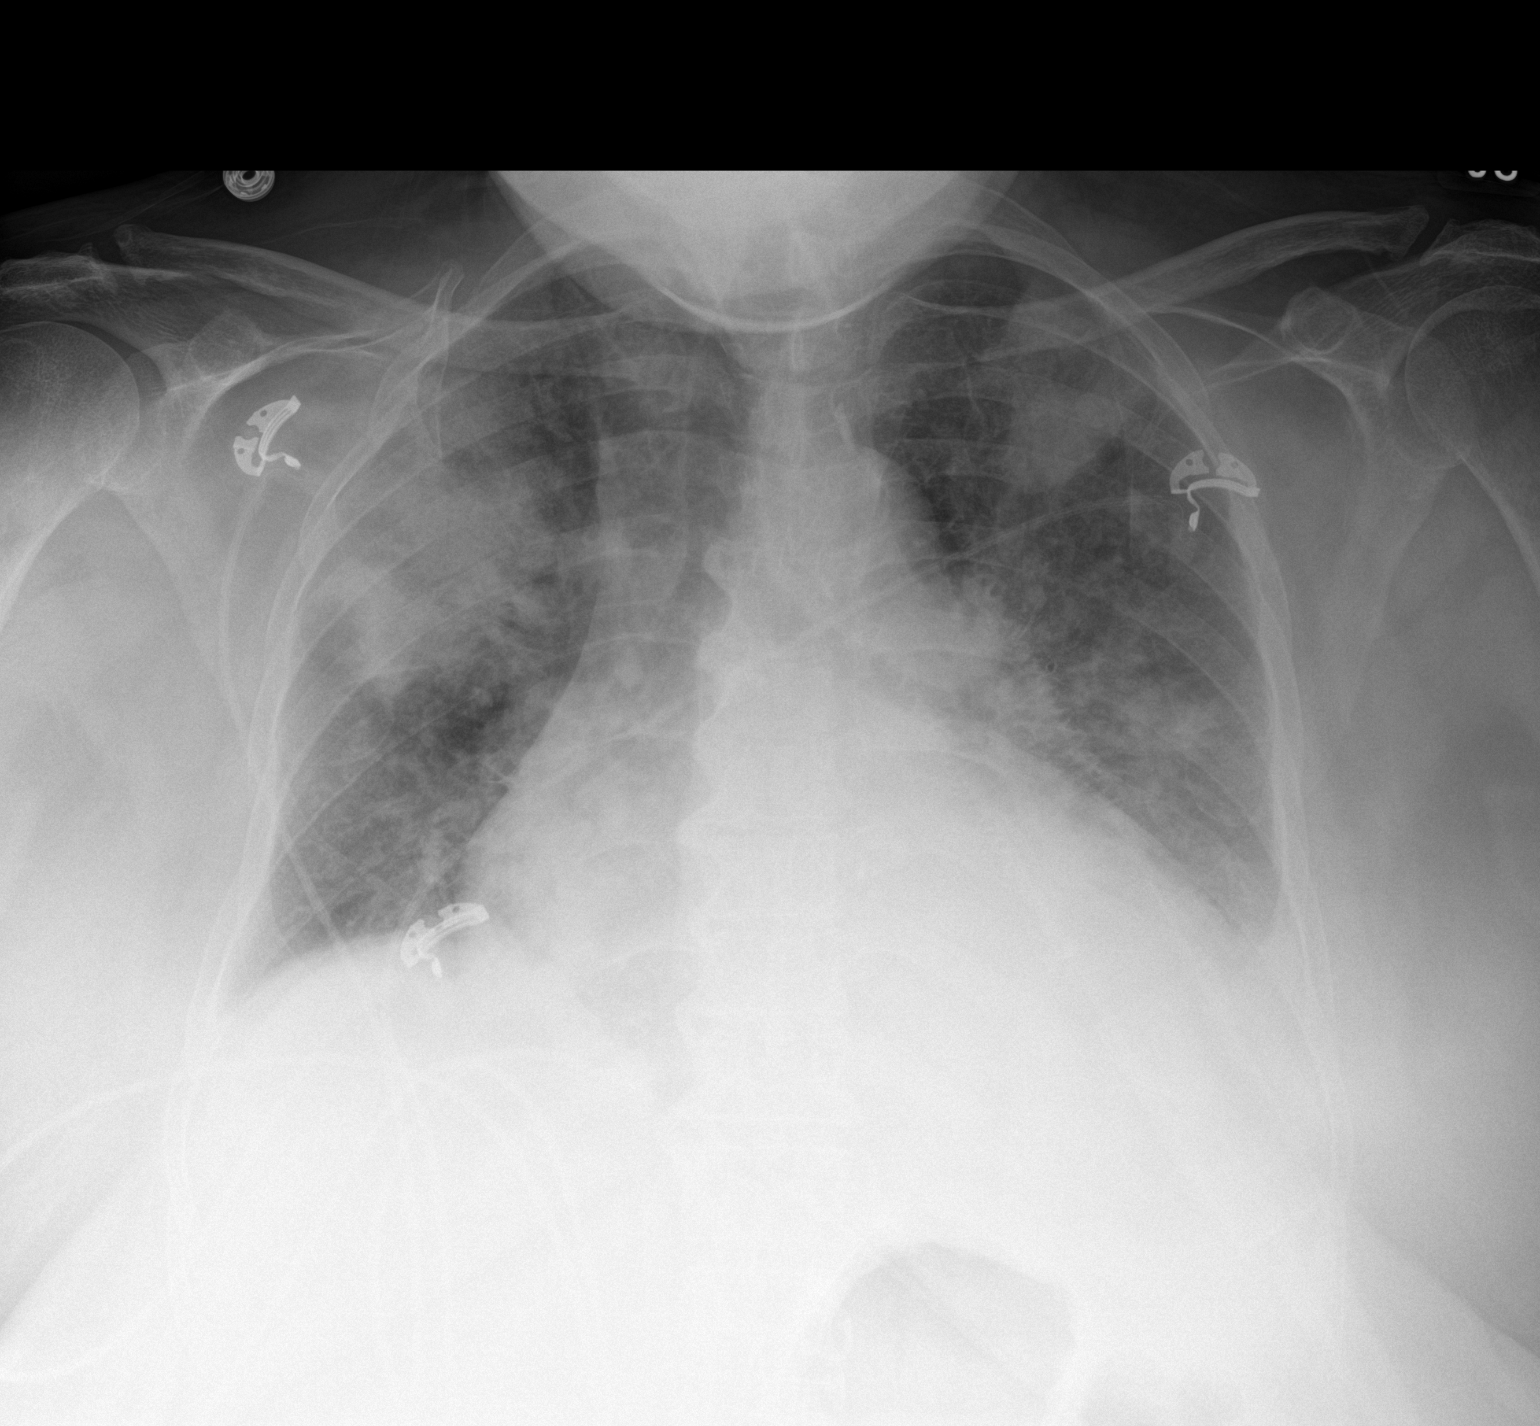

[1 of 1 positions shown; findings below may reference images not displayed]

FINDINGS: Monitoring leads overlie the patient. Stable cardiomegaly. Grossly
unchanged right-greater-than-left mid and upper lung pulmonary
consolidative opacities. Persistent small layering left pleural
effusion with underlying pulmonary consolidation suggestive of
associated atelectasis. Bilateral interstitial pulmonary opacities.
Interval decrease in size of right pleural effusion. No definite
pneumothorax.
IMPRESSION: Persistent right-greater-than-left mid and upper lung airspace
opacities potentially secondary to multi focal pneumonia.

Slight interval improvement interstitial pulmonary edema.
Cardiomegaly.

Persistent small left layering pleural effusion.

Interval decrease in size right pleural effusion.

## 2016-07-05 IMAGING — CR DG ABDOMEN 2V
3 series · 3 of 3 positions shown · non-contrast
Comparison: CT 04/05/2014

CLINICAL DATA: Patient with abdominal distension.

EXAM:
ABDOMEN - 2 VIEW

[AP (1 of 2)]
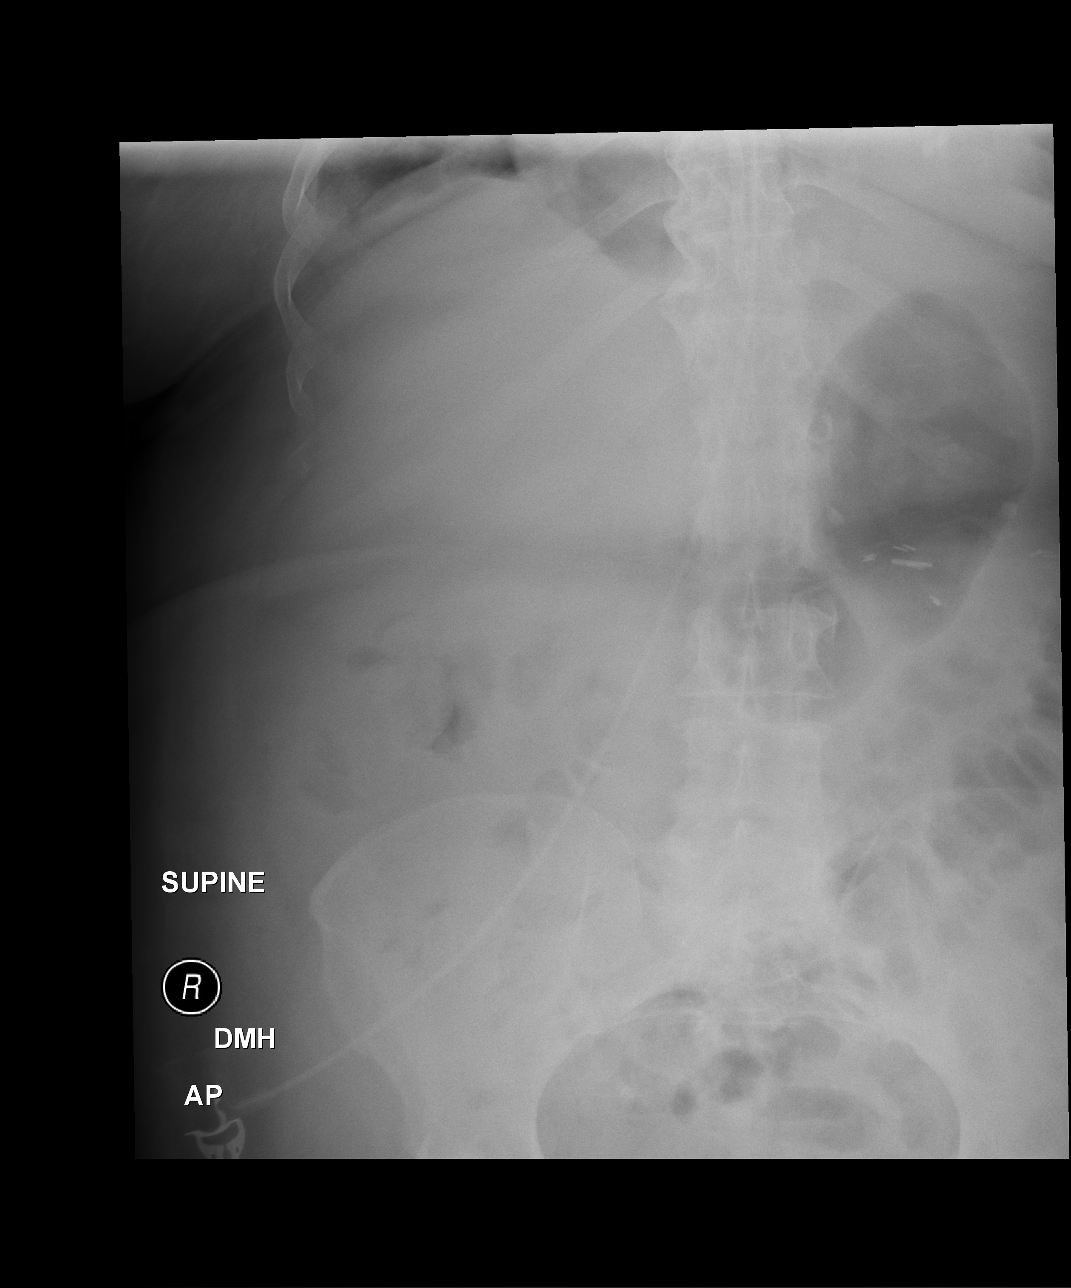

[AP (2 of 2)]
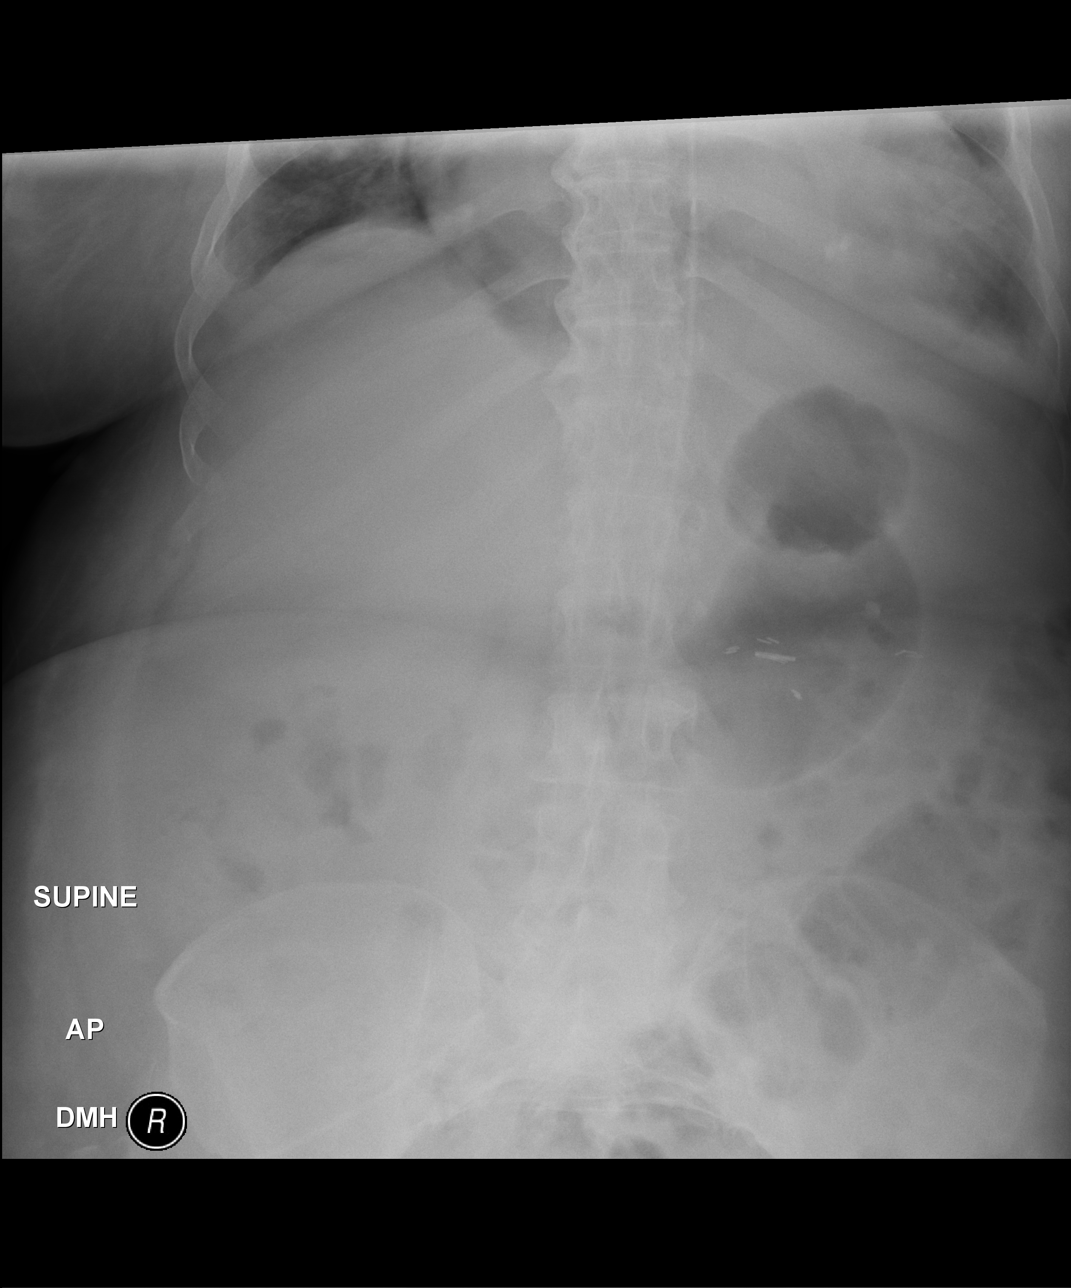

[ap lld]
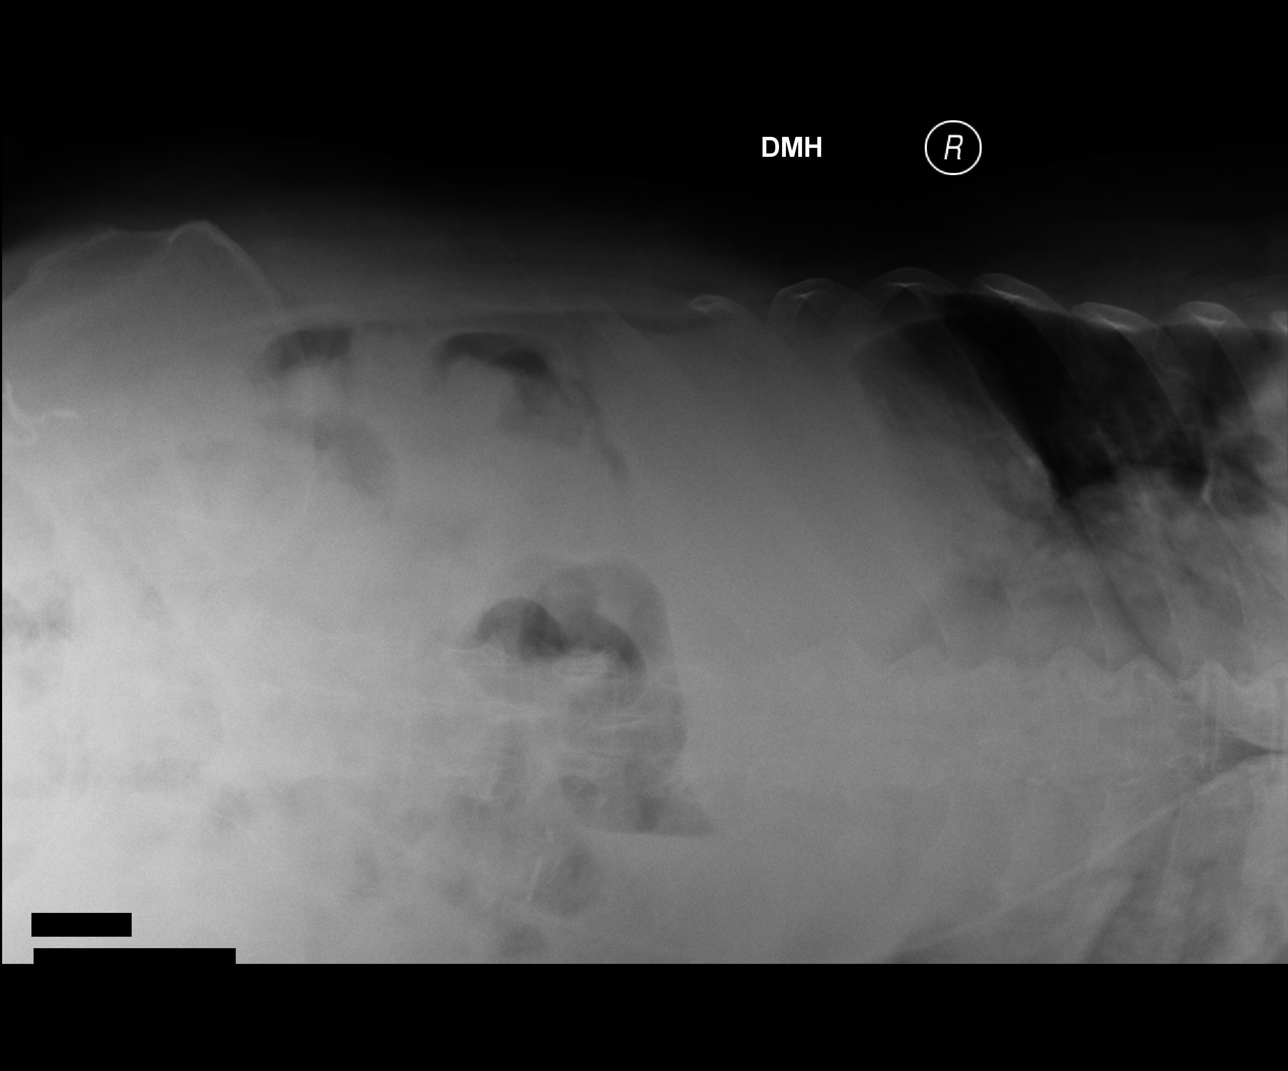

[3 of 3 positions shown; findings below may reference images not displayed]

FINDINGS: Gas is demonstrated within nondilated loops of large and small
bowel. No evidence for bowel obstruction. Decubitus images
demonstrate no free intraperitoneal air. Surgical clips left mid
abdomen. Cardiomegaly. Bibasilar heterogeneous opacities.
Calcifications in left renal fossa.
IMPRESSION: Nonobstructed bowel gas pattern.

Cardiomegaly.

Basilar heterogeneous opacities compatible with known pleural
effusions possible atelectasis.

## 2016-08-30 ENCOUNTER — Emergency Department (HOSPITAL_COMMUNITY): Payer: Medicare Other

## 2016-08-30 ENCOUNTER — Encounter (HOSPITAL_COMMUNITY): Payer: Self-pay

## 2016-08-30 ENCOUNTER — Ambulatory Visit (HOSPITAL_COMMUNITY)
Admission: EM | Admit: 2016-08-30 | Discharge: 2016-08-30 | Disposition: A | Discharge status: Nursing Home (Includes ICF) | Payer: Medicare Other

## 2016-08-30 DIAGNOSIS — N184 Chronic kidney disease, stage 4 (severe): Secondary | ICD-10-CM | POA: Diagnosis not present

## 2016-08-30 DIAGNOSIS — I5043 Acute on chronic combined systolic (congestive) and diastolic (congestive) heart failure: Secondary | ICD-10-CM | POA: Diagnosis not present

## 2016-08-30 DIAGNOSIS — R0789 Other chest pain: Secondary | ICD-10-CM | POA: Insufficient documentation

## 2016-08-30 DIAGNOSIS — Z79899 Other long term (current) drug therapy: Secondary | ICD-10-CM | POA: Insufficient documentation

## 2016-08-30 DIAGNOSIS — E1122 Type 2 diabetes mellitus with diabetic chronic kidney disease: Secondary | ICD-10-CM | POA: Insufficient documentation

## 2016-08-30 DIAGNOSIS — R071 Chest pain on breathing: Secondary | ICD-10-CM | POA: Diagnosis present

## 2016-08-30 DIAGNOSIS — I130 Hypertensive heart and chronic kidney disease with heart failure and stage 1 through stage 4 chronic kidney disease, or unspecified chronic kidney disease: Secondary | ICD-10-CM | POA: Insufficient documentation

## 2016-08-30 LAB — CBC
HCT: 29.5 % — ABNORMAL LOW (ref 36.0–46.0)
Hemoglobin: 9 g/dL — ABNORMAL LOW (ref 12.0–15.0)
MCH: 28.7 pg (ref 26.0–34.0)
MCHC: 30.5 g/dL (ref 30.0–36.0)
MCV: 93.9 fL (ref 78.0–100.0)
Platelets: 198 10*3/uL (ref 150–400)
RBC: 3.14 MIL/uL — ABNORMAL LOW (ref 3.87–5.11)
RDW: 14.9 % (ref 11.5–15.5)
WBC: 4.9 10*3/uL (ref 4.0–10.5)

## 2016-08-30 LAB — BASIC METABOLIC PANEL
Anion gap: 10 (ref 5–15)
BUN: 78 mg/dL — ABNORMAL HIGH (ref 6–20)
CO2: 28 mmol/L (ref 22–32)
Calcium: 9.3 mg/dL (ref 8.9–10.3)
Chloride: 102 mmol/L (ref 101–111)
Creatinine, Ser: 3.16 mg/dL — ABNORMAL HIGH (ref 0.44–1.00)
GFR calc Af Amer: 15 mL/min — ABNORMAL LOW (ref >60)
GFR calc non Af Amer: 13 mL/min — ABNORMAL LOW (ref >60)
Glucose, Bld: 110 mg/dL — ABNORMAL HIGH (ref 65–99)
Potassium: 3.2 mmol/L — ABNORMAL LOW (ref 3.5–5.1)
Sodium: 140 mmol/L (ref 135–145)

## 2016-08-30 LAB — I-STAT TROPONIN, ED: Troponin i, poc: 0.07 ng/mL (ref 0.00–0.08)

## 2016-08-30 NOTE — ED Triage Notes (Addendum)
Pt reports right sided chest pain, described as an ache, that started this morning. She states she feels it more when she takes a deep breath. Denies SOB.

## 2016-08-31 ENCOUNTER — Emergency Department (HOSPITAL_COMMUNITY)
Admission: EM | Admit: 2016-08-31 | Discharge: 2016-08-31 | Disposition: A | Discharge status: Home / Self Care | Payer: Medicare Other | Attending: Emergency Medicine | Admitting: Emergency Medicine

## 2016-08-31 DIAGNOSIS — R0789 Other chest pain: Secondary | ICD-10-CM

## 2016-08-31 LAB — I-STAT TROPONIN, ED: Troponin i, poc: 0.06 ng/mL (ref 0.00–0.08)

## 2016-08-31 MED ORDER — TRAMADOL HCL 50 MG PO TABS: 50.0000 mg | ORAL_TABLET | Freq: Four times a day (QID) | ORAL | 0 refills | Status: DC | PRN

## 2016-08-31 MED ORDER — OXYCODONE-ACETAMINOPHEN 5-325 MG PO TABS
0.5000 | ORAL_TABLET | Freq: Once | ORAL | Status: AC
  Administered 2016-08-31: 0.5 via ORAL
  Filled 2016-08-31: qty 1

## 2016-08-31 NOTE — ED Notes (Signed)
Pt departed in NAD.  

## 2016-08-31 NOTE — ED Triage Notes (Signed)
pts family asking for the wait time  Given  pts family not upset

## 2016-08-31 NOTE — ED Provider Notes (Signed)
[facility] DEPT Provider Note   CSN: [id number] Arrival date & time: 08/30/16  1659  By signing my name below, I, [name], attest that this documentation has been prepared under the direction and in the presence of Bryssa Tones, Gwenyth Allegra, *. Electronically Signed: Lise Auer, ED Scribe. 08/31/16. 1:48 AM.   History   Chief Complaint Chief Complaint  Patient presents with  . Chest Pain   HPI  HPI Comments: Doris Howell is a 81 y.o. female with a PMHx of CAP, CHF, GERD, HLD, HTN, and DM II who presents to the Emergency Department with a chief complaint of sudden onset, gradually worsening sharp right sided chest pain underneath her breast that started this morning. Her pain is intensified with positional movement and deep breaths. She notes her pain is completely resolved while lying still. No recent falls. No treatment tried PTA. Denies cough, shortness of breath, hemopytsis, or peripherial edema.    Past Medical History:  Diagnosis Date  . CAP (community acquired pneumonia) 01/09/2014   "first time I've ever had it"  . Diastolic congestive heart failure ([place])   . GERD (gastroesophageal reflux disease)   . High cholesterol   . History of gout   . Hypertension   . Kidney stones   . Type II diabetes mellitus with stage 4 chronic kidney disease [facility])     Patient Active Problem List   Diagnosis Date Noted  . Left bundle branch block 12/22/2014  . Renal failure, acute on chronic (HCC)   . Pleural effusion on right   . Lung infiltrate   . Acute respiratory failure, unspecified whether with hypoxia or hypercapnia ([place])   . CHF exacerbation ([place])   . Acute on chronic renal failure ([place])   . Acute exacerbation of CHF (congestive heart failure) ([place]) 08/28/2014  . Acute diastolic CHF (congestive heart failure) ([place]) 08/28/2014  . Chronic kidney disease (CKD), stage IV (severe) ([place])   . Acute on chronic combined systolic and diastolic CHF (congestive heart failure)  ([place])   . Sleep apnea 07/29/2014  . Obesity (BMI 37) 04/14/2014  . Bradycardia- beta blocker decreased 04/14/2014  . Pulmonary infiltrates   . Diastolic CHF, acute on chronic (HCC) 04/11/2014  . Type 2 diabetes mellitus with stage 4 chronic kidney disease ([place]) 04/11/2014  . Essential hypertension 04/11/2014  . Gout 04/11/2014  . HCAP (healthcare-associated pneumonia)   . Hyperkalemia 02/08/2014  . Chronic diastolic heart failure ([place]) 02/05/2014  . CKD (chronic kidney disease), stage IV ([place]) 02/05/2014  . CAP (community acquired pneumonia) 01/09/2014  . Acute respiratory failure with hypoxia ([place]) 01/09/2014  . Anemia 01/09/2014  . AKI (acute kidney injury) ([place]) 01/09/2014    Past Surgical History:  Procedure Laterality Date  . ABDOMINAL HYSTERECTOMY  ~ 1974  . CATARACT EXTRACTION W/ INTRAOCULAR LENS  IMPLANT, BILATERAL Bilateral 2014  . COLONOSCOPY WITH PROPOFOL N/A 10/23/2012   Procedure: COLONOSCOPY WITH PROPOFOL;  Surgeon: [name], MD;  Location: [facility] ENDOSCOPY;  Service: Endoscopy;  Laterality: N/A;  . KIDNEY STONE SURGERY  10-05-12   "cut me on the side"  . LITHOTRIPSY  1980's?  . RIGHT HEART CATHETERIZATION N/A 04/15/2014   Procedure: RIGHT HEART CATH;  Surgeon: Sanda Klein, MD;  Location: [facility] CATH LAB;  Service: Cardiovascular;  Laterality: N/A;    OB History    No data available       Home Medications    Prior to Admission medications   Medication Sig Start Date End Date Taking? Authorizing  Provider  albuterol (PROVENTIL HFA;VENTOLIN HFA) 108 (90 BASE) MCG/ACT inhaler Inhale 2 puffs into the lungs every 6 (six) hours as needed for wheezing or shortness of breath. 01/13/14  Yes [name], MD  allopurinol (ZYLOPRIM) 100 MG tablet Take 100 mg by mouth 2 (two) times daily.   Yes [provider]  aspirin EC 81 MG tablet Take 81 mg by mouth daily.   Yes [provider]  carvedilol (COREG) 12.5 MG tablet Take 12.5 mg by mouth 2 (two) times  daily with a meal.   Yes [provider]  hydrALAZINE (APRESOLINE) 25 MG tablet Take 25 mg by mouth 2 (two) times daily.    Yes [provider]  latanoprost (XALATAN) 0.005 % ophthalmic solution Place 1 drop into both eyes at bedtime.   Yes [provider]  torsemide (DEMADEX) 20 MG tablet Take 1-2 tablets (20-40 mg total) by mouth 2 (two) times daily. Take 2 tabs daily at 8 AM and 1 tab daily after lunch at 1 PM. Patient taking differently: Take 20-40 mg by mouth See admin instructions. Take 2 tabs daily at 8 AM and 1 tab daily after lunch at 1 PM. 09/11/14  Yes Hongalgi, Lenis Dickinson, MD  Vitamin D, Cholecalciferol, 400 UNITS CAPS Take 2 capsules by mouth daily.   Yes [provider]  glimepiride (AMARYL) 1 MG tablet Take 0.5 tablets (0.5 mg total) by mouth daily. Patient not taking: Reported on 05/03/2016 09/11/14   Modena Jansky, MD  traMADol (ULTRAM) 50 MG tablet Take 1 tablet (50 mg total) by mouth every 6 (six) hours as needed. 08/31/16   Orpah Greek, MD    Family History Family History  Problem Relation Age of Onset  . Hypertension Mother   . Diabetes Brother   . CAD Neg Hx     Social History Social History  Substance Use Topics  . Smoking status: Never Smoker  . Smokeless tobacco: Never Used  . Alcohol use No     Allergies   Simvastatin and Codeine   Review of Systems Review of Systems  Respiratory: Negative for cough and shortness of breath.        Denies hemoptysis.   Cardiovascular: Positive for chest pain. Negative for leg swelling.  All other systems reviewed and are negative.    Physical Exam Updated Vital Signs BP (!) 163/73 (BP Location: Left Arm)   Pulse 73   Temp 98.1 F (36.7 C) (Oral)   Resp 18   SpO2 98%   Physical Exam  Constitutional: She is oriented to person, place, and time. She appears well-developed and well-nourished. No distress.  HENT:  Head: Normocephalic and atraumatic.  Right Ear: Hearing  normal.  Left Ear: Hearing normal.  Nose: Nose normal.  Mouth/Throat: Oropharynx is clear and moist and mucous membranes are normal.  Eyes: Conjunctivae and EOM are normal. Pupils are equal, round, and reactive to light.  Neck: Normal range of motion. Neck supple.  Cardiovascular: Regular rhythm, S1 normal and S2 normal.  Exam reveals no gallop and no friction rub.   No murmur heard. Pulmonary/Chest: Effort normal and breath sounds normal. No respiratory distress. She exhibits tenderness.  Right anterior and lateral chest wall tenderness. No crepitus.   Abdominal: Soft. Normal appearance and bowel sounds are normal. There is no hepatosplenomegaly. There is no tenderness. There is no rebound, no guarding, no tenderness at McBurney's point and negative Murphy's sign. No hernia.  Musculoskeletal: Normal range of motion.  Neurological: She  is alert and oriented to person, place, and time. She has normal strength. No cranial nerve deficit or sensory deficit. Coordination normal. GCS eye subscore is 4. GCS verbal subscore is 5. GCS motor subscore is 6.  Skin: Skin is warm, dry and intact. No rash noted. No cyanosis.  Psychiatric: She has a normal mood and affect. Her speech is normal and behavior is normal. Thought content normal.  Nursing note and vitals reviewed.    ED Treatments / Results  DIAGNOSTIC STUDIES: Oxygen Saturation is 98% on RA, nor by my interpretation.   COORDINATION OF CARE: 1:25 AM-Discussed next steps with pt. Pt verbalized understanding and is agreeable with the plan.   Labs (all labs ordered are listed, but only abnormal results are displayed) Labs Reviewed  BASIC METABOLIC PANEL - Abnormal; Notable for the following:       Result Value   Potassium 3.2 (*)    Glucose, Bld 110 (*)    BUN 78 (*)    Creatinine, Ser 3.16 (*)    GFR calc non Af Amer 13 (*)    GFR calc Af Amer 15 (*)    All other components within normal limits  CBC - Abnormal; Notable for the  following:    RBC 3.14 (*)    Hemoglobin 9.0 (*)    HCT 29.5 (*)    All other components within normal limits  I-STAT TROPOININ, ED  I-STAT TROPOININ, ED    EKG  EKG Interpretation  Date/Time:  Tuesday Aug 30 2016 17:05:08 EDT Ventricular Rate:  66 PR Interval:  162 QRS Duration: 152 QT Interval:  488 QTC Calculation: 511 R Axis:   85 Text Interpretation:  Sinus rhythm with occasional Premature ventricular complexes Left bundle branch block Abnormal ECG Confirmed by Orpah Greek [phone]) on 08/31/2016 12:59:54 AM       Radiology Dg Chest 2 View  Result Date: 08/30/2016 CLINICAL DATA:  RIGHT chest pain today. EXAM: CHEST  2 VIEW COMPARISON:  Chest radiograph September 11, 2014 FINDINGS: The cardiac silhouette is moderately enlarged and unchanged. Calcified aortic knob. Pulmonary vascular congestion without pleural effusion or focal consolidation. Strandy densities LEFT mid and lower lung zone. No pneumothorax. Soft tissue planes and included osseous structures are nonsuspicious. Faint vascular calcifications in the neck. Surgical clips project in LEFT retroperitoneal space. Mild degenerative change of the thoracic spine. IMPRESSION: Stable cardiomegaly and pulmonary vascular congestion. LEFT atelectasis/scarring. Electronically Signed   By: Elon Alas M.D.   On: 08/30/2016 17:44    Procedures Procedures (including critical care time)  Medications Ordered in ED Medications  oxyCODONE-acetaminophen (PERCOCET/ROXICET) 5-325 MG per tablet 0.5 tablet (not administered)     Initial Impression / Assessment and Plan / ED Course  I have reviewed the triage vital signs and the nursing notes.  Pertinent labs & imaging results that were available during my care of the patient were reviewed by me and considered in my medical decision making (see chart for details).     Patient [facility] for evaluation of chest pain. Patient reports awakening with right-sided  chest pain. Pain is under the area of the breast. She denies injury. She has very reproducible chest pain with both palpation as well as movement of her torso. Pain is positional, has absolutely no pain when lying flat, starts the pain when sitting up. Cardiac evaluation including troponin 2 is negative. Symptoms do not support diagnosis of cardiac chest pain. She has no tachycardia, hypoxia, shortness of breath. There  is no leg swelling. She has not had hemoptysis. Nothing to indicate PE or congestive heart failure. Patient treated with analgesia, will continue analgesia and follow-up with primary doctor.  Final Clinical Impressions(s) / ED Diagnoses   Final diagnoses:  Chest wall pain    New Prescriptions New Prescriptions   TRAMADOL (ULTRAM) 50 MG TABLET    Take 1 tablet (50 mg total) by mouth every 6 (six) hours as needed.   I personally performed the services described in this documentation, which was scribed in my presence. The recorded information has been reviewed and is accurate.     Orpah Greek, MD 08/31/16 [phone]

## 2016-09-01 ENCOUNTER — Telehealth: Payer: Self-pay | Admitting: Cardiovascular Disease

## 2016-09-01 NOTE — Telephone Encounter (Signed)
New Message  Megan from [facility] case manager voiced for [place] to give patient a call.  Jinny Blossom wanting to speak with nurse but nurse is off until 60.  Please f/u

## 2016-09-01 NOTE — Telephone Encounter (Signed)
Pt reports "an attack on Tuesday" and "went to the emergency room".  "I woke up this morning sick on my stomach b/c I am taking Tramadol". She tells me that her health care case manager suggested she needs to f/u w/ her cardiologist.   Informed pt that per ED report the pain she was experiencing not cardiac related.  Informed pt that we will call her back if Dr. Acie Fredrickson feels she needs to be seen.  She understands we will only call her back if she needs to be seen.  Otherwise we will see her at regular follow up.  Pt is agreeable to plan.

## 2016-09-02 ENCOUNTER — Telehealth: Payer: Self-pay | Admitting: Cardiovascular Disease

## 2016-09-02 NOTE — Telephone Encounter (Signed)
F/u Message  [facility] case Manage returning RN call. Case manage would like to know  The information that was relied to the pt on yesterday by Trinidad Curet. Pt does not recall the entire conversation. Please call back to discuss

## 2016-09-02 NOTE — Telephone Encounter (Signed)
I cannot verbally release patient information over the phone.

## 2016-09-02 NOTE — Telephone Encounter (Signed)
lmtcb

## 2016-09-02 NOTE — Telephone Encounter (Signed)
Called patient to inform her that Dr. Acie Fredrickson did review hospital notes and determined that she can keep follow up later this summer.  Pt verbalized understanding.

## 2016-09-02 NOTE — Telephone Encounter (Signed)
°  Follow Up   Calling back returning call from earlier. Please call.

## 2016-10-06 ENCOUNTER — Other Ambulatory Visit: Payer: Self-pay | Admitting: Gastroenterology

## 2016-10-06 DIAGNOSIS — K862 Cyst of pancreas: Secondary | ICD-10-CM

## 2016-10-13 ENCOUNTER — Encounter: Payer: Self-pay | Admitting: Cardiovascular Disease

## 2016-10-13 ENCOUNTER — Encounter (INDEPENDENT_AMBULATORY_CARE_PROVIDER_SITE_OTHER): Payer: Self-pay

## 2016-10-13 ENCOUNTER — Ambulatory Visit (INDEPENDENT_AMBULATORY_CARE_PROVIDER_SITE_OTHER): Payer: Medicare Other | Admitting: Cardiovascular Disease

## 2016-10-13 VITALS — BP 130/70 | HR 64 | Ht 61.0 in | Wt 174.0 lb

## 2016-10-13 DIAGNOSIS — I5032 Chronic diastolic (congestive) heart failure: Secondary | ICD-10-CM

## 2016-10-13 DIAGNOSIS — Z79899 Other long term (current) drug therapy: Secondary | ICD-10-CM | POA: Diagnosis not present

## 2016-10-13 DIAGNOSIS — I5033 Acute on chronic diastolic (congestive) heart failure: Secondary | ICD-10-CM | POA: Diagnosis not present

## 2016-10-13 DIAGNOSIS — I272 Pulmonary hypertension, unspecified: Secondary | ICD-10-CM | POA: Diagnosis not present

## 2016-10-13 NOTE — Patient Instructions (Signed)
Medication Instructions:    Your physician recommends that you continue on your current medications as directed. Please refer to the Current Medication list given to you today.  - If you need a refill on your cardiac medications before your next appointment, please call your pharmacy.   Labwork:  Your physician recommends that you return for lab work in: 6 months for BMET (prior to seeing Dr. Acie Fredrickson)   Testing/Procedures:  None ordered  Follow-Up:  Your physician wants you to follow-up in: 6 months with Dr. Acie Fredrickson.  You will receive a reminder letter in the mail two months in advance. If you don't receive a letter, please call our office to schedule the follow-up appointment.  Thank you for choosing [facility]!!

## 2016-10-13 NOTE — Progress Notes (Signed)
Cardiology Office Note   Date:  10/13/2016   ID:  Doris Howell, Doris Howell 01/25/1936, MRN [id number]  PCP:  Lavone Orn, MD  Cardiologist: Darlin Coco MD -- > now [place]  Chief Complaint  Patient presents with  . Chest Pain  . Congestive Heart Failure   Problem list 1. Moderate to severe pulmonary hypertension - with right heart failure 2. Chronic kidney disease 3. Chronic combined  systolic and diastolic congestive heart failure  Previous Notes from Dr. Mare Ferrari:  Doris Howell is a 81 y.o. female who presents for a scheduled follow-up visit.  Doris Howell is a 81 y.o. female who presents for follow-up after recent hospitalization in April 2016 for exacerbation of chronic diastolic heart failure. During an earlier admission in January 2016 the patient had a right heart catheterization on 04/15/14 and she was found to have a pulmonary artery pressure of 64/25 with a mean of 40. The right ventricular end-diastolic pressure was 15. The impression was biventricular heart failure with preserved left ventricular ejection fraction and moderate pulmonary artery hypertension. The patient responded well to diuresis. Since being discharged her breathing has improved. Her weight has been stable at home. She sleeps on 2 pillows and the head of her bed is elevated on bricks. She is not having any paroxysmal nocturnal dyspnea. The patient has severe kidney disease and left heart catheterization was avoided because of her renal insufficiency. The patient had an echocardiogram on 07/31/14 which showed an ejection fraction of 45-50% with grade 2 diastolic dysfunction and mild mitral regurgitation. The patient has a history of diabetes. She denies any hypoglycemic episodes. Since last visit she has been doing reasonably well. She had a Lexiscan Myoview on 05/05/14 which showed left ventricular systolic dysfunction with ejection fraction 44%. There was no ischemia. The  patient has not been having any chest pain. She has a history of sleep apnea.She now has a CPAP machine which is new since last visit The patient has not been having any symptoms of gout. She is not having any hypoglycemic episodes. She has been experiencing nocturia 2-3 times a night. She has been having some intermittent right upper quadrant discomfort which sounds like a muscular cramp.  It does not have the characteristics of gallbladder colic.   [name] 23, 2018:  Doris Howell is seen for the first time today - transfer from [place] Has had more shortness of breath this past weekend.    Does not eat salt.  Breathing is better.   October 13, 2016:   Doris Howell is seen today for follow up of her biventricular CHF. And a recent visit to the ER. She was in the ER Aug 31, 2016 with atypical CP . Troponin levels were mildly elevated.   0.07, 0.06 No further CP ,  No issues.  Creatinine is now 3.16.   Has had CKD for years.  BP is typically elevated at doctors office  No CP or dyspnea   Past Medical History:  Diagnosis Date  . CAP (community acquired pneumonia) 01/09/2014   "first time I've ever had it"  . Diastolic congestive heart failure ([place])   . GERD (gastroesophageal reflux disease)   . High cholesterol   . History of gout   . Hypertension   . Kidney stones   . Type II diabetes mellitus with stage 4 chronic kidney disease (HCC)     Past Surgical History:  Procedure Laterality Date  . ABDOMINAL HYSTERECTOMY  ~ 1974  . CATARACT  EXTRACTION W/ INTRAOCULAR LENS  IMPLANT, BILATERAL Bilateral 2014  . COLONOSCOPY WITH PROPOFOL N/A 10/23/2012   Procedure: COLONOSCOPY WITH PROPOFOL;  Surgeon: [name], MD;  Location: [facility] ENDOSCOPY;  Service: Endoscopy;  Laterality: N/A;  . KIDNEY STONE SURGERY  10-05-12   "cut me on the side"  . LITHOTRIPSY  1980's?  . RIGHT HEART CATHETERIZATION N/A 04/15/2014   Procedure: RIGHT HEART CATH;  Surgeon: Sanda Klein, MD;  Location: [place] CATH  LAB;  Service: Cardiovascular;  Laterality: N/A;     Current Outpatient Prescriptions  Medication Sig Dispense Refill  . albuterol (PROVENTIL HFA;VENTOLIN HFA) 108 (90 BASE) MCG/ACT inhaler Inhale 2 puffs into the lungs every 6 (six) hours as needed for wheezing or shortness of breath. 1 Inhaler 0  . allopurinol (ZYLOPRIM) 100 MG tablet Take 100 mg by mouth 2 (two) times daily.    [name] aspirin EC 81 MG tablet Take 81 mg by mouth daily.    . carvedilol (COREG) 12.5 MG tablet Take 12.5 mg by mouth 2 (two) times daily with a meal.    . hydrALAZINE (APRESOLINE) 25 MG tablet Take 25 mg by mouth 2 (two) times daily.     [name] latanoprost (XALATAN) 0.005 % ophthalmic solution Place 1 drop into both eyes at bedtime.    . torsemide (DEMADEX) 20 MG tablet Take 1-2 tablets (20-40 mg total) by mouth 2 (two) times daily. Take 2 tabs daily at 8 AM and 1 tab daily after lunch at 1 PM. (Patient taking differently: Take 20-40 mg by mouth See admin instructions. Take 2 tabs daily at 8 AM and 1 tab daily after lunch at 1 PM.) 90 tablet 0  . Vitamin D, Cholecalciferol, 400 UNITS CAPS Take 2 capsules by mouth daily.     No current facility-administered medications for this visit.     Allergies:   Simvastatin and Codeine    Social History:  The patient  reports that she has never smoked. She has never used smokeless tobacco. She reports that she does not drink alcohol or use drugs.   Family History:  The patient's family history includes Diabetes in her brother; Hypertension in her mother.    ROS:  Please see the history of present illness.   Otherwise, review of systems are positive for none.   All other systems are reviewed and negative.    PHYSICAL EXAM: VS:  BP 130/70   Pulse 64   Ht 5\' 1"  (1.549 m)   Wt 174 lb (78.9 kg)   SpO2 99%   BMI 32.88 kg/m  , BMI Body mass index is 32.88 kg/m. GEN: Well nourished, well developed, in no acute distress  HEENT: normal  Neck: no JVD, carotid bruits, or  masses Cardiac: RRR  With frequent PVCs ; no murmurs, rubs, or gallops,no edema  Respiratory:  clear to auscultation bilaterally, normal work of breathing GI: soft, nontender, nondistended, + BS MS: no deformity or atrophy  Skin: warm and dry, no rash Neuro:  Strength and sensation are intact Psych: euthymic mood, full affect   EKG:  EKG is ordered today. The ekg ordered today demonstrates normal sinus rhythm with  Frequent PVCs.  Left bundle branch block.  Since prior tracing of 08/29/14, no significant change   Recent Labs: 08/30/2016: BUN 78; Creatinine, Ser 3.16; Hemoglobin 9.0; Platelets 198; Potassium 3.2; Sodium 140    Lipid Panel No results found for: CHOL, TRIG, HDL, CHOLHDL, VLDL, LDLCALC, LDLDIRECT    Wt Readings from Last 3 Encounters:  10/13/16 174 lb (78.9 kg)  05/03/16 181 lb 1.9 oz (82.2 kg)  12/22/14 185 lb (83.9 kg)        ASSESSMENT AND PLAN:  1.  Biventricular congestive heart failure, improved. Ejection fraction 50% by echo. Ejection fraction 44% by Lexiscan Myoview on 05/05/14. No reversible ischemia Overall seems to be doing well. Occasionally eats some salty foods. She has rare episodes of dyspnea.  Advised her to take an extra dose of Torsemide if she needs it for shortness of breath   2. Renal insufficiency stage 3 - will check BMP  In 6 months   3. Anemia of chronic disease 4.  Left bundle branch block, chronic   Current medicines are reviewed at length with the patient today.  The patient does not have concerns regarding medicines.  The following changes have been made:  no change  Labs/ tests ordered today include:   No orders of the defined types were placed in this encounter.

## 2016-10-26 ENCOUNTER — Ambulatory Visit
Admission: RE | Admit: 2016-10-26 | Discharge: 2016-10-26 | Disposition: A | Discharge status: Home / Self Care | Payer: Medicare Other | Source: Ambulatory Visit | Attending: Gastroenterology | Admitting: Gastroenterology

## 2016-10-26 ENCOUNTER — Other Ambulatory Visit: Payer: Self-pay | Admitting: Gastroenterology

## 2016-10-26 DIAGNOSIS — K862 Cyst of pancreas: Secondary | ICD-10-CM

## 2016-12-06 NOTE — Discharge Instructions (Signed)
Epoetin Alfa injection °What is this medicine? °EPOETIN ALFA (e POE e tin AL fa) helps your body make more red blood cells. This medicine is used to treat anemia caused by chronic kidney failure, cancer chemotherapy, or HIV-therapy. It may also be used before surgery if you have anemia. °This medicine may be used for other purposes; ask your health care provider or pharmacist if you have questions. °COMMON BRAND NAME(S): Epogen, Procrit °What should I tell my health care provider before I take this medicine? °They need to know if you have any of these conditions: °-blood clotting disorders °-cancer patient not on chemotherapy °-cystic fibrosis °-heart disease, such as angina or heart failure °-hemoglobin level of 12 g/dL or greater °-high blood pressure °-low levels of folate, iron, or vitamin B12 °-seizures °-an unusual or allergic reaction to erythropoietin, albumin, benzyl alcohol, hamster proteins, other medicines, foods, dyes, or preservatives °-pregnant or trying to get pregnant °-breast-feeding °How should I use this medicine? °This medicine is for injection into a vein or under the skin. It is usually given by a health care professional in a hospital or clinic setting. °If you get this medicine at home, you will be taught how to prepare and give this medicine. Use exactly as directed. Take your medicine at regular intervals. Do not take your medicine more often than directed. °It is important that you put your used needles and syringes in a special sharps container. Do not put them in a trash can. If you do not have a sharps container, call your pharmacist or healthcare provider to get one. °A special MedGuide will be given to you by the pharmacist with each prescription and refill. Be sure to read this information carefully each time. °Talk to your pediatrician regarding the use of this medicine in children. While this drug may be prescribed for selected conditions, precautions do apply. °Overdosage: If you  think you have taken too much of this medicine contact a poison control center or emergency room at once. °NOTE: This medicine is only for you. Do not share this medicine with others. °What if I miss a dose? °If you miss a dose, take it as soon as you can. If it is almost time for your next dose, take only that dose. Do not take double or extra doses. °What may interact with this medicine? °Do not take this medicine with any of the following medications: °-darbepoetin alfa °This list may not describe all possible interactions. Give your health care provider a list of all the medicines, herbs, non-prescription drugs, or dietary supplements you use. Also tell them if you smoke, drink alcohol, or use illegal drugs. Some items may interact with your medicine. °What should I watch for while using this medicine? °Your condition will be monitored carefully while you are receiving this medicine. °You may need blood work done while you are taking this medicine. °What side effects may I notice from receiving this medicine? °Side effects that you should report to your doctor or health care professional as soon as possible: °-allergic reactions like skin rash, itching or hives, swelling of the face, lips, or tongue °-breathing problems °-changes in vision °-chest pain °-confusion, trouble speaking or understanding °-feeling faint or lightheaded, falls °-high blood pressure °-muscle aches or pains °-pain, swelling, warmth in the leg °-rapid weight gain °-severe headaches °-sudden numbness or weakness of the face, arm or leg °-trouble walking, dizziness, loss of balance or coordination °-seizures (convulsions) °-swelling of the ankles, feet, hands °-unusually weak or tired °  Side effects that usually do not require medical attention (report to your doctor or health care professional if they continue or are bothersome): °-diarrhea °-fever, chills (flu-like symptoms) °-headaches °-nausea, vomiting °-redness, stinging, or swelling at  site where injected °This list may not describe all possible side effects. Call your doctor for medical advice about side effects. You may report side effects to FDA at 1-800-FDA-1088. °Where should I keep my medicine? °Keep out of the reach of children. °Store in a refrigerator between 2 and 8 degrees C (36 and 46 degrees F). Do not freeze or shake. Throw away any unused portion if using a single-dose vial. Multi-dose vials can be kept in the refrigerator for up to 21 days after the initial dose. Throw away unused medicine. °NOTE: This sheet is a summary. It may not cover all possible information. If you have questions about this medicine, talk to your doctor, pharmacist, or health care provider. °© 2018 [employer]vier/Gold Standard (2015-11-16 19:42:31) ° °

## 2016-12-07 ENCOUNTER — Encounter (HOSPITAL_COMMUNITY)
Admission: RE | Admit: 2016-12-07 | Discharge: 2016-12-07 | Disposition: A | Discharge status: Home / Self Care | Payer: Medicare Other | Source: Ambulatory Visit | Attending: Nephrology | Admitting: Nephrology

## 2016-12-07 DIAGNOSIS — D631 Anemia in chronic kidney disease: Secondary | ICD-10-CM | POA: Insufficient documentation

## 2016-12-07 DIAGNOSIS — N184 Chronic kidney disease, stage 4 (severe): Secondary | ICD-10-CM | POA: Insufficient documentation

## 2016-12-07 MED ORDER — EPOETIN ALFA 10000 UNIT/ML IJ SOLN
10000.0000 [IU] | INTRAMUSCULAR | Status: DC
  Administered 2016-12-07: 10000 [IU] via SUBCUTANEOUS

## 2016-12-07 MED ORDER — EPOETIN ALFA 10000 UNIT/ML IJ SOLN
INTRAMUSCULAR | Status: AC
  Administered 2016-12-07: 10000 [IU] via SUBCUTANEOUS
  Filled 2016-12-07: qty 1

## 2016-12-08 LAB — POCT HEMOGLOBIN-HEMACUE: Hemoglobin: 9.1 g/dL — ABNORMAL LOW (ref 12.0–15.0)

## 2016-12-21 ENCOUNTER — Encounter (HOSPITAL_COMMUNITY)
Admission: RE | Admit: 2016-12-21 | Discharge: 2016-12-21 | Disposition: A | Discharge status: Home / Self Care | Payer: Medicare Other | Source: Ambulatory Visit | Attending: Nephrology | Admitting: Nephrology

## 2016-12-21 DIAGNOSIS — D631 Anemia in chronic kidney disease: Secondary | ICD-10-CM | POA: Diagnosis present

## 2016-12-21 DIAGNOSIS — N184 Chronic kidney disease, stage 4 (severe): Secondary | ICD-10-CM | POA: Diagnosis present

## 2016-12-21 LAB — POCT HEMOGLOBIN-HEMACUE: Hemoglobin: 8.8 g/dL — ABNORMAL LOW (ref 12.0–15.0)

## 2016-12-21 MED ORDER — EPOETIN ALFA 10000 UNIT/ML IJ SOLN
INTRAMUSCULAR | Status: AC
  Filled 2016-12-21: qty 1

## 2016-12-21 MED ORDER — EPOETIN ALFA 10000 UNIT/ML IJ SOLN
10000.0000 [IU] | INTRAMUSCULAR | Status: DC
  Administered 2016-12-21: 10000 [IU] via SUBCUTANEOUS

## 2017-01-04 ENCOUNTER — Encounter (HOSPITAL_COMMUNITY)
Admission: RE | Admit: 2017-01-04 | Discharge: 2017-01-04 | Disposition: A | Discharge status: Home / Self Care | Payer: Medicare Other | Source: Ambulatory Visit | Attending: Nephrology | Admitting: Nephrology

## 2017-01-04 DIAGNOSIS — N184 Chronic kidney disease, stage 4 (severe): Secondary | ICD-10-CM | POA: Diagnosis not present

## 2017-01-04 LAB — RENAL FUNCTION PANEL
Albumin: 3.9 g/dL (ref 3.5–5.0)
Anion gap: 11 (ref 5–15)
BUN: 66 mg/dL — ABNORMAL HIGH (ref 6–20)
CO2: 27 mmol/L (ref 22–32)
Calcium: 9.2 mg/dL (ref 8.9–10.3)
Chloride: 102 mmol/L (ref 101–111)
Creatinine, Ser: 3.44 mg/dL — ABNORMAL HIGH (ref 0.44–1.00)
GFR calc Af Amer: 14 mL/min — ABNORMAL LOW (ref >60)
GFR calc non Af Amer: 12 mL/min — ABNORMAL LOW (ref >60)
Glucose, Bld: 140 mg/dL — ABNORMAL HIGH (ref 65–99)
Phosphorus: 5.2 mg/dL — ABNORMAL HIGH (ref 2.5–4.6)
Potassium: 3.4 mmol/L — ABNORMAL LOW (ref 3.5–5.1)
Sodium: 140 mmol/L (ref 135–145)

## 2017-01-04 LAB — CBC
HCT: 31.1 % — ABNORMAL LOW (ref 36.0–46.0)
Hemoglobin: 9.6 g/dL — ABNORMAL LOW (ref 12.0–15.0)
MCH: 29.4 pg (ref 26.0–34.0)
MCHC: 30.9 g/dL (ref 30.0–36.0)
MCV: 95.1 fL (ref 78.0–100.0)
Platelets: 200 10*3/uL (ref 150–400)
RBC: 3.27 MIL/uL — ABNORMAL LOW (ref 3.87–5.11)
RDW: 14.3 % (ref 11.5–15.5)
WBC: 5 10*3/uL (ref 4.0–10.5)

## 2017-01-04 LAB — IRON AND TIBC
Iron: 69 ug/dL (ref 28–170)
Saturation Ratios: 24 % (ref 10.4–31.8)
TIBC: 287 ug/dL (ref 250–450)
UIBC: 218 ug/dL

## 2017-01-04 LAB — FERRITIN: Ferritin: 141 ng/mL (ref 11–307)

## 2017-01-04 LAB — MAGNESIUM: Magnesium: 2.3 mg/dL (ref 1.7–2.4)

## 2017-01-04 MED ORDER — EPOETIN ALFA 10000 UNIT/ML IJ SOLN
10000.0000 [IU] | INTRAMUSCULAR | Status: DC
  Administered 2017-01-04: 10000 [IU] via SUBCUTANEOUS

## 2017-01-04 MED ORDER — EPOETIN ALFA 10000 UNIT/ML IJ SOLN
INTRAMUSCULAR | Status: AC
  Administered 2017-01-04: 10000 [IU] via SUBCUTANEOUS
  Filled 2017-01-04: qty 1

## 2017-01-04 NOTE — Discharge Instructions (Signed)
Epoetin Alfa injection °What is this medicine? °EPOETIN ALFA (e POE e tin AL fa) helps your body make more red blood cells. This medicine is used to treat anemia caused by chronic kidney failure, cancer chemotherapy, or HIV-therapy. It may also be used before surgery if you have anemia. °This medicine may be used for other purposes; ask your health care provider or pharmacist if you have questions. °COMMON BRAND NAME(S): Epogen, Procrit °What should I tell my health care provider before I take this medicine? °They need to know if you have any of these conditions: °-blood clotting disorders °-cancer patient not on chemotherapy °-cystic fibrosis °-heart disease, such as angina or heart failure °-hemoglobin level of 12 g/dL or greater °-high blood pressure °-low levels of folate, iron, or vitamin B12 °-seizures °-an unusual or allergic reaction to erythropoietin, albumin, benzyl alcohol, hamster proteins, other medicines, foods, dyes, or preservatives °-pregnant or trying to get pregnant °-breast-feeding °How should I use this medicine? °This medicine is for injection into a vein or under the skin. It is usually given by a health care professional in a hospital or clinic setting. °If you get this medicine at home, you will be taught how to prepare and give this medicine. Use exactly as directed. Take your medicine at regular intervals. Do not take your medicine more often than directed. °It is important that you put your used needles and syringes in a special sharps container. Do not put them in a trash can. If you do not have a sharps container, call your pharmacist or healthcare provider to get one. °A special MedGuide will be given to you by the pharmacist with each prescription and refill. Be sure to read this information carefully each time. °Talk to your pediatrician regarding the use of this medicine in children. While this drug may be prescribed for selected conditions, precautions do apply. °Overdosage: If you  think you have taken too much of this medicine contact a poison control center or emergency room at once. °NOTE: This medicine is only for you. Do not share this medicine with others. °What if I miss a dose? °If you miss a dose, take it as soon as you can. If it is almost time for your next dose, take only that dose. Do not take double or extra doses. °What may interact with this medicine? °Do not take this medicine with any of the following medications: °-darbepoetin alfa °This list may not describe all possible interactions. Give your health care provider a list of all the medicines, herbs, non-prescription drugs, or dietary supplements you use. Also tell them if you smoke, drink alcohol, or use illegal drugs. Some items may interact with your medicine. °What should I watch for while using this medicine? °Your condition will be monitored carefully while you are receiving this medicine. °You may need blood work done while you are taking this medicine. °What side effects may I notice from receiving this medicine? °Side effects that you should report to your doctor or health care professional as soon as possible: °-allergic reactions like skin rash, itching or hives, swelling of the face, lips, or tongue °-breathing problems °-changes in vision °-chest pain °-confusion, trouble speaking or understanding °-feeling faint or lightheaded, falls °-high blood pressure °-muscle aches or pains °-pain, swelling, warmth in the leg °-rapid weight gain °-severe headaches °-sudden numbness or weakness of the face, arm or leg °-trouble walking, dizziness, loss of balance or coordination °-seizures (convulsions) °-swelling of the ankles, feet, hands °-unusually weak or tired °  Side effects that usually do not require medical attention (report to your doctor or health care professional if they continue or are bothersome): °-diarrhea °-fever, chills (flu-like symptoms) °-headaches °-nausea, vomiting °-redness, stinging, or swelling at  site where injected °This list may not describe all possible side effects. Call your doctor for medical advice about side effects. You may report side effects to FDA at 1-800-FDA-1088. °Where should I keep my medicine? °Keep out of the reach of children. °Store in a refrigerator between 2 and 8 degrees C (36 and 46 degrees F). Do not freeze or shake. Throw away any unused portion if using a single-dose vial. Multi-dose vials can be kept in the refrigerator for up to 21 days after the initial dose. Throw away unused medicine. °NOTE: This sheet is a summary. It may not cover all possible information. If you have questions about this medicine, talk to your doctor, pharmacist, or health care provider. °© 2018 [employer]vier/Gold Standard (2015-11-16 19:42:31) ° °

## 2017-01-18 ENCOUNTER — Ambulatory Visit (HOSPITAL_COMMUNITY)
Admission: RE | Admit: 2017-01-18 | Discharge: 2017-01-18 | Disposition: A | Discharge status: Home / Self Care | Payer: Medicare Other | Source: Ambulatory Visit | Attending: Nephrology | Admitting: Nephrology

## 2017-01-18 DIAGNOSIS — D631 Anemia in chronic kidney disease: Secondary | ICD-10-CM | POA: Insufficient documentation

## 2017-01-18 DIAGNOSIS — N184 Chronic kidney disease, stage 4 (severe): Secondary | ICD-10-CM

## 2017-01-18 LAB — POCT HEMOGLOBIN-HEMACUE: Hemoglobin: 9.1 g/dL — ABNORMAL LOW (ref 12.0–15.0)

## 2017-01-18 MED ORDER — EPOETIN ALFA 10000 UNIT/ML IJ SOLN
10000.0000 [IU] | INTRAMUSCULAR | Status: DC
  Administered 2017-01-18: 10000 [IU] via SUBCUTANEOUS

## 2017-01-18 MED ORDER — EPOETIN ALFA 10000 UNIT/ML IJ SOLN
INTRAMUSCULAR | Status: AC
  Administered 2017-01-18: 10000 [IU] via SUBCUTANEOUS
  Filled 2017-01-18: qty 1

## 2017-02-01 ENCOUNTER — Encounter (HOSPITAL_COMMUNITY)
Admission: RE | Admit: 2017-02-01 | Discharge: 2017-02-01 | Disposition: A | Discharge status: Home / Self Care | Payer: Medicare Other | Source: Ambulatory Visit | Attending: Nephrology | Admitting: Nephrology

## 2017-02-01 DIAGNOSIS — N184 Chronic kidney disease, stage 4 (severe): Secondary | ICD-10-CM | POA: Diagnosis not present

## 2017-02-01 DIAGNOSIS — N2581 Secondary hyperparathyroidism of renal origin: Secondary | ICD-10-CM | POA: Insufficient documentation

## 2017-02-01 LAB — CBC
HCT: 32.7 % — ABNORMAL LOW (ref 36.0–46.0)
Hemoglobin: 10.1 g/dL — ABNORMAL LOW (ref 12.0–15.0)
MCH: 29.6 pg (ref 26.0–34.0)
MCHC: 30.9 g/dL (ref 30.0–36.0)
MCV: 95.9 fL (ref 78.0–100.0)
Platelets: 191 10*3/uL (ref 150–400)
RBC: 3.41 MIL/uL — ABNORMAL LOW (ref 3.87–5.11)
RDW: 14.7 % (ref 11.5–15.5)
WBC: 4.7 10*3/uL (ref 4.0–10.5)

## 2017-02-01 LAB — RENAL FUNCTION PANEL
Albumin: 3.8 g/dL (ref 3.5–5.0)
Anion gap: 9 (ref 5–15)
BUN: 80 mg/dL — ABNORMAL HIGH (ref 6–20)
CO2: 28 mmol/L (ref 22–32)
Calcium: 9.3 mg/dL (ref 8.9–10.3)
Chloride: 106 mmol/L (ref 101–111)
Creatinine, Ser: 3.79 mg/dL — ABNORMAL HIGH (ref 0.44–1.00)
GFR calc Af Amer: 12 mL/min — ABNORMAL LOW (ref >60)
GFR calc non Af Amer: 10 mL/min — ABNORMAL LOW (ref >60)
Glucose, Bld: 137 mg/dL — ABNORMAL HIGH (ref 65–99)
Phosphorus: 4.4 mg/dL (ref 2.5–4.6)
Potassium: 3.4 mmol/L — ABNORMAL LOW (ref 3.5–5.1)
Sodium: 143 mmol/L (ref 135–145)

## 2017-02-01 LAB — IRON AND TIBC
Iron: 57 ug/dL (ref 28–170)
Saturation Ratios: 20 % (ref 10.4–31.8)
TIBC: 290 ug/dL (ref 250–450)
UIBC: 233 ug/dL

## 2017-02-01 LAB — FERRITIN: Ferritin: 155 ng/mL (ref 11–307)

## 2017-02-01 LAB — POCT HEMOGLOBIN-HEMACUE: Hemoglobin: 10.1 g/dL — ABNORMAL LOW (ref 12.0–15.0)

## 2017-02-01 LAB — MAGNESIUM: Magnesium: 2.3 mg/dL (ref 1.7–2.4)

## 2017-02-01 MED ORDER — EPOETIN ALFA 10000 UNIT/ML IJ SOLN: 10000.0000 [IU] | INTRAMUSCULAR | Status: DC

## 2017-02-01 MED ORDER — EPOETIN ALFA 10000 UNIT/ML IJ SOLN
INTRAMUSCULAR | Status: AC
  Administered 2017-02-01: 10000 [IU]
  Filled 2017-02-01: qty 1

## 2017-02-15 ENCOUNTER — Encounter (HOSPITAL_COMMUNITY): Payer: Self-pay

## 2017-02-15 ENCOUNTER — Emergency Department (HOSPITAL_COMMUNITY)
Admission: EM | Admit: 2017-02-15 | Discharge: 2017-02-15 | Disposition: A | Discharge status: Home / Self Care | Payer: Medicare Other | Attending: Emergency Medicine | Admitting: Emergency Medicine

## 2017-02-15 ENCOUNTER — Emergency Department (HOSPITAL_COMMUNITY): Payer: Medicare Other

## 2017-02-15 ENCOUNTER — Ambulatory Visit (HOSPITAL_COMMUNITY)
Admission: RE | Admit: 2017-02-15 | Discharge: 2017-02-15 | Disposition: A | Discharge status: Home / Self Care | Payer: Medicare Other | Source: Ambulatory Visit | Attending: Nephrology | Admitting: Nephrology

## 2017-02-15 VITALS — BP 123/51 | HR 46 | Temp 97.2°F | Resp 16

## 2017-02-15 DIAGNOSIS — Z79899 Other long term (current) drug therapy: Secondary | ICD-10-CM | POA: Diagnosis not present

## 2017-02-15 DIAGNOSIS — W19XXXA Unspecified fall, initial encounter: Secondary | ICD-10-CM

## 2017-02-15 DIAGNOSIS — D631 Anemia in chronic kidney disease: Secondary | ICD-10-CM | POA: Diagnosis not present

## 2017-02-15 DIAGNOSIS — Z5181 Encounter for therapeutic drug level monitoring: Secondary | ICD-10-CM | POA: Diagnosis not present

## 2017-02-15 DIAGNOSIS — H538 Other visual disturbances: Secondary | ICD-10-CM | POA: Insufficient documentation

## 2017-02-15 DIAGNOSIS — W109XXA Fall (on) (from) unspecified stairs and steps, initial encounter: Secondary | ICD-10-CM | POA: Insufficient documentation

## 2017-02-15 DIAGNOSIS — Y939 Activity, unspecified: Secondary | ICD-10-CM | POA: Diagnosis not present

## 2017-02-15 DIAGNOSIS — N184 Chronic kidney disease, stage 4 (severe): Secondary | ICD-10-CM | POA: Insufficient documentation

## 2017-02-15 DIAGNOSIS — Y929 Unspecified place or not applicable: Secondary | ICD-10-CM | POA: Diagnosis not present

## 2017-02-15 DIAGNOSIS — Y999 Unspecified external cause status: Secondary | ICD-10-CM | POA: Diagnosis not present

## 2017-02-15 LAB — POCT HEMOGLOBIN-HEMACUE: Hemoglobin: 9.9 g/dL — ABNORMAL LOW (ref 12.0–15.0)

## 2017-02-15 MED ORDER — EPOETIN ALFA 10000 UNIT/ML IJ SOLN
INTRAMUSCULAR | Status: DC
Start: 2017-02-15 — End: 2017-02-16
  Filled 2017-02-15: qty 1

## 2017-02-15 MED ORDER — EPOETIN ALFA 10000 UNIT/ML IJ SOLN
10000.0000 [IU] | INTRAMUSCULAR | Status: DC
  Administered 2017-02-15: 10000 [IU] via SUBCUTANEOUS

## 2017-02-15 NOTE — ED Triage Notes (Signed)
Pt states she lost her footing and fell and hit the back of her head, no LOC, no dizziness Pt states she had a little blurry vision right afterwards

## 2017-02-15 NOTE — ED Provider Notes (Signed)
[facility] DEPT Provider Note   CSN: [id number] Arrival date & time: 02/15/17  1927     History   Chief Complaint Chief Complaint  Patient presents with  . Fall    HPI CALEAH TORTORELLI is a 81 y.o. female who presents the emergency department today for mechanical fall.  Patient states that she was walking up stairs, and when placing her foot on the first step she lost her footing, causing her to fall backwards and hit her head on the cement.  She denies any loss of consciousness.  No preceding chest pain, visual changes, dizziness, lightheadedness, shortness of breath, unilateral weakness.  She notes immediately after the event she did have a few seconds of blurry vision that resolved.  She is asymptomatic currently. Patient walks with cane at baseline. Denies HA, focal weakness,  palpitations, diaphoresis, neck pain, unilateral weakness, facial asymmetry, difficulty with speech, change in gait, tinnitus, changes in hearing, N/V.  Patient is not on any blood thinners.   HPI  Past Medical History:  Diagnosis Date  . CAP (community acquired pneumonia) 01/09/2014   "first time I've ever had it"  . Diastolic congestive heart failure ([place])   . GERD (gastroesophageal reflux disease)   . High cholesterol   . History of gout   . Hypertension   . Kidney stones   . Type II diabetes mellitus with stage 4 chronic kidney disease [facility])     Patient Active Problem List   Diagnosis Date Noted  . Pulmonary hypertension ([place]) 10/13/2016  . Left bundle branch block 12/22/2014  . Renal failure, acute on chronic (HCC)   . Pleural effusion on right   . Lung infiltrate   . Acute respiratory failure, unspecified whether with hypoxia or hypercapnia ([place])   . CHF exacerbation ([place])   . Acute on chronic renal failure ([place])   . Acute exacerbation of CHF (congestive heart failure) ([place]) 08/28/2014  . Acute diastolic CHF (congestive heart failure) ([place]) 08/28/2014  .  Chronic kidney disease (CKD), stage IV (severe) ([place])   . Acute on chronic combined systolic and diastolic CHF (congestive heart failure) ([place])   . Sleep apnea 07/29/2014  . Obesity (BMI 37) 04/14/2014  . Bradycardia- beta blocker decreased 04/14/2014  . Pulmonary infiltrates   . Diastolic CHF, acute on chronic (HCC) 04/11/2014  . Type 2 diabetes mellitus with stage 4 chronic kidney disease ([place]) 04/11/2014  . Essential hypertension 04/11/2014  . Gout 04/11/2014  . HCAP (healthcare-associated pneumonia)   . Hyperkalemia 02/08/2014  . Chronic diastolic heart failure ([place]) 02/05/2014  . CKD (chronic kidney disease), stage IV ([place]) 02/05/2014  . CAP (community acquired pneumonia) 01/09/2014  . Acute respiratory failure with hypoxia ([place]) 01/09/2014  . Anemia 01/09/2014  . AKI (acute kidney injury) ([place]) 01/09/2014    Past Surgical History:  Procedure Laterality Date  . ABDOMINAL HYSTERECTOMY  ~ 1974  . CATARACT EXTRACTION W/ INTRAOCULAR LENS  IMPLANT, BILATERAL Bilateral 2014  . KIDNEY STONE SURGERY  10-05-12   "cut me on the side"  . LITHOTRIPSY  1980's?    OB History    No data available       Home Medications    Prior to Admission medications   Medication Sig Start Date End Date Taking? Authorizing Provider  allopurinol (ZYLOPRIM) 100 MG tablet Take 100 mg by mouth 2 (two) times daily.   Yes [provider]  aspirin EC 81 MG tablet Take 81 mg by mouth daily.  Yes [provider]  carvedilol (COREG) 12.5 MG tablet Take 12.5 mg by mouth 2 (two) times daily with a meal.   Yes [provider]  hydrALAZINE (APRESOLINE) 25 MG tablet Take 25 mg by mouth 2 (two) times daily.    Yes [provider]  latanoprost (XALATAN) 0.005 % ophthalmic solution Place 1 drop into both eyes at bedtime.   Yes [provider]  torsemide (DEMADEX) 20 MG tablet Take 1-2 tablets (20-40 mg total) by mouth 2 (two) times daily. Take 2 tabs daily at 8 AM and 1  tab daily after lunch at 1 PM. Patient taking differently: Take 20-40 mg See admin instructions by mouth. Take 2 tabs daily at 8 AM and 1 tab qhs 09/11/14  Yes Hongalgi, Lenis Dickinson, MD  Vitamin D, Cholecalciferol, 400 UNITS CAPS Take 2 capsules by mouth daily.   Yes [provider]  albuterol (PROVENTIL HFA;VENTOLIN HFA) 108 (90 BASE) MCG/ACT inhaler Inhale 2 puffs into the lungs every 6 (six) hours as needed for wheezing or shortness of breath. 01/13/14   [name], MD    Family History Family History  Problem Relation Age of Onset  . Hypertension Mother   . Diabetes Brother   . CAD Neg Hx     Social History Social History   Tobacco Use  . Smoking status: Never Smoker  . Smokeless tobacco: Never Used  Substance Use Topics  . Alcohol use: No  . Drug use: No     Allergies   Simvastatin and Codeine   Review of Systems Review of Systems  All other systems reviewed and are negative.    Physical Exam Updated Vital Signs BP (!) 158/71 (BP Location: Left Arm)   Pulse 69   Temp 98.3 F (36.8 C) (Oral)   Resp 18   SpO2 100%   Physical Exam  Constitutional: She is oriented to person, place, and time. She appears well-developed and well-nourished.  Non-toxic appearing  HENT:  Head: Normocephalic and atraumatic. Head is without raccoon's eyes and without Battle's sign.  Right Ear: Hearing, tympanic membrane, external ear and ear canal normal. Tympanic membrane is not perforated and not erythematous. No hemotympanum.  Left Ear: Hearing, tympanic membrane, external ear and ear canal normal. Tympanic membrane is not perforated and not erythematous. No hemotympanum.  Nose: Nose normal. No rhinorrhea. Right sinus exhibits no maxillary sinus tenderness and no frontal sinus tenderness. Left sinus exhibits no maxillary sinus tenderness and no frontal sinus tenderness.  Mouth/Throat: Uvula is midline, oropharynx is clear and moist and mucous membranes are normal.  No CSF  otorrhea.  No evidence of open or depressed skull fracture.  No tenderness palpation of the scalp.  Eyes: Conjunctivae, EOM and lids are normal. Pupils are equal, round, and reactive to light. Right eye exhibits no discharge. Left eye exhibits no discharge. Right conjunctiva is not injected. Left conjunctiva is not injected. No scleral icterus. Right eye exhibits normal extraocular motion and no nystagmus. Left eye exhibits normal extraocular motion and no nystagmus.  Neck: Trachea normal, normal range of motion, full passive range of motion without pain and phonation normal. Neck supple. No spinous process tenderness and no muscular tenderness present. No neck rigidity. No tracheal deviation and normal range of motion present.  Cardiovascular: Normal rate, regular rhythm, normal heart sounds and intact distal pulses.  Pulses:      Radial pulses are 2+ on the right side, and 2+ on the left side.  Dorsalis pedis pulses are 2+ on the right side, and 2+ on the left side.       Posterior tibial pulses are 2+ on the right side, and 2+ on the left side.  Pulmonary/Chest: Effort normal and breath sounds normal. No respiratory distress.  Neurological: She is alert and oriented to person, place, and time. She has normal strength and normal reflexes. No cranial nerve deficit or sensory deficit. She displays a negative Romberg sign. Coordination and gait normal.  Reflex Scores:      Bicep reflexes are 2+ on the right side and 2+ on the left side.      Patellar reflexes are 2+ on the right side and 2+ on the left side.      Achilles reflexes are 2+ on the right side and 2+ on the left side. Mental Status: Alert, oriented, thought content appropriate, able to give a coherent history. Speech fluent without evidence of aphasia. Able to follow 2 step commands without difficulty. Cranial Nerves: II: Peripheral visual fields grossly normal, pupils equal, round, reactive to light III,IV, VI: ptosis not  present, extra-ocular motions intact bilaterally V,VII: smile symmetric, eyebrows raise symmetric, facial light touch sensation equal VIII: hearing grossly normal to voice X: uvula elevates symmetrically XI: bilateral shoulder shrug symmetric and strong XII: midline tongue extension without fassiculations Motor: Normal tone. 5/5 in upper and lower extremities bilaterally including strong and equal grip strength and dorsiflexion/plantar flexion Sensory: Sensation intact to light touch in all extremities.Negative Romberg.  Deep Tendon Reflexes: 2+ and symmetric in the biceps and patella Cerebellar: normal finger-to-nose with bilateral upper extremities. Normal heel-to -shin balance bilaterally of the lower extremity. No pronator drift.  Gait: normal gait (with cane) and balance CV: distal pulses palpable throughout   Skin: She is not diaphoretic. No pallor.  Psychiatric: She has a normal mood and affect.  Nursing note and vitals reviewed.     ED Treatments / Results  Labs (all labs ordered are listed, but only abnormal results are displayed) Labs Reviewed - No data to display  EKG  EKG Interpretation None       Radiology Ct Head Wo Contrast  Result Date: 02/15/2017 CLINICAL DATA:  Fall down steps with head injury. EXAM: CT HEAD WITHOUT CONTRAST TECHNIQUE: Contiguous axial images were obtained from the base of the skull through the vertex without intravenous contrast. COMPARISON:  07/23/2008 head CT. FINDINGS: Brain: Focal encephalomalacia in the left cerebellar hemisphere is new since 07/23/2008. Old right basal ganglia / internal capsule infarct. No evidence of parenchymal hemorrhage or extra-axial fluid collection. No mass lesion, mass effect, or midline shift. No CT evidence of acute infarction. Nonspecific moderate subcortical and periventricular white matter hypodensity, most in keeping with chronic small vessel ischemic change. Cerebral ventricle sizes are stable and  concordant with the degree of cerebral volume loss. Vascular: No acute abnormality . Skull: No evidence of calvarial fracture. Sinuses/Orbits: No fluid levels. Partial opacification of the ethmoidal air cells. Mucoperiosteal thickening in the maxillary sinuses. Other:  The mastoid air cells are unopacified. IMPRESSION: 1. No evidence of acute intracranial abnormality. No evidence of calvarial fracture. 2. Moderate chronic small vessel ischemic changes in the cerebral white matter. 3. 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 Electronically Signed   By: Ilona Sorrel M.D.   On: 02/15/2017 22:04    Procedures Procedures (including critical care time)  Medications Ordered in ED Medications - No data to display   Initial Impression / Assessment and Plan /  ED Course  I have reviewed the triage vital signs and the nursing notes.  Pertinent labs & imaging results that were available during my care of the patient were reviewed by me and considered in my medical decision making (see chart for details).     81 year old female with mechanical fall earlier this evening.  Patient did not lose consciousness and is not on any blood thinners.  She is asymptomatic currently.  The patient denies any neck pain. There is posterior midline cervical spine tenderness and no step offs. She is A&Ox3. There are no focal neurologic deficits and patient has normal neuro exam. She has full ROM of the neck without pain.  Do not feel imaging of the neck is needed at this time.  Discussed this with the family and they are in agreement.  CT of the head done in triage shows chronic changes without any acute intracranial abnormality.  Due to the patient's reassuring CT scan findings, being asymptomatic currently in normal neurologic exam I feel the patient is safe for outpatient follow-up. Strict return precautions discussed for reasons to return to the emergency department.  She is to follow-up with her PCP.  Patient  and family in agreement with plan.  Patient appears safe for discharge. Patient case seen and discussed with Dr. Wilson Singer who is in agreement with plan.   Final Clinical Impressions(s) / ED Diagnoses   Final diagnoses:  Fall, initial encounter    ED Discharge Orders    None       Lorelle Gibbs 02/16/17 Aggie Cosier, MD 02/21/17 1231

## 2017-02-15 NOTE — Discharge Instructions (Signed)
You were seen here today for fall.  Your CT scan was reassuring.  Your exam was reassuring as well.  Please follow-up with your primary care provider as needed. Return for headaches, disequilibrium, vomiting, double vision, extremity weakness, difficulty ambulating, or any other concerning symptoms.

## 2017-02-28 ENCOUNTER — Other Ambulatory Visit (HOSPITAL_COMMUNITY): Payer: Self-pay | Admitting: *Deleted

## 2017-02-28 ENCOUNTER — Other Ambulatory Visit: Payer: Self-pay

## 2017-02-28 DIAGNOSIS — Z01818 Encounter for other preprocedural examination: Secondary | ICD-10-CM

## 2017-02-28 DIAGNOSIS — Z992 Dependence on renal dialysis: Secondary | ICD-10-CM

## 2017-02-28 DIAGNOSIS — N186 End stage renal disease: Secondary | ICD-10-CM

## 2017-03-01 ENCOUNTER — Encounter (HOSPITAL_COMMUNITY)
Admission: RE | Admit: 2017-03-01 | Discharge: 2017-03-01 | Disposition: A | Discharge status: Home / Self Care | Payer: Medicare Other | Source: Ambulatory Visit | Attending: Nephrology | Admitting: Nephrology

## 2017-03-01 VITALS — BP 142/68 | HR 66 | Temp 98.6°F | Resp 20 | Ht 61.0 in | Wt 169.0 lb

## 2017-03-01 DIAGNOSIS — D631 Anemia in chronic kidney disease: Secondary | ICD-10-CM | POA: Diagnosis present

## 2017-03-01 DIAGNOSIS — N184 Chronic kidney disease, stage 4 (severe): Secondary | ICD-10-CM | POA: Insufficient documentation

## 2017-03-01 LAB — POCT HEMOGLOBIN-HEMACUE: Hemoglobin: 10.1 g/dL — ABNORMAL LOW (ref 12.0–15.0)

## 2017-03-01 MED ORDER — EPOETIN ALFA 20000 UNIT/ML IJ SOLN
INTRAMUSCULAR | Status: AC
  Administered 2017-03-01: 20000 [IU] via SUBCUTANEOUS
  Filled 2017-03-01: qty 1

## 2017-03-01 MED ORDER — SODIUM CHLORIDE 0.9 % IV SOLN
510.0000 mg | INTRAVENOUS | Status: DC
  Administered 2017-03-01: 510 mg via INTRAVENOUS
  Filled 2017-03-01: qty 17

## 2017-03-01 MED ORDER — EPOETIN ALFA 20000 UNIT/ML IJ SOLN
20000.0000 [IU] | INTRAMUSCULAR | Status: DC
  Administered 2017-03-01: 20000 [IU] via SUBCUTANEOUS

## 2017-03-09 ENCOUNTER — Ambulatory Visit (HOSPITAL_COMMUNITY)
Admission: RE | Admit: 2017-03-09 | Discharge: 2017-03-09 | Disposition: A | Discharge status: Home / Self Care | Payer: Medicare Other | Source: Ambulatory Visit | Attending: Nephrology | Admitting: Nephrology

## 2017-03-09 DIAGNOSIS — N189 Chronic kidney disease, unspecified: Secondary | ICD-10-CM | POA: Diagnosis not present

## 2017-03-09 DIAGNOSIS — D631 Anemia in chronic kidney disease: Secondary | ICD-10-CM | POA: Diagnosis not present

## 2017-03-09 LAB — RENAL FUNCTION PANEL
Albumin: 3.7 g/dL (ref 3.5–5.0)
Anion gap: 8 (ref 5–15)
BUN: 76 mg/dL — ABNORMAL HIGH (ref 6–20)
CO2: 29 mmol/L (ref 22–32)
Calcium: 9.2 mg/dL (ref 8.9–10.3)
Chloride: 104 mmol/L (ref 101–111)
Creatinine, Ser: 3.5 mg/dL — ABNORMAL HIGH (ref 0.44–1.00)
GFR calc Af Amer: 13 mL/min — ABNORMAL LOW (ref >60)
GFR calc non Af Amer: 11 mL/min — ABNORMAL LOW (ref >60)
Glucose, Bld: 100 mg/dL — ABNORMAL HIGH (ref 65–99)
Phosphorus: 5.3 mg/dL — ABNORMAL HIGH (ref 2.5–4.6)
Potassium: 3.6 mmol/L (ref 3.5–5.1)
Sodium: 141 mmol/L (ref 135–145)

## 2017-03-09 MED ORDER — SODIUM CHLORIDE 0.9 % IV SOLN
510.0000 mg | INTRAVENOUS | Status: DC
  Administered 2017-03-09: 510 mg via INTRAVENOUS
  Filled 2017-03-09: qty 17

## 2017-03-10 LAB — PTH, INTACT AND CALCIUM
Calcium, Total (PTH): 9.4 mg/dL (ref 8.7–10.3)
PTH: 248 pg/mL — ABNORMAL HIGH (ref 15–65)

## 2017-03-23 ENCOUNTER — Encounter (HOSPITAL_COMMUNITY)
Admission: RE | Admit: 2017-03-23 | Discharge: 2017-03-23 | Disposition: A | Discharge status: Home / Self Care | Payer: Medicare Other | Source: Ambulatory Visit | Attending: Nephrology | Admitting: Nephrology

## 2017-03-23 VITALS — BP 118/47 | HR 61 | Temp 98.0°F | Resp 20

## 2017-03-23 DIAGNOSIS — D631 Anemia in chronic kidney disease: Secondary | ICD-10-CM | POA: Insufficient documentation

## 2017-03-23 DIAGNOSIS — N184 Chronic kidney disease, stage 4 (severe): Secondary | ICD-10-CM | POA: Insufficient documentation

## 2017-03-23 LAB — FERRITIN: Ferritin: 722 ng/mL — ABNORMAL HIGH (ref 11–307)

## 2017-03-23 LAB — IRON AND TIBC
Iron: 96 ug/dL (ref 28–170)
Saturation Ratios: 36 % — ABNORMAL HIGH (ref 10.4–31.8)
TIBC: 266 ug/dL (ref 250–450)
UIBC: 170 ug/dL

## 2017-03-23 LAB — POCT HEMOGLOBIN-HEMACUE: Hemoglobin: 11.1 g/dL — ABNORMAL LOW (ref 12.0–15.0)

## 2017-03-23 MED ORDER — EPOETIN ALFA 20000 UNIT/ML IJ SOLN
20000.0000 [IU] | INTRAMUSCULAR | Status: DC
  Administered 2017-03-23: 20000 [IU] via SUBCUTANEOUS

## 2017-03-23 MED ORDER — EPOETIN ALFA 20000 UNIT/ML IJ SOLN
INTRAMUSCULAR | Status: AC
  Filled 2017-03-23: qty 1

## 2017-03-31 ENCOUNTER — Encounter (HOSPITAL_COMMUNITY)
Admission: RE | Admit: 2017-03-31 | Discharge: 2017-03-31 | Disposition: A | Discharge status: Home / Self Care | Payer: Medicare Other | Source: Ambulatory Visit | Attending: Nephrology | Admitting: Nephrology

## 2017-03-31 VITALS — BP 126/58 | HR 56 | Temp 98.0°F | Resp 20

## 2017-03-31 DIAGNOSIS — N184 Chronic kidney disease, stage 4 (severe): Secondary | ICD-10-CM

## 2017-03-31 LAB — RENAL FUNCTION PANEL
Albumin: 4 g/dL (ref 3.5–5.0)
Anion gap: 11 (ref 5–15)
BUN: 71 mg/dL — ABNORMAL HIGH (ref 6–20)
CO2: 27 mmol/L (ref 22–32)
Calcium: 9.5 mg/dL (ref 8.9–10.3)
Chloride: 102 mmol/L (ref 101–111)
Creatinine, Ser: 3.61 mg/dL — ABNORMAL HIGH (ref 0.44–1.00)
GFR calc Af Amer: 13 mL/min — ABNORMAL LOW (ref >60)
GFR calc non Af Amer: 11 mL/min — ABNORMAL LOW (ref >60)
Glucose, Bld: 102 mg/dL — ABNORMAL HIGH (ref 65–99)
Phosphorus: 4.7 mg/dL — ABNORMAL HIGH (ref 2.5–4.6)
Potassium: 3.6 mmol/L (ref 3.5–5.1)
Sodium: 140 mmol/L (ref 135–145)

## 2017-03-31 MED ORDER — EPOETIN ALFA 20000 UNIT/ML IJ SOLN
INTRAMUSCULAR | Status: AC
  Administered 2017-03-31: 20000 [IU] via SUBCUTANEOUS
  Filled 2017-03-31: qty 1

## 2017-03-31 MED ORDER — EPOETIN ALFA 20000 UNIT/ML IJ SOLN
20000.0000 [IU] | INTRAMUSCULAR | Status: DC
  Administered 2017-03-31: 20000 [IU] via SUBCUTANEOUS

## 2017-04-01 LAB — PTH, INTACT AND CALCIUM
Calcium, Total (PTH): 9.7 mg/dL (ref 8.7–10.3)
PTH: 210 pg/mL — ABNORMAL HIGH (ref 15–65)

## 2017-04-05 ENCOUNTER — Encounter (HOSPITAL_COMMUNITY): Payer: Medicare Other

## 2017-04-05 ENCOUNTER — Encounter: Payer: Medicare Other | Admitting: Surgery

## 2017-04-05 ENCOUNTER — Other Ambulatory Visit (HOSPITAL_COMMUNITY): Payer: Medicare Other

## 2017-04-05 LAB — POCT HEMOGLOBIN-HEMACUE: Hemoglobin: 11.8 g/dL — ABNORMAL LOW (ref 12.0–15.0)

## 2017-04-14 ENCOUNTER — Encounter (HOSPITAL_COMMUNITY)
Admission: RE | Admit: 2017-04-14 | Discharge: 2017-04-14 | Disposition: A | Discharge status: Home / Self Care | Payer: Medicare Other | Source: Ambulatory Visit | Attending: Nephrology | Admitting: Nephrology

## 2017-04-14 VITALS — BP 117/40 | HR 40 | Temp 98.0°F | Resp 20

## 2017-04-14 DIAGNOSIS — N184 Chronic kidney disease, stage 4 (severe): Secondary | ICD-10-CM | POA: Diagnosis not present

## 2017-04-14 DIAGNOSIS — D631 Anemia in chronic kidney disease: Secondary | ICD-10-CM | POA: Insufficient documentation

## 2017-04-14 LAB — POCT HEMOGLOBIN-HEMACUE: Hemoglobin: 11.1 g/dL — ABNORMAL LOW (ref 12.0–15.0)

## 2017-04-14 MED ORDER — EPOETIN ALFA 20000 UNIT/ML IJ SOLN
20000.0000 [IU] | INTRAMUSCULAR | Status: DC
  Administered 2017-04-14: 20000 [IU] via SUBCUTANEOUS

## 2017-04-14 MED ORDER — EPOETIN ALFA 20000 UNIT/ML IJ SOLN
INTRAMUSCULAR | Status: AC
  Administered 2017-04-14: 20000 [IU] via SUBCUTANEOUS
  Filled 2017-04-14: qty 1

## 2017-04-24 ENCOUNTER — Ambulatory Visit (INDEPENDENT_AMBULATORY_CARE_PROVIDER_SITE_OTHER)
Admission: RE | Admit: 2017-04-24 | Discharge: 2017-04-24 | Disposition: A | Discharge status: Home / Self Care | Payer: Medicare Other | Source: Ambulatory Visit | Attending: Surgery | Admitting: Surgery

## 2017-04-24 ENCOUNTER — Encounter: Payer: Self-pay | Admitting: Surgery

## 2017-04-24 ENCOUNTER — Ambulatory Visit (HOSPITAL_COMMUNITY)
Admission: RE | Admit: 2017-04-24 | Discharge: 2017-04-24 | Disposition: A | Discharge status: Home / Self Care | Payer: Medicare Other | Source: Ambulatory Visit | Attending: Surgery | Admitting: Surgery

## 2017-04-24 ENCOUNTER — Ambulatory Visit (INDEPENDENT_AMBULATORY_CARE_PROVIDER_SITE_OTHER): Payer: Medicare Other | Admitting: Surgery

## 2017-04-24 VITALS — BP 138/80 | HR 67 | Resp 18 | Ht 61.0 in | Wt 171.0 lb

## 2017-04-24 DIAGNOSIS — N186 End stage renal disease: Secondary | ICD-10-CM

## 2017-04-24 DIAGNOSIS — Z01818 Encounter for other preprocedural examination: Secondary | ICD-10-CM

## 2017-04-24 DIAGNOSIS — N184 Chronic kidney disease, stage 4 (severe): Secondary | ICD-10-CM

## 2017-04-24 DIAGNOSIS — Z992 Dependence on renal dialysis: Secondary | ICD-10-CM

## 2017-04-24 NOTE — Progress Notes (Signed)
[facility]  Patient name: Doris Howell MRN: [id number] DOB: 01/25/1936 Sex: female   REQUESTING PROVIDER:    Dr. Posey Pronto    REASON FOR CONSULT:    CKD 4  HISTORY OF PRESENT ILLNESS:   Doris Howell is a 82 y.o. female, who is right-handed, who is sent over for evaluation for dialysis.  She has a history of left-sided nephrectomy for staghorn calculus.  She has a history of hypertension as well as type 2 diabetes with retinopathy.  She was hospitalized in 2017 for CHF exacerbation.  PAST MEDICAL HISTORY    Past Medical History:  Diagnosis Date  . CAP (community acquired pneumonia) 01/09/2014   "first time I've ever had it"  . Diastolic congestive heart failure ([place])   . GERD (gastroesophageal reflux disease)   . High cholesterol   . History of gout   . Hypertension   . Kidney stones   . Type II diabetes mellitus with stage 4 chronic kidney disease (HCC)      FAMILY HISTORY   Family History  Problem Relation Age of Onset  . Hypertension Mother   . Diabetes Brother   . CAD Neg Hx     SOCIAL HISTORY:   Social History   Socioeconomic History  . Marital status: Married    Spouse name: Not on file  . Number of children: Not on file  . Years of education: Not on file  . Highest education level: Not on file  Social Needs  . Financial resource strain: Not on file  . Food insecurity - worry: Not on file  . Food insecurity - inability: Not on file  . Transportation needs - medical: Not on file  . Transportation needs - non-medical: Not on file  Occupational History  . Not on file  Tobacco Use  . Smoking status: Never Smoker  . Smokeless tobacco: Never Used  Substance and Sexual Activity  . Alcohol use: No  . Drug use: No  . Sexual activity: Not Currently  Other Topics Concern  . Not on file  Social History Narrative  . Not on file    ALLERGIES:    Allergies  Allergen  Reactions  . Simvastatin Other (See Comments)    Myalgias- MD took patient off med  . Codeine Other (See Comments)    Unknown; pt can't remember. It was in the '70s    CURRENT MEDICATIONS:    Current Outpatient Medications  Medication Sig Dispense Refill  . albuterol (PROVENTIL HFA;VENTOLIN HFA) 108 (90 BASE) MCG/ACT inhaler Inhale 2 puffs into the lungs every 6 (six) hours as needed for wheezing or shortness of breath. 1 Inhaler 0  . allopurinol (ZYLOPRIM) 100 MG tablet Take 100 mg by mouth 2 (two) times daily.    [name] aspirin EC 81 MG tablet Take 81 mg by mouth daily.    . carvedilol (COREG) 12.5 MG tablet Take 12.5 mg by mouth 2 (two) times daily with a meal.    . hydrALAZINE (APRESOLINE) 25 MG tablet Take 25 mg by mouth 2 (two) times daily.     [name] latanoprost (XALATAN) 0.005 % ophthalmic solution Place 1 drop into both eyes at bedtime.    . torsemide (DEMADEX) 20 MG tablet Take 1-2 tablets (20-40 mg total) by mouth 2 (two) times daily. Take 2 tabs daily at 8 AM and 1 tab daily after lunch at 1 PM. (Patient taking differently: Take 20-40 mg See admin instructions by mouth. Take 2  tabs daily at 8 AM and 1 tab qhs) 90 tablet 0  . Vitamin D, Cholecalciferol, 400 UNITS CAPS Take 2 capsules by mouth daily.     No current facility-administered medications for this visit.     REVIEW OF SYSTEMS:   [X]  denotes positive finding, [ ]  denotes negative finding Cardiac  Comments:  Chest pain or chest pressure:    Shortness of breath upon exertion:    Short of breath when lying flat:    Irregular heart rhythm:        Vascular    Pain in calf, thigh, or hip brought on by ambulation:    Pain in feet at night that wakes you up from your sleep:  x   Blood clot in your veins:    Leg swelling:         Pulmonary    Oxygen at home:    Productive cough:     Wheezing:         Neurologic    Sudden weakness in arms or legs:     Sudden numbness in arms or legs:     Sudden onset of difficulty  speaking or slurred speech:    Temporary loss of vision in one eye:     Problems with dizziness:         Gastrointestinal    Blood in stool:      Vomited blood:         Genitourinary    Burning when urinating:     Blood in urine:        Psychiatric    Major depression:         Hematologic    Bleeding problems:    Problems with blood clotting too easily:        Skin    Rashes or ulcers:        Constitutional    Fever or chills:     PHYSICAL EXAM:   Vitals:   04/24/17 1530  Pulse: 67  Resp: 18  SpO2: 97%  Weight: 171 lb (77.6 kg)  Height: 5\' 1"  (1.549 m)    GENERAL: The patient is a well-nourished female, in no acute distress. The vital signs are documented above. CARDIAC: There is a regular rate and rhythm.  VASCULAR: Palpable radial pulses PULMONARY: Nonlabored respirations MUSCULOSKELETAL: There are no major deformities or cyanosis. NEUROLOGIC: No focal weakness or paresthesias are detected. SKIN: There are no ulcers or rashes noted. PSYCHIATRIC: The patient has a normal affect.  STUDIES:   I have ordered and reviewed the following studies Arterial Dopplers are within normal limits with triphasic waveforms with brachial bifurcation in the antecubital crease. Vein mapping shows no adequate vein on the right and potentially a usable cephalic or basilic vein on the left  ASSESSMENT and PLAN   I had a lengthy discussion with the patient.  I would consider an attempt at a left arm fistula.  This would either be a brachiocephalic fistula or a stage basilic vein fistula creation.  I discussed the possibility that this may not mature given the size of her veins.  Dr. Posey Pronto is not ready for a graft at this time.  The patient has had family members go on dialysis in the past and she is very leery about this whole process.  She would like to speak with Dr. Posey Pronto again next month at her next appointment, and then contact me to determine whether or not she wants to proceed  with a left  arm fistula.   Annamarie Major, MD [facility] [phone] Pager [phone]

## 2017-04-26 ENCOUNTER — Encounter (HOSPITAL_COMMUNITY)
Admission: RE | Admit: 2017-04-26 | Discharge: 2017-04-26 | Disposition: A | Discharge status: Home / Self Care | Payer: Medicare Other | Source: Ambulatory Visit | Attending: Nephrology | Admitting: Nephrology

## 2017-04-26 VITALS — BP 112/42 | HR 40 | Temp 98.0°F | Resp 20

## 2017-04-26 DIAGNOSIS — N184 Chronic kidney disease, stage 4 (severe): Secondary | ICD-10-CM | POA: Diagnosis not present

## 2017-04-26 LAB — IRON AND TIBC
Iron: 71 ug/dL (ref 28–170)
Saturation Ratios: 28 % (ref 10.4–31.8)
TIBC: 255 ug/dL (ref 250–450)
UIBC: 184 ug/dL

## 2017-04-26 LAB — RENAL FUNCTION PANEL
Albumin: 3.6 g/dL (ref 3.5–5.0)
Anion gap: 13 (ref 5–15)
BUN: 75 mg/dL — ABNORMAL HIGH (ref 6–20)
CO2: 23 mmol/L (ref 22–32)
Calcium: 9.2 mg/dL (ref 8.9–10.3)
Chloride: 107 mmol/L (ref 101–111)
Creatinine, Ser: 3.42 mg/dL — ABNORMAL HIGH (ref 0.44–1.00)
GFR calc Af Amer: 14 mL/min — ABNORMAL LOW (ref >60)
GFR calc non Af Amer: 12 mL/min — ABNORMAL LOW (ref >60)
Glucose, Bld: 115 mg/dL — ABNORMAL HIGH (ref 65–99)
Phosphorus: 4.8 mg/dL — ABNORMAL HIGH (ref 2.5–4.6)
Potassium: 3.5 mmol/L (ref 3.5–5.1)
Sodium: 143 mmol/L (ref 135–145)

## 2017-04-26 LAB — FERRITIN: Ferritin: 310 ng/mL — ABNORMAL HIGH (ref 11–307)

## 2017-04-26 LAB — POCT HEMOGLOBIN-HEMACUE: Hemoglobin: 12.1 g/dL (ref 12.0–15.0)

## 2017-04-26 MED ORDER — EPOETIN ALFA 20000 UNIT/ML IJ SOLN: 20000.0000 [IU] | INTRAMUSCULAR | Status: DC

## 2017-04-27 LAB — PTH, INTACT AND CALCIUM
Calcium, Total (PTH): 9.3 mg/dL (ref 8.7–10.3)
PTH: 240 pg/mL — ABNORMAL HIGH (ref 15–65)

## 2017-05-11 ENCOUNTER — Ambulatory Visit (HOSPITAL_COMMUNITY)
Admission: RE | Admit: 2017-05-11 | Discharge: 2017-05-11 | Disposition: A | Discharge status: Home / Self Care | Payer: Medicare Other | Source: Ambulatory Visit | Attending: Nephrology | Admitting: Nephrology

## 2017-05-11 VITALS — BP 139/51 | HR 63 | Temp 97.9°F | Resp 20

## 2017-05-11 DIAGNOSIS — N185 Chronic kidney disease, stage 5: Secondary | ICD-10-CM | POA: Diagnosis present

## 2017-05-11 DIAGNOSIS — N184 Chronic kidney disease, stage 4 (severe): Secondary | ICD-10-CM

## 2017-05-11 LAB — POCT HEMOGLOBIN-HEMACUE: Hemoglobin: 11.8 g/dL — ABNORMAL LOW (ref 12.0–15.0)

## 2017-05-11 MED ORDER — EPOETIN ALFA 20000 UNIT/ML IJ SOLN
INTRAMUSCULAR | Status: AC
  Administered 2017-05-11: 20000 [IU] via SUBCUTANEOUS
  Filled 2017-05-11: qty 1

## 2017-05-11 MED ORDER — EPOETIN ALFA 20000 UNIT/ML IJ SOLN
20000.0000 [IU] | INTRAMUSCULAR | Status: DC
  Administered 2017-05-11: 20000 [IU] via SUBCUTANEOUS

## 2017-05-24 ENCOUNTER — Ambulatory Visit (HOSPITAL_COMMUNITY)
Admission: RE | Admit: 2017-05-24 | Discharge: 2017-05-24 | Disposition: A | Discharge status: Home / Self Care | Payer: Medicare Other | Source: Ambulatory Visit | Attending: Nephrology | Admitting: Nephrology

## 2017-05-24 VITALS — BP 136/45 | HR 84 | Temp 98.0°F | Resp 20

## 2017-05-24 DIAGNOSIS — Z79899 Other long term (current) drug therapy: Secondary | ICD-10-CM | POA: Insufficient documentation

## 2017-05-24 DIAGNOSIS — D631 Anemia in chronic kidney disease: Secondary | ICD-10-CM | POA: Diagnosis not present

## 2017-05-24 DIAGNOSIS — Z5181 Encounter for therapeutic drug level monitoring: Secondary | ICD-10-CM | POA: Diagnosis not present

## 2017-05-24 DIAGNOSIS — N184 Chronic kidney disease, stage 4 (severe): Secondary | ICD-10-CM | POA: Diagnosis present

## 2017-05-24 LAB — RENAL FUNCTION PANEL
Albumin: 3.7 g/dL (ref 3.5–5.0)
Anion gap: 15 (ref 5–15)
BUN: 90 mg/dL — ABNORMAL HIGH (ref 6–20)
CO2: 24 mmol/L (ref 22–32)
Calcium: 9.2 mg/dL (ref 8.9–10.3)
Chloride: 104 mmol/L (ref 101–111)
Creatinine, Ser: 3.88 mg/dL — ABNORMAL HIGH (ref 0.44–1.00)
GFR calc Af Amer: 12 mL/min — ABNORMAL LOW (ref >60)
GFR calc non Af Amer: 10 mL/min — ABNORMAL LOW (ref >60)
Glucose, Bld: 131 mg/dL — ABNORMAL HIGH (ref 65–99)
Phosphorus: 5.2 mg/dL — ABNORMAL HIGH (ref 2.5–4.6)
Potassium: 3.7 mmol/L (ref 3.5–5.1)
Sodium: 143 mmol/L (ref 135–145)

## 2017-05-24 LAB — POCT HEMOGLOBIN-HEMACUE: Hemoglobin: 11.6 g/dL — ABNORMAL LOW (ref 12.0–15.0)

## 2017-05-24 LAB — IRON AND TIBC
Iron: 70 ug/dL (ref 28–170)
Saturation Ratios: 26 % (ref 10.4–31.8)
TIBC: 267 ug/dL (ref 250–450)
UIBC: 197 ug/dL

## 2017-05-24 LAB — FERRITIN: Ferritin: 370 ng/mL — ABNORMAL HIGH (ref 11–307)

## 2017-05-24 MED ORDER — EPOETIN ALFA 20000 UNIT/ML IJ SOLN
INTRAMUSCULAR | Status: AC
  Filled 2017-05-24: qty 1

## 2017-05-24 MED ORDER — EPOETIN ALFA 20000 UNIT/ML IJ SOLN
20000.0000 [IU] | INTRAMUSCULAR | Status: DC
  Administered 2017-05-24: 20000 [IU] via SUBCUTANEOUS

## 2017-05-25 ENCOUNTER — Encounter (HOSPITAL_COMMUNITY): Payer: Medicare Other

## 2017-05-25 LAB — PTH, INTACT AND CALCIUM
Calcium, Total (PTH): 9.1 mg/dL (ref 8.7–10.3)
PTH: 237 pg/mL — ABNORMAL HIGH (ref 15–65)

## 2017-06-07 ENCOUNTER — Encounter (HOSPITAL_COMMUNITY): Payer: Self-pay

## 2017-06-07 ENCOUNTER — Inpatient Hospital Stay (HOSPITAL_COMMUNITY)
Admission: RE | Admit: 2017-06-07 | Discharge: 2017-06-07 | Disposition: A | Discharge status: Home / Self Care | Payer: Medicare Other | Source: Ambulatory Visit | Attending: Nephrology | Admitting: Nephrology

## 2017-06-14 DIAGNOSIS — K219 Gastro-esophageal reflux disease without esophagitis: Secondary | ICD-10-CM | POA: Insufficient documentation

## 2017-06-14 DIAGNOSIS — R198 Other specified symptoms and signs involving the digestive system and abdomen: Secondary | ICD-10-CM | POA: Insufficient documentation

## 2017-06-14 DIAGNOSIS — R0989 Other specified symptoms and signs involving the circulatory and respiratory systems: Secondary | ICD-10-CM | POA: Insufficient documentation

## 2017-06-14 DIAGNOSIS — R490 Dysphonia: Secondary | ICD-10-CM | POA: Insufficient documentation

## 2017-06-15 ENCOUNTER — Ambulatory Visit (HOSPITAL_COMMUNITY)
Admission: RE | Admit: 2017-06-15 | Discharge: 2017-06-15 | Disposition: A | Discharge status: Home / Self Care | Payer: Medicare Other | Source: Ambulatory Visit | Attending: Nephrology | Admitting: Nephrology

## 2017-06-15 VITALS — BP 108/43 | HR 59 | Temp 98.4°F | Resp 20

## 2017-06-15 DIAGNOSIS — N184 Chronic kidney disease, stage 4 (severe): Secondary | ICD-10-CM | POA: Diagnosis present

## 2017-06-15 DIAGNOSIS — D631 Anemia in chronic kidney disease: Secondary | ICD-10-CM | POA: Insufficient documentation

## 2017-06-15 LAB — POCT HEMOGLOBIN-HEMACUE: Hemoglobin: 11.2 g/dL — ABNORMAL LOW (ref 12.0–15.0)

## 2017-06-15 MED ORDER — EPOETIN ALFA 20000 UNIT/ML IJ SOLN
20000.0000 [IU] | INTRAMUSCULAR | Status: DC
  Administered 2017-06-15: 20000 [IU] via SUBCUTANEOUS

## 2017-06-15 MED ORDER — EPOETIN ALFA 20000 UNIT/ML IJ SOLN
INTRAMUSCULAR | Status: AC
  Administered 2017-06-15: 20000 [IU] via SUBCUTANEOUS
  Filled 2017-06-15: qty 1

## 2017-06-21 ENCOUNTER — Encounter (HOSPITAL_COMMUNITY): Payer: Medicare Other

## 2017-06-29 ENCOUNTER — Encounter (HOSPITAL_COMMUNITY)
Admission: RE | Admit: 2017-06-29 | Discharge: 2017-06-29 | Disposition: A | Discharge status: Home / Self Care | Payer: Medicare Other | Source: Ambulatory Visit | Attending: Nephrology | Admitting: Nephrology

## 2017-06-29 VITALS — BP 123/52 | HR 49 | Temp 98.0°F | Resp 20

## 2017-06-29 DIAGNOSIS — D631 Anemia in chronic kidney disease: Secondary | ICD-10-CM | POA: Insufficient documentation

## 2017-06-29 DIAGNOSIS — N184 Chronic kidney disease, stage 4 (severe): Secondary | ICD-10-CM | POA: Insufficient documentation

## 2017-06-29 LAB — RENAL FUNCTION PANEL
Albumin: 3.7 g/dL (ref 3.5–5.0)
Anion gap: 11 (ref 5–15)
BUN: 89 mg/dL — ABNORMAL HIGH (ref 6–20)
CO2: 25 mmol/L (ref 22–32)
Calcium: 9.1 mg/dL (ref 8.9–10.3)
Chloride: 104 mmol/L (ref 101–111)
Creatinine, Ser: 3.78 mg/dL — ABNORMAL HIGH (ref 0.44–1.00)
GFR calc Af Amer: 12 mL/min — ABNORMAL LOW (ref >60)
GFR calc non Af Amer: 10 mL/min — ABNORMAL LOW (ref >60)
Glucose, Bld: 115 mg/dL — ABNORMAL HIGH (ref 65–99)
Phosphorus: 4.7 mg/dL — ABNORMAL HIGH (ref 2.5–4.6)
Potassium: 3.2 mmol/L — ABNORMAL LOW (ref 3.5–5.1)
Sodium: 140 mmol/L (ref 135–145)

## 2017-06-29 LAB — IRON AND TIBC
Iron: 69 ug/dL (ref 28–170)
Saturation Ratios: 27 % (ref 10.4–31.8)
TIBC: 255 ug/dL (ref 250–450)
UIBC: 186 ug/dL

## 2017-06-29 LAB — POCT HEMOGLOBIN-HEMACUE: Hemoglobin: 12 g/dL (ref 12.0–15.0)

## 2017-06-29 LAB — FERRITIN: Ferritin: 293 ng/mL (ref 11–307)

## 2017-06-29 MED ORDER — EPOETIN ALFA 20000 UNIT/ML IJ SOLN: 20000.0000 [IU] | INTRAMUSCULAR | Status: DC

## 2017-06-29 MED ORDER — EPOETIN ALFA 20000 UNIT/ML IJ SOLN
INTRAMUSCULAR | Status: AC
  Filled 2017-06-29: qty 1

## 2017-06-30 LAB — PTH, INTACT AND CALCIUM
Calcium, Total (PTH): 9.3 mg/dL (ref 8.7–10.3)
PTH: 236 pg/mL — ABNORMAL HIGH (ref 15–65)

## 2017-07-13 ENCOUNTER — Encounter (HOSPITAL_COMMUNITY): Payer: Medicare Other

## 2017-07-28 ENCOUNTER — Ambulatory Visit (HOSPITAL_COMMUNITY)
Admission: RE | Admit: 2017-07-28 | Discharge: 2017-07-28 | Disposition: A | Discharge status: Home / Self Care | Payer: Medicare Other | Source: Ambulatory Visit | Attending: Nephrology | Admitting: Nephrology

## 2017-07-28 VITALS — BP 130/57 | HR 70 | Temp 97.9°F | Resp 16

## 2017-07-28 DIAGNOSIS — D631 Anemia in chronic kidney disease: Secondary | ICD-10-CM | POA: Insufficient documentation

## 2017-07-28 DIAGNOSIS — N184 Chronic kidney disease, stage 4 (severe): Secondary | ICD-10-CM | POA: Diagnosis not present

## 2017-07-28 LAB — RENAL FUNCTION PANEL
Albumin: 3.8 g/dL (ref 3.5–5.0)
Anion gap: 12 (ref 5–15)
BUN: 81 mg/dL — ABNORMAL HIGH (ref 6–20)
CO2: 25 mmol/L (ref 22–32)
Calcium: 9.3 mg/dL (ref 8.9–10.3)
Chloride: 105 mmol/L (ref 101–111)
Creatinine, Ser: 3.23 mg/dL — ABNORMAL HIGH (ref 0.44–1.00)
GFR calc Af Amer: 15 mL/min — ABNORMAL LOW (ref >60)
GFR calc non Af Amer: 13 mL/min — ABNORMAL LOW (ref >60)
Glucose, Bld: 98 mg/dL (ref 65–99)
Phosphorus: 4.7 mg/dL — ABNORMAL HIGH (ref 2.5–4.6)
Potassium: 3.4 mmol/L — ABNORMAL LOW (ref 3.5–5.1)
Sodium: 142 mmol/L (ref 135–145)

## 2017-07-28 LAB — IRON AND TIBC
Iron: 79 ug/dL (ref 28–170)
Saturation Ratios: 29 % (ref 10.4–31.8)
TIBC: 273 ug/dL (ref 250–450)
UIBC: 194 ug/dL

## 2017-07-28 LAB — POCT HEMOGLOBIN-HEMACUE: Hemoglobin: 10.8 g/dL — ABNORMAL LOW (ref 12.0–15.0)

## 2017-07-28 LAB — FERRITIN: Ferritin: 374 ng/mL — ABNORMAL HIGH (ref 11–307)

## 2017-07-28 MED ORDER — EPOETIN ALFA 20000 UNIT/ML IJ SOLN
INTRAMUSCULAR | Status: AC
  Administered 2017-07-28: 20000 [IU] via SUBCUTANEOUS
  Filled 2017-07-28: qty 1

## 2017-07-28 MED ORDER — EPOETIN ALFA 20000 UNIT/ML IJ SOLN
20000.0000 [IU] | INTRAMUSCULAR | Status: DC
  Administered 2017-07-28: 20000 [IU] via SUBCUTANEOUS

## 2017-07-29 LAB — PTH, INTACT AND CALCIUM
Calcium, Total (PTH): 9.3 mg/dL (ref 8.7–10.3)
PTH: 256 pg/mL — ABNORMAL HIGH (ref 15–65)

## 2017-08-10 ENCOUNTER — Encounter (HOSPITAL_COMMUNITY): Payer: Medicare Other

## 2017-08-11 ENCOUNTER — Encounter (HOSPITAL_COMMUNITY)
Admission: RE | Admit: 2017-08-11 | Discharge: 2017-08-11 | Disposition: A | Discharge status: Home / Self Care | Payer: Medicare Other | Source: Ambulatory Visit | Attending: Nephrology | Admitting: Nephrology

## 2017-08-11 VITALS — BP 146/66 | HR 62 | Temp 98.0°F | Resp 20

## 2017-08-11 DIAGNOSIS — D631 Anemia in chronic kidney disease: Secondary | ICD-10-CM | POA: Insufficient documentation

## 2017-08-11 DIAGNOSIS — N184 Chronic kidney disease, stage 4 (severe): Secondary | ICD-10-CM | POA: Diagnosis not present

## 2017-08-11 LAB — POCT HEMOGLOBIN-HEMACUE: Hemoglobin: 10.5 g/dL — ABNORMAL LOW (ref 12.0–15.0)

## 2017-08-11 MED ORDER — EPOETIN ALFA 20000 UNIT/ML IJ SOLN
20000.0000 [IU] | INTRAMUSCULAR | Status: DC
  Administered 2017-08-11: 20000 [IU] via SUBCUTANEOUS

## 2017-08-11 MED ORDER — EPOETIN ALFA 20000 UNIT/ML IJ SOLN
INTRAMUSCULAR | Status: AC
  Administered 2017-08-11: 20000 [IU] via SUBCUTANEOUS
  Filled 2017-08-11: qty 1

## 2017-08-23 ENCOUNTER — Ambulatory Visit (HOSPITAL_COMMUNITY)
Admission: RE | Admit: 2017-08-23 | Discharge: 2017-08-23 | Disposition: A | Discharge status: Home / Self Care | Payer: Medicare Other | Source: Ambulatory Visit | Attending: Nephrology | Admitting: Nephrology

## 2017-08-23 VITALS — BP 119/65 | HR 44 | Temp 97.8°F | Resp 20

## 2017-08-23 DIAGNOSIS — D631 Anemia in chronic kidney disease: Secondary | ICD-10-CM | POA: Diagnosis present

## 2017-08-23 DIAGNOSIS — N184 Chronic kidney disease, stage 4 (severe): Secondary | ICD-10-CM | POA: Diagnosis present

## 2017-08-23 LAB — RENAL FUNCTION PANEL
Albumin: 3.8 g/dL (ref 3.5–5.0)
Anion gap: 11 (ref 5–15)
BUN: 80 mg/dL — ABNORMAL HIGH (ref 6–20)
CO2: 28 mmol/L (ref 22–32)
Calcium: 9.3 mg/dL (ref 8.9–10.3)
Chloride: 106 mmol/L (ref 101–111)
Creatinine, Ser: 3.34 mg/dL — ABNORMAL HIGH (ref 0.44–1.00)
GFR calc Af Amer: 14 mL/min — ABNORMAL LOW (ref >60)
GFR calc non Af Amer: 12 mL/min — ABNORMAL LOW (ref >60)
Glucose, Bld: 95 mg/dL (ref 65–99)
Phosphorus: 5.5 mg/dL — ABNORMAL HIGH (ref 2.5–4.6)
Potassium: 3.4 mmol/L — ABNORMAL LOW (ref 3.5–5.1)
Sodium: 145 mmol/L (ref 135–145)

## 2017-08-23 LAB — POCT HEMOGLOBIN-HEMACUE: Hemoglobin: 11.1 g/dL — ABNORMAL LOW (ref 12.0–15.0)

## 2017-08-23 LAB — FERRITIN: Ferritin: 304 ng/mL (ref 11–307)

## 2017-08-23 LAB — IRON AND TIBC
Iron: 62 ug/dL (ref 28–170)
Saturation Ratios: 22 % (ref 10.4–31.8)
TIBC: 279 ug/dL (ref 250–450)
UIBC: 217 ug/dL

## 2017-08-23 MED ORDER — EPOETIN ALFA 20000 UNIT/ML IJ SOLN
20000.0000 [IU] | INTRAMUSCULAR | Status: DC
  Administered 2017-08-23: 20000 [IU] via SUBCUTANEOUS

## 2017-08-23 MED ORDER — EPOETIN ALFA 20000 UNIT/ML IJ SOLN
INTRAMUSCULAR | Status: AC
  Administered 2017-08-23: 20000 [IU] via SUBCUTANEOUS
  Filled 2017-08-23: qty 1

## 2017-08-24 ENCOUNTER — Encounter (HOSPITAL_COMMUNITY): Payer: Medicare Other

## 2017-08-24 LAB — PTH, INTACT AND CALCIUM
Calcium, Total (PTH): 9.3 mg/dL (ref 8.7–10.3)
PTH: 207 pg/mL — ABNORMAL HIGH (ref 15–65)

## 2017-09-07 ENCOUNTER — Encounter (HOSPITAL_COMMUNITY)
Admission: RE | Admit: 2017-09-07 | Discharge: 2017-09-07 | Disposition: A | Discharge status: Home / Self Care | Payer: Medicare Other | Source: Ambulatory Visit | Attending: Nephrology | Admitting: Nephrology

## 2017-09-07 VITALS — BP 155/68 | HR 39 | Resp 20

## 2017-09-07 DIAGNOSIS — N184 Chronic kidney disease, stage 4 (severe): Secondary | ICD-10-CM

## 2017-09-07 LAB — POCT HEMOGLOBIN-HEMACUE: Hemoglobin: 12.5 g/dL (ref 12.0–15.0)

## 2017-09-07 MED ORDER — EPOETIN ALFA 20000 UNIT/ML IJ SOLN
INTRAMUSCULAR | Status: AC
  Filled 2017-09-07: qty 1

## 2017-09-07 MED ORDER — EPOETIN ALFA 20000 UNIT/ML IJ SOLN: 20000.0000 [IU] | INTRAMUSCULAR | Status: DC

## 2017-09-21 ENCOUNTER — Encounter (HOSPITAL_COMMUNITY): Payer: Medicare Other

## 2017-09-25 ENCOUNTER — Ambulatory Visit (HOSPITAL_COMMUNITY)
Admission: RE | Admit: 2017-09-25 | Discharge: 2017-09-25 | Disposition: A | Discharge status: Home / Self Care | Payer: Medicare Other | Source: Ambulatory Visit | Attending: Nephrology | Admitting: Nephrology

## 2017-09-25 VITALS — BP 139/74 | HR 47 | Temp 98.2°F | Resp 20

## 2017-09-25 DIAGNOSIS — N184 Chronic kidney disease, stage 4 (severe): Secondary | ICD-10-CM | POA: Diagnosis present

## 2017-09-25 LAB — RENAL FUNCTION PANEL
Albumin: 3.8 g/dL (ref 3.5–5.0)
Anion gap: 11 (ref 5–15)
BUN: 92 mg/dL — ABNORMAL HIGH (ref 6–20)
CO2: 27 mmol/L (ref 22–32)
Calcium: 9.2 mg/dL (ref 8.9–10.3)
Chloride: 103 mmol/L (ref 101–111)
Creatinine, Ser: 3.75 mg/dL — ABNORMAL HIGH (ref 0.44–1.00)
GFR calc Af Amer: 12 mL/min — ABNORMAL LOW (ref >60)
GFR calc non Af Amer: 10 mL/min — ABNORMAL LOW (ref >60)
Glucose, Bld: 120 mg/dL — ABNORMAL HIGH (ref 65–99)
Phosphorus: 5.8 mg/dL — ABNORMAL HIGH (ref 2.5–4.6)
Potassium: 3.7 mmol/L (ref 3.5–5.1)
Sodium: 141 mmol/L (ref 135–145)

## 2017-09-25 LAB — IRON AND TIBC
Iron: 102 ug/dL (ref 28–170)
Saturation Ratios: 36 % — ABNORMAL HIGH (ref 10.4–31.8)
TIBC: 286 ug/dL (ref 250–450)
UIBC: 184 ug/dL

## 2017-09-25 LAB — FERRITIN: Ferritin: 369 ng/mL — ABNORMAL HIGH (ref 11–307)

## 2017-09-25 LAB — POCT HEMOGLOBIN-HEMACUE: Hemoglobin: 10.8 g/dL — ABNORMAL LOW (ref 12.0–15.0)

## 2017-09-25 MED ORDER — EPOETIN ALFA 20000 UNIT/ML IJ SOLN: 20000.0000 [IU] | INTRAMUSCULAR | Status: DC

## 2017-09-25 MED ORDER — EPOETIN ALFA 20000 UNIT/ML IJ SOLN
INTRAMUSCULAR | Status: AC
  Administered 2017-09-25: 20000 [IU]
  Filled 2017-09-25: qty 1

## 2017-09-26 LAB — PTH, INTACT AND CALCIUM
Calcium, Total (PTH): 9.5 mg/dL (ref 8.7–10.3)
PTH: 259 pg/mL — ABNORMAL HIGH (ref 15–65)

## 2017-10-09 ENCOUNTER — Ambulatory Visit (HOSPITAL_COMMUNITY)
Admission: RE | Admit: 2017-10-09 | Discharge: 2017-10-09 | Disposition: A | Discharge status: Home / Self Care | Payer: Medicare Other | Source: Ambulatory Visit | Attending: Nephrology | Admitting: Nephrology

## 2017-10-09 VITALS — BP 144/51 | HR 61 | Temp 98.7°F | Ht 61.0 in | Wt 160.0 lb

## 2017-10-09 DIAGNOSIS — N184 Chronic kidney disease, stage 4 (severe): Secondary | ICD-10-CM | POA: Diagnosis present

## 2017-10-09 LAB — POCT HEMOGLOBIN-HEMACUE: Hemoglobin: 10.6 g/dL — ABNORMAL LOW (ref 12.0–15.0)

## 2017-10-09 MED ORDER — EPOETIN ALFA 20000 UNIT/ML IJ SOLN
20000.0000 [IU] | INTRAMUSCULAR | Status: DC
  Administered 2017-10-09: 20000 [IU] via SUBCUTANEOUS

## 2017-10-09 MED ORDER — EPOETIN ALFA 20000 UNIT/ML IJ SOLN
INTRAMUSCULAR | Status: AC
  Administered 2017-10-09: 20000 [IU] via SUBCUTANEOUS
  Filled 2017-10-09: qty 1

## 2017-10-23 ENCOUNTER — Encounter (HOSPITAL_COMMUNITY)
Admission: RE | Admit: 2017-10-23 | Discharge: 2017-10-23 | Disposition: A | Discharge status: Home / Self Care | Payer: Medicare Other | Source: Ambulatory Visit | Attending: Nephrology | Admitting: Nephrology

## 2017-10-23 VITALS — BP 128/86 | HR 63 | Temp 97.6°F

## 2017-10-23 DIAGNOSIS — N184 Chronic kidney disease, stage 4 (severe): Secondary | ICD-10-CM | POA: Diagnosis not present

## 2017-10-23 DIAGNOSIS — D631 Anemia in chronic kidney disease: Secondary | ICD-10-CM | POA: Diagnosis not present

## 2017-10-23 LAB — RENAL FUNCTION PANEL
Albumin: 3.6 g/dL (ref 3.5–5.0)
Anion gap: 11 (ref 5–15)
BUN: 77 mg/dL — ABNORMAL HIGH (ref 8–23)
CO2: 26 mmol/L (ref 22–32)
Calcium: 9.2 mg/dL (ref 8.9–10.3)
Chloride: 106 mmol/L (ref 98–111)
Creatinine, Ser: 3.62 mg/dL — ABNORMAL HIGH (ref 0.44–1.00)
GFR calc Af Amer: 13 mL/min — ABNORMAL LOW (ref >60)
GFR calc non Af Amer: 11 mL/min — ABNORMAL LOW (ref >60)
Glucose, Bld: 104 mg/dL — ABNORMAL HIGH (ref 70–99)
Phosphorus: 5 mg/dL — ABNORMAL HIGH (ref 2.5–4.6)
Potassium: 3.7 mmol/L (ref 3.5–5.1)
Sodium: 143 mmol/L (ref 135–145)

## 2017-10-23 LAB — POCT HEMOGLOBIN-HEMACUE: Hemoglobin: 11 g/dL — ABNORMAL LOW (ref 12.0–15.0)

## 2017-10-23 LAB — IRON AND TIBC
Iron: 74 ug/dL (ref 28–170)
Saturation Ratios: 26 % (ref 10.4–31.8)
TIBC: 283 ug/dL (ref 250–450)
UIBC: 209 ug/dL

## 2017-10-23 LAB — FERRITIN: Ferritin: 296 ng/mL (ref 11–307)

## 2017-10-23 MED ORDER — EPOETIN ALFA 20000 UNIT/ML IJ SOLN
20000.0000 [IU] | INTRAMUSCULAR | Status: DC
  Administered 2017-10-23: 20000 [IU] via SUBCUTANEOUS

## 2017-10-23 MED ORDER — EPOETIN ALFA 20000 UNIT/ML IJ SOLN
INTRAMUSCULAR | Status: AC
  Filled 2017-10-23: qty 1

## 2017-10-24 LAB — PTH, INTACT AND CALCIUM
Calcium, Total (PTH): 9 mg/dL (ref 8.7–10.3)
PTH: 437 pg/mL — ABNORMAL HIGH (ref 15–65)

## 2017-11-06 ENCOUNTER — Encounter (HOSPITAL_COMMUNITY): Payer: Medicare Other

## 2017-11-08 ENCOUNTER — Ambulatory Visit (HOSPITAL_COMMUNITY)
Admission: RE | Admit: 2017-11-08 | Discharge: 2017-11-08 | Disposition: A | Discharge status: Home / Self Care | Payer: Medicare Other | Source: Ambulatory Visit | Attending: Nephrology | Admitting: Nephrology

## 2017-11-08 VITALS — BP 127/65 | HR 54 | Temp 98.4°F | Ht 61.0 in | Wt 161.0 lb

## 2017-11-08 DIAGNOSIS — N184 Chronic kidney disease, stage 4 (severe): Secondary | ICD-10-CM | POA: Insufficient documentation

## 2017-11-08 DIAGNOSIS — D631 Anemia in chronic kidney disease: Secondary | ICD-10-CM | POA: Insufficient documentation

## 2017-11-08 LAB — POCT HEMOGLOBIN-HEMACUE: Hemoglobin: 11.1 g/dL — ABNORMAL LOW (ref 12.0–15.0)

## 2017-11-08 MED ORDER — EPOETIN ALFA 20000 UNIT/ML IJ SOLN
INTRAMUSCULAR | Status: AC
  Administered 2017-11-08: 20000 [IU] via SUBCUTANEOUS
  Filled 2017-11-08: qty 1

## 2017-11-08 MED ORDER — EPOETIN ALFA 20000 UNIT/ML IJ SOLN
20000.0000 [IU] | INTRAMUSCULAR | Status: DC
  Administered 2017-11-08: 20000 [IU] via SUBCUTANEOUS

## 2017-11-13 ENCOUNTER — Other Ambulatory Visit: Payer: Self-pay | Admitting: Gastroenterology

## 2017-11-13 DIAGNOSIS — K862 Cyst of pancreas: Secondary | ICD-10-CM

## 2017-11-22 ENCOUNTER — Ambulatory Visit (HOSPITAL_COMMUNITY)
Admission: RE | Admit: 2017-11-22 | Discharge: 2017-11-22 | Disposition: A | Discharge status: Home / Self Care | Payer: Medicare Other | Source: Ambulatory Visit | Attending: Nephrology | Admitting: Nephrology

## 2017-11-22 VITALS — BP 146/57 | HR 61 | Temp 97.7°F

## 2017-11-22 DIAGNOSIS — N184 Chronic kidney disease, stage 4 (severe): Secondary | ICD-10-CM | POA: Insufficient documentation

## 2017-11-22 DIAGNOSIS — D631 Anemia in chronic kidney disease: Secondary | ICD-10-CM | POA: Insufficient documentation

## 2017-11-22 LAB — POCT HEMOGLOBIN-HEMACUE: Hemoglobin: 11.9 g/dL — ABNORMAL LOW (ref 12.0–15.0)

## 2017-11-22 LAB — IRON AND TIBC
Iron: 74 ug/dL (ref 28–170)
Saturation Ratios: 25 % (ref 10.4–31.8)
TIBC: 301 ug/dL (ref 250–450)
UIBC: 227 ug/dL

## 2017-11-22 LAB — RENAL FUNCTION PANEL
Albumin: 3.7 g/dL (ref 3.5–5.0)
Anion gap: 9 (ref 5–15)
BUN: 84 mg/dL — ABNORMAL HIGH (ref 8–23)
CO2: 29 mmol/L (ref 22–32)
Calcium: 9.1 mg/dL (ref 8.9–10.3)
Chloride: 106 mmol/L (ref 98–111)
Creatinine, Ser: 3.55 mg/dL — ABNORMAL HIGH (ref 0.44–1.00)
GFR calc Af Amer: 13 mL/min — ABNORMAL LOW (ref >60)
GFR calc non Af Amer: 11 mL/min — ABNORMAL LOW (ref >60)
Glucose, Bld: 106 mg/dL — ABNORMAL HIGH (ref 70–99)
Phosphorus: 5.6 mg/dL — ABNORMAL HIGH (ref 2.5–4.6)
Potassium: 3.5 mmol/L (ref 3.5–5.1)
Sodium: 144 mmol/L (ref 135–145)

## 2017-11-22 LAB — FERRITIN: Ferritin: 243 ng/mL (ref 11–307)

## 2017-11-22 MED ORDER — EPOETIN ALFA 20000 UNIT/ML IJ SOLN
20000.0000 [IU] | INTRAMUSCULAR | Status: DC
  Administered 2017-11-22: 20000 [IU] via SUBCUTANEOUS

## 2017-11-22 MED ORDER — EPOETIN ALFA 20000 UNIT/ML IJ SOLN
INTRAMUSCULAR | Status: AC
  Administered 2017-11-22: 20000 [IU] via SUBCUTANEOUS
  Filled 2017-11-22: qty 1

## 2017-11-23 LAB — PTH, INTACT AND CALCIUM
Calcium, Total (PTH): 9 mg/dL (ref 8.7–10.3)
PTH: 298 pg/mL — ABNORMAL HIGH (ref 15–65)

## 2017-11-24 ENCOUNTER — Other Ambulatory Visit: Payer: Self-pay | Admitting: Gastroenterology

## 2017-11-24 ENCOUNTER — Ambulatory Visit
Admission: RE | Admit: 2017-11-24 | Discharge: 2017-11-24 | Disposition: A | Discharge status: Home / Self Care | Payer: Medicare Other | Source: Ambulatory Visit | Attending: Gastroenterology | Admitting: Gastroenterology

## 2017-11-24 DIAGNOSIS — K862 Cyst of pancreas: Secondary | ICD-10-CM

## 2017-12-06 ENCOUNTER — Ambulatory Visit (HOSPITAL_COMMUNITY)
Admission: RE | Admit: 2017-12-06 | Discharge: 2017-12-06 | Disposition: A | Discharge status: Home / Self Care | Payer: Medicare Other | Source: Ambulatory Visit | Attending: Nephrology | Admitting: Nephrology

## 2017-12-06 VITALS — BP 136/63 | HR 57

## 2017-12-06 DIAGNOSIS — Z79899 Other long term (current) drug therapy: Secondary | ICD-10-CM | POA: Insufficient documentation

## 2017-12-06 DIAGNOSIS — D631 Anemia in chronic kidney disease: Secondary | ICD-10-CM | POA: Diagnosis not present

## 2017-12-06 DIAGNOSIS — Z5181 Encounter for therapeutic drug level monitoring: Secondary | ICD-10-CM | POA: Insufficient documentation

## 2017-12-06 DIAGNOSIS — N184 Chronic kidney disease, stage 4 (severe): Secondary | ICD-10-CM | POA: Diagnosis not present

## 2017-12-06 LAB — POCT HEMOGLOBIN-HEMACUE: Hemoglobin: 11.9 g/dL — ABNORMAL LOW (ref 12.0–15.0)

## 2017-12-06 MED ORDER — EPOETIN ALFA 20000 UNIT/ML IJ SOLN
20000.0000 [IU] | INTRAMUSCULAR | Status: DC
  Administered 2017-12-06: 20000 [IU] via SUBCUTANEOUS

## 2017-12-06 MED ORDER — EPOETIN ALFA 20000 UNIT/ML IJ SOLN
INTRAMUSCULAR | Status: AC
  Administered 2017-12-06: 20000 [IU] via SUBCUTANEOUS
  Filled 2017-12-06: qty 1

## 2017-12-20 ENCOUNTER — Encounter (HOSPITAL_COMMUNITY)
Admission: RE | Admit: 2017-12-20 | Discharge: 2017-12-20 | Disposition: A | Discharge status: Home / Self Care | Payer: Medicare Other | Source: Ambulatory Visit | Attending: Nephrology | Admitting: Nephrology

## 2017-12-20 VITALS — BP 130/65 | HR 75 | Temp 98.1°F | Resp 20

## 2017-12-20 DIAGNOSIS — D631 Anemia in chronic kidney disease: Secondary | ICD-10-CM | POA: Diagnosis not present

## 2017-12-20 DIAGNOSIS — N184 Chronic kidney disease, stage 4 (severe): Secondary | ICD-10-CM | POA: Insufficient documentation

## 2017-12-20 LAB — IRON AND TIBC
Iron: 70 ug/dL (ref 28–170)
Saturation Ratios: 24 % (ref 10.4–31.8)
TIBC: 293 ug/dL (ref 250–450)
UIBC: 223 ug/dL

## 2017-12-20 LAB — RENAL FUNCTION PANEL
Albumin: 3.8 g/dL (ref 3.5–5.0)
Anion gap: 12 (ref 5–15)
BUN: 88 mg/dL — ABNORMAL HIGH (ref 8–23)
CO2: 25 mmol/L (ref 22–32)
Calcium: 9.1 mg/dL (ref 8.9–10.3)
Chloride: 107 mmol/L (ref 98–111)
Creatinine, Ser: 3.75 mg/dL — ABNORMAL HIGH (ref 0.44–1.00)
GFR calc Af Amer: 12 mL/min — ABNORMAL LOW (ref >60)
GFR calc non Af Amer: 10 mL/min — ABNORMAL LOW (ref >60)
Glucose, Bld: 106 mg/dL — ABNORMAL HIGH (ref 70–99)
Phosphorus: 5.7 mg/dL — ABNORMAL HIGH (ref 2.5–4.6)
Potassium: 3.5 mmol/L (ref 3.5–5.1)
Sodium: 144 mmol/L (ref 135–145)

## 2017-12-20 LAB — POCT HEMOGLOBIN-HEMACUE: Hemoglobin: 11.9 g/dL — ABNORMAL LOW (ref 12.0–15.0)

## 2017-12-20 LAB — FERRITIN: Ferritin: 199 ng/mL (ref 11–307)

## 2017-12-20 MED ORDER — EPOETIN ALFA 20000 UNIT/ML IJ SOLN
20000.0000 [IU] | INTRAMUSCULAR | Status: DC
  Administered 2017-12-20: 20000 [IU] via SUBCUTANEOUS

## 2017-12-20 MED ORDER — EPOETIN ALFA 20000 UNIT/ML IJ SOLN
INTRAMUSCULAR | Status: AC
  Administered 2017-12-20: 20000 [IU] via SUBCUTANEOUS
  Filled 2017-12-20: qty 1

## 2017-12-21 LAB — PTH, INTACT AND CALCIUM
Calcium, Total (PTH): 9.1 mg/dL (ref 8.7–10.3)
PTH: 528 pg/mL — ABNORMAL HIGH (ref 15–65)

## 2017-12-26 ENCOUNTER — Emergency Department (HOSPITAL_COMMUNITY): Payer: Medicare Other

## 2017-12-26 ENCOUNTER — Emergency Department (HOSPITAL_COMMUNITY)
Admission: EM | Admit: 2017-12-26 | Discharge: 2017-12-27 | Disposition: A | Discharge status: Home / Self Care | Payer: Medicare Other | Attending: Emergency Medicine | Admitting: Emergency Medicine

## 2017-12-26 ENCOUNTER — Encounter (HOSPITAL_COMMUNITY): Payer: Self-pay | Admitting: Emergency Medicine

## 2017-12-26 ENCOUNTER — Other Ambulatory Visit: Payer: Self-pay

## 2017-12-26 DIAGNOSIS — E7800 Pure hypercholesterolemia, unspecified: Secondary | ICD-10-CM | POA: Diagnosis not present

## 2017-12-26 DIAGNOSIS — Y998 Other external cause status: Secondary | ICD-10-CM | POA: Insufficient documentation

## 2017-12-26 DIAGNOSIS — Z79899 Other long term (current) drug therapy: Secondary | ICD-10-CM | POA: Insufficient documentation

## 2017-12-26 DIAGNOSIS — N184 Chronic kidney disease, stage 4 (severe): Secondary | ICD-10-CM | POA: Diagnosis not present

## 2017-12-26 DIAGNOSIS — E1122 Type 2 diabetes mellitus with diabetic chronic kidney disease: Secondary | ICD-10-CM | POA: Diagnosis not present

## 2017-12-26 DIAGNOSIS — Y92511 Restaurant or cafe as the place of occurrence of the external cause: Secondary | ICD-10-CM | POA: Diagnosis not present

## 2017-12-26 DIAGNOSIS — I5032 Chronic diastolic (congestive) heart failure: Secondary | ICD-10-CM | POA: Diagnosis not present

## 2017-12-26 DIAGNOSIS — I130 Hypertensive heart and chronic kidney disease with heart failure and stage 1 through stage 4 chronic kidney disease, or unspecified chronic kidney disease: Secondary | ICD-10-CM | POA: Diagnosis not present

## 2017-12-26 DIAGNOSIS — Y939 Activity, unspecified: Secondary | ICD-10-CM | POA: Insufficient documentation

## 2017-12-26 DIAGNOSIS — W010XXA Fall on same level from slipping, tripping and stumbling without subsequent striking against object, initial encounter: Secondary | ICD-10-CM | POA: Insufficient documentation

## 2017-12-26 DIAGNOSIS — S62316A Displaced fracture of base of fifth metacarpal bone, right hand, initial encounter for closed fracture: Secondary | ICD-10-CM | POA: Insufficient documentation

## 2017-12-26 DIAGNOSIS — Z7982 Long term (current) use of aspirin: Secondary | ICD-10-CM | POA: Insufficient documentation

## 2017-12-26 DIAGNOSIS — S0990XA Unspecified injury of head, initial encounter: Secondary | ICD-10-CM | POA: Diagnosis present

## 2017-12-26 DIAGNOSIS — S0181XA Laceration without foreign body of other part of head, initial encounter: Secondary | ICD-10-CM | POA: Insufficient documentation

## 2017-12-26 DIAGNOSIS — W19XXXA Unspecified fall, initial encounter: Secondary | ICD-10-CM

## 2017-12-26 MED ORDER — IOPAMIDOL (ISOVUE-300) INJECTION 61%
INTRAVENOUS | Status: AC
  Filled 2017-12-26: qty 30

## 2017-12-26 NOTE — ED Provider Notes (Signed)
MSE was initiated and I personally evaluated the patient and placed orders (if any) at  8:52 PM on December 26, 2017.  The patient appears stable so that the remainder of the MSE may be completed by another provider.  Patient placed in Quick Look pathway, seen and evaluated   Chief Complaint: fall  HPI:   82yo F presents to ED for evaluation of mechanical fall that occurred PTA. She was walking outside of the [place] when another family member accidentally stepped on her heel, causing her to fall. The fall was witnessed. Daughter states that she fell face first onto the ground on her R side. EMS helped her up. Has not ambulated since then. Denies LOC. Reports pain in R side of face, R wrist, R knee and L ankle. Reports daily ASA 81mg  use. Denies headache, blurry vision, numbness in arms or legs, chest pain, back pain, neck pain.  ROS: arthralgias (one)  Physical Exam:   Gen: No distress  Neuro: Awake and Alert  Skin: Warm    Focused Exam: abrasions and TTP of R wrist. TTP of R knee and L ankle with no changes in ROM noted. No C, T, L spine TTP. PERRL. AAOx3. No facial asymmetry noted.   Initiation of care has begun. The patient has been counseled on the process, plan, and necessity for staying for the completion/evaluation, and the remainder of the medical screening examination    Delia Heady, PA-C 12/26/17 2056    Ripley Fraise, MD 12/27/17 [phone]

## 2017-12-26 NOTE — ED Triage Notes (Signed)
Patient arrives via [facility] after a fall at [facility].  Patient fell onto right side, has a hematoma to right wrist, swelling to right face, lacerations to lips, abrasion to right knee, swelling to left ankle.  CBG of 224 VS of 170/62, 80's, 18.  Patient is CAOx4.  GCS 15.

## 2017-12-27 MED ORDER — HYDROCODONE-ACETAMINOPHEN 5-325 MG PO TABS: 1.0000 | ORAL_TABLET | Freq: Four times a day (QID) | ORAL | 0 refills | Status: DC | PRN

## 2017-12-27 MED ORDER — ACETAMINOPHEN 325 MG PO TABS
650.0000 mg | ORAL_TABLET | Freq: Once | ORAL | Status: AC
  Administered 2017-12-27: 650 mg via ORAL
  Filled 2017-12-27: qty 2

## 2017-12-27 NOTE — Progress Notes (Signed)
Orthopedic Tech Progress Note Patient Details:  Doris Howell 03/07/1936 [id number]  Ortho Devices Type of Ortho Device: Arm sling, Ulna gutter splint Ortho Device/Splint Location: rue Ortho Device/Splint Interventions: Ordered, Application, Adjustment   Post Interventions Patient Tolerated: Well Instructions Provided: Care of device, Adjustment of device   [name] 12/27/2017, 2:39 AM

## 2017-12-27 NOTE — ED Notes (Signed)
ED Provider at bedside. 

## 2017-12-27 NOTE — ED Notes (Signed)
Pt refused discharge vitals 

## 2017-12-27 NOTE — ED Notes (Signed)
Pt ambulated in hallway with use of her cane and only standby assistance from this RN. MD aware and at bedside.

## 2017-12-27 NOTE — ED Notes (Signed)
Patient verbalizes understanding of discharge instructions. Opportunity for questioning and answers were provided. Armband removed by staff, pt discharged from ED in wheelchair.  

## 2017-12-27 NOTE — ED Provider Notes (Signed)
[facility] EMERGENCY DEPARTMENT Provider Note   CSN: [id number] Arrival date & time: 12/26/17  2041     History   Chief Complaint Chief Complaint  Patient presents with  . Fall    HPI Doris Howell is a 82 y.o. female.  The history is provided by the patient and a relative.  Fall  This is a new problem. Episode onset: just prior to arrival. The problem occurs constantly. The problem has not changed since onset.Pertinent negatives include no chest pain and no headaches. The symptoms are aggravated by walking. The symptoms are relieved by rest.   Patient presents after mechanical fall.  She was at a restaurant with her family when she accidentally stepped wrong and fell.  She did hit her face on the ground, but no LOC.  She reports facial pain, wrist pain, knee and ankle pain. No other acute complaints at this time. Past Medical History:  Diagnosis Date  . CAP (community acquired pneumonia) 01/09/2014   "first time I've ever had it"  . Diastolic congestive heart failure ([place])   . GERD (gastroesophageal reflux disease)   . High cholesterol   . History of gout   . Hypertension   . Kidney stones   . Type II diabetes mellitus with stage 4 chronic kidney disease [facility])     Patient Active Problem List   Diagnosis Date Noted  . Pulmonary hypertension ([place]) 10/13/2016  . Left bundle branch block 12/22/2014  . Renal failure, acute on chronic (HCC)   . Pleural effusion on right   . Lung infiltrate   . Acute respiratory failure, unspecified whether with hypoxia or hypercapnia ([place])   . CHF exacerbation ([place])   . Acute on chronic renal failure ([place])   . Acute exacerbation of CHF (congestive heart failure) ([place]) 08/28/2014  . Acute diastolic CHF (congestive heart failure) ([place]) 08/28/2014  . Chronic kidney disease (CKD), stage IV (severe) ([place])   . Acute on chronic combined systolic and diastolic CHF (congestive heart failure) ([place])   . Sleep apnea  07/29/2014  . Obesity (BMI 37) 04/14/2014  . Bradycardia- beta blocker decreased 04/14/2014  . Pulmonary infiltrates   . Diastolic CHF, acute on chronic (HCC) 04/11/2014  . Type 2 diabetes mellitus with stage 4 chronic kidney disease ([place]) 04/11/2014  . Essential hypertension 04/11/2014  . Gout 04/11/2014  . HCAP (healthcare-associated pneumonia)   . Hyperkalemia 02/08/2014  . Chronic diastolic heart failure ([place]) 02/05/2014  . CKD (chronic kidney disease), stage IV ([place]) 02/05/2014  . CAP (community acquired pneumonia) 01/09/2014  . Acute respiratory failure with hypoxia ([place]) 01/09/2014  . Anemia 01/09/2014  . AKI (acute kidney injury) ([place]) 01/09/2014    Past Surgical History:  Procedure Laterality Date  . ABDOMINAL HYSTERECTOMY  ~ 1974  . CATARACT EXTRACTION W/ INTRAOCULAR LENS  IMPLANT, BILATERAL Bilateral 2014  . COLONOSCOPY WITH PROPOFOL N/A 10/23/2012   Procedure: COLONOSCOPY WITH PROPOFOL;  Surgeon: [name], MD;  Location: [facility] ENDOSCOPY;  Service: Endoscopy;  Laterality: N/A;  . KIDNEY STONE SURGERY  10-05-12   "cut me on the side"  . LITHOTRIPSY  1980's?  . RIGHT HEART CATHETERIZATION N/A 04/15/2014   Procedure: RIGHT HEART CATH;  Surgeon: Sanda Klein, MD;  Location: [place] CATH LAB;  Service: Cardiovascular;  Laterality: N/A;     OB History   None      Home Medications    Prior to Admission medications   Medication Sig Start Date End Date Taking? Authorizing  Provider  albuterol (PROVENTIL HFA;VENTOLIN HFA) 108 (90 BASE) MCG/ACT inhaler Inhale 2 puffs into the lungs every 6 (six) hours as needed for wheezing or shortness of breath. 01/13/14   [name], MD  allopurinol (ZYLOPRIM) 100 MG tablet Take 100 mg by mouth 2 (two) times daily.    [provider]  aspirin EC 81 MG tablet Take 81 mg by mouth daily.    [provider]  carvedilol (COREG) 12.5 MG tablet Take 12.5 mg by mouth 2 (two) times daily with a meal.    [provider]    hydrALAZINE (APRESOLINE) 25 MG tablet Take 25 mg by mouth 2 (two) times daily.     [provider]  latanoprost (XALATAN) 0.005 % ophthalmic solution Place 1 drop into both eyes at bedtime.    [provider]  torsemide (DEMADEX) 20 MG tablet Take 1-2 tablets (20-40 mg total) by mouth 2 (two) times daily. Take 2 tabs daily at 8 AM and 1 tab daily after lunch at 1 PM. Patient taking differently: Take 20-40 mg See admin instructions by mouth. Take 2 tabs daily at 8 AM and 1 tab qhs 09/11/14   Hongalgi, Lenis Dickinson, MD  traMADol (ULTRAM) 50 MG tablet Take 50 mg by mouth at bedtime.    [provider]  Vitamin D, Cholecalciferol, 400 UNITS CAPS Take 2 capsules by mouth daily.    [provider]    Family History Family History  Problem Relation Age of Onset  . Hypertension Mother   . Diabetes Brother   . CAD Neg Hx     Social History Social History   Tobacco Use  . Smoking status: Never Smoker  . Smokeless tobacco: Never Used  Substance Use Topics  . Alcohol use: No  . Drug use: No     Allergies   Simvastatin and Codeine   Review of Systems Review of Systems  Cardiovascular: Negative for chest pain.  Musculoskeletal: Positive for arthralgias.  Neurological: Negative for headaches.  All other systems reviewed and are negative.    Physical Exam Updated Vital Signs BP (!) 145/68 (BP Location: Right Arm)   Pulse 75   Temp 97.9 F (36.6 C) (Oral)   Resp 17   SpO2 96%   Physical Exam CONSTITUTIONAL: Well developed/well nourished HEAD:bruise to  Forehead EYES: EOMI/PERRL ENMT: Mucous membranes moist, 2 small lacerations under lower lip. No nasal trauma, no dental trauma, midface stable.  No septal hematoma NECK: supple no meningeal signs SPINE/BACK:entire spine nontender, no bruising/crepitance/stepoffs noted to spine CV: S1/S2 noted, no murmurs/rubs/gallops noted Chest-no tenderness LUNGS: Lungs are clear to auscultation bilaterally,  no apparent distress ABDOMEN: soft, nontender, no rebound or guarding, bowel sounds noted throughout abdomen GU:no cva tenderness NEURO: Pt is awake/alert/appropriate, moves all extremitiesx4.  No facial droop.   EXTREMITIES: pulses normal/equal, full ROM, tenderness and swelling to right hand.  Fingers are nontender to palpation with full range of motion, no deformities.  Pelvis is stable.  Right knee tenderness noted. No other significant tenderness or deformities to extremities SKIN: warm, color normal PSYCH: no abnormalities of mood noted, alert and oriented to situation   ED Treatments / Results  Labs (all labs ordered are listed, but only abnormal results are displayed) Labs Reviewed - No data to display  EKG None  Radiology Dg Shoulder Right  Result Date: 12/26/2017 CLINICAL DATA:  Fall with right shoulder pain EXAM: RIGHT SHOULDER - 2+ VIEW COMPARISON:  None. FINDINGS: No fracture or dislocation. Mild  AC joint degenerative change. Slightly high-riding humeral head suggesting rotator cuff disease IMPRESSION: No acute osseous abnormality Electronically Signed   By: Donavan Foil M.D.   On: 12/26/2017 21:55   Dg Wrist Complete Right  Result Date: 12/26/2017 CLINICAL DATA:  Right wrist pain after fall this evening. EXAM: RIGHT WRIST - COMPLETE 3+ VIEW COMPARISON:  None. FINDINGS: There soft tissue swelling along the ulnar aspect of the included hand. A slightly comminuted fracture at the base of the right fifth metacarpal is identified with radial displacement of a small fracture fragment. Otherwise, there is osteoarthritic joint space narrowing at the base of the thumb metacarpal, triscaphe joint of the wrist and erosive change of the proximal pole of the scaphoid. The distal radius and ulna are unremarkable. There is mild soft tissue swelling of the distal forearm. IMPRESSION: An acute, closed, comminuted fracture at the base of the right fifth metacarpal with radially displaced  fracture fragment is noted. No joint dislocation. Associated soft tissue swelling. 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 Erosive change of the proximal pole of the scaphoid. Electronically Signed   By: Ashley Royalty M.D.   On: 12/26/2017 22:07   Dg Ankle Complete Left  Result Date: 12/26/2017 CLINICAL DATA:  Pain after fall EXAM: LEFT ANKLE COMPLETE - 3+ VIEW COMPARISON:  None. FINDINGS: No definite malalignment. Slightly indistinct appearance of the anterior cortex of the talus. The mortise is symmetric. There is diffuse soft tissue swelling. Small plantar calcaneal spur IMPRESSION: Indistinct appearance of the anterior cortex of talus is questionable for a fracture. Electronically Signed   By: Donavan Foil M.D.   On: 12/26/2017 22:01   Ct Head Wo Contrast  Result Date: 12/26/2017 CLINICAL DATA:  Tripped and fell swelling to the right side of face EXAM: CT HEAD WITHOUT CONTRAST CT MAXILLOFACIAL WITHOUT CONTRAST TECHNIQUE: Multidetector CT imaging of the head and maxillofacial structures were performed using the standard protocol without intravenous contrast. Multiplanar CT image reconstructions of the maxillofacial structures were also generated. COMPARISON:  02/25/2017, 07/23/2008 FINDINGS: CT HEAD FINDINGS Brain: No acute territorial infarction, hemorrhage or intracranial mass is visualized. Old lacunar infarct in the right basal ganglia. Old left cerebellar infarct. Moderate to marked small vessel ischemic changes of the white matter. Mild atrophy. Stable ventricle size. Vascular: No hyperdense vessels. Vertebral and carotid vascular calcification Skull: Normal. Negative for fracture or focal lesion. Other: None CT MAXILLOFACIAL FINDINGS Osseous: No acute nasal bone fracture. Bilateral mandibular heads are normally position. No mandibular fracture. Pterygoid plates and zygomatic arches are intact. Orbits: Negative. No traumatic or inflammatory finding. Sinuses: Opacification  of the ethmoid and maxillary sinuses with slightly dense secretions in the right maxillary sinus. No sinus wall fracture Soft tissues: Mild right facial soft tissue swelling IMPRESSION: 1. No CT evidence for acute intracranial abnormality. Atrophy and small vessel ischemic changes of the white matter. Chronic infarcts in the left cerebellum and right basal ganglia 2. No acute facial bone fracture. 3. Sinus disease with hyperdense secretions or hemorrhage in the right maxillary sinus. No sinus wall fracture is seen. Electronically Signed   By: Donavan Foil M.D.   On: 12/26/2017 22:26   Dg Knee Complete 4 Views Right  Result Date: 12/26/2017 CLINICAL DATA:  Pain post fall EXAM: RIGHT KNEE - COMPLETE 4+ VIEW COMPARISON:  None. FINDINGS: No definite acute displaced fracture or malalignment. Corticated ossific density inferior pole of patella with sclerotic margin, possible remote injury. Mild medial, lateral and patellofemoral degenerative changes.  Vascular calcification. IMPRESSION: 1. Ossified density inferior pole of patella, may represent prominent tendinous calcification versus old fracture deformity. No definite acute osseous abnormality is seen Electronically Signed   By: Donavan Foil M.D.   On: 12/26/2017 21:57   Ct Maxillofacial Wo Contrast  Result Date: 12/26/2017 CLINICAL DATA:  Tripped and fell swelling to the right side of face EXAM: CT HEAD WITHOUT CONTRAST CT MAXILLOFACIAL WITHOUT CONTRAST TECHNIQUE: Multidetector CT imaging of the head and maxillofacial structures were performed using the standard protocol without intravenous contrast. Multiplanar CT image reconstructions of the maxillofacial structures were also generated. COMPARISON:  02/25/2017, 07/23/2008 FINDINGS: CT HEAD FINDINGS Brain: No acute territorial infarction, hemorrhage or intracranial mass is visualized. Old lacunar infarct in the right basal ganglia. Old left cerebellar infarct. Moderate to marked small vessel ischemic changes  of the white matter. Mild atrophy. Stable ventricle size. Vascular: No hyperdense vessels. Vertebral and carotid vascular calcification Skull: Normal. Negative for fracture or focal lesion. Other: None CT MAXILLOFACIAL FINDINGS Osseous: No acute nasal bone fracture. Bilateral mandibular heads are normally position. No mandibular fracture. Pterygoid plates and zygomatic arches are intact. Orbits: Negative. No traumatic or inflammatory finding. Sinuses: Opacification of the ethmoid and maxillary sinuses with slightly dense secretions in the right maxillary sinus. No sinus wall fracture Soft tissues: Mild right facial soft tissue swelling IMPRESSION: 1. No CT evidence for acute intracranial abnormality. Atrophy and small vessel ischemic changes of the white matter. Chronic infarcts in the left cerebellum and right basal ganglia 2. No acute facial bone fracture. 3. Sinus disease with hyperdense secretions or hemorrhage in the right maxillary sinus. No sinus wall fracture is seen. Electronically Signed   By: Donavan Foil M.D.   On: 12/26/2017 22:26    Procedures Procedures   Medications Ordered in ED Medications  iopamidol (ISOVUE-300) 61 % injection (has no administration in time range)  acetaminophen (TYLENOL) tablet 650 mg (has no administration in time range)   SPLINT APPLICATION Date/Time: 1:82 AM Authorized by: Sharyon Cable Consent: Verbal consent obtained. Risks and benefits: risks, benefits and alternatives were discussed Consent given by: patient Splint applied by: orthopedic technician Location details: right hand Splint type: ulnar gutter Supplies used: ortho glass Post-procedure: The splinted body part was neurovascularly unchanged following the procedure. Patient tolerance: Patient tolerated the procedure well with no immediate complications.    LACERATION REPAIR Performed by: Sharyon Cable Consent: Verbal consent obtained. Risks and benefits: risks, benefits and  alternatives were discussed Patient identity confirmed: provided demographic data Time out performed prior to procedure Prepped and Draped in normal sterile fashion Wound explored Laceration Location: face Laceration Length: 0.5 cm Amount of cleaning: standard Skin closure: simple Number of sutures or staples: tissue adhesive Technique: adhesive Patient tolerance: Patient tolerated the procedure well with no immediate complications.     Initial Impression / Assessment and Plan / ED Course  I have reviewed the triage vital signs and the nursing notes.  Pertinent  imaging results that were available during my care of the patient were reviewed by me and considered in my medical decision making (see chart for details). Narcotic database reviewed and considered in decision making     2:15 AM  She presents after mechanical fall.  She had 2 small lacerations to her lower face, they were not through and through.  These were easily repaired with Dermabond.  She has a right fifth metacarpal fracture, ulnar gutter ordered. SHe will be referred to hand surgery. 2:53 AM  Patient  improved.  She can ambulate.  She feels comfortable with the splint in place.  Will refer to hand surgery.  No other signs of acute traumatic injury. Final Clinical Impressions(s) / ED Diagnoses   Final diagnoses:  Fall, initial encounter  Facial laceration, initial encounter  Closed displaced fracture of base of fifth metacarpal bone of right hand, initial encounter    ED Discharge Orders    None       Ripley Fraise, MD 12/27/17 [phone]

## 2018-01-03 ENCOUNTER — Ambulatory Visit (HOSPITAL_COMMUNITY)
Admission: RE | Admit: 2018-01-03 | Discharge: 2018-01-03 | Disposition: A | Discharge status: Home / Self Care | Payer: Medicare Other | Source: Ambulatory Visit | Attending: Nephrology | Admitting: Nephrology

## 2018-01-03 VITALS — BP 155/84 | HR 68 | Resp 18

## 2018-01-03 DIAGNOSIS — N184 Chronic kidney disease, stage 4 (severe): Secondary | ICD-10-CM | POA: Insufficient documentation

## 2018-01-03 DIAGNOSIS — D631 Anemia in chronic kidney disease: Secondary | ICD-10-CM | POA: Insufficient documentation

## 2018-01-03 LAB — POCT HEMOGLOBIN-HEMACUE: Hemoglobin: 12.3 g/dL (ref 12.0–15.0)

## 2018-01-03 MED ORDER — EPOETIN ALFA 20000 UNIT/ML IJ SOLN: 20000.0000 [IU] | INTRAMUSCULAR | Status: DC

## 2018-01-04 DIAGNOSIS — M79641 Pain in right hand: Secondary | ICD-10-CM | POA: Insufficient documentation

## 2018-01-17 ENCOUNTER — Ambulatory Visit (HOSPITAL_COMMUNITY)
Admission: RE | Admit: 2018-01-17 | Discharge: 2018-01-17 | Disposition: A | Discharge status: Home / Self Care | Payer: Medicare Other | Source: Ambulatory Visit | Attending: Nephrology | Admitting: Nephrology

## 2018-01-17 VITALS — BP 139/50 | HR 64 | Temp 97.7°F | Resp 20

## 2018-01-17 DIAGNOSIS — N184 Chronic kidney disease, stage 4 (severe): Secondary | ICD-10-CM | POA: Insufficient documentation

## 2018-01-17 DIAGNOSIS — D631 Anemia in chronic kidney disease: Secondary | ICD-10-CM | POA: Insufficient documentation

## 2018-01-17 LAB — RENAL FUNCTION PANEL
Albumin: 4 g/dL (ref 3.5–5.0)
Anion gap: 11 (ref 5–15)
BUN: 67 mg/dL — ABNORMAL HIGH (ref 8–23)
CO2: 27 mmol/L (ref 22–32)
Calcium: 9.6 mg/dL (ref 8.9–10.3)
Chloride: 104 mmol/L (ref 98–111)
Creatinine, Ser: 3.62 mg/dL — ABNORMAL HIGH (ref 0.44–1.00)
GFR calc Af Amer: 13 mL/min — ABNORMAL LOW (ref >60)
GFR calc non Af Amer: 11 mL/min — ABNORMAL LOW (ref >60)
Glucose, Bld: 100 mg/dL — ABNORMAL HIGH (ref 70–99)
Phosphorus: 4.9 mg/dL — ABNORMAL HIGH (ref 2.5–4.6)
Potassium: 3.8 mmol/L (ref 3.5–5.1)
Sodium: 142 mmol/L (ref 135–145)

## 2018-01-17 LAB — IRON AND TIBC
Iron: 69 ug/dL (ref 28–170)
Saturation Ratios: 24 % (ref 10.4–31.8)
TIBC: 293 ug/dL (ref 250–450)
UIBC: 224 ug/dL

## 2018-01-17 LAB — FERRITIN: Ferritin: 343 ng/mL — ABNORMAL HIGH (ref 11–307)

## 2018-01-17 LAB — POCT HEMOGLOBIN-HEMACUE: Hemoglobin: 11.1 g/dL — ABNORMAL LOW (ref 12.0–15.0)

## 2018-01-17 MED ORDER — EPOETIN ALFA 20000 UNIT/ML IJ SOLN
20000.0000 [IU] | INTRAMUSCULAR | Status: DC
  Administered 2018-01-17: 20000 [IU] via SUBCUTANEOUS

## 2018-01-17 MED ORDER — EPOETIN ALFA 20000 UNIT/ML IJ SOLN
INTRAMUSCULAR | Status: AC
  Administered 2018-01-17: 20000 [IU] via SUBCUTANEOUS
  Filled 2018-01-17: qty 1

## 2018-01-18 LAB — PTH, INTACT AND CALCIUM
Calcium, Total (PTH): 9.6 mg/dL (ref 8.7–10.3)
PTH: 290 pg/mL — ABNORMAL HIGH (ref 15–65)

## 2018-01-31 ENCOUNTER — Ambulatory Visit (HOSPITAL_COMMUNITY)
Admission: RE | Admit: 2018-01-31 | Discharge: 2018-01-31 | Disposition: A | Discharge status: Home / Self Care | Payer: Medicare Other | Source: Ambulatory Visit | Attending: Nephrology | Admitting: Nephrology

## 2018-01-31 VITALS — BP 141/71 | HR 95 | Temp 97.6°F | Resp 20

## 2018-01-31 DIAGNOSIS — D631 Anemia in chronic kidney disease: Secondary | ICD-10-CM | POA: Diagnosis present

## 2018-01-31 DIAGNOSIS — N184 Chronic kidney disease, stage 4 (severe): Secondary | ICD-10-CM | POA: Diagnosis not present

## 2018-01-31 LAB — POCT HEMOGLOBIN-HEMACUE: Hemoglobin: 11.1 g/dL — ABNORMAL LOW (ref 12.0–15.0)

## 2018-01-31 MED ORDER — EPOETIN ALFA 20000 UNIT/ML IJ SOLN
INTRAMUSCULAR | Status: AC
  Administered 2018-01-31: 20000 [IU] via SUBCUTANEOUS
  Filled 2018-01-31: qty 1

## 2018-01-31 MED ORDER — EPOETIN ALFA 20000 UNIT/ML IJ SOLN
20000.0000 [IU] | INTRAMUSCULAR | Status: DC
  Administered 2018-01-31: 20000 [IU] via SUBCUTANEOUS

## 2018-02-14 ENCOUNTER — Ambulatory Visit (HOSPITAL_COMMUNITY)
Admission: RE | Admit: 2018-02-14 | Discharge: 2018-02-14 | Disposition: A | Discharge status: Home / Self Care | Payer: Medicare Other | Source: Ambulatory Visit | Attending: Nephrology | Admitting: Nephrology

## 2018-02-14 VITALS — BP 129/66 | HR 46 | Temp 98.1°F | Resp 20

## 2018-02-14 DIAGNOSIS — N184 Chronic kidney disease, stage 4 (severe): Secondary | ICD-10-CM | POA: Diagnosis not present

## 2018-02-14 LAB — RENAL FUNCTION PANEL
Albumin: 3.6 g/dL (ref 3.5–5.0)
Anion gap: 11 (ref 5–15)
BUN: 79 mg/dL — ABNORMAL HIGH (ref 8–23)
CO2: 24 mmol/L (ref 22–32)
Calcium: 9 mg/dL (ref 8.9–10.3)
Chloride: 106 mmol/L (ref 98–111)
Creatinine, Ser: 3.45 mg/dL — ABNORMAL HIGH (ref 0.44–1.00)
GFR calc Af Amer: 13 mL/min — ABNORMAL LOW (ref >60)
GFR calc non Af Amer: 12 mL/min — ABNORMAL LOW (ref >60)
Glucose, Bld: 89 mg/dL (ref 70–99)
Phosphorus: 5 mg/dL — ABNORMAL HIGH (ref 2.5–4.6)
Potassium: 3.4 mmol/L — ABNORMAL LOW (ref 3.5–5.1)
Sodium: 141 mmol/L (ref 135–145)

## 2018-02-14 LAB — FERRITIN: Ferritin: 241 ng/mL (ref 11–307)

## 2018-02-14 LAB — IRON AND TIBC
Iron: 41 ug/dL (ref 28–170)
Saturation Ratios: 15 % (ref 10.4–31.8)
TIBC: 272 ug/dL (ref 250–450)
UIBC: 231 ug/dL

## 2018-02-14 LAB — POCT HEMOGLOBIN-HEMACUE: Hemoglobin: 11.4 g/dL — ABNORMAL LOW (ref 12.0–15.0)

## 2018-02-14 MED ORDER — EPOETIN ALFA 20000 UNIT/ML IJ SOLN
INTRAMUSCULAR | Status: AC
  Administered 2018-02-14: 20000 [IU] via SUBCUTANEOUS
  Filled 2018-02-14: qty 1

## 2018-02-14 MED ORDER — EPOETIN ALFA 20000 UNIT/ML IJ SOLN
20000.0000 [IU] | INTRAMUSCULAR | Status: DC
  Administered 2018-02-14: 20000 [IU] via SUBCUTANEOUS

## 2018-02-15 DIAGNOSIS — S62609A Fracture of unspecified phalanx of unspecified finger, initial encounter for closed fracture: Secondary | ICD-10-CM | POA: Insufficient documentation

## 2018-02-15 LAB — PTH, INTACT AND CALCIUM
Calcium, Total (PTH): 9 mg/dL (ref 8.7–10.3)
PTH: 344 pg/mL — ABNORMAL HIGH (ref 15–65)

## 2018-02-28 ENCOUNTER — Ambulatory Visit (HOSPITAL_COMMUNITY)
Admission: RE | Admit: 2018-02-28 | Discharge: 2018-02-28 | Disposition: A | Discharge status: Home / Self Care | Payer: Medicare Other | Source: Ambulatory Visit | Attending: Nephrology | Admitting: Nephrology

## 2018-02-28 VITALS — BP 122/97 | HR 67 | Resp 18

## 2018-02-28 DIAGNOSIS — N184 Chronic kidney disease, stage 4 (severe): Secondary | ICD-10-CM | POA: Insufficient documentation

## 2018-02-28 LAB — POCT HEMOGLOBIN-HEMACUE: Hemoglobin: 11.7 g/dL — ABNORMAL LOW (ref 12.0–15.0)

## 2018-02-28 MED ORDER — EPOETIN ALFA 20000 UNIT/ML IJ SOLN
INTRAMUSCULAR | Status: AC
  Administered 2018-02-28: 20000 [IU] via SUBCUTANEOUS
  Filled 2018-02-28: qty 1

## 2018-02-28 MED ORDER — EPOETIN ALFA 20000 UNIT/ML IJ SOLN
20000.0000 [IU] | INTRAMUSCULAR | Status: DC
  Administered 2018-02-28: 20000 [IU] via SUBCUTANEOUS

## 2018-03-14 ENCOUNTER — Encounter (HOSPITAL_COMMUNITY): Payer: Medicare Other

## 2018-03-15 ENCOUNTER — Ambulatory Visit (HOSPITAL_COMMUNITY)
Admission: RE | Admit: 2018-03-15 | Discharge: 2018-03-15 | Disposition: A | Discharge status: Home / Self Care | Payer: Medicare Other | Source: Ambulatory Visit | Attending: Nephrology | Admitting: Nephrology

## 2018-03-15 VITALS — BP 135/43 | HR 46 | Temp 97.7°F | Resp 20

## 2018-03-15 DIAGNOSIS — N184 Chronic kidney disease, stage 4 (severe): Secondary | ICD-10-CM | POA: Diagnosis not present

## 2018-03-15 LAB — POCT HEMOGLOBIN-HEMACUE: Hemoglobin: 12.1 g/dL (ref 12.0–15.0)

## 2018-03-15 MED ORDER — EPOETIN ALFA 20000 UNIT/ML IJ SOLN: 20000.0000 [IU] | INTRAMUSCULAR | Status: DC

## 2018-03-15 MED ORDER — EPOETIN ALFA 20000 UNIT/ML IJ SOLN
INTRAMUSCULAR | Status: AC
  Filled 2018-03-15: qty 1

## 2018-03-29 ENCOUNTER — Ambulatory Visit (HOSPITAL_COMMUNITY)
Admission: RE | Admit: 2018-03-29 | Discharge: 2018-03-29 | Disposition: A | Discharge status: Home / Self Care | Payer: Medicare Other | Source: Ambulatory Visit | Attending: Nephrology | Admitting: Nephrology

## 2018-03-29 VITALS — BP 110/37 | HR 66

## 2018-03-29 DIAGNOSIS — N184 Chronic kidney disease, stage 4 (severe): Secondary | ICD-10-CM | POA: Diagnosis not present

## 2018-03-29 LAB — POCT HEMOGLOBIN-HEMACUE: Hemoglobin: 12 g/dL (ref 12.0–15.0)

## 2018-03-29 MED ORDER — EPOETIN ALFA 20000 UNIT/ML IJ SOLN
INTRAMUSCULAR | Status: AC
  Filled 2018-03-29: qty 1

## 2018-03-29 MED ORDER — EPOETIN ALFA 20000 UNIT/ML IJ SOLN: 20000.0000 [IU] | INTRAMUSCULAR | Status: DC

## 2018-04-12 ENCOUNTER — Encounter (HOSPITAL_COMMUNITY): Payer: Medicare Other

## 2018-04-16 ENCOUNTER — Encounter (HOSPITAL_COMMUNITY)
Admission: RE | Admit: 2018-04-16 | Discharge: 2018-04-16 | Disposition: A | Discharge status: Home / Self Care | Payer: Medicare Other | Source: Ambulatory Visit | Attending: Nephrology | Admitting: Nephrology

## 2018-04-16 VITALS — BP 120/45 | HR 64 | Temp 97.5°F | Resp 20

## 2018-04-16 DIAGNOSIS — N184 Chronic kidney disease, stage 4 (severe): Secondary | ICD-10-CM | POA: Insufficient documentation

## 2018-04-16 DIAGNOSIS — D631 Anemia in chronic kidney disease: Secondary | ICD-10-CM | POA: Insufficient documentation

## 2018-04-16 LAB — RENAL FUNCTION PANEL
Albumin: 3.6 g/dL (ref 3.5–5.0)
Anion gap: 11 (ref 5–15)
BUN: 86 mg/dL — ABNORMAL HIGH (ref 8–23)
CO2: 25 mmol/L (ref 22–32)
Calcium: 9.2 mg/dL (ref 8.9–10.3)
Chloride: 106 mmol/L (ref 98–111)
Creatinine, Ser: 3.73 mg/dL — ABNORMAL HIGH (ref 0.44–1.00)
GFR calc Af Amer: 13 mL/min — ABNORMAL LOW (ref >60)
GFR calc non Af Amer: 11 mL/min — ABNORMAL LOW (ref >60)
Glucose, Bld: 116 mg/dL — ABNORMAL HIGH (ref 70–99)
Phosphorus: 4.7 mg/dL — ABNORMAL HIGH (ref 2.5–4.6)
Potassium: 3.7 mmol/L (ref 3.5–5.1)
Sodium: 142 mmol/L (ref 135–145)

## 2018-04-16 LAB — POCT HEMOGLOBIN-HEMACUE: Hemoglobin: 10.6 g/dL — ABNORMAL LOW (ref 12.0–15.0)

## 2018-04-16 LAB — IRON AND TIBC
Iron: 70 ug/dL (ref 28–170)
Saturation Ratios: 25 % (ref 10.4–31.8)
TIBC: 284 ug/dL (ref 250–450)
UIBC: 214 ug/dL

## 2018-04-16 LAB — FERRITIN: Ferritin: 307 ng/mL (ref 11–307)

## 2018-04-16 MED ORDER — EPOETIN ALFA 20000 UNIT/ML IJ SOLN
20000.0000 [IU] | INTRAMUSCULAR | Status: DC
  Administered 2018-04-16: 20000 [IU] via SUBCUTANEOUS

## 2018-04-16 MED ORDER — EPOETIN ALFA 20000 UNIT/ML IJ SOLN
INTRAMUSCULAR | Status: AC
  Filled 2018-04-16: qty 1

## 2018-04-17 LAB — PTH, INTACT AND CALCIUM
Calcium, Total (PTH): 9 mg/dL (ref 8.7–10.3)
PTH: 239 pg/mL — ABNORMAL HIGH (ref 15–65)

## 2018-04-23 ENCOUNTER — Telehealth: Payer: Self-pay

## 2018-04-23 NOTE — Telephone Encounter (Signed)
Dr. Acie Fredrickson spoke with Dr. [name] and was told the patient complained of CP at her PCP OV today.  Per Dr. Acie Fredrickson, scheduled patient for evaluation 05/10/2018 with B. Bhagat , PA. She was call if her symptoms worsen prior to visit. She was grateful for call and agrees with treatment plan.

## 2018-04-24 ENCOUNTER — Encounter: Payer: Self-pay | Admitting: Cardiovascular Disease

## 2018-04-24 ENCOUNTER — Encounter: Payer: Self-pay | Admitting: Nurse Practitioner

## 2018-04-24 ENCOUNTER — Ambulatory Visit: Payer: Medicare Other | Admitting: Cardiovascular Disease

## 2018-04-24 VITALS — BP 130/56 | HR 71 | Ht 61.0 in | Wt 166.2 lb

## 2018-04-24 DIAGNOSIS — R079 Chest pain, unspecified: Secondary | ICD-10-CM

## 2018-04-24 DIAGNOSIS — R0609 Other forms of dyspnea: Secondary | ICD-10-CM | POA: Diagnosis not present

## 2018-04-24 DIAGNOSIS — R0600 Dyspnea, unspecified: Secondary | ICD-10-CM

## 2018-04-24 DIAGNOSIS — I5042 Chronic combined systolic (congestive) and diastolic (congestive) heart failure: Secondary | ICD-10-CM | POA: Diagnosis not present

## 2018-04-24 MED ORDER — NITROGLYCERIN 0.4 MG SL SUBL: 0.4000 mg | SUBLINGUAL_TABLET | SUBLINGUAL | 6 refills | Status: DC | PRN

## 2018-04-24 NOTE — Progress Notes (Signed)
Cardiology Office Note   Date:  04/24/2018   ID:  Doris Howell, Doris Howell 27, 1937, MRN [id number]  PCP:  Lavone Orn, MD  Cardiologist: Darlin Coco MD -- > now [place]  Chief Complaint  Patient presents with  . Chest Pain   Problem list 1. Moderate to severe pulmonary hypertension - with right heart failure 2. Chronic kidney disease 3. Chronic combined  systolic and diastolic congestive heart failure  Previous Notes from Dr. Mare Ferrari:  Doris Howell is a 83 y.o. female who presents for a scheduled follow-up visit.  Doris Howell is a 83 y.o. female who presents for follow-up after recent hospitalization in April 2016 for exacerbation of chronic diastolic heart failure. During an earlier admission in January 2016 the patient had a right heart catheterization on 04/15/14 and she was found to have a pulmonary artery pressure of 64/25 with a mean of 40. The right ventricular end-diastolic pressure was 15. The impression was biventricular heart failure with preserved left ventricular ejection fraction and moderate pulmonary artery hypertension. The patient responded well to diuresis. Since being discharged her breathing has improved. Her weight has been stable at home. She sleeps on 2 pillows and the head of her bed is elevated on bricks. She is not having any paroxysmal nocturnal dyspnea. The patient has severe kidney disease and left heart catheterization was avoided because of her renal insufficiency. The patient had an echocardiogram on 07/31/14 which showed an ejection fraction of 45-50% with grade 2 diastolic dysfunction and mild mitral regurgitation. The patient has a history of diabetes. She denies any hypoglycemic episodes. Since last visit she has been doing reasonably well. She had a Lexiscan Myoview on 05/05/14 which showed left ventricular systolic dysfunction with ejection fraction 44%. There was no ischemia. The patient has not been having any  chest pain. She has a history of sleep apnea.She now has a CPAP machine which is new since last visit The patient has not been having any symptoms of gout. She is not having any hypoglycemic episodes. She has been experiencing nocturia 2-3 times a night. She has been having some intermittent right upper quadrant discomfort which sounds like a muscular cramp.  It does not have the characteristics of gallbladder colic.   [name] 23, 2018:  Doris Howell is seen for the first time today - transfer from [place] Has had more shortness of breath this past weekend.    Does not eat salt.  Breathing is better.   October 13, 2016:  [name] is seen today for follow up of her biventricular CHF. And a recent visit to the ER. She was in the ER Howell 23, 2018 with atypical CP . Troponin levels were mildly elevated.   0.07, 0.06 No further CP ,  No issues.  Creatinine is now 3.16.   Has had CKD for years.  BP is typically elevated at doctors office  No CP or dyspnea   [name] 14, 2020 Pt is seen today for follow up of an episode of CP Seen with niece, Doris Howell.   The pain was described as a hurting sensation.  It woke her up at 4 AM.  It lasted until 7.  He did out into her left arm.  It resolved spontaneously. She is had shortness of breath for a while.  She did not have any worsening shortness of breath during the episode. Was not pleuretic.  Pain was worse with lying down .   No viral illness,  Does not eat much salt.  Has noticed swelling in her lwegs,   Left > right   Has OSA but has not been wearing her CPAP.    Has progressive renal insufficiency  - creatinine is 3.73.     Past Medical History:  Diagnosis Date  . CAP (community acquired pneumonia) 01/09/2014   "first time I've ever had it"  . Diastolic congestive heart failure ([place])   . GERD (gastroesophageal reflux disease)   . High cholesterol   . History of gout   . Hypertension   . Kidney stones   . Type II diabetes  mellitus with stage 4 chronic kidney disease (HCC)     Past Surgical History:  Procedure Laterality Date  . ABDOMINAL HYSTERECTOMY  ~ 1974  . CATARACT EXTRACTION W/ INTRAOCULAR LENS  IMPLANT, BILATERAL Bilateral 2014  . COLONOSCOPY WITH PROPOFOL N/A 10/23/2012   Procedure: COLONOSCOPY WITH PROPOFOL;  Surgeon: [name], MD;  Location: [facility];  Service: Endoscopy;  Laterality: N/A;  . KIDNEY STONE SURGERY  10-05-12   "cut me on the side"  . LITHOTRIPSY  1980's?  . RIGHT HEART CATHETERIZATION N/A 04/15/2014   Procedure: RIGHT HEART CATH;  Surgeon: Sanda Klein, MD;  Location: [facility] CATH LAB;  Service: Cardiovascular;  Laterality: N/A;     Current Outpatient Medications  Medication Sig Dispense Refill  . albuterol (PROVENTIL HFA;VENTOLIN HFA) 108 (90 BASE) MCG/ACT inhaler Inhale 2 puffs into the lungs every 6 (six) hours as needed for wheezing or shortness of breath. 1 Inhaler 0  . allopurinol (ZYLOPRIM) 100 MG tablet Take 100 mg by mouth 2 (two) times daily.    [name] aspirin EC 81 MG tablet Take 81 mg by mouth daily.    . carvedilol (COREG) 12.5 MG tablet Take 12.5 mg by mouth 2 (two) times daily with a meal.    . hydrALAZINE (APRESOLINE) 25 MG tablet Take 25 mg by mouth 2 (two) times daily.     [name] HYDROcodone-acetaminophen (NORCO/VICODIN) 5-325 MG tablet Take 1 tablet by mouth every 6 (six) hours as needed for severe pain. 10 tablet 0  . latanoprost (XALATAN) 0.005 % ophthalmic solution Place 1 drop into both eyes at bedtime.    . torsemide (DEMADEX) 20 MG tablet Take 1-2 tablets (20-40 mg total) by mouth 2 (two) times daily. Take 2 tabs daily at 8 AM and 1 tab daily after lunch at 1 PM. (Patient taking differently: Take 20-40 mg See admin instructions by mouth. Take 2 tabs daily at 8 AM and 1 tab qhs) 90 tablet 0  . Vitamin D, Cholecalciferol, 400 UNITS CAPS Take 2 capsules by mouth daily.     No current facility-administered medications for this visit.     Allergies:   Simvastatin  and Codeine    Social History:  The patient  reports that she has never smoked. She has never used smokeless tobacco. She reports that she does not drink alcohol or use drugs.   Family History:  The patient's family history includes Diabetes in her brother; Hypertension in her mother.    ROS:  Please see the history of present illness.   Otherwise, review of systems are positive for none.   All other systems are reviewed and negative.    Physical Exam: Blood pressure (!) 130/56, pulse 71, height 5\' 1"  (1.549 m), weight 166 lb 3.2 oz (75.4 kg), SpO2 97 %.  GEN:   Elderly female ,  NAD  HEENT: Normal NECK: No JVD; No carotid  bruits LYMPHATICS: No lymphadenopathy CARDIAC: RRR  With frequent premature beats  RESPIRATORY:   Clear  ABDOMEN: Soft, non-tender, non-distended MUSCULOSKELETAL:  1-2+ pitting edema in legs  SKIN: Warm and dry NEUROLOGIC:  Alert and oriented x 3    EKG:   April 24, 2018: Normal sinus rhythm at 71.  Frequent premature ventricular contractions in a trigeminal pattern.  Left bundle branch block.   Recent Labs: 04/16/2018: BUN 86; Creatinine, Ser 3.73; Hemoglobin 10.6; Potassium 3.7; Sodium 142    Lipid Panel No results found for: CHOL, TRIG, HDL, CHOLHDL, VLDL, LDLCALC, LDLDIRECT    Wt Readings from Last 3 Encounters:  04/24/18 166 lb 3.2 oz (75.4 kg)  11/08/17 161 lb (73 kg)  10/09/17 160 lb (72.6 kg)        ASSESSMENT AND PLAN:  1.  Chest discomfort: Her symptoms are somewhat worrisome for unstable angina. Recently her situation is complicated by stage V chronic kidney disease.  Her creatinine is 3.7. We had a long discussion about the fact that doing a CT scan or a heart catheterization could very likely result in end-stage renal disease.  She is not ready to discuss dialysis.  She is been followed by Dr. Posey Pronto.  At this point I think we will treat her conservatively.  We will try to diurese her by increasing her torsemide to 40 mg 3 times a  day for the next 2 to 3 days..   I given her prescription for sublingual nitroglycerin to take if she has further episodes of chest tightness. Her potassium level is a little on the low side and we will continue to watch that.  I do not want to give her any potassium at this time.   We will get a [place] study for further evaluation.  If the Myoview study is a high risk study-especially in the anterior distribution, I think it would be important for [place] to discuss very quickly whether or not she wants to go on dialysis. We will also get an echocardiogram. Her potassium is borderline low.  We will have her increase her dietary potassium during this time when she is on the extra torsemide.  I will discuss with Dr. Posey Pronto ( nephrology )    1.  Biventricular congestive heart failure, improved. Ejection fraction 50% by echo. Ejection fraction 44% by Lexiscan Myoview on 05/05/14.   2. Renal insufficiency stage 5  3. Anemia of chronic disease 4.  Left bundle branch block, chronic   Current medicines are reviewed at length with the patient today.  The patient does not have concerns regarding medicines.  The following changes have been made:  no change  Labs/ tests ordered today include:   No orders of the defined types were placed in this encounter.

## 2018-04-24 NOTE — Telephone Encounter (Signed)
Called patient and offered her appointment with Dr. Acie Fredrickson for today which was accepted

## 2018-04-24 NOTE — Patient Instructions (Signed)
Medication Instructions:  Your physician has recommended you make the following change in your medication:  INCREASE Torsemide (Demadex) to 40 mg 3 times per day for 3 days (Wed, Thurs, Fri)   If you need a refill on your cardiac medications before your next appointment, please call your pharmacy.   Lab work: Your physician recommends that you return for lab work in: 1-2 weeks (can be the same day as your Echo or Stress test)  If you have labs (blood work) drawn today and your tests are completely normal, you will receive your results only by: [name] MyChart Message (if you have MyChart) OR . A paper copy in the mail If you have any lab test that is abnormal or we need to change your treatment, we will call you to review the results.   Testing/Procedures: Your physician has requested that you have an echocardiogram. Echocardiography is a painless test that uses sound waves to create images of your heart. It provides your doctor with information about the size and shape of your heart and how well your heart's chambers and valves are working. This procedure takes approximately one hour. There are no restrictions for this procedure.  Your physician has requested that you have a lexiscan myoview. For further information please visit [email or web address]. Please follow instruction sheet, as given.    Follow-Up: Eat more foods that contain a lot of potassium, such as: ? Nuts, such as peanuts and pistachios. ? Seeds, such as sunflower seeds and pumpkin seeds. ? Peas, lentils, and lima beans. ? Whole grain and bran cereals and breads. ? Fresh fruits and vegetables, such as apricots, avocado, bananas, cantaloupe, kiwi, oranges, tomatoes, asparagus, and potatoes. ? Orange juice. ? Tomato juice. ? Red meats. ? Yogurt.   Your physician recommends that you schedule a follow-up appointment in: 6 weeks with Dr. Acie Fredrickson

## 2018-04-30 ENCOUNTER — Ambulatory Visit (HOSPITAL_COMMUNITY)
Admission: RE | Admit: 2018-04-30 | Discharge: 2018-04-30 | Disposition: A | Discharge status: Home / Self Care | Payer: Medicare Other | Source: Ambulatory Visit | Attending: Nephrology | Admitting: Nephrology

## 2018-04-30 VITALS — BP 128/89 | HR 56 | Temp 97.8°F | Resp 20

## 2018-04-30 DIAGNOSIS — N184 Chronic kidney disease, stage 4 (severe): Secondary | ICD-10-CM | POA: Insufficient documentation

## 2018-04-30 LAB — POCT HEMOGLOBIN-HEMACUE: Hemoglobin: 10.7 g/dL — ABNORMAL LOW (ref 12.0–15.0)

## 2018-04-30 MED ORDER — EPOETIN ALFA 20000 UNIT/ML IJ SOLN
INTRAMUSCULAR | Status: AC
  Administered 2018-04-30: 20000 [IU] via SUBCUTANEOUS
  Filled 2018-04-30: qty 1

## 2018-04-30 MED ORDER — EPOETIN ALFA 20000 UNIT/ML IJ SOLN
20000.0000 [IU] | INTRAMUSCULAR | Status: DC
  Administered 2018-04-30: 20000 [IU] via SUBCUTANEOUS

## 2018-05-07 ENCOUNTER — Telehealth (HOSPITAL_COMMUNITY): Payer: Self-pay | Admitting: *Deleted

## 2018-05-07 NOTE — Telephone Encounter (Signed)
Patient given detailed instructions per Myocardial Perfusion Study Information Sheet for the test on  05/09/18. Patient notified to arrive 15 minutes early and that it is imperative to arrive on time for appointment to keep from having the test rescheduled.  If you need to cancel or reschedule your appointment, please call the office within 24 hours of your appointment. . Patient verbalized understanding. Doris Howell    

## 2018-05-09 ENCOUNTER — Other Ambulatory Visit: Payer: Medicare Other | Admitting: *Deleted

## 2018-05-09 ENCOUNTER — Other Ambulatory Visit (HOSPITAL_COMMUNITY): Payer: Medicare Other

## 2018-05-09 ENCOUNTER — Ambulatory Visit (HOSPITAL_COMMUNITY): Payer: Medicare Other | Attending: Cardiology

## 2018-05-09 ENCOUNTER — Other Ambulatory Visit: Payer: Self-pay | Admitting: Nurse Practitioner

## 2018-05-09 ENCOUNTER — Ambulatory Visit (HOSPITAL_BASED_OUTPATIENT_CLINIC_OR_DEPARTMENT_OTHER): Payer: Medicare Other

## 2018-05-09 DIAGNOSIS — Z79899 Other long term (current) drug therapy: Secondary | ICD-10-CM

## 2018-05-09 DIAGNOSIS — R079 Chest pain, unspecified: Secondary | ICD-10-CM

## 2018-05-09 DIAGNOSIS — R0600 Dyspnea, unspecified: Secondary | ICD-10-CM

## 2018-05-09 DIAGNOSIS — I5042 Chronic combined systolic (congestive) and diastolic (congestive) heart failure: Secondary | ICD-10-CM

## 2018-05-09 DIAGNOSIS — R0609 Other forms of dyspnea: Secondary | ICD-10-CM | POA: Insufficient documentation

## 2018-05-09 LAB — MYOCARDIAL PERFUSION IMAGING
LV dias vol: 165 mL (ref 46–106)
LV sys vol: 101 mL
Peak HR: 83 {beats}/min
Rest HR: 64 {beats}/min
SDS: 1
SRS: 1
SSS: 2
TID: 1.05

## 2018-05-09 LAB — BASIC METABOLIC PANEL
BUN/Creatinine Ratio: 21 (ref 12–28)
BUN: 74 mg/dL — ABNORMAL HIGH (ref 8–27)
CO2: 22 mmol/L (ref 20–29)
Calcium: 9.3 mg/dL (ref 8.7–10.3)
Chloride: 103 mmol/L (ref 96–106)
Creatinine, Ser: 3.56 mg/dL — ABNORMAL HIGH (ref 0.57–1.00)
GFR calc Af Amer: 13 mL/min/{1.73_m2} — ABNORMAL LOW (ref >59)
GFR calc non Af Amer: 11 mL/min/{1.73_m2} — ABNORMAL LOW (ref >59)
Glucose: 100 mg/dL — ABNORMAL HIGH (ref 65–99)
Potassium: 3.7 mmol/L (ref 3.5–5.2)
Sodium: 145 mmol/L — ABNORMAL HIGH (ref 134–144)

## 2018-05-09 LAB — ECHOCARDIOGRAM COMPLETE
Height: 61 in
Weight: 2656 oz

## 2018-05-09 MED ORDER — TECHNETIUM TC 99M TETROFOSMIN IV KIT
10.1000 | PACK | Freq: Once | INTRAVENOUS | Status: AC | PRN
  Administered 2018-05-09: 10.1 via INTRAVENOUS
  Filled 2018-05-09: qty 11

## 2018-05-09 MED ORDER — TECHNETIUM TC 99M TETROFOSMIN IV KIT
31.9000 | PACK | Freq: Once | INTRAVENOUS | Status: AC | PRN
  Administered 2018-05-09: 31.9 via INTRAVENOUS
  Filled 2018-05-09: qty 32

## 2018-05-09 MED ORDER — REGADENOSON 0.4 MG/5ML IV SOLN
0.4000 mg | Freq: Once | INTRAVENOUS | Status: AC
  Administered 2018-05-09: 0.4 mg via INTRAVENOUS

## 2018-05-09 NOTE — Progress Notes (Signed)
bmet  

## 2018-05-10 ENCOUNTER — Ambulatory Visit: Payer: Medicare Other | Admitting: Physician Assistant

## 2018-05-14 ENCOUNTER — Encounter (HOSPITAL_COMMUNITY): Payer: Medicare Other

## 2018-05-15 ENCOUNTER — Ambulatory Visit (HOSPITAL_COMMUNITY)
Admission: RE | Admit: 2018-05-15 | Discharge: 2018-05-15 | Disposition: A | Discharge status: Home / Self Care | Payer: Medicare Other | Source: Ambulatory Visit | Attending: Nephrology | Admitting: Nephrology

## 2018-05-15 VITALS — BP 124/61 | HR 62 | Temp 98.1°F | Resp 20

## 2018-05-15 DIAGNOSIS — N184 Chronic kidney disease, stage 4 (severe): Secondary | ICD-10-CM | POA: Insufficient documentation

## 2018-05-15 LAB — RENAL FUNCTION PANEL
Albumin: 3.5 g/dL (ref 3.5–5.0)
Anion gap: 14 (ref 5–15)
BUN: 79 mg/dL — ABNORMAL HIGH (ref 8–23)
CO2: 26 mmol/L (ref 22–32)
Calcium: 9.4 mg/dL (ref 8.9–10.3)
Chloride: 104 mmol/L (ref 98–111)
Creatinine, Ser: 3.75 mg/dL — ABNORMAL HIGH (ref 0.44–1.00)
GFR calc Af Amer: 12 mL/min — ABNORMAL LOW (ref >60)
GFR calc non Af Amer: 11 mL/min — ABNORMAL LOW (ref >60)
Glucose, Bld: 140 mg/dL — ABNORMAL HIGH (ref 70–99)
Phosphorus: 4.7 mg/dL — ABNORMAL HIGH (ref 2.5–4.6)
Potassium: 3.5 mmol/L (ref 3.5–5.1)
Sodium: 144 mmol/L (ref 135–145)

## 2018-05-15 LAB — IRON AND TIBC
Iron: 59 ug/dL (ref 28–170)
Saturation Ratios: 20 % (ref 10.4–31.8)
TIBC: 291 ug/dL (ref 250–450)
UIBC: 232 ug/dL

## 2018-05-15 LAB — FERRITIN: Ferritin: 205 ng/mL (ref 11–307)

## 2018-05-15 LAB — POCT HEMOGLOBIN-HEMACUE: Hemoglobin: 10.9 g/dL — ABNORMAL LOW (ref 12.0–15.0)

## 2018-05-15 MED ORDER — EPOETIN ALFA 20000 UNIT/ML IJ SOLN
20000.0000 [IU] | INTRAMUSCULAR | Status: DC
  Administered 2018-05-15: 20000 [IU] via SUBCUTANEOUS

## 2018-05-15 MED ORDER — EPOETIN ALFA 20000 UNIT/ML IJ SOLN
INTRAMUSCULAR | Status: AC
  Filled 2018-05-15: qty 1

## 2018-05-16 LAB — PTH, INTACT AND CALCIUM
Calcium, Total (PTH): 9.3 mg/dL (ref 8.7–10.3)
PTH: 204 pg/mL — ABNORMAL HIGH (ref 15–65)

## 2018-05-28 ENCOUNTER — Encounter: Payer: Self-pay | Admitting: Cardiovascular Disease

## 2018-05-29 ENCOUNTER — Ambulatory Visit (HOSPITAL_COMMUNITY)
Admission: RE | Admit: 2018-05-29 | Discharge: 2018-05-29 | Disposition: A | Discharge status: Home / Self Care | Payer: Medicare Other | Source: Ambulatory Visit | Attending: Nephrology | Admitting: Nephrology

## 2018-05-29 VITALS — BP 130/58 | HR 55 | Temp 98.0°F | Resp 20

## 2018-05-29 DIAGNOSIS — N184 Chronic kidney disease, stage 4 (severe): Secondary | ICD-10-CM | POA: Diagnosis present

## 2018-05-29 MED ORDER — EPOETIN ALFA 20000 UNIT/ML IJ SOLN
INTRAMUSCULAR | Status: AC
  Administered 2018-05-29: 20000 [IU] via SUBCUTANEOUS
  Filled 2018-05-29: qty 1

## 2018-05-29 MED ORDER — EPOETIN ALFA 20000 UNIT/ML IJ SOLN
20000.0000 [IU] | INTRAMUSCULAR | Status: DC
  Administered 2018-05-29: 20000 [IU] via SUBCUTANEOUS

## 2018-05-30 LAB — POCT HEMOGLOBIN-HEMACUE: Hemoglobin: 11.2 g/dL — ABNORMAL LOW (ref 12.0–15.0)

## 2018-06-12 ENCOUNTER — Encounter (HOSPITAL_COMMUNITY): Payer: Medicare Other

## 2018-06-13 ENCOUNTER — Encounter (HOSPITAL_COMMUNITY): Payer: Medicare Other

## 2018-06-18 ENCOUNTER — Emergency Department (HOSPITAL_COMMUNITY): Payer: Medicare Other

## 2018-06-18 ENCOUNTER — Inpatient Hospital Stay (HOSPITAL_COMMUNITY)
Admission: EM | Admit: 2018-06-18 | Discharge: 2018-06-25 | DRG: 291 | Disposition: A | Discharge status: Skilled Nursing Facility | Payer: Medicare Other | Attending: Internal Medicine | Admitting: Internal Medicine

## 2018-06-18 ENCOUNTER — Ambulatory Visit (HOSPITAL_COMMUNITY)
Admission: RE | Admit: 2018-06-18 | Discharge: 2018-06-18 | Disposition: A | Discharge status: Home / Self Care | Payer: Medicare Other | Source: Ambulatory Visit | Attending: Nephrology | Admitting: Nephrology

## 2018-06-18 ENCOUNTER — Other Ambulatory Visit: Payer: Self-pay

## 2018-06-18 ENCOUNTER — Encounter (HOSPITAL_COMMUNITY): Payer: Self-pay

## 2018-06-18 VITALS — BP 129/51 | HR 41 | Temp 98.7°F | Resp 20

## 2018-06-18 DIAGNOSIS — N184 Chronic kidney disease, stage 4 (severe): Secondary | ICD-10-CM | POA: Diagnosis present

## 2018-06-18 DIAGNOSIS — D696 Thrombocytopenia, unspecified: Secondary | ICD-10-CM | POA: Diagnosis present

## 2018-06-18 DIAGNOSIS — M1040 Other secondary gout, unspecified site: Secondary | ICD-10-CM | POA: Diagnosis not present

## 2018-06-18 DIAGNOSIS — I5033 Acute on chronic diastolic (congestive) heart failure: Secondary | ICD-10-CM | POA: Diagnosis present

## 2018-06-18 DIAGNOSIS — I447 Left bundle-branch block, unspecified: Secondary | ICD-10-CM | POA: Diagnosis present

## 2018-06-18 DIAGNOSIS — K219 Gastro-esophageal reflux disease without esophagitis: Secondary | ICD-10-CM | POA: Diagnosis present

## 2018-06-18 DIAGNOSIS — I10 Essential (primary) hypertension: Secondary | ICD-10-CM | POA: Diagnosis not present

## 2018-06-18 DIAGNOSIS — R627 Adult failure to thrive: Secondary | ICD-10-CM | POA: Diagnosis present

## 2018-06-18 DIAGNOSIS — J101 Influenza due to other identified influenza virus with other respiratory manifestations: Secondary | ICD-10-CM | POA: Diagnosis present

## 2018-06-18 DIAGNOSIS — G4730 Sleep apnea, unspecified: Secondary | ICD-10-CM | POA: Diagnosis present

## 2018-06-18 DIAGNOSIS — E1122 Type 2 diabetes mellitus with diabetic chronic kidney disease: Secondary | ICD-10-CM | POA: Diagnosis not present

## 2018-06-18 DIAGNOSIS — D631 Anemia in chronic kidney disease: Secondary | ICD-10-CM | POA: Diagnosis present

## 2018-06-18 DIAGNOSIS — I130 Hypertensive heart and chronic kidney disease with heart failure and stage 1 through stage 4 chronic kidney disease, or unspecified chronic kidney disease: Principal | ICD-10-CM | POA: Diagnosis present

## 2018-06-18 DIAGNOSIS — Z7982 Long term (current) use of aspirin: Secondary | ICD-10-CM | POA: Diagnosis not present

## 2018-06-18 DIAGNOSIS — J9601 Acute respiratory failure with hypoxia: Secondary | ICD-10-CM | POA: Diagnosis present

## 2018-06-18 DIAGNOSIS — I272 Pulmonary hypertension, unspecified: Secondary | ICD-10-CM | POA: Diagnosis present

## 2018-06-18 DIAGNOSIS — Z90710 Acquired absence of both cervix and uterus: Secondary | ICD-10-CM | POA: Diagnosis not present

## 2018-06-18 DIAGNOSIS — Z833 Family history of diabetes mellitus: Secondary | ICD-10-CM | POA: Diagnosis not present

## 2018-06-18 DIAGNOSIS — Z8249 Family history of ischemic heart disease and other diseases of the circulatory system: Secondary | ICD-10-CM | POA: Diagnosis not present

## 2018-06-18 DIAGNOSIS — Z79899 Other long term (current) drug therapy: Secondary | ICD-10-CM | POA: Diagnosis not present

## 2018-06-18 DIAGNOSIS — M109 Gout, unspecified: Secondary | ICD-10-CM | POA: Diagnosis present

## 2018-06-18 DIAGNOSIS — J810 Acute pulmonary edema: Secondary | ICD-10-CM

## 2018-06-18 DIAGNOSIS — R001 Bradycardia, unspecified: Secondary | ICD-10-CM | POA: Diagnosis present

## 2018-06-18 LAB — URINALYSIS, ROUTINE W REFLEX MICROSCOPIC
Bilirubin Urine: NEGATIVE
Glucose, UA: NEGATIVE mg/dL
Hgb urine dipstick: NEGATIVE
Ketones, ur: NEGATIVE mg/dL
Nitrite: NEGATIVE
Protein, ur: 30 mg/dL — AB
Specific Gravity, Urine: 1.011 (ref 1.005–1.030)
pH: 5 (ref 5.0–8.0)

## 2018-06-18 LAB — BASIC METABOLIC PANEL
Anion gap: 11 (ref 5–15)
BUN: 64 mg/dL — ABNORMAL HIGH (ref 8–23)
CO2: 24 mmol/L (ref 22–32)
Calcium: 8.6 mg/dL — ABNORMAL LOW (ref 8.9–10.3)
Chloride: 109 mmol/L (ref 98–111)
Creatinine, Ser: 3.3 mg/dL — ABNORMAL HIGH (ref 0.44–1.00)
GFR calc Af Amer: 15 mL/min — ABNORMAL LOW (ref >60)
GFR calc non Af Amer: 13 mL/min — ABNORMAL LOW (ref >60)
Glucose, Bld: 104 mg/dL — ABNORMAL HIGH (ref 70–99)
Potassium: 4.3 mmol/L (ref 3.5–5.1)
Sodium: 144 mmol/L (ref 135–145)

## 2018-06-18 LAB — CBC WITH DIFFERENTIAL/PLATELET
Abs Immature Granulocytes: 0.01 10*3/uL (ref 0.00–0.07)
Basophils Absolute: 0 10*3/uL (ref 0.0–0.1)
Basophils Relative: 0 %
Eosinophils Absolute: 0.1 10*3/uL (ref 0.0–0.5)
Eosinophils Relative: 2 %
HCT: 37.2 % (ref 36.0–46.0)
Hemoglobin: 11.1 g/dL — ABNORMAL LOW (ref 12.0–15.0)
Immature Granulocytes: 0 %
Lymphocytes Relative: 20 %
Lymphs Abs: 0.7 10*3/uL (ref 0.7–4.0)
MCH: 30 pg (ref 26.0–34.0)
MCHC: 29.8 g/dL — ABNORMAL LOW (ref 30.0–36.0)
MCV: 100.5 fL — ABNORMAL HIGH (ref 80.0–100.0)
Monocytes Absolute: 0.5 10*3/uL (ref 0.1–1.0)
Monocytes Relative: 14 %
Neutro Abs: 2.3 10*3/uL (ref 1.7–7.7)
Neutrophils Relative %: 64 %
Platelets: 123 10*3/uL — ABNORMAL LOW (ref 150–400)
RBC: 3.7 MIL/uL — ABNORMAL LOW (ref 3.87–5.11)
RDW: 14.6 % (ref 11.5–15.5)
WBC: 3.5 10*3/uL — ABNORMAL LOW (ref 4.0–10.5)
nRBC: 0 % (ref 0.0–0.2)

## 2018-06-18 LAB — RENAL FUNCTION PANEL
Albumin: 3.3 g/dL — ABNORMAL LOW (ref 3.5–5.0)
Anion gap: 9 (ref 5–15)
BUN: 64 mg/dL — ABNORMAL HIGH (ref 8–23)
CO2: 26 mmol/L (ref 22–32)
Calcium: 8.5 mg/dL — ABNORMAL LOW (ref 8.9–10.3)
Chloride: 108 mmol/L (ref 98–111)
Creatinine, Ser: 3.25 mg/dL — ABNORMAL HIGH (ref 0.44–1.00)
GFR calc Af Amer: 15 mL/min — ABNORMAL LOW (ref >60)
GFR calc non Af Amer: 13 mL/min — ABNORMAL LOW (ref >60)
Glucose, Bld: 145 mg/dL — ABNORMAL HIGH (ref 70–99)
Phosphorus: 3.2 mg/dL (ref 2.5–4.6)
Potassium: 3.2 mmol/L — ABNORMAL LOW (ref 3.5–5.1)
Sodium: 143 mmol/L (ref 135–145)

## 2018-06-18 LAB — I-STAT TROPONIN, ED: Troponin i, poc: 0.09 ng/mL (ref 0.00–0.08)

## 2018-06-18 LAB — IRON AND TIBC
Iron: 51 ug/dL (ref 28–170)
Saturation Ratios: 19 % (ref 10.4–31.8)
TIBC: 267 ug/dL (ref 250–450)
UIBC: 216 ug/dL

## 2018-06-18 LAB — FERRITIN: Ferritin: 323 ng/mL — ABNORMAL HIGH (ref 11–307)

## 2018-06-18 LAB — MAGNESIUM: Magnesium: 2.2 mg/dL (ref 1.7–2.4)

## 2018-06-18 LAB — BRAIN NATRIURETIC PEPTIDE: B Natriuretic Peptide: 3030.8 pg/mL — ABNORMAL HIGH (ref 0.0–100.0)

## 2018-06-18 LAB — POCT HEMOGLOBIN-HEMACUE: Hemoglobin: 11.5 g/dL — ABNORMAL LOW (ref 12.0–15.0)

## 2018-06-18 MED ORDER — SODIUM CHLORIDE 0.9 % IV SOLN
1.0000 g | Freq: Once | INTRAVENOUS | Status: AC
  Administered 2018-06-18: 1 g via INTRAVENOUS
  Filled 2018-06-18: qty 10

## 2018-06-18 MED ORDER — SODIUM CHLORIDE 0.9 % IV SOLN
500.0000 mg | Freq: Once | INTRAVENOUS | Status: AC
  Administered 2018-06-18: 500 mg via INTRAVENOUS
  Filled 2018-06-18: qty 500

## 2018-06-18 MED ORDER — EPOETIN ALFA 20000 UNIT/ML IJ SOLN
INTRAMUSCULAR | Status: AC
  Filled 2018-06-18: qty 1

## 2018-06-18 MED ORDER — EPOETIN ALFA 20000 UNIT/ML IJ SOLN
20000.0000 [IU] | INTRAMUSCULAR | Status: DC
  Administered 2018-06-18: 20000 [IU] via SUBCUTANEOUS

## 2018-06-18 MED ORDER — FUROSEMIDE 10 MG/ML IJ SOLN
40.0000 mg | Freq: Once | INTRAMUSCULAR | Status: DC
  Administered 2018-06-19: 40 mg via INTRAVENOUS
  Filled 2018-06-18: qty 4

## 2018-06-18 NOTE — ED Triage Notes (Signed)
Pt arrives EMS from PMD office where she was noted as having bigemany and trigmaney in office. Also noted as having congested cough since Tuesday.

## 2018-06-18 NOTE — ED Notes (Signed)
ED TO INPATIENT HANDOFF REPORT  ED Nurse Name and Phone #: Suezanne Jacquet [phone]  S Name/Age/Gender Doris Howell 83 y.o. female Room/Bed: H019C/H019C  Code Status   Code Status: Prior  Home/SNF/Other Home Patient oriented to: self, place, time and situation Is this baseline? Yes   Triage Complete: Triage complete  Chief Complaint Irregular Heatbeats  Triage Note Pt arrives EMS from PMD office where she was noted as having bigemany and trigmaney in office. Also noted as having congested cough since Tuesday.   Allergies Allergies  Allergen Reactions  . Other Nausea Only    pholcodeine  . Simvastatin Other (See Comments)    Myalgias- MD took patient off med Myalgias- MD took patient off med  . Codeine Other (See Comments)    Unknown; pt can't remember. It was in the '70s Unknown; pt can't remember. It was in the '70s    Level of Care/Admitting Diagnosis ED Disposition    ED Disposition Condition Alpha: [place] [100100]  Level of Care: Medical Telemetry [104]  Diagnosis: Acute pulmonary edema [facility]) [[id number]]  Admitting Physician: Rise Patience [phone]  Attending Physician: Rise Patience [phone]  Estimated length of stay: past midnight tomorrow  Certification:: I certify this patient will need inpatient services for at least 2 midnights  PT Class (Do Not Modify): Inpatient [101]  PT Acc Code (Do Not Modify): Private [1]       B Medical/Surgery History Past Medical History:  Diagnosis Date  . CAP (community acquired pneumonia) 01/09/2014   "first time I've ever had it"  . Diastolic congestive heart failure ([place])   . GERD (gastroesophageal reflux disease)   . High cholesterol   . History of gout   . Hypertension   . Kidney stones   . Type II diabetes mellitus with stage 4 chronic kidney disease (HCC)    Past Surgical History:  Procedure Laterality Date  . ABDOMINAL HYSTERECTOMY  ~ 1974  . CATARACT  EXTRACTION W/ INTRAOCULAR LENS  IMPLANT, BILATERAL Bilateral 2014  . COLONOSCOPY WITH PROPOFOL N/A 10/23/2012   Procedure: COLONOSCOPY WITH PROPOFOL;  Surgeon: [name], MD;  [facility];  Service: Endoscopy;  Laterality: N/A;  . KIDNEY STONE SURGERY  10-05-12   "cut me on the side"  . LITHOTRIPSY  1980's?  . RIGHT HEART CATHETERIZATION N/A 04/15/2014   Procedure: RIGHT HEART CATH;  Surgeon: Sanda Klein, MD;  Location: [facility] CATH LAB;  Service: Cardiovascular;  Laterality: N/A;     A IV Location/Drains/Wounds Patient Lines/Drains/Airways Status   Active Line/Drains/Airways    Name:   Placement date:   Placement time:   Site:   Days:   Peripheral IV   -    -    -      Peripheral IV 06/18/18 Right Forearm   06/18/18    2144    Forearm   less than 1          Intake/Output Last 24 hours No intake or output data in the 24 hours ending 06/18/18 2253  Labs/Imaging Results for orders placed or performed during the hospital encounter of 06/18/18 (from the past 48 hour(s))  Basic metabolic panel     Status: Abnormal   Collection Time: 06/18/18  6:06 PM  Result Value Ref Range   Sodium 144 135 - 145 mmol/L   Potassium 4.3 3.5 - 5.1 mmol/L   Chloride 109 98 - 111 mmol/L   CO2 24 22 -  32 mmol/L   Glucose, Bld 104 (H) 70 - 99 mg/dL   BUN 64 (H) 8 - 23 mg/dL   Creatinine, Ser 3.30 (H) 0.44 - 1.00 mg/dL   Calcium 8.6 (L) 8.9 - 10.3 mg/dL   GFR calc non Af Amer 13 (L) >60 mL/min   GFR calc Af Amer 15 (L) >60 mL/min   Anion gap 11 5 - 15    Comment: Performed at [facility] [address] [place], [place] [address]  CBC with Differential     Status: Abnormal   Collection Time: 06/18/18  6:06 PM  Result Value Ref Range   WBC 3.5 (L) 4.0 - 10.5 K/uL   RBC 3.70 (L) 3.87 - 5.11 MIL/uL   Hemoglobin 11.1 (L) 12.0 - 15.0 g/dL   HCT 37.2 36.0 - 46.0 %   MCV 100.5 (H) 80.0 - 100.0 fL   MCH 30.0 26.0 - 34.0 pg   MCHC 29.8 (L) 30.0 - 36.0 g/dL   RDW 14.6 11.5 - 15.5 %    Platelets 123 (L) 150 - 400 K/uL    Comment: REPEATED TO VERIFY PLATELET COUNT CONFIRMED BY SMEAR SPECIMEN CHECKED FOR CLOTS    nRBC 0.0 0.0 - 0.2 %   Neutrophils Relative % 64 %   Neutro Abs 2.3 1.7 - 7.7 K/uL   Lymphocytes Relative 20 %   Lymphs Abs 0.7 0.7 - 4.0 K/uL   Monocytes Relative 14 %   Monocytes Absolute 0.5 0.1 - 1.0 K/uL   Eosinophils Relative 2 %   Eosinophils Absolute 0.1 0.0 - 0.5 K/uL   Basophils Relative 0 %   Basophils Absolute 0.0 0.0 - 0.1 K/uL   Immature Granulocytes 0 %   Abs Immature Granulocytes 0.01 0.00 - 0.07 K/uL    Comment: Performed at [facility], [address], [place], [place] [address]  Magnesium     Status: None   Collection Time: 06/18/18  6:20 PM  Result Value Ref Range   Magnesium 2.2 1.7 - 2.4 mg/dL    Comment: Performed at [facility] [address] [place], [place] [address]  I-Stat Troponin, ED (not at [facility])     Status: Abnormal   Collection Time: 06/18/18  6:49 PM  Result Value Ref Range   Troponin i, poc 0.09 (HH) 0.00 - 0.08 ng/mL   Comment NOTIFIED PHYSICIAN    Comment 3            Comment: Due to the release kinetics of cTnI, a negative result within the first hours of the onset of symptoms does not rule out myocardial infarction with certainty. If myocardial infarction is still suspected, repeat the test at appropriate intervals.   Brain natriuretic peptide     Status: Abnormal   Collection Time: 06/18/18  8:36 PM  Result Value Ref Range   B Natriuretic Peptide 3,030.8 (H) 0.0 - 100.0 pg/mL    Comment: Performed at [facility] [address], [place],  [address]  Urinalysis, Routine w reflex microscopic     Status: Abnormal   Collection Time: 06/18/18  9:24 PM  Result Value Ref Range   Color, Urine YELLOW YELLOW   APPearance CLEAR CLEAR   Specific Gravity, Urine 1.011 1.005 - 1.030   pH 5.0 5.0 - 8.0   Glucose, UA NEGATIVE NEGATIVE mg/dL   Hgb urine dipstick NEGATIVE NEGATIVE    Bilirubin Urine NEGATIVE NEGATIVE   Ketones, ur NEGATIVE NEGATIVE mg/dL  Protein, ur 30 (A) NEGATIVE mg/dL   Nitrite NEGATIVE NEGATIVE   Leukocytes,Ua TRACE (A) NEGATIVE   RBC / HPF 0-5 0 - 5 RBC/hpf   WBC, UA 6-10 0 - 5 WBC/hpf   Bacteria, UA MANY (A) NONE SEEN   Squamous Epithelial / LPF 0-5 0 - 5   Hyaline Casts, UA PRESENT     Comment: Performed at [facility], [place] [address] [place], [place] [address]   Dg Chest 2 View  Result Date: 06/18/2018 CLINICAL DATA:  83 y/o  F; cough, shortness of breath, and fatigue. EXAM: CHEST - 2 VIEW COMPARISON:  08/30/2016 chest radiograph. FINDINGS: Stable cardiomegaly given projection and technique. Aortic calcific atherosclerosis. Interstitial pulmonary edema and patchy basilar opacities. No pleural effusion or pneumothorax. Flowing anterior ossification of thoracic spine compatible with DISH. No acute osseous abnormality is evident. IMPRESSION: Cardiomegaly and interstitial pulmonary edema. Patchy basilar opacities may represent developing alveolar edema or pneumonia. Electronically Signed   By: Kristine Garbe M.D.   On: 06/18/2018 19:19    Pending Labs Unresulted Labs (From admission, onward)    Start     Ordered   06/18/18 2232  Respiratory Panel by PCR  (Respiratory virus panel with precautions)  Once,   R     06/18/18 2231   06/18/18 2225  Urine culture  ONCE - STAT,   STAT     06/18/18 2224   06/18/18 2123  Influenza panel by PCR (type A & B)  (Influenza PCR Panel)  Once,   R     06/18/18 2122   06/18/18 2022  Culture, blood (routine x 2)  BLOOD CULTURE X 2,   STAT     06/18/18 2021          Vitals/Pain Today's Vitals   06/18/18 2030 06/18/18 2144 06/18/18 2146 06/18/18 2230  BP: (!) 148/72  (!) 142/71 139/69  Pulse: 63  (!) 50 63  Resp: 19  (!) 23 (!) 23  Temp:  98.5 F (36.9 C)    TempSrc:  Oral    SpO2: 97%  95% 96%  Weight:      Height:      PainSc:        Isolation Precautions Droplet  precaution  Medications Medications  azithromycin (ZITHROMAX) 500 mg in sodium chloride 0.9 % 250 mL IVPB (500 mg Intravenous New Bag/Given 06/18/18 2154)  furosemide (LASIX) injection 40 mg (has no administration in time range)  cefTRIAXone (ROCEPHIN) 1 g in sodium chloride 0.9 % 100 mL IVPB (1 g Intravenous New Bag/Given 06/18/18 2152)    Mobility walks with person assist High fall risk   Focused Assessments Pulmonary Assessment Handoff:  Lung sounds: Bilateral Breath Sounds: Diminished, Expiratory wheezes O2 Device: Room Air        R Recommendations: See Admitting Provider Note  Report given to:   Additional Notes: Positive for pneumonia.

## 2018-06-18 NOTE — ED Provider Notes (Signed)
Medical screening examination/treatment/procedure(s) were conducted as a shared visit with non-physician practitioner(s) and myself.  I personally evaluated the patient during the encounter. Briefly, the patient is a 83 y.o. female with history of hypertension, high cholesterol, diabetes, heart failure who presents the ED with palpitations, shortness of breath.  Patient EKG that shows sinus rhythm with PVCs.  Patient denies any chest pain.  Has had orthopnea, shortness of breath with exertion, cough, sputum production.  Patient has signs of volume overload on exam, rales throughout on exam, pitting edema in her legs.  Vitals are overall unremarkable.  No fever.  Patient with infectious work-up initiated as well as cardiac work-up.  Troponin mildly elevated as well as BNP.  Likely component of heart failure.  Patient also had urinalysis that showed possible infection.  Chest x-ray shows volume overload versus pneumonia.  Otherwise no significant electrolyte abnormality, kidney injury.  Patient given IV antibiotics and will also give IV Lasix.  Suspect likely heart failure exacerbation.  Possible UTI.  Little less likely that patient has pneumonia.  No fever.  Overall patient hemodynamically stable.  Admitted to hospitalist service for further care.  This chart was dictated using voice recognition software.  Despite best efforts to proofread,  errors can occur which can change the documentation meaning.     EKG Interpretation  Date/Time:  Monday June 18 2018 17:44:52 EDT Ventricular Rate:  69 PR Interval:    QRS Duration: 152 QT Interval:  455 QTC Calculation: 488 R Axis:   111 Text Interpretation:  Sinus rhythm Ventricular premature complex Nonspecific intraventricular conduction delay Confirmed by Lennice Sites [id number]) on 06/18/2018 5:48:11 PM           Lennice Sites, DO 06/18/18 2214

## 2018-06-18 NOTE — ED Notes (Signed)
Iv attemp[ted withput success.

## 2018-06-18 NOTE — ED Notes (Signed)
I Stat Trop I result of 0.09 reported to Armstead Peaks PA.

## 2018-06-18 NOTE — ED Provider Notes (Signed)
[facility] EMERGENCY DEPARTMENT Provider Note   CSN: [id number] Arrival date & time: 06/18/18  1736    History   Chief Complaint No chief complaint on file.   HPI Doris Howell is a 83 y.o. female with history of type 2 diabetes, hypertension, CHF who presents with a one-week history of cough, congestion, shortness of breath, and generalized weakness.  Patient went to her anemia infusion today and had an EKG and was found to be in bigeminy and trigeminy and having episodes of bradycardia in the 30s.  Patient denies any chest pain.  She denies any lightheadedness or dizziness, fevers, abdominal pain, nausea, vomiting, urinary symptoms.  She is not currently taking any medications for her symptoms.  Her husband has had similar symptoms of cough.     HPI  Past Medical History:  Diagnosis Date  . CAP (community acquired pneumonia) 01/09/2014   "first time I've ever had it"  . Diastolic congestive heart failure ([place])   . GERD (gastroesophageal reflux disease)   . High cholesterol   . History of gout   . Hypertension   . Kidney stones   . Type II diabetes mellitus with stage 4 chronic kidney disease [facility])     Patient Active Problem List   Diagnosis Date Noted  . Influenza A 06/19/2018  . Acute pulmonary edema ([place]) 06/18/2018  . Pulmonary hypertension ([place]) 10/13/2016  . Left bundle branch block 12/22/2014  . Renal failure, acute on chronic (HCC)   . Pleural effusion on right   . Lung infiltrate   . Acute respiratory failure, unspecified whether with hypoxia or hypercapnia ([place])   . CHF exacerbation ([place])   . Acute on chronic renal failure ([place])   . Acute exacerbation of CHF (congestive heart failure) ([place]) 08/28/2014  . Acute diastolic CHF (congestive heart failure) ([place]) 08/28/2014  . Chronic kidney disease (CKD), stage IV (severe) ([place])   . Acute on chronic combined systolic and diastolic CHF (congestive heart failure) ([place])   . Sleep apnea  07/29/2014  . Obesity (BMI 37) 04/14/2014  . Bradycardia- beta blocker decreased 04/14/2014  . Pulmonary infiltrates   . Diastolic CHF, acute on chronic (HCC) 04/11/2014  . Type 2 diabetes mellitus with stage 4 chronic kidney disease ([place]) 04/11/2014  . Essential hypertension 04/11/2014  . Gout 04/11/2014  . HCAP (healthcare-associated pneumonia)   . Hyperkalemia 02/08/2014  . Chronic diastolic heart failure ([place]) 02/05/2014  . CKD (chronic kidney disease), stage IV ([place]) 02/05/2014  . CAP (community acquired pneumonia) 01/09/2014  . Acute respiratory failure with hypoxia ([place]) 01/09/2014  . Anemia 01/09/2014  . AKI (acute kidney injury) ([place]) 01/09/2014    Past Surgical History:  Procedure Laterality Date  . ABDOMINAL HYSTERECTOMY  ~ 1974  . CATARACT EXTRACTION W/ INTRAOCULAR LENS  IMPLANT, BILATERAL Bilateral 2014  . COLONOSCOPY WITH PROPOFOL N/A 10/23/2012   Procedure: COLONOSCOPY WITH PROPOFOL;  Surgeon: [name], MD;  Location: [facility] ENDOSCOPY;  Service: Endoscopy;  Laterality: N/A;  . KIDNEY STONE SURGERY  10-05-12   "cut me on the side"  . LITHOTRIPSY  1980's?  . RIGHT HEART CATHETERIZATION N/A 04/15/2014   Procedure: RIGHT HEART CATH;  Surgeon: Sanda Klein, MD;  Location: [facility] CATH LAB;  Service: Cardiovascular;  Laterality: N/A;     OB History   No obstetric history on file.      Home Medications    Prior to Admission medications   Medication Sig Start Date End Date Taking? Authorizing Provider  albuterol (PROVENTIL HFA;VENTOLIN HFA) 108 (90 BASE) MCG/ACT inhaler Inhale 2 puffs into the lungs every 6 (six) hours as needed for wheezing or shortness of breath. 01/13/14  Yes [name], MD  allopurinol (ZYLOPRIM) 100 MG tablet Take 100 mg by mouth 2 (two) times daily.   Yes [provider]  aspirin EC 81 MG tablet Take 81 mg by mouth daily.   Yes [provider]  carvedilol (COREG) 12.5 MG tablet Take 12.5 mg by mouth 2 (two) times daily with a  meal.   Yes [provider]  hydrALAZINE (APRESOLINE) 25 MG tablet Take 25 mg by mouth 2 (two) times daily.    Yes [provider]  latanoprost (XALATAN) 0.005 % ophthalmic solution Place 1 drop into both eyes at bedtime.   Yes [provider]  nitroGLYCERIN (NITROSTAT) 0.4 MG SL tablet Place 1 tablet (0.4 mg total) under the tongue every 5 (five) minutes as needed for chest pain. 04/24/18  Yes Nahser, Wonda Cheng, MD  torsemide (DEMADEX) 20 MG tablet Take 1-2 tablets (20-40 mg total) by mouth 2 (two) times daily. Take 2 tabs daily at 8 AM and 1 tab daily after lunch at 1 PM. Patient taking differently: Take 20-40 mg by mouth See admin instructions. Take 40mg  daily at 8 AM and 20mg  in the evening 09/11/14  Yes Hongalgi, Lenis Dickinson, MD  Vitamin D, Cholecalciferol, 400 UNITS CAPS Take 2 capsules by mouth daily.   Yes [provider]    Family History Family History  Problem Relation Age of Onset  . Hypertension Mother   . Diabetes Brother   . CAD Neg Hx     Social History Social History   Tobacco Use  . Smoking status: Never Smoker  . Smokeless tobacco: Never Used  Substance Use Topics  . Alcohol use: No  . Drug use: No     Allergies   Other; Simvastatin; and Codeine   Review of Systems Review of Systems  Constitutional: Positive for fatigue. Negative for chills and fever.  HENT: Positive for congestion. Negative for ear pain, facial swelling and sore throat.   Respiratory: Positive for cough and shortness of breath.   Cardiovascular: Negative for chest pain.  Gastrointestinal: Negative for abdominal pain, nausea and vomiting.  Genitourinary: Negative for dysuria.  Musculoskeletal: Negative for back pain.  Skin: Negative for rash and wound.  Neurological: Positive for weakness. Negative for dizziness, light-headedness and headaches.  Psychiatric/Behavioral: The patient is not nervous/anxious.      Physical Exam Updated Vital Signs BP (!)  148/58 (BP Location: Left Arm)   Pulse 69   Temp 98.3 F (36.8 C) (Oral)   Resp 20   Ht 5\' 1"  (1.549 m)   Wt 72.3 kg   SpO2 97%   BMI 30.10 kg/m   Physical Exam Vitals signs and nursing note reviewed.  Constitutional:      General: She is not in acute distress.    Appearance: She is well-developed. She is not diaphoretic.  HENT:     Head: Normocephalic and atraumatic.     Mouth/Throat:     Pharynx: No oropharyngeal exudate or posterior oropharyngeal erythema.  Eyes:     General: No scleral icterus.       Right eye: No discharge.        Left eye: No discharge.     Conjunctiva/sclera: Conjunctivae normal.     Pupils: Pupils are equal, round, and reactive to light.  Neck:     Musculoskeletal:  Normal range of motion and neck supple.     Thyroid: No thyromegaly.  Cardiovascular:     Rate and Rhythm: Normal rate and regular rhythm.     Heart sounds: Normal heart sounds. No murmur. No friction rub. No gallop.   Pulmonary:     Effort: Pulmonary effort is normal. No respiratory distress.     Breath sounds: No stridor. Decreased breath sounds and wheezing (few expiratory) present. No rales.  Abdominal:     General: Bowel sounds are normal. There is no distension.     Palpations: Abdomen is soft.     Tenderness: There is no abdominal tenderness. There is no guarding or rebound.  Lymphadenopathy:     Cervical: No cervical adenopathy.  Skin:    General: Skin is warm and dry.     Coloration: Skin is not pale.     Findings: No rash.  Neurological:     Mental Status: She is alert.     Coordination: Coordination normal.      ED Treatments / Results  Labs (all labs ordered are listed, but only abnormal results are displayed) Labs Reviewed  BASIC METABOLIC PANEL - Abnormal; Notable for the following components:      Result Value   Glucose, Bld 104 (*)    BUN 64 (*)    Creatinine, Ser 3.30 (*)    Calcium 8.6 (*)    GFR calc non Af Amer 13 (*)    GFR calc Af Amer 15 (*)     All other components within normal limits  CBC WITH DIFFERENTIAL/PLATELET - Abnormal; Notable for the following components:   WBC 3.5 (*)    RBC 3.70 (*)    Hemoglobin 11.1 (*)    MCV 100.5 (*)    MCHC 29.8 (*)    Platelets 123 (*)    All other components within normal limits  URINALYSIS, ROUTINE W REFLEX MICROSCOPIC - Abnormal; Notable for the following components:   Protein, ur 30 (*)    Leukocytes,Ua TRACE (*)    Bacteria, UA MANY (*)    All other components within normal limits  BRAIN NATRIURETIC PEPTIDE - Abnormal; Notable for the following components:   B Natriuretic Peptide 3,030.8 (*)    All other components within normal limits  INFLUENZA PANEL BY PCR (TYPE A & B) - Abnormal; Notable for the following components:   Influenza A By PCR POSITIVE (*)    All other components within normal limits  I-STAT TROPONIN, ED - Abnormal; Notable for the following components:   Troponin i, poc 0.09 (*)    All other components within normal limits  CULTURE, BLOOD (ROUTINE X 2)  CULTURE, BLOOD (ROUTINE X 2)  URINE CULTURE  RESPIRATORY PANEL BY PCR  MAGNESIUM  BASIC METABOLIC PANEL  CBC  CBC  CREATININE, SERUM  TSH  TROPONIN I  TROPONIN I  TROPONIN I  HEPATIC FUNCTION PANEL    EKG EKG Interpretation  Date/Time:  Monday June 18 2018 17:44:52 EDT Ventricular Rate:  69 PR Interval:    QRS Duration: 152 QT Interval:  455 QTC Calculation: 488 R Axis:   111 Text Interpretation:  Sinus rhythm Ventricular premature complex Nonspecific intraventricular conduction delay Confirmed by Lennice Sites [phone]) on 06/18/2018 5:48:11 PM   Radiology Dg Chest 2 View  Result Date: 06/18/2018 CLINICAL DATA:  83 y/o  F; cough, shortness of breath, and fatigue. EXAM: CHEST - 2 VIEW COMPARISON:  08/30/2016 chest radiograph. FINDINGS: Stable cardiomegaly given projection and technique. Aortic  calcific atherosclerosis. Interstitial pulmonary edema and patchy basilar opacities. No pleural effusion or  pneumothorax. Flowing anterior ossification of thoracic spine compatible with DISH. No acute osseous abnormality is evident. IMPRESSION: Cardiomegaly and interstitial pulmonary edema. Patchy basilar opacities may represent developing alveolar edema or pneumonia. Electronically Signed   By: Kristine Garbe M.D.   On: 06/18/2018 19:19    Procedures Procedures (including critical care time)  Medications Ordered in ED Medications  allopurinol (ZYLOPRIM) tablet 100 mg (has no administration in time range)  aspirin EC tablet 81 mg (has no administration in time range)  hydrALAZINE (APRESOLINE) tablet 25 mg (has no administration in time range)  nitroGLYCERIN (NITROSTAT) SL tablet 0.4 mg (has no administration in time range)  latanoprost (XALATAN) 0.005 % ophthalmic solution 1 drop (has no administration in time range)  acetaminophen (TYLENOL) tablet 650 mg (has no administration in time range)    Or  acetaminophen (TYLENOL) suppository 650 mg (has no administration in time range)  ondansetron (ZOFRAN) tablet 4 mg (has no administration in time range)    Or  ondansetron (ZOFRAN) injection 4 mg (has no administration in time range)  heparin injection 5,000 Units (has no administration in time range)  levalbuterol (XOPENEX) nebulizer solution 0.63 mg (has no administration in time range)  levalbuterol (XOPENEX) nebulizer solution 0.63 mg (has no administration in time range)  budesonide (PULMICORT) nebulizer solution 0.25 mg (has no administration in time range)  furosemide (LASIX) injection 40 mg (has no administration in time range)  oseltamivir (TAMIFLU) capsule 30 mg (has no administration in time range)  cefTRIAXone (ROCEPHIN) 1 g in sodium chloride 0.9 % 100 mL IVPB (0 g Intravenous Stopped 06/18/18 2309)  azithromycin (ZITHROMAX) 500 mg in sodium chloride 0.9 % 250 mL IVPB (0 mg Intravenous Stopped 06/18/18 2309)     Initial Impression / Assessment and Plan / ED Course  I have  reviewed the triage vital signs and the nursing notes.  Pertinent labs & imaging results that were available during my care of the patient were reviewed by me and considered in my medical decision making (see chart for details).        Patient presenting with elevated troponin in the setting of probable fluid overload, influenza, and possible pneumonia.  Patient also found to have probable UTI.  Rocephin and azithromycin initiated in the ED.  Blood cultures pending.  BNP 3030.8.  Suspect troponin elevation at 0.09 related to fluid overload.  Continue to monitor for bigeminy and trigeminy during admission as it was not seen on telemetry in the ED, however reportedly seen prior to arrival at clinic.  Influenza A positive at the time of this note, not returned at time of admission.  I discussed patient case with Dr. Hal Hope with [facility] who accepts patient for admission.  I appreciate his assistance with the patient.  Patient also evaluated by my attending, Dr. Ronnald Nian, who guided the patient's management and agrees with plan.  Final Clinical Impressions(s) / ED Diagnoses   Final diagnoses:  Acute pulmonary edema [facility])    ED Discharge Orders    None       Frederica Kuster, PA-C 06/19/18 0030    Lennice Sites, DO 06/19/18 0117

## 2018-06-19 ENCOUNTER — Encounter (HOSPITAL_COMMUNITY): Payer: Self-pay | Admitting: Internal Medicine

## 2018-06-19 DIAGNOSIS — I5033 Acute on chronic diastolic (congestive) heart failure: Secondary | ICD-10-CM

## 2018-06-19 DIAGNOSIS — J101 Influenza due to other identified influenza virus with other respiratory manifestations: Secondary | ICD-10-CM

## 2018-06-19 DIAGNOSIS — J9601 Acute respiratory failure with hypoxia: Secondary | ICD-10-CM

## 2018-06-19 DIAGNOSIS — I10 Essential (primary) hypertension: Secondary | ICD-10-CM

## 2018-06-19 DIAGNOSIS — N184 Chronic kidney disease, stage 4 (severe): Secondary | ICD-10-CM

## 2018-06-19 DIAGNOSIS — G4730 Sleep apnea, unspecified: Secondary | ICD-10-CM

## 2018-06-19 DIAGNOSIS — E1122 Type 2 diabetes mellitus with diabetic chronic kidney disease: Secondary | ICD-10-CM

## 2018-06-19 LAB — PTH, INTACT AND CALCIUM
Calcium, Total (PTH): 8.5 mg/dL — ABNORMAL LOW (ref 8.7–10.3)
PTH: 178 pg/mL — ABNORMAL HIGH (ref 15–65)

## 2018-06-19 LAB — CBC
HCT: 33.6 % — ABNORMAL LOW (ref 36.0–46.0)
Hemoglobin: 10.2 g/dL — ABNORMAL LOW (ref 12.0–15.0)
MCH: 29.9 pg (ref 26.0–34.0)
MCHC: 30.4 g/dL (ref 30.0–36.0)
MCV: 98.5 fL (ref 80.0–100.0)
Platelets: 130 10*3/uL — ABNORMAL LOW (ref 150–400)
RBC: 3.41 MIL/uL — ABNORMAL LOW (ref 3.87–5.11)
RDW: 14.6 % (ref 11.5–15.5)
WBC: 4.9 10*3/uL (ref 4.0–10.5)
nRBC: 0 % (ref 0.0–0.2)

## 2018-06-19 LAB — HEPATIC FUNCTION PANEL
ALT: 17 U/L (ref 0–44)
AST: 24 U/L (ref 15–41)
Albumin: 2.9 g/dL — ABNORMAL LOW (ref 3.5–5.0)
Alkaline Phosphatase: 53 U/L (ref 38–126)
Bilirubin, Direct: 0.2 mg/dL (ref 0.0–0.2)
Indirect Bilirubin: 0.3 mg/dL (ref 0.3–0.9)
Total Bilirubin: 0.5 mg/dL (ref 0.3–1.2)
Total Protein: 5.4 g/dL — ABNORMAL LOW (ref 6.5–8.1)

## 2018-06-19 LAB — TROPONIN I
Troponin I: 0.09 ng/mL (ref <0.03)
Troponin I: 0.1 ng/mL (ref <0.03)
Troponin I: 0.09 ng/mL (ref <0.03)

## 2018-06-19 LAB — RESPIRATORY PANEL BY PCR

## 2018-06-19 LAB — BASIC METABOLIC PANEL
Anion gap: 12 (ref 5–15)
BUN: 60 mg/dL — ABNORMAL HIGH (ref 8–23)
CO2: 24 mmol/L (ref 22–32)
Calcium: 8.6 mg/dL — ABNORMAL LOW (ref 8.9–10.3)
Chloride: 108 mmol/L (ref 98–111)
Creatinine, Ser: 3.17 mg/dL — ABNORMAL HIGH (ref 0.44–1.00)
GFR calc Af Amer: 15 mL/min — ABNORMAL LOW (ref >60)
GFR calc non Af Amer: 13 mL/min — ABNORMAL LOW (ref >60)
Glucose, Bld: 89 mg/dL (ref 70–99)
Potassium: 3.3 mmol/L — ABNORMAL LOW (ref 3.5–5.1)
Sodium: 144 mmol/L (ref 135–145)

## 2018-06-19 LAB — TSH: TSH: 2.25 u[IU]/mL (ref 0.350–4.500)

## 2018-06-19 LAB — INFLUENZA PANEL BY PCR (TYPE A & B)
Influenza A By PCR: POSITIVE — AB
Influenza B By PCR: NEGATIVE

## 2018-06-19 MED ORDER — HEPARIN SODIUM (PORCINE) 5000 UNIT/ML IJ SOLN
5000.0000 [IU] | Freq: Three times a day (TID) | INTRAMUSCULAR | Status: DC
  Administered 2018-06-19 – 2018-06-25 (×19): 5000 [IU] via SUBCUTANEOUS
  Filled 2018-06-19 (×19): qty 1

## 2018-06-19 MED ORDER — ACETAMINOPHEN 325 MG PO TABS
650.0000 mg | ORAL_TABLET | Freq: Four times a day (QID) | ORAL | Status: DC | PRN
  Administered 2018-06-24: 650 mg via ORAL
  Filled 2018-06-19: qty 2

## 2018-06-19 MED ORDER — HYDRALAZINE HCL 25 MG PO TABS
25.0000 mg | ORAL_TABLET | Freq: Two times a day (BID) | ORAL | Status: DC
  Administered 2018-06-19 – 2018-06-25 (×13): 25 mg via ORAL
  Filled 2018-06-19 (×13): qty 1

## 2018-06-19 MED ORDER — OSELTAMIVIR PHOSPHATE 30 MG PO CAPS
30.0000 mg | ORAL_CAPSULE | Freq: Every day | ORAL | Status: AC
  Administered 2018-06-19 – 2018-06-22 (×5): 30 mg via ORAL
  Filled 2018-06-19 (×5): qty 1

## 2018-06-19 MED ORDER — ACETAMINOPHEN 650 MG RE SUPP: 650.0000 mg | Freq: Four times a day (QID) | RECTAL | Status: DC | PRN

## 2018-06-19 MED ORDER — FUROSEMIDE 10 MG/ML IJ SOLN
80.0000 mg | Freq: Two times a day (BID) | INTRAMUSCULAR | Status: DC
  Administered 2018-06-19 – 2018-06-21 (×5): 80 mg via INTRAVENOUS
  Filled 2018-06-19 (×5): qty 8

## 2018-06-19 MED ORDER — LATANOPROST 0.005 % OP SOLN
1.0000 [drp] | Freq: Every day | OPHTHALMIC | Status: DC
  Administered 2018-06-19 – 2018-06-24 (×7): 1 [drp] via OPHTHALMIC
  Filled 2018-06-19: qty 2.5

## 2018-06-19 MED ORDER — FLUTICASONE PROPIONATE 50 MCG/ACT NA SUSP
2.0000 | Freq: Every day | NASAL | Status: DC
  Administered 2018-06-19 – 2018-06-25 (×7): 2 via NASAL
  Filled 2018-06-19: qty 16

## 2018-06-19 MED ORDER — BUDESONIDE 0.5 MG/2ML IN SUSP
0.5000 mg | Freq: Two times a day (BID) | RESPIRATORY_TRACT | Status: DC
  Administered 2018-06-19 – 2018-06-25 (×11): 0.5 mg via RESPIRATORY_TRACT
  Filled 2018-06-19 (×13): qty 2

## 2018-06-19 MED ORDER — BUDESONIDE 0.25 MG/2ML IN SUSP
0.2500 mg | Freq: Two times a day (BID) | RESPIRATORY_TRACT | Status: DC
  Administered 2018-06-19: 0.25 mg via RESPIRATORY_TRACT
  Filled 2018-06-19: qty 2

## 2018-06-19 MED ORDER — NEPRO/CARBSTEADY PO LIQD
237.0000 mL | Freq: Two times a day (BID) | ORAL | Status: DC
  Filled 2018-06-19: qty 237

## 2018-06-19 MED ORDER — LEVALBUTEROL HCL 0.63 MG/3ML IN NEBU
0.6300 mg | INHALATION_SOLUTION | Freq: Three times a day (TID) | RESPIRATORY_TRACT | Status: DC
  Administered 2018-06-19: 0.63 mg via RESPIRATORY_TRACT
  Filled 2018-06-19 (×2): qty 3

## 2018-06-19 MED ORDER — POTASSIUM CHLORIDE CRYS ER 20 MEQ PO TBCR
20.0000 meq | EXTENDED_RELEASE_TABLET | Freq: Once | ORAL | Status: AC
  Administered 2018-06-19: 20 meq via ORAL
  Filled 2018-06-19: qty 1

## 2018-06-19 MED ORDER — ALLOPURINOL 100 MG PO TABS
100.0000 mg | ORAL_TABLET | Freq: Two times a day (BID) | ORAL | Status: DC
  Administered 2018-06-19 – 2018-06-25 (×13): 100 mg via ORAL
  Filled 2018-06-19 (×13): qty 1

## 2018-06-19 MED ORDER — ONDANSETRON HCL 4 MG PO TABS: 4.0000 mg | ORAL_TABLET | Freq: Four times a day (QID) | ORAL | Status: DC | PRN

## 2018-06-19 MED ORDER — PRO-STAT SUGAR FREE PO LIQD
30.0000 mL | Freq: Two times a day (BID) | ORAL | Status: DC
  Administered 2018-06-19 – 2018-06-20 (×3): 30 mL via ORAL
  Filled 2018-06-19 (×6): qty 30

## 2018-06-19 MED ORDER — NITROGLYCERIN 0.4 MG SL SUBL: 0.4000 mg | SUBLINGUAL_TABLET | SUBLINGUAL | Status: DC | PRN

## 2018-06-19 MED ORDER — NEPRO/CARBSTEADY PO LIQD
237.0000 mL | Freq: Two times a day (BID) | ORAL | Status: DC
  Administered 2018-06-19: 237 mL via ORAL
  Filled 2018-06-19 (×2): qty 237

## 2018-06-19 MED ORDER — ONDANSETRON HCL 4 MG/2ML IJ SOLN
4.0000 mg | Freq: Four times a day (QID) | INTRAMUSCULAR | Status: DC | PRN
  Filled 2018-06-19: qty 2

## 2018-06-19 MED ORDER — LORATADINE 10 MG PO TABS
10.0000 mg | ORAL_TABLET | Freq: Every day | ORAL | Status: DC
  Administered 2018-06-19 – 2018-06-25 (×7): 10 mg via ORAL
  Filled 2018-06-19 (×7): qty 1

## 2018-06-19 MED ORDER — FUROSEMIDE 10 MG/ML IJ SOLN: 40.0000 mg | Freq: Two times a day (BID) | INTRAMUSCULAR | Status: DC

## 2018-06-19 MED ORDER — LEVALBUTEROL HCL 0.63 MG/3ML IN NEBU
0.6300 mg | INHALATION_SOLUTION | Freq: Four times a day (QID) | RESPIRATORY_TRACT | Status: DC
  Administered 2018-06-19: 0.63 mg via RESPIRATORY_TRACT
  Filled 2018-06-19: qty 3

## 2018-06-19 MED ORDER — LEVALBUTEROL HCL 0.63 MG/3ML IN NEBU
0.6300 mg | INHALATION_SOLUTION | Freq: Four times a day (QID) | RESPIRATORY_TRACT | Status: DC | PRN
  Administered 2018-06-20: 0.63 mg via RESPIRATORY_TRACT
  Filled 2018-06-19: qty 3

## 2018-06-19 MED ORDER — BOOST / RESOURCE BREEZE PO LIQD CUSTOM
1.0000 | Freq: Two times a day (BID) | ORAL | Status: DC
  Administered 2018-06-20: 1 via ORAL

## 2018-06-19 MED ORDER — ASPIRIN EC 81 MG PO TBEC
81.0000 mg | DELAYED_RELEASE_TABLET | Freq: Every day | ORAL | Status: DC
  Administered 2018-06-19 – 2018-06-25 (×7): 81 mg via ORAL
  Filled 2018-06-19 (×7): qty 1

## 2018-06-19 NOTE — H&P (Signed)
History and Physical    Doris Howell YCX:[id number] DOB: Oct 17, 1935 DOA: 06/18/2018  PCP: Lavone Orn, MD  Patient coming from: Home.  Chief Complaint: Shortness of breath.  HPI: Doris Howell is a 83 y.o. female with history of CHF, chronic kidney disease stage IV, gout had gone for her IV iron infusion when patient was found to be bradycardic and was referred to the ER.  Patient states over the last 4 days patient has been short of breath with nonproductive cough and chest tightness.  Patient's family member also was having respiratory illness.  ED Course: In the ER patient is found to be wheezing chest x-ray shows congestion and possible infiltrates mildly febrile.  Labs reveal creatinine of 3.3 WBC of 3.5 hemoglobin 11.1 platelets 123.  BNP was 3000.  Troponin mildly elevated.  Patient was started on empiric antibiotics and flu panel turned out to be positive.  Review of Systems: As per HPI, rest all negative.   Past Medical History:  Diagnosis Date  . CAP (community acquired pneumonia) 01/09/2014   "first time I've ever had it"  . Diastolic congestive heart failure ([place])   . GERD (gastroesophageal reflux disease)   . High cholesterol   . History of gout   . Hypertension   . Kidney stones   . Type II diabetes mellitus with stage 4 chronic kidney disease (HCC)     Past Surgical History:  Procedure Laterality Date  . ABDOMINAL HYSTERECTOMY  ~ 1974  . CATARACT EXTRACTION W/ INTRAOCULAR LENS  IMPLANT, BILATERAL Bilateral 2014  . COLONOSCOPY WITH PROPOFOL N/A 10/23/2012   Procedure: COLONOSCOPY WITH PROPOFOL;  Surgeon: [name], MD;  [facility] ENDOS[facility];  Service: Endoscopy;  Laterality: N/A;  . KIDNEY STONE SURGERY  10-05-12   "cut me on the side"  . LITHOTRIPSY  1980's?  . RIGHT HEART CATHETERIZATION N/A 04/15/2014   Procedure: RIGHT HEART CATH;  Surgeon: Sanda Klein, MD;  Location: [facility] CATH LAB;  Service: Cardiovascular;  Laterality: N/A;     reports that she has never smoked. She has never used smokeless tobacco. She reports that she does not drink alcohol or use drugs.  Allergies  Allergen Reactions  . Other Nausea Only    pholcodeine  . Simvastatin Other (See Comments)    Myalgias- MD took patient off med Myalgias- MD took patient off med  . Codeine Other (See Comments)    Unknown; pt can't remember. It was in the '70s Unknown; pt can't remember. It was in the '70s    Family History  Problem Relation Age of Onset  . Hypertension Mother   . Diabetes Brother   . CAD Neg Hx     Prior to Admission medications   Medication Sig Start Date End Date Taking? Authorizing Provider  albuterol (PROVENTIL HFA;VENTOLIN HFA) 108 (90 BASE) MCG/ACT inhaler Inhale 2 puffs into the lungs every 6 (six) hours as needed for wheezing or shortness of breath. 01/13/14  Yes [name], MD  allopurinol (ZYLOPRIM) 100 MG tablet Take 100 mg by mouth 2 (two) times daily.   Yes [provider]  aspirin EC 81 MG tablet Take 81 mg by mouth daily.   Yes [provider]  carvedilol (COREG) 12.5 MG tablet Take 12.5 mg by mouth 2 (two) times daily with a meal.   Yes [provider]  hydrALAZINE (APRESOLINE) 25 MG tablet Take 25 mg by mouth 2 (two) times daily.    Yes [provider]  latanoprost (  XALATAN) 0.005 % ophthalmic solution Place 1 drop into both eyes at bedtime.   Yes [provider]  nitroGLYCERIN (NITROSTAT) 0.4 MG SL tablet Place 1 tablet (0.4 mg total) under the tongue every 5 (five) minutes as needed for chest pain. 04/24/18  Yes Nahser, Wonda Cheng, MD  torsemide (DEMADEX) 20 MG tablet Take 1-2 tablets (20-40 mg total) by mouth 2 (two) times daily. Take 2 tabs daily at 8 AM and 1 tab daily after lunch at 1 PM. Patient taking differently: Take 20-40 mg by mouth See admin instructions. Take 40mg  daily at 8 AM and 20mg  in the evening 09/11/14  Yes Hongalgi, Lenis Dickinson, MD  Vitamin D, Cholecalciferol, 400  UNITS CAPS Take 2 capsules by mouth daily.   Yes [provider]    Physical Exam: Vitals:   06/18/18 2146 06/18/18 2230 06/18/18 2350 06/18/18 2352  BP: (!) 142/71 139/69  (!) 148/58  Pulse: (!) 50 63  69  Resp: (!) 23 (!) 23  20  Temp:    98.3 F (36.8 C)  TempSrc:    Oral  SpO2: 95% 96%  97%  Weight:   72.3 kg   Height:   5\' 1"  (1.549 m)       Constitutional: Moderately built and nourished. Vitals:   06/18/18 2146 06/18/18 2230 06/18/18 2350 06/18/18 2352  BP: (!) 142/71 139/69  (!) 148/58  Pulse: (!) 50 63  69  Resp: (!) 23 (!) 23  20  Temp:    98.3 F (36.8 C)  TempSrc:    Oral  SpO2: 95% 96%  97%  Weight:   72.3 kg   Height:   5\' 1"  (1.549 m)    Eyes: Anicteric no pallor. ENMT: No discharge from the ears eyes nose or mouth. Neck: No mass felt.  No neck rigidity. Respiratory: Bilateral expiratory wheeze and no crepitations. Cardiovascular: S1-S2 heard. Abdomen: Soft nontender bowel sounds present. Musculoskeletal: No edema. Skin: No rash. Neurologic: Alert awake oriented to time place and person.  Moves all extremities. Psychiatric: Appears normal.  Normal affect.   Labs on Admission: I have personally reviewed following labs and imaging studies  CBC: Recent Labs  Lab 06/18/18 1323 06/18/18 1806  WBC  --  3.5*  NEUTROABS  --  2.3  HGB 11.5* 11.1*  HCT  --  37.2  MCV  --  100.5*  PLT  --  240*   Basic Metabolic Panel: Recent Labs  Lab 06/18/18 1415 06/18/18 1806 06/18/18 1820  NA 143 144  --   K 3.2* 4.3  --   CL 108 109  --   CO2 26 24  --   GLUCOSE 145* 104*  --   BUN 64* 64*  --   CREATININE 3.25* 3.30*  --   CALCIUM 8.5* 8.6*  --   MG  --   --  2.2  PHOS 3.2  --   --    GFR: Estimated Creatinine Clearance: 12.4 mL/min (A) (by C-G formula based on SCr of 3.3 mg/dL (H)). Liver Function Tests: Recent Labs  Lab 06/18/18 1415  ALBUMIN 3.3*   No results for input(s): LIPASE, AMYLASE in the last 168 hours. No results for  input(s): AMMONIA in the last 168 hours. Coagulation Profile: No results for input(s): INR, PROTIME in the last 168 hours. Cardiac Enzymes: No results for input(s): CKTOTAL, CKMB, CKMBINDEX, TROPONINI in the last 168 hours. BNP (last 3 results) No results for input(s): PROBNP in the last 8760 hours.  HbA1C: No results for input(s): HGBA1C in the last 72 hours. CBG: No results for input(s): GLUCAP in the last 168 hours. Lipid Profile: No results for input(s): CHOL, HDL, LDLCALC, TRIG, CHOLHDL, LDLDIRECT in the last 72 hours. Thyroid Function Tests: No results for input(s): TSH, T4TOTAL, FREET4, T3FREE, THYROIDAB in the last 72 hours. Anemia Panel: Recent Labs    06/18/18 1415  FERRITIN 323*  TIBC 267  IRON 51   Urine analysis:    Component Value Date/Time   COLORURINE YELLOW 06/18/2018 2124   APPEARANCEUR CLEAR 06/18/2018 2124   LABSPEC 1.011 06/18/2018 2124   PHURINE 5.0 06/18/2018 2124   GLUCOSEU NEGATIVE 06/18/2018 2124   HGBUR NEGATIVE 06/18/2018 2124   [place] NEGATIVE 06/18/2018 2124   [place] NEGATIVE 06/18/2018 2124   PROTEINUR 30 (A) 06/18/2018 2124   UROBILINOGEN 0.2 08/29/2014 1848   NITRITE NEGATIVE 06/18/2018 2124   LEUKOCYTESUR TRACE (A) 06/18/2018 2124   Sepsis Labs: @LABRCNTIP (procalcitonin:4,lacticidven:4) )No results found for this or any previous visit (from the past 240 hour(s)).   Radiological Exams on Admission: Dg Chest 2 View  Result Date: 06/18/2018 CLINICAL DATA:  83 y/o  F; cough, shortness of breath, and fatigue. EXAM: CHEST - 2 VIEW COMPARISON:  08/30/2016 chest radiograph. FINDINGS: Stable cardiomegaly given projection and technique. Aortic calcific atherosclerosis. Interstitial pulmonary edema and patchy basilar opacities. No pleural effusion or pneumothorax. Flowing anterior ossification of thoracic spine compatible with DISH. No acute osseous abnormality is evident. IMPRESSION: Cardiomegaly and interstitial pulmonary edema. Patchy  basilar opacities may represent developing alveolar edema or pneumonia. Electronically Signed   By: Kristine Garbe M.D.   On: 06/18/2018 19:19    EKG: Independently reviewed.  Normal sinus rhythm VPC.  Assessment/Plan Principal Problem:   Acute respiratory failure with hypoxia (HCC) Active Problems:   Diastolic CHF, acute on chronic (HCC)   Type 2 diabetes mellitus with stage 4 chronic kidney disease (HCC)   Essential hypertension   Gout   Sleep apnea   Pulmonary hypertension (HCC)   Influenza A    1. Acute respiratory failure with hypoxia likely a combination of influenza and CHF.  Patient is placed on Tamiflu dosed per pharmacy.  I have placed patient on Lasix 40 mg IV every 12.  Closely follow intake output and metabolic panel.  Check daily weights. 2. Bradycardic episodes will hold beta-blockers and check TSH and monitor in telemetry. 3. Hypertension on hydralazine.  Holding beta-blockers due to bradycardia.  On Lasix. 4. History of gout on allopurinol. 5. Chronic disease stage V being followed by nephrologist.  On Lasix. 6. Anemia likely from renal disease appears to be chronic follow CBC. 7. Thrombocytopenia follow CBC.   DVT prophylaxis: Heparin. Code Status: Full code. Family Communication: Family at the bedside. Disposition Plan: Home. Consults called: None. Admission status: Inpatient.   Rise Patience MD [facility] Pager [phone].  If 7PM-7AM, please contact night-coverage [email or web address] Password [email or web address]  06/19/2018, 12:18 AM

## 2018-06-19 NOTE — Progress Notes (Signed)
Patient arrived to unit with family.  VSS. Call bell within reach and bed alarm on with bed in lowest position.  Fall risk bracelet on patient and yellow socks in room.  Patient placed on telemetry, CCMD called and verified with Debbie, NT.

## 2018-06-19 NOTE — Progress Notes (Signed)
Auto CPAP set up for pt (Min 4cmH20-max 16cmH20) with nasal mask. Pt unable to tolerate current settings. Pt states she has not been able to tolerate CPAP at home because mask doesn't fit her right. Advised pt to notify for RT if she wants to attempt wearing CPAP at a later time.

## 2018-06-19 NOTE — Plan of Care (Signed)
  Problem: Education: Goal: Knowledge of General Education information will improve Description Including pain rating scale, medication(s)/side effects and non-pharmacologic comfort measures Outcome: Progressing   Problem: Health Behavior/Discharge Planning: Goal: Ability to manage health-related needs will improve Outcome: Progressing   Problem: Clinical Measurements: Goal: Respiratory complications will improve Outcome: Progressing   Problem: Nutrition: Goal: Adequate nutrition will be maintained Outcome: Progressing   Problem: Activity: Goal: Risk for activity intolerance will decrease Outcome: Progressing

## 2018-06-19 NOTE — Progress Notes (Signed)
Pt declined use of CPAP for tonight.  Machine at bedside for patient use.

## 2018-06-19 NOTE — Progress Notes (Signed)
Initial Nutrition Assessment  DOCUMENTATION CODES:   Not applicable  INTERVENTION:   -D/c Nepro Shake po BID, each supplement provides 425 kcal and 19 grams protein -MVI with minerals daily -Boost Breeze po BID, each supplement provides 250 kcal and 9 grams of protein -30 ml Prostat BID, each supplement provides 100 kcals and 15 grams protein -Liberalize diet to 2 gram sodium with 1.2 L fluid restriction (received verbal permission from Dr. Grandville Silos)  NUTRITION DIAGNOSIS:   Inadequate oral intake related to decreased appetite as evidenced by meal completion < 25%.  GOAL:   Patient will meet greater than or equal to 90% of their needs  MONITOR:   PO intake, Supplement acceptance, Labs, Weight trends, Skin, I & O's  REASON FOR ASSESSMENT:   Malnutrition Screening Tool    ASSESSMENT:   Doris Howell is a 83 y.o. female with history of CHF, chronic kidney disease stage IV, gout had gone for her IV iron infusion when patient was found to be bradycardic and was referred to the ER.  Patient states over the last 4 days patient has been short of breath with nonproductive cough and chest tightness.  Patient's family member also was having respiratory illness.  Pt admitted with acute respiratory failure likely secondary to CHF and influenza.   Reviewed I/O's: -228 ml x 24 hours  Case discussed with RN, who reports pt with poor appetite- she did not care for food on multiple lunch trays and is also refusing Nepro supplements. RN reports that only food that pt is asking for is hot dogs and sauerkraut.   Spoke with pt and multiple family members at bedside. Pt shares that she has ongoing poor oral intake over the past several months secondary to decreased appetite and taste changes. She started noticing changes in intake several months ago when she was hospitalized with CHF and this never improved. She currently consumes one meal per day, but often only consumes bites and sips  during each meal. Family members at bedside share concern that pt's favorite foods (fried fish) are no longer appealing to her. Pt tried to consume some chicken salad, however, this did not taste right to her. She does not like Nepro supplements due to thick aftertaste and refuses Ensure Enlive. Pt also frustrated that she is unable to have foods such as orange juice due to being on a renal diet.   Pt estimates she has lost about 5-10#. She reports her UBW is around 165#. Reviewed wt hx, which reveals pt has experienced a 4.9% wt loss over the past month, which while not significant for time frame is concerning given poor oral intake. Pt also reports functional decline, having "some random falls" over the past few weeks at home.   Discussed importance of good meal and supplement intake to promote healing. Pt amenable to Boost Breeze supplements. Will also try protein modular secondary to fluid restriction.   Labs reviewed: K: 3.3.   NUTRITION - FOCUSED PHYSICAL EXAM:    Most Recent Value  Orbital Region  No depletion  Upper Arm Region  No depletion  Thoracic and Lumbar Region  No depletion  Buccal Region  No depletion  Temple Region  No depletion  Clavicle Bone Region  No depletion  Clavicle and Acromion Bone Region  No depletion  Scapular Bone Region  No depletion  Dorsal Hand  No depletion  Patellar Region  No depletion  Anterior Thigh Region  No depletion  Posterior Calf Region  No depletion  Edema (RD Assessment)  Mild  Hair  Reviewed  Eyes  Reviewed  Mouth  Reviewed  Skin  Reviewed  Nails  Reviewed       Diet Order:   Diet Order            Diet 2 gram sodium Room service appropriate? Yes; Fluid consistency: Thin; Fluid restriction: 1200 mL Fluid  Diet effective now              EDUCATION NEEDS:   Education needs have been addressed  Skin:  Skin Assessment: Reviewed RN Assessment  Last BM:  06/19/18  Height:   Ht Readings from Last 1 Encounters:  06/18/18 5'  1" (1.549 m)    Weight:   Wt Readings from Last 1 Encounters:  06/19/18 71.6 kg    Ideal Body Weight:  47.7 kg  BMI:  Body mass index is 29.83 kg/m.  Estimated Nutritional Needs:   Kcal:  1700-1900  Protein:  85-100 grams  Fluid:  1.2 L    Nashayla Telleria A. Jimmye Norman, RD, LDN, [place] Registered Dietitian II Certified Diabetes Care and Education Specialist Pager: [phone] After hours Pager: [phone]

## 2018-06-19 NOTE — Progress Notes (Signed)
Have seen and assessed patient and agree with Dr. Moise Boring assessment and plan.  Patient is a 83 year old female history of CHF, chronic kidney disease stage IV, gout who had gone for IV iron infusion when she was noted to be bradycardic referred to the ED.  Patient with complaints of 4 days of shortness of breath, nonproductive cough and chest tightness.  Work-up in the ED showed a BNP of 3000, chest x-ray with congestion and possible infiltrates, patient wheezing.  Influenza PCR was positive.  Patient placed on Tamiflu and IV diuretics.  Patient noted to be on Demadex 40 mg in the morning and 20 mg at lunchtime.  Increase Lasix to 80 mg IV every 12 hours.  Strict I's and O's.  Daily weights.  Continue supportive care.  Follow.  No charge.

## 2018-06-20 DIAGNOSIS — M1040 Other secondary gout, unspecified site: Secondary | ICD-10-CM

## 2018-06-20 DIAGNOSIS — I272 Pulmonary hypertension, unspecified: Secondary | ICD-10-CM

## 2018-06-20 LAB — CBC WITH DIFFERENTIAL/PLATELET
Abs Immature Granulocytes: 0.05 10*3/uL (ref 0.00–0.07)
Basophils Absolute: 0 10*3/uL (ref 0.0–0.1)
Basophils Relative: 0 %
Eosinophils Absolute: 0.1 10*3/uL (ref 0.0–0.5)
Eosinophils Relative: 2 %
HCT: 34.5 % — ABNORMAL LOW (ref 36.0–46.0)
Hemoglobin: 10.4 g/dL — ABNORMAL LOW (ref 12.0–15.0)
Immature Granulocytes: 1 %
Lymphocytes Relative: 10 %
Lymphs Abs: 0.6 10*3/uL — ABNORMAL LOW (ref 0.7–4.0)
MCH: 29.5 pg (ref 26.0–34.0)
MCHC: 30.1 g/dL (ref 30.0–36.0)
MCV: 97.7 fL (ref 80.0–100.0)
Monocytes Absolute: 0.5 10*3/uL (ref 0.1–1.0)
Monocytes Relative: 9 %
Neutro Abs: 4.5 10*3/uL (ref 1.7–7.7)
Neutrophils Relative %: 78 %
Platelets: 150 10*3/uL (ref 150–400)
RBC: 3.53 MIL/uL — ABNORMAL LOW (ref 3.87–5.11)
RDW: 14.6 % (ref 11.5–15.5)
WBC: 5.7 10*3/uL (ref 4.0–10.5)
nRBC: 0 % (ref 0.0–0.2)

## 2018-06-20 LAB — HEMOGLOBIN A1C
Hgb A1c MFr Bld: 5.9 % — ABNORMAL HIGH (ref 4.8–5.6)
Mean Plasma Glucose: 122.63 mg/dL

## 2018-06-20 LAB — RENAL FUNCTION PANEL
Albumin: 3 g/dL — ABNORMAL LOW (ref 3.5–5.0)
Anion gap: 10 (ref 5–15)
BUN: 54 mg/dL — ABNORMAL HIGH (ref 8–23)
CO2: 26 mmol/L (ref 22–32)
Calcium: 9.1 mg/dL (ref 8.9–10.3)
Chloride: 108 mmol/L (ref 98–111)
Creatinine, Ser: 3.04 mg/dL — ABNORMAL HIGH (ref 0.44–1.00)
GFR calc Af Amer: 16 mL/min — ABNORMAL LOW (ref >60)
GFR calc non Af Amer: 14 mL/min — ABNORMAL LOW (ref >60)
Glucose, Bld: 94 mg/dL (ref 70–99)
Phosphorus: 2.7 mg/dL (ref 2.5–4.6)
Potassium: 3.4 mmol/L — ABNORMAL LOW (ref 3.5–5.1)
Sodium: 144 mmol/L (ref 135–145)

## 2018-06-20 LAB — URINE CULTURE: Culture: >=100000 — AB

## 2018-06-20 MED ORDER — GUAIFENESIN 100 MG/5ML PO SOLN
5.0000 mL | ORAL | Status: DC | PRN
  Administered 2018-06-20 – 2018-06-21 (×4): 100 mg via ORAL
  Filled 2018-06-20 (×4): qty 5

## 2018-06-20 NOTE — Progress Notes (Signed)
Patient declined use of CPAP at this time. Patient also states she does not use CPAP at home at this time. She is unable to tolerate and makes her feel "suffocated" and hard to breathe. Patient encouraged to call for RT if she would like to use CPAP at a later time. Equipment set up at bedside.

## 2018-06-20 NOTE — Plan of Care (Signed)
  Problem: Education: Goal: Knowledge of General Education information will improve Description Including pain rating scale, medication(s)/side effects and non-pharmacologic comfort measures Outcome: Progressing   Problem: Health Behavior/Discharge Planning: Goal: Ability to manage health-related needs will improve Outcome: Progressing   

## 2018-06-20 NOTE — Progress Notes (Signed)
PROGRESS NOTE    Doris Howell  PRF:[id number] DOB: 05/15/1935 DOA: 06/18/2018 PCP: Lavone Orn, MD    Brief Narrative:  83 year old female who presented with dyspnea, she does have significant past medical history for heart failure, chronic kidney disease stage IV who was found bradycardic, and was referred to the emergency room.  For the last 4 days she has been experiencing dyspnea with nonproductive cough and chest tightness.  She will physical examination blood pressure 142/71, heart rate 50, respiratory rate 23, temperature 98.3, oxygen saturation 95% her lungs had bilateral expiratory wheezing, no rales, heart S1-S2 present with me, abdomen soft nontender, no lower extremity edema.  Influenza A positive. Chest x-ray had cardiomegaly with vascular congestion, bilateral interstitial infiltrates.  EKG sinus rhythm, 69 bpm, normal axis, left bundle branch block.   Patient was admitted to the hospital working diagnosis of acute decompensated heart failure.    Assessment & Plan:   Principal Problem:   Acute respiratory failure with hypoxia (HCC) Active Problems:   Diastolic CHF, acute on chronic (HCC)   Type 2 diabetes mellitus with stage 4 chronic kidney disease (HCC)   Essential hypertension   Gout   Sleep apnea   Pulmonary hypertension (HCC)   Influenza A   1. Acute hypoxic respiratory failure due to decompensated diastolic heart failure. Urine output over last 24 H, 2,150 ml, will continue diuresis with IV furosemide to target further negative fluid balance. Continue after load reduction with hydralazine.   2. Acute influenza A. Patient has remained afebrile, but severe malaise and generalized weakness, poor oral intake and debility. Will continue Oseltamivir.   3. T2DM. Will continue glucose cover and monitoring, with insulin sliding scale. Patient with poor oral intake.   4. HTN. Continue blood pressure control with hydralazine.   DVT prophylaxis: enoxaparin   Code  Status: full Family Communication: no family at the bedside  Disposition Plan/ discharge barriers: pending clinical improvement.   Body mass index is 29.55 kg/m. Malnutrition Type:  Nutrition Problem: Inadequate oral intake Etiology: decreased appetite   Malnutrition Characteristics:  Signs/Symptoms: meal completion < 25%   Nutrition Interventions:  Interventions: Boost Breeze, MVI, Prostat  RN Pressure Injury Documentation:     Consultants:     Procedures:     Antimicrobials:   Oseltamivir.     Subjective: Patient continue to have dyspnea, severe malaise and generalized weakness, no chest pain, no nausea or vomiting. Has been getting out of bed with assistance.   Objective: Vitals:   06/19/18 2335 06/20/18 0349 06/20/18 0918 06/20/18 1125  BP: (!) 131/58 132/60 (!) 146/69 129/82  Pulse: 67 60  72  Resp: (!) 22 20  20   Temp: 99.4 F (37.4 C) 98.1 F (36.7 C)  98.3 F (36.8 C)  TempSrc: Oral Oral  Oral  SpO2: 96% 98%  98%  Weight:  70.9 kg    Height:        Intake/Output Summary (Last 24 hours) at 06/20/2018 1256 Last data filed at 06/20/2018 1202 Gross per 24 hour  Intake 600 ml  Output 2350 ml  Net -1750 ml   Filed Weights   06/18/18 2350 06/19/18 0527 06/20/18 0349  Weight: 72.3 kg 71.6 kg 70.9 kg    Examination:   General: Not in pain or dyspnea, deconditioned  Neurology: Awake and alert, non focal  E ENT: mild pallor, no icterus, oral mucosa moist Cardiovascular: No JVD. S1-S2 present, rhythmic, no gallops, rubs, or murmurs. Trace lower extremity edema. Pulmonary: positive breath  sounds bilaterally, decreased air movement, no significant wheezing or rhonchi, scattered rales. Gastrointestinal. Abdomen protuberant no organomegaly, non tender, no rebound or guarding Skin. No rashes Musculoskeletal: no joint deformities     Data Reviewed: I have personally reviewed following labs and imaging studies  CBC: Recent Labs  Lab 06/18/18  1323 06/18/18 1806 06/19/18 0709 06/20/18 0452  WBC  --  3.5* 4.9 5.7  NEUTROABS  --  2.3  --  4.5  HGB 11.5* 11.1* 10.2* 10.4*  HCT  --  37.2 33.6* 34.5*  MCV  --  100.5* 98.5 97.7  PLT  --  123* 130* 284   Basic Metabolic Panel: Recent Labs  Lab 06/18/18 1415 06/18/18 1806 06/18/18 1820 06/19/18 0709 06/20/18 0452  NA 143 144  --  144 144  K 3.2* 4.3  --  3.3* 3.4*  CL 108 109  --  108 108  CO2 26 24  --  24 26  GLUCOSE 145* 104*  --  89 94  BUN 64* 64*  --  60* 54*  CREATININE 3.25* 3.30*  --  3.17* 3.04*  CALCIUM 8.5*  8.5* 8.6*  --  8.6* 9.1  MG  --   --  2.2  --   --   PHOS 3.2  --   --   --  2.7   GFR: Estimated Creatinine Clearance: 13.3 mL/min (A) (by C-G formula based on SCr of 3.04 mg/dL (H)). Liver Function Tests: Recent Labs  Lab 06/18/18 1415 06/19/18 0709 06/20/18 0452  AST  --  24  --   ALT  --  17  --   ALKPHOS  --  53  --   BILITOT  --  0.5  --   PROT  --  5.4*  --   ALBUMIN 3.3* 2.9* 3.0*   No results for input(s): LIPASE, AMYLASE in the last 168 hours. No results for input(s): AMMONIA in the last 168 hours. Coagulation Profile: No results for input(s): INR, PROTIME in the last 168 hours. Cardiac Enzymes: Recent Labs  Lab 06/19/18 0104 06/19/18 0709 06/19/18 1234  TROPONINI 0.09* 0.10* 0.09*   BNP (last 3 results) No results for input(s): PROBNP in the last 8760 hours. HbA1C: Recent Labs    06/20/18 0452  HGBA1C 5.9*   CBG: No results for input(s): GLUCAP in the last 168 hours. Lipid Profile: No results for input(s): CHOL, HDL, LDLCALC, TRIG, CHOLHDL, LDLDIRECT in the last 72 hours. Thyroid Function Tests: Recent Labs    06/19/18 0104  TSH 2.250   Anemia Panel: Recent Labs    06/18/18 1415  FERRITIN 323*  TIBC 267  IRON 51      Radiology Studies: I have reviewed all of the imaging during this hospital visit personally     Scheduled Meds: . allopurinol  100 mg Oral BID  . aspirin EC  81 mg Oral Daily  .  budesonide (PULMICORT) nebulizer solution  0.5 mg Nebulization BID  . feeding supplement  1 Container Oral BID BM  . feeding supplement (PRO-STAT SUGAR FREE 64)  30 mL Oral BID  . fluticasone  2 spray Each Nare Daily  . furosemide  80 mg Intravenous Q12H  . heparin  5,000 Units Subcutaneous Q8H  . hydrALAZINE  25 mg Oral BID  . latanoprost  1 drop Both Eyes QHS  . loratadine  10 mg Oral Daily  . oseltamivir  30 mg Oral QHS   Continuous Infusions:   LOS: 2 days  [place]ia [place] Gerome Apley, MD

## 2018-06-20 NOTE — Progress Notes (Signed)
Physical Therapy Evaluation Patient Details Name: Doris Howell MRN: [id number] DOB: 14-Oct-1935 Today's Date: 06/20/2018   History of Present Illness  Patient is 83 y/o female presenting to hospital with SOB, chest tightness, and non productive cough. Patient positive for flu. PMH includes dCHF, DMII, HTN, sleep apnea, gout, and heart cath.   Clinical Impression  Patient admitted to hospital secondary to problems above and with deficits below. Patient required minA to stand and min guard to transfer with RW. Functional mobility limited secondary to fatigue. Oxygen saturations >90% on RA during functional mobility. Given functional mobility deficits and high fall risk, recommending SNF level therapy following d/c. Patient will benefit from acute physical therapy to maximize independence and safety with functional mobility.     Follow Up Recommendations SNF;Supervision/Assistance - 24 hour    Equipment Recommendations  Other (comment)(TBD at next venue)    Recommendations for Other Services       Precautions / Restrictions Precautions Precautions: Fall Precaution Comments: reports 4 falls in last month Restrictions Weight Bearing Restrictions: No      Mobility  Bed Mobility Overal bed mobility: Needs Assistance Bed Mobility: Supine to Sit     Supine to sit: Min assist     General bed mobility comments: Patient required minA for trunk control to sit EOB. Patient required use of hand rails and increased time and effort.   Transfers Overall transfer level: Needs assistance Equipment used: Rolling walker (2 wheeled) Transfers: Sit to/from [facility] Sit to Stand: Min assist Stand pivot transfers: Min guard       General transfer comment: Patient required minA for lift assist to stand with RW. Verbal cues for hand placement prior to standing when using RW. Patient required min guard during stand pivot transfer for safety. Patient with slow movement  during transfer. Functional mobility limited secondary to fatigue.   Ambulation/Gait                Stairs            Wheelchair Mobility    Modified Rankin (Stroke Patients Only)       Balance Overall balance assessment: Needs assistance Sitting-balance support: No upper extremity supported;Feet supported Sitting balance-Leahy Scale: Good     Standing balance support: Bilateral upper extremity supported Standing balance-Leahy Scale: Poor Standing balance comment: reliant on BUE support to maintain standing balance                             Pertinent Vitals/Pain Pain Assessment: No/denies pain    Home Living Family/patient expects to be discharged to:: Private residence Living Arrangements: Spouse/significant other Available Help at Discharge: Friend(s);Available PRN/intermittently Type of Home: House Home Access: Stairs to enter Entrance Stairs-Rails: None Entrance Stairs-Number of Steps: 1 threshold Home Layout: One level Home Equipment: Walker - 2 wheels;Walker - 4 wheels;Cane - single point Additional Comments: Patient reports husband cannot provide necessary assistance at home.    Prior Function Level of Independence: Independent with assistive device(s)         Comments: reports using a cane or RW for mobility      Hand Dominance        Extremity/Trunk Assessment   Upper Extremity Assessment Upper Extremity Assessment: Overall WFL for tasks assessed    Lower Extremity Assessment Lower Extremity Assessment: Generalized weakness    Cervical / Trunk Assessment Cervical / Trunk Assessment: Normal  Communication   Communication: No difficulties  Cognition  Arousal/Alertness: Awake/alert Behavior During Therapy: WFL for tasks assessed/performed Overall Cognitive Status: Within Functional Limits for tasks assessed                                        General Comments General comments (skin integrity,  edema, etc.): Oxygen saturations >90% on RA throughout functional mobility.     Exercises     Assessment/Plan    PT Assessment Patient needs continued PT services  PT Problem List Decreased strength;Decreased activity tolerance;Decreased balance;Decreased mobility;Decreased knowledge of use of DME       PT Treatment Interventions DME instruction;Gait training;Stair training;Therapeutic activities;Functional mobility training;Therapeutic exercise;Balance training;Patient/family education    PT Goals (Current goals can be found in the Care Plan section)  Acute Rehab PT Goals Patient Stated Goal: go home PT Goal Formulation: With patient Time For Goal Achievement: 07/04/18 Potential to Achieve Goals: Good    Frequency Min 2X/week   Barriers to discharge        Co-evaluation               AM-PAC PT "6 Clicks" Mobility  Outcome Measure Help needed turning from your back to your side while in a flat bed without using bedrails?: A Little Help needed moving from lying on your back to sitting on the side of a flat bed without using bedrails?: A Little Help needed moving to and from a bed to a chair (including a wheelchair)?: A Little Help needed standing up from a chair using your arms (e.g., wheelchair or bedside chair)?: A Little Help needed to walk in hospital room?: A Little Help needed climbing 3-5 steps with a railing? : A Lot 6 Click Score: 17    End of Session Equipment Utilized During Treatment: Gait belt Activity Tolerance: Patient limited by fatigue Patient left: in chair;with call bell/phone within reach;with chair alarm set Nurse Communication: Mobility status PT Visit Diagnosis: Unsteadiness on feet (R26.81);Other abnormalities of gait and mobility (R26.89);Muscle weakness (generalized) (M62.81);History of falling (Z91.81)    Time: 6812-7517 PT Time Calculation (min) (ACUTE ONLY): 19 min   Charges:   PT Evaluation $PT Eval Moderate Complexity: 1 Mod           Erick Blinks, SPT  Erick Blinks 06/20/2018, 2:18 PM

## 2018-06-20 NOTE — Progress Notes (Signed)
Patient refusing nutritional supplements.

## 2018-06-21 LAB — BASIC METABOLIC PANEL
Anion gap: 10 (ref 5–15)
BUN: 51 mg/dL — ABNORMAL HIGH (ref 8–23)
CO2: 27 mmol/L (ref 22–32)
Calcium: 8.9 mg/dL (ref 8.9–10.3)
Chloride: 107 mmol/L (ref 98–111)
Creatinine, Ser: 3.1 mg/dL — ABNORMAL HIGH (ref 0.44–1.00)
GFR calc Af Amer: 16 mL/min — ABNORMAL LOW (ref >60)
GFR calc non Af Amer: 14 mL/min — ABNORMAL LOW (ref >60)
Glucose, Bld: 105 mg/dL — ABNORMAL HIGH (ref 70–99)
Potassium: 3.5 mmol/L (ref 3.5–5.1)
Sodium: 144 mmol/L (ref 135–145)

## 2018-06-21 MED ORDER — TORSEMIDE 20 MG PO TABS
20.0000 mg | ORAL_TABLET | Freq: Every day | ORAL | Status: DC
  Administered 2018-06-22: 20 mg via ORAL
  Filled 2018-06-21: qty 1

## 2018-06-21 NOTE — Progress Notes (Signed)
PROGRESS NOTE    Doris Howell  TTS:[id number] DOB: 1935/11/11 DOA: 06/18/2018 PCP: Lavone Orn, MD    Brief Narrative:  83 year old female who presented with dyspnea, she does have significant past medical history for heart failure, chronic kidney disease stage IV who was found bradycardic, and was referred to the emergency room.  For the last 4 days she has been experiencing dyspnea with nonproductive cough and chest tightness.  She will physical examination blood pressure 142/71, heart rate 50, respiratory rate 23, temperature 98.3, oxygen saturation 95% her lungs had bilateral expiratory wheezing, no rales, heart S1-S2 present with me, abdomen soft nontender, no lower extremity edema.  Influenza A positive. Chest x-ray had cardiomegaly with vascular congestion, bilateral interstitial infiltrates.  EKG sinus rhythm, 69 bpm, normal axis, left bundle branch block.   Patient was admitted to the hospital working diagnosis of acute decompensated heart failure.    Assessment & Plan:   Principal Problem:   Acute respiratory failure with hypoxia (HCC) Active Problems:   Diastolic CHF, acute on chronic (HCC)   Type 2 diabetes mellitus with stage 4 chronic kidney disease (HCC)   Essential hypertension   Gout   Sleep apnea   Pulmonary hypertension (HCC)   Influenza A   1. Acute hypoxic respiratory failure due to decompensated diastolic heart failure. Urine output over last 24 H,1,650 ml. Improved volume status, will resume oral furosemide in am. For now will continue with  after load reduction with hydralazine.   2. Acute influenza A. Patient is very weak and deconditioned, will need SNF per physical therapy recommendations. On Oseltamivir.   3. T2DM. Glucose cover and monitoring, with insulin sliding scale. Patient with poor oral intake. Fasting glucose today 105 mg/dl.   4. HTN. Blood pressure control with hydralazine.   DVT prophylaxis: enoxaparin   Code Status: full  Family Communication: no family at the bedside  Disposition Plan/ discharge barriers: pending SNF placement.   Body mass index is 28.3 kg/m. Malnutrition Type:  Nutrition Problem: Inadequate oral intake Etiology: decreased appetite   Malnutrition Characteristics:  Signs/Symptoms: meal completion < 25%   Nutrition Interventions:  Interventions: Boost Breeze, MVI, Prostat  RN Pressure Injury Documentation:     Consultants:     Procedures:     Antimicrobials:       Subjective: This am patient is very somnolent and weak, no nausea or vomiting, but persistent cough and dyspnea. Very poor mobility.   Objective: Vitals:   06/21/18 0650 06/21/18 0700 06/21/18 0840 06/21/18 0920  BP:   (!) 137/45 (!) 144/57  Pulse:  78 68 72  Resp:   18 16  Temp:   98.1 F (36.7 C)   TempSrc:   Oral   SpO2:  97% 94% 93%  Weight: 67.9 kg     Height:        Intake/Output Summary (Last 24 hours) at 06/21/2018 1149 Last data filed at 06/21/2018 0840 Gross per 24 hour  Intake 660 ml  Output 1630 ml  Net -970 ml   Filed Weights   06/19/18 0527 06/20/18 0349 06/21/18 0650  Weight: 71.6 kg 70.9 kg 67.9 kg    Examination:   General: deconditioned and ill looking appearing Neurology: somnolent, poorly reactive, able to answer questions and follow commands.   E ENT: positive pallor, no icterus, oral mucosa moist Cardiovascular: No JVD. S1-S2 present, rhythmic, no gallops, rubs, or murmurs. Trace lower extremity edema. Pulmonary: positive breath sounds bilaterally, poor air movement, no wheezing, rhonchi or  rales. Gastrointestinal. Abdomen protuberant, no organomegaly, non tender, no rebound or guarding Skin. No rashes Musculoskeletal: no joint deformities     Data Reviewed: I have personally reviewed following labs and imaging studies  CBC: Recent Labs  Lab 06/18/18 1323 06/18/18 1806 06/19/18 0709 06/20/18 0452  WBC  --  3.5* 4.9 5.7  NEUTROABS  --  2.3  --  4.5   HGB 11.5* 11.1* 10.2* 10.4*  HCT  --  37.2 33.6* 34.5*  MCV  --  100.5* 98.5 97.7  PLT  --  123* 130* 161   Basic Metabolic Panel: Recent Labs  Lab 06/18/18 1415 06/18/18 1806 06/18/18 1820 06/19/18 0709 06/20/18 0452 06/21/18 0621  NA 143 144  --  144 144 144  K 3.2* 4.3  --  3.3* 3.4* 3.5  CL 108 109  --  108 108 107  CO2 26 24  --  24 26 27   GLUCOSE 145* 104*  --  89 94 105*  BUN 64* 64*  --  60* 54* 51*  CREATININE 3.25* 3.30*  --  3.17* 3.04* 3.10*  CALCIUM 8.5*  8.5* 8.6*  --  8.6* 9.1 8.9  MG  --   --  2.2  --   --   --   PHOS 3.2  --   --   --  2.7  --    GFR: Estimated Creatinine Clearance: 12.8 mL/min (A) (by C-G formula based on SCr of 3.1 mg/dL (H)). Liver Function Tests: Recent Labs  Lab 06/18/18 1415 06/19/18 0709 06/20/18 0452  AST  --  24  --   ALT  --  17  --   ALKPHOS  --  53  --   BILITOT  --  0.5  --   PROT  --  5.4*  --   ALBUMIN 3.3* 2.9* 3.0*   No results for input(s): LIPASE, AMYLASE in the last 168 hours. No results for input(s): AMMONIA in the last 168 hours. Coagulation Profile: No results for input(s): INR, PROTIME in the last 168 hours. Cardiac Enzymes: Recent Labs  Lab 06/19/18 0104 06/19/18 0709 06/19/18 1234  TROPONINI 0.09* 0.10* 0.09*   BNP (last 3 results) No results for input(s): PROBNP in the last 8760 hours. HbA1C: Recent Labs    06/20/18 0452  HGBA1C 5.9*   CBG: No results for input(s): GLUCAP in the last 168 hours. Lipid Profile: No results for input(s): CHOL, HDL, LDLCALC, TRIG, CHOLHDL, LDLDIRECT in the last 72 hours. Thyroid Function Tests: Recent Labs    06/19/18 0104  TSH 2.250   Anemia Panel: Recent Labs    06/18/18 1415  FERRITIN 323*  TIBC 267  IRON 51      Radiology Studies: I have reviewed all of the imaging during this hospital visit personally     Scheduled Meds: . allopurinol  100 mg Oral BID  . aspirin EC  81 mg Oral Daily  . budesonide (PULMICORT) nebulizer solution  0.5  mg Nebulization BID  . feeding supplement  1 Container Oral BID BM  . feeding supplement (PRO-STAT SUGAR FREE 64)  30 mL Oral BID  . fluticasone  2 spray Each Nare Daily  . furosemide  80 mg Intravenous Q12H  . heparin  5,000 Units Subcutaneous Q8H  . hydrALAZINE  25 mg Oral BID  . latanoprost  1 drop Both Eyes QHS  . loratadine  10 mg Oral Daily  . oseltamivir  30 mg Oral QHS   Continuous Infusions:   LOS:  3 days         Gerome Apley, MD

## 2018-06-21 NOTE — TOC Initial Note (Signed)
Transition of Care [facility]) - Initial/Assessment Note    Patient Details  Name: HERAN CAMPAU MRN: [id number] Date of Birth: 1935/11/30  Transition of Care [facility]) CM/SW Contact:    Candie Chroman, LCSW Phone Number: [phone] 06/21/2018, 3:07 PM  Clinical Narrative: CSW met with patient. No supports at bedside. CSW introduced role and explained that PT recommendations would be discussed. Patient agreeable to SNF placement. First preference facility is [facility]. Her husband has been there in the past and she has visited various people there. [place] is able to offer a bed. Per CSW handoff last day of Tamiflu is Saturday so earliest they can take her is tomorrow. Sent MD a message to see if there is an anticipated discharge date. No further concerns. CSW encouraged patient to contact CSW as needed. CSW will continue to follow patient for support and facilitate discharge to SNF once medically stable.  Expected Discharge Plan: Skilled Nursing Facility Barriers to Discharge: Insurance Authorization, Continued Medical Work up   Patient Goals and CMS Choice Patient states their goals for this hospitalization and ongoing recovery are:: "To be able to do things on my own without getting so tired." CMS Medicare.gov Compare Post Acute Care list provided to:: Patient    Expected Discharge Plan and Services Expected Discharge Plan: [place]     Living arrangements for the past 2 months: Single Family Home                          Prior Living Arrangements/Services Living arrangements for the past 2 months: Single Family Home Lives with:: Spouse Patient language and need for interpreter reviewed:: No Do you feel safe going back to the place where you live?: Yes      Need for Family Participation in Patient Care: Yes (Comment)     Criminal Activity/Legal Involvement Pertinent to Current Situation/Hospitalization: No - Comment as needed  Activities of Daily  Living Home Assistive Devices/Equipment: Cane (specify quad or straight), Walker (specify type) ADL Screening (condition at time of admission) Patient's cognitive ability adequate to safely complete daily activities?: Yes Is the patient deaf or have difficulty hearing?: No Does the patient have difficulty seeing, even when wearing glasses/contacts?: No Does the patient have difficulty concentrating, remembering, or making decisions?: No Patient able to express need for assistance with ADLs?: Yes Does the patient have difficulty dressing or bathing?: No Independently performs ADLs?: Yes (appropriate for developmental age) Does the patient have difficulty walking or climbing stairs?: Yes Weakness of Legs: Both Weakness of Arms/Hands: Both  Permission Sought/Granted Permission sought to share information with : Facility Art therapist granted to share information with : Yes, Verbal Permission Granted     Permission granted to share info w AGENCY: [facility]        Emotional Assessment Appearance:: Appears stated age Attitude/Demeanor/Rapport: Engaged, Gracious Affect (typically observed): Accepting, Appropriate, Calm, Pleasant Orientation: : Oriented to Self, Oriented to Place, Oriented to  Time, Oriented to Situation Alcohol / Substance Use: Never Used Psych Involvement: No (comment)  Admission diagnosis:  Irregular Heatbeats Patient Active Problem List   Diagnosis Date Noted  . Influenza A 06/19/2018  . Acute pulmonary edema ([place]) 06/18/2018  . Pulmonary hypertension ([place]) 10/13/2016  . Left bundle branch block 12/22/2014  . Renal failure, acute on chronic (HCC)   . Pleural effusion on right   . Lung infiltrate   . Acute respiratory failure, unspecified whether with hypoxia or  hypercapnia ([place])   . CHF exacerbation ([place])   . Acute on chronic renal failure ([place])   . Acute exacerbation of CHF (congestive heart failure) ([place]) 08/28/2014  . Acute  diastolic CHF (congestive heart failure) ([place]) 08/28/2014  . Chronic kidney disease (CKD), stage IV (severe) ([place])   . Acute on chronic combined systolic and diastolic CHF (congestive heart failure) ([place])   . Sleep apnea 07/29/2014  . Obesity (BMI 37) 04/14/2014  . Bradycardia- beta blocker decreased 04/14/2014  . Pulmonary infiltrates   . Diastolic CHF, acute on chronic (HCC) 04/11/2014  . Type 2 diabetes mellitus with stage 4 chronic kidney disease ([place]) 04/11/2014  . Essential hypertension 04/11/2014  . Gout 04/11/2014  . HCAP (healthcare-associated pneumonia)   . Hyperkalemia 02/08/2014  . Chronic diastolic heart failure ([place]) 02/05/2014  . CKD (chronic kidney disease), stage IV ([place]) 02/05/2014  . CAP (community acquired pneumonia) 01/09/2014  . Acute respiratory failure with hypoxia ([place]) 01/09/2014  . Anemia 01/09/2014  . AKI (acute kidney injury) ([place]) 01/09/2014   PCP:  Lavone Orn, MD Pharmacy:   [id number] [place] [id number]: [phone]: [phone]    Social Determinants of Health (SDOH) Interventions    Readmission Risk Interventions 30 Day Unplanned Readmission Risk Score     [place]  30 Day Unplanned Readmission Risk Score (%)  21 Filed at 06/21/2018 1200     This score is the patient's risk of an unplanned readmission within 30 days of being discharged (0 -100%). The score is based on dignosis, age, lab data, medications, orders, and past utilization.   Low:  0-14.9   Medium: 15-21.9   High: 22-29.9   Extreme: 30 and above       No flowsheet data found.

## 2018-06-21 NOTE — NC FL2 (Signed)
MEDICAID FL2 LEVEL OF CARE SCREENING TOOL     IDENTIFICATION  Patient Name: Doris Howell Birthdate: 03/31/1936 Sex: female Admission Date (Current Location): 06/18/2018  [facility] and [place] Number:  Herbalist and Address:  [facility] [place], [place] [address], [place], [place] [address]      Provider Number: [id number]  Attending Physician Name and Address:  Tawni Millers  Relative Name and Phone Number:       Current Level of Care: Hospital Recommended Level of Care: [place] Prior Approval Number:    Date Approved/Denied:   PASRR Number: [id number]  Discharge Plan: SNF    Current Diagnoses: Patient Active Problem List   Diagnosis Date Noted  . Influenza A 06/19/2018  . Acute pulmonary edema ([place]) 06/18/2018  . Pulmonary hypertension ([place]) 10/13/2016  . Left bundle branch block 12/22/2014  . Renal failure, acute on chronic (HCC)   . Pleural effusion on right   . Lung infiltrate   . Acute respiratory failure, unspecified whether with hypoxia or hypercapnia ([place])   . CHF exacerbation ([place])   . Acute on chronic renal failure ([place])   . Acute exacerbation of CHF (congestive heart failure) ([place]) 08/28/2014  . Acute diastolic CHF (congestive heart failure) ([place]) 08/28/2014  . Chronic kidney disease (CKD), stage IV (severe) ([place])   . Acute on chronic combined systolic and diastolic CHF (congestive heart failure) ([place])   . Sleep apnea 07/29/2014  . Obesity (BMI 37) 04/14/2014  . Bradycardia- beta blocker decreased 04/14/2014  . Pulmonary infiltrates   . Diastolic CHF, acute on chronic (HCC) 04/11/2014  . Type 2 diabetes mellitus with stage 4 chronic kidney disease ([place]) 04/11/2014  . Essential hypertension 04/11/2014  . Gout 04/11/2014  . HCAP (healthcare-associated pneumonia)   . Hyperkalemia 02/08/2014  . Chronic diastolic heart failure ([place]) 02/05/2014  . CKD (chronic kidney disease), stage IV  ([place]) 02/05/2014  . CAP (community acquired pneumonia) 01/09/2014  . Acute respiratory failure with hypoxia ([place]) 01/09/2014  . Anemia 01/09/2014  . AKI (acute kidney injury) ([place]) 01/09/2014    Orientation RESPIRATION BLADDER Height & Weight     Self, Time, Situation, Place  Normal, Other (Comment)(Has an order for cpap but she refuses it. Doesn't use at home.) Continent Weight: 149 lb 12.8 oz (67.9 kg) Height:  5\' 1"  (154.9 cm)  BEHAVIORAL SYMPTOMS/MOOD NEUROLOGICAL BOWEL NUTRITION STATUS  (None) (None) Continent Diet(2 gram sodium. Fluid restriction 1200 mL.)  AMBULATORY STATUS COMMUNICATION OF NEEDS Skin   Extensive Assist Verbally Normal                       Personal Care Assistance Level of Assistance              Functional Limitations Info  Sight, Hearing, Speech Sight Info: Adequate Hearing Info: Adequate Speech Info: Adequate    SPECIAL CARE FACTORS FREQUENCY  PT (By licensed PT)     PT Frequency: 5 x week              Contractures Contractures Info: Not present    Additional Factors Info  Code Status, Allergies, Isolation Precautions Code Status Info: Full code Allergies Info: Simvastatin, Codeine, Pholcodeine.     Isolation Precautions Info: Droplet: Flu. Last day of Tamiflu will be 3/14.     Current Medications (06/21/2018):  This is the current hospital active medication list Current Facility-Administered Medications  Medication Dose Route Frequency Provider Last Rate  Last Dose  . acetaminophen (TYLENOL) tablet 650 mg  650 mg Oral Q6H PRN Rise Patience, MD       Or  . acetaminophen (TYLENOL) suppository 650 mg  650 mg Rectal Q6H PRN Rise Patience, MD      . allopurinol (ZYLOPRIM) tablet 100 mg  100 mg Oral BID Rise Patience, MD   100 mg at 06/21/18 1105  . aspirin EC tablet 81 mg  81 mg Oral Daily Rise Patience, MD   81 mg at 06/21/18 1107  . budesonide (PULMICORT) nebulizer solution 0.5 mg  0.5 mg  Nebulization BID Eugenie Filler, MD   0.5 mg at 06/21/18 0900  . feeding supplement (BOOST / RESOURCE BREEZE) liquid 1 Container  1 Container Oral BID BM Eugenie Filler, MD   1 Container at 06/20/18 [phone]  . feeding supplement (PRO-STAT SUGAR FREE 64) liquid 30 mL  30 mL Oral BID Eugenie Filler, MD   30 mL at 06/20/18 2124  . fluticasone (FLONASE) 50 MCG/ACT nasal spray 2 spray  2 spray Each Nare Daily Eugenie Filler, MD   2 spray at 06/21/18 1108  . furosemide (LASIX) injection 80 mg  80 mg Intravenous Q12H Eugenie Filler, MD   80 mg at 06/21/18 1302  . guaiFENesin (ROBITUSSIN) 100 MG/5ML solution 100 mg  5 mL Oral Q4H PRN Arrien, Jimmy Picket, MD   100 mg at 06/21/18 1106  . heparin injection 5,000 Units  5,000 Units Subcutaneous Q8H Rise Patience, MD   5,000 Units at 06/21/18 1301  . hydrALAZINE (APRESOLINE) tablet 25 mg  25 mg Oral BID Rise Patience, MD   25 mg at 06/21/18 1105  . latanoprost (XALATAN) 0.005 % ophthalmic solution 1 drop  1 drop Both Eyes QHS Rise Patience, MD   1 drop at 06/20/18 2122  . levalbuterol (XOPENEX) nebulizer solution 0.63 mg  0.63 mg Nebulization Q6H PRN Rise Patience, MD   0.63 mg at 06/20/18 0128  . loratadine (CLARITIN) tablet 10 mg  10 mg Oral Daily Eugenie Filler, MD   10 mg at 06/21/18 1104  . nitroGLYCERIN (NITROSTAT) SL tablet 0.4 mg  0.4 mg Sublingual Q5 min PRN Rise Patience, MD      . ondansetron [facility]) tablet 4 mg  4 mg Oral Q6H PRN Rise Patience, MD       Or  . ondansetron [facility]) injection 4 mg  4 mg Intravenous Q6H PRN Rise Patience, MD      . oseltamivir (TAMIFLU) capsule 30 mg  30 mg Oral QHS Rise Patience, MD   30 mg at 06/20/18 2121     Discharge Medications: Please see discharge summary for a list of discharge medications.  Relevant Imaging Results:  Relevant Lab Results:   Additional Information SS#: [id number]  Candie Chroman, LCSW

## 2018-06-21 NOTE — Progress Notes (Signed)
Patient refused CPAP for tonight.  Noted that this is 3rd night refusal,  Machine pulled from room and order d/c'd.

## 2018-06-22 LAB — BASIC METABOLIC PANEL
Anion gap: 12 (ref 5–15)
BUN: 52 mg/dL — ABNORMAL HIGH (ref 8–23)
CO2: 27 mmol/L (ref 22–32)
Calcium: 9 mg/dL (ref 8.9–10.3)
Chloride: 104 mmol/L (ref 98–111)
Creatinine, Ser: 3.13 mg/dL — ABNORMAL HIGH (ref 0.44–1.00)
GFR calc Af Amer: 16 mL/min — ABNORMAL LOW (ref >60)
GFR calc non Af Amer: 13 mL/min — ABNORMAL LOW (ref >60)
Glucose, Bld: 79 mg/dL (ref 70–99)
Potassium: 3.4 mmol/L — ABNORMAL LOW (ref 3.5–5.1)
Sodium: 143 mmol/L (ref 135–145)

## 2018-06-22 MED ORDER — GUAIFENESIN-DM 100-10 MG/5ML PO SYRP
5.0000 mL | ORAL_SOLUTION | Freq: Four times a day (QID) | ORAL | Status: DC
  Administered 2018-06-22 – 2018-06-25 (×11): 5 mL via ORAL
  Filled 2018-06-22 (×12): qty 5

## 2018-06-22 NOTE — Progress Notes (Signed)
PROGRESS NOTE    Doris Howell  KGU:[id number] DOB: October 26, 1935 DOA: 06/18/2018 PCP: Lavone Orn, MD    Brief Narrative:  83 year old female who presented with dyspnea, she does have significant past medical history for heart failure, chronic kidney disease stage IV who was found bradycardic,and was referred to the emergency room. For the last 4 days she has been experiencing dyspnea with nonproductive cough and chest tightness. She will physical examination blood pressure 142/71, heart rate 50, respiratory rate 23, temperature 98.3, oxygen saturation 95% her lungs had bilateral expiratory wheezing, no rales, heart S1-S2 present with me, abdomen soft nontender, no lower extremity edema.Influenza A positive. Chest x-ray had cardiomegaly with vascular congestion, bilateral interstitial infiltrates. EKG sinus rhythm, 69 bpm, normal axis, left bundle branch block.  Patient was admitted to the hospital working diagnosis of acute decompensated heart failure.    Assessment & Plan:   Principal Problem:   Acute respiratory failure with hypoxia (HCC) Active Problems:   Diastolic CHF, acute on chronic (HCC)   Type 2 diabetes mellitus with stage 4 chronic kidney disease (HCC)   Essential hypertension   Gout   Sleep apnea   Pulmonary hypertension (HCC)   Influenza A    1. Acute hypoxic respiratory failure due to decompensated diastolic heart failure. Continue diuresis with oral furosemide, her symptoms have improved but continue very weak and deconditioned, has not been out of bed. Continue blood pressure control with hydralazine.   2. Acute influenza A. Patient continue to be very weak and deconditioned, persistent cough that triggers dyspnea, will add scheduled guaifenesin plus dextromethorphan. Continue Oseltamivir. Continue bronchodilator therapy.    3. T2DM. Continue glucose monitoring. Continue to have very poor oral intake. Her fasting glucose was low down to 79 this am.  Will hold on sliding scale insulin for now.   4. HTN. Continue hydralazine for blood pressure control.   5. CKD stage IV. Old records personally reviewed, base cr 3,1 with a calculated GFR of 16. K at 3,4 and serum bicarbonate at 27.   DVT prophylaxis:enoxaparin Code Status:full Family Communication:no family at the bedside Disposition Plan/ discharge barriers:pending SNF placement  Body mass index is 28.41 kg/m. Malnutrition Type:  Nutrition Problem: Inadequate oral intake Etiology: decreased appetite   Malnutrition Characteristics:  Signs/Symptoms: meal completion < 25%   Nutrition Interventions:  Interventions: Boost Breeze, MVI, Prostat  RN Pressure Injury Documentation:     Consultants:     Procedures:     Antimicrobials:   Oseltamivir    Subjective: This am feeling better but not back to baseline, continue to feel very week and deconditioned, persistent cough associated with dyspnea, no nausea or vomiting, but very poor appetite. Agrees in SNF at discharge.   Objective: Vitals:   06/21/18 2103 06/22/18 0600 06/22/18 0637 06/22/18 0916  BP: 134/60  (!) 120/44 139/71  Pulse: 77  61 77  Resp: 20  18 18   Temp: 98.7 F (37.1 C)  (!) 97.4 F (36.3 C) 98.1 F (36.7 C)  TempSrc: Oral  Oral Oral  SpO2: 95%  90% 97%  Weight:  68.2 kg    Height:        Intake/Output Summary (Last 24 hours) at 06/22/2018 1007 Last data filed at 06/22/2018 0900 Gross per 24 hour  Intake 720 ml  Output 800 ml  Net -80 ml   Filed Weights   06/20/18 0349 06/21/18 0650 06/22/18 0600  Weight: 70.9 kg 67.9 kg 68.2 kg    Examination:   General:  deconditioned and ill looking appearing Neurology: Awake and alert, non focal  E ENT: mild pallor, no icterus, oral mucosa moist Cardiovascular: No JVD. S1-S2 present, rhythmic, no gallops, rubs, or murmurs. Trace lower extremity edema. Pulmonary: positive breath sounds bilaterally, mild decreased air movement, du to  poor inspiratory effort, but no frank no wheezing, rhonchi or rales. Gastrointestinal. Abdomen with no organomegaly, non tender, no rebound or guarding Skin. No rashes Musculoskeletal: no joint deformities     Data Reviewed: I have personally reviewed following labs and imaging studies  CBC: Recent Labs  Lab 06/18/18 1323 06/18/18 1806 06/19/18 0709 06/20/18 0452  WBC  --  3.5* 4.9 5.7  NEUTROABS  --  2.3  --  4.5  HGB 11.5* 11.1* 10.2* 10.4*  HCT  --  37.2 33.6* 34.5*  MCV  --  100.5* 98.5 97.7  PLT  --  123* 130* 154   Basic Metabolic Panel: Recent Labs  Lab 06/18/18 1415 06/18/18 1806 06/18/18 1820 06/19/18 0709 06/20/18 0452 06/21/18 0621 06/22/18 0451  NA 143 144  --  144 144 144 143  K 3.2* 4.3  --  3.3* 3.4* 3.5 3.4*  CL 108 109  --  108 108 107 104  CO2 26 24  --  24 26 27 27   GLUCOSE 145* 104*  --  89 94 105* 79  BUN 64* 64*  --  60* 54* 51* 52*  CREATININE 3.25* 3.30*  --  3.17* 3.04* 3.10* 3.13*  CALCIUM 8.5*  8.5* 8.6*  --  8.6* 9.1 8.9 9.0  MG  --   --  2.2  --   --   --   --   PHOS 3.2  --   --   --  2.7  --   --    GFR: Estimated Creatinine Clearance: 12.7 mL/min (A) (by C-G formula based on SCr of 3.13 mg/dL (H)). Liver Function Tests: Recent Labs  Lab 06/18/18 1415 06/19/18 0709 06/20/18 0452  AST  --  24  --   ALT  --  17  --   ALKPHOS  --  53  --   BILITOT  --  0.5  --   PROT  --  5.4*  --   ALBUMIN 3.3* 2.9* 3.0*   No results for input(s): LIPASE, AMYLASE in the last 168 hours. No results for input(s): AMMONIA in the last 168 hours. Coagulation Profile: No results for input(s): INR, PROTIME in the last 168 hours. Cardiac Enzymes: Recent Labs  Lab 06/19/18 0104 06/19/18 0709 06/19/18 1234  TROPONINI 0.09* 0.10* 0.09*   BNP (last 3 results) No results for input(s): PROBNP in the last 8760 hours. HbA1C: Recent Labs    06/20/18 0452  HGBA1C 5.9*   CBG: No results for input(s): GLUCAP in the last 168 hours. Lipid  Profile: No results for input(s): CHOL, HDL, LDLCALC, TRIG, CHOLHDL, LDLDIRECT in the last 72 hours. Thyroid Function Tests: No results for input(s): TSH, T4TOTAL, FREET4, T3FREE, THYROIDAB in the last 72 hours. Anemia Panel: No results for input(s): VITAMINB12, FOLATE, FERRITIN, TIBC, IRON, RETICCTPCT in the last 72 hours.    Radiology Studies: I have reviewed all of the imaging during this hospital visit personally     Scheduled Meds: . allopurinol  100 mg Oral BID  . aspirin EC  81 mg Oral Daily  . budesonide (PULMICORT) nebulizer solution  0.5 mg Nebulization BID  . feeding supplement  1 Container Oral BID BM  . feeding supplement (PRO-STAT SUGAR  FREE 64)  30 mL Oral BID  . fluticasone  2 spray Each Nare Daily  . guaiFENesin-dextromethorphan  5 mL Oral Q6H  . heparin  5,000 Units Subcutaneous Q8H  . hydrALAZINE  25 mg Oral BID  . latanoprost  1 drop Both Eyes QHS  . loratadine  10 mg Oral Daily  . oseltamivir  30 mg Oral QHS   Continuous Infusions:   LOS: 4 days        Mauricio Gerome Apley, MD

## 2018-06-22 NOTE — TOC Progression Note (Addendum)
Transition of Care [facility]) - Progression Note    Patient Details  Name: Doris Howell MRN: [id number] Date of Birth: 06-21-1935  Transition of Care [facility]) CM/SW [place], LCSW Phone Number: 06/22/2018, 1:21 PM  Clinical Narrative:   Insurance authorization for SNF denied. MD willing to do a peer-to-peer. CSW left voicemail for [facility] RN to provide MD's name and asked her to call back with phone number for him to call.  4:18 pm: MD has completed peer-to-peer review. Authorization still pending.  Expected Discharge Plan: [place] Barriers to Discharge: Ship broker, Continued Medical Work up  Expected Discharge Plan and Services Expected Discharge Plan: [place]     Living arrangements for the past 2 months: Single Family Home                           Social Determinants of Health (SDOH) Interventions    Readmission Risk Interventions 30 Day Unplanned Readmission Risk Score     ED to Hosp-Admission (Current) from 06/18/2018 in [place] CHF  30 Day Unplanned Readmission Risk Score (%)  21 Filed at 06/22/2018 1200     This score is the patient's risk of an unplanned readmission within 30 days of being discharged (0 -100%). The score is based on dignosis, age, lab data, medications, orders, and past utilization.   Low:  0-14.9   Medium: 15-21.9   High: 22-29.9   Extreme: 30 and above       No flowsheet data found.

## 2018-06-22 NOTE — Progress Notes (Signed)
Nutrition Follow-up  DOCUMENTATION CODES:   Not applicable  INTERVENTION:   -D/c Prostat BID -D/c Boost Breeze TID -Continue MVI daily -Magic cup TID with meals, each supplement provides 290 kcal and 9 grams of protein -Liberalize diet to regular (received verbal permission from Dr. Cathlean Sauer)  NUTRITION DIAGNOSIS:   Inadequate oral intake related to decreased appetite as evidenced by meal completion < 25%.  Ongoing  GOAL:   Patient will meet greater than or equal to 90% of their needs  Unmet  MONITOR:   PO intake, Supplement acceptance, Labs, Weight trends, Skin, I & O's  REASON FOR ASSESSMENT:   Consult Assessment of nutrition requirement/status  ASSESSMENT:   Doris Howell is a 83 y.o. female with history of CHF, chronic kidney disease stage IV, gout had gone for her IV iron infusion when patient was found to be bradycardic and was referred to the ER.  Patient states over the last 4 days patient has been short of breath with nonproductive cough and chest tightness.  Patient's family member also was having respiratory illness.  Reviewed I/O's: -350 ml x 24 hours and -2.6 L since admission  RD re-consulted due to poor oral intake. Case discussed extensively with RN, who reports that pt is refusing most care. She has been taking medications, however, has been refusing both Boost Breeze and Prostat (pt also refused offer of Ensure and Nepro at last visit, but will not drink milky-type supplements). RN reports pt is consuming bites of sips at meals- 25% of trays at best. Per nutrition services ambassador, pt also refused lunch tray today.   Spoke with pt at bedside, who reports continued poor appetite ("I just don't have a taste for anything"). She ate a few bites of banana and a bite of angel food cake, "but that didn't taste right either". She is refusing Boost Breeze supplement, as it is too sweet, and reports Prostat makes her stomach hurt. Pt feels like she has a few  more options on low sodium diet, but currently only feels like eating a grilled cheese and tomato soup. She is also amenable to [employer]. Discussed case with Dr. Cathlean Sauer, who gave RD permission to liberalize diet to regular to promote improved oral intake via secure chat.   Labs reviewed: K: 3.4.   Diet Order:   Diet Order            Diet regular Room service appropriate? Yes; Fluid consistency: Thin  Diet effective now              EDUCATION NEEDS:   Education needs have been addressed  Skin:  Skin Assessment: Reviewed RN Assessment  Last BM:  06/20/18  Height:   Ht Readings from Last 1 Encounters:  06/18/18 5\' 1"  (1.549 m)    Weight:   Wt Readings from Last 1 Encounters:  06/22/18 68.2 kg    Ideal Body Weight:  47.7 kg  BMI:  Body mass index is 28.41 kg/m.  Estimated Nutritional Needs:   Kcal:  1700-1900  Protein:  85-100 grams  Fluid:  1.2 L    Landa Mullinax A. Jimmye Norman, RD, LDN, [place] Registered Dietitian II Certified Diabetes Care and Education Specialist Pager: [phone] After hours Pager: [phone]

## 2018-06-23 LAB — BASIC METABOLIC PANEL
Anion gap: 12 (ref 5–15)
BUN: 52 mg/dL — ABNORMAL HIGH (ref 8–23)
CO2: 26 mmol/L (ref 22–32)
Calcium: 9.1 mg/dL (ref 8.9–10.3)
Chloride: 105 mmol/L (ref 98–111)
Creatinine, Ser: 3.07 mg/dL — ABNORMAL HIGH (ref 0.44–1.00)
GFR calc Af Amer: 16 mL/min — ABNORMAL LOW (ref >60)
GFR calc non Af Amer: 14 mL/min — ABNORMAL LOW (ref >60)
Glucose, Bld: 86 mg/dL (ref 70–99)
Potassium: 3.7 mmol/L (ref 3.5–5.1)
Sodium: 143 mmol/L (ref 135–145)

## 2018-06-23 LAB — CULTURE, BLOOD (ROUTINE X 2)
Culture: NO GROWTH
Culture: NO GROWTH
Special Requests: ADEQUATE
Special Requests: ADEQUATE

## 2018-06-23 NOTE — Progress Notes (Signed)
PROGRESS NOTE    Doris Howell  NOB:[id number] DOB: 12-16-1935 DOA: 06/18/2018 PCP: Lavone Orn, MD    Brief Narrative:  83 year old female who presented with dyspnea, she does have significant past medical history for heart failure, chronic kidney disease stage IV who was found bradycardic,and was referred to the emergency room. For the last 4 days she has been experiencing dyspnea with nonproductive cough and chest tightness. She will physical examination blood pressure 142/71, heart rate 50, respiratory rate 23, temperature 98.3, oxygen saturation 95% her lungs had bilateral expiratory wheezing, no rales, heart S1-S2 present with me, abdomen soft nontender, no lower extremity edema.Influenza A positive. Chest x-ray had cardiomegaly with vascular congestion, bilateral interstitial infiltrates. EKG sinus rhythm, 69 bpm, normal axis, left bundle branch block.  Patient was admitted to the hospital working diagnosis of acute decompensated heart failure.   Assessment & Plan:   Principal Problem:   Acute respiratory failure with hypoxia (HCC) Active Problems:   Diastolic CHF, acute on chronic (HCC)   Type 2 diabetes mellitus with stage 4 chronic kidney disease (HCC)   Essential hypertension   Gout   Sleep apnea   Pulmonary hypertension (HCC)   Influenza A  1. Acute hypoxic respiratory failure due to decompensated diastolic heart failure. Tolerating well furosemide. Patient  continue to be very weak and deconditioned. Not able to ambulate due to fatigue. Continue hydralazine for after load reduction.   2. Acute influenza A. Completed  Oseltamivir, patient is very weak and deconditioned, fatigue and not able to ambulate, poor oral intake. On as needed bronchodilator therapy.    3. T2DM.Glucose monitoring. Patient will be allowed to bring food from home, so far very poor oral intake.   4. HTN.On hydralazine.   5. CKD stage IV. Stable renal function with serum cr at  3,0, with K at 3,7, will follow on renal panel in 48 H.   6. Protein calorie malnutrition. Continue nutritional supplements.    DVT prophylaxis:enoxaparin Code Status:full Family Communication:no family at the bedside Disposition Plan/ discharge barriers:pending SNF placement   Body mass index is 28.78 kg/m. Malnutrition Type:  Nutrition Problem: Inadequate oral intake Etiology: decreased appetite   Malnutrition Characteristics:  Signs/Symptoms: meal completion < 25%   Nutrition Interventions:  Interventions: Liberalize Diet, MVI, Magic cup  RN Pressure Injury Documentation:     Consultants:     Procedures:     Antimicrobials:       Subjective: Dyspnea and lower extremity edema, have improved, no nausea or vomiting, but significant weakness and poor oral intake. Very fatigued with exertion.   Objective: Vitals:   06/22/18 2000 06/22/18 2116 06/23/18 0612 06/23/18 0743  BP:  (!) 156/61 (!) 152/60 (!) 149/90  Pulse:  (!) 56 76 70  Resp:  18 18 18   Temp:  98.6 F (37 C) 98.3 F (36.8 C)   TempSrc:  Oral Oral   SpO2: 95% 95% 92% 98%  Weight:   69.1 kg   Height:        Intake/Output Summary (Last 24 hours) at 06/23/2018 1154 Last data filed at 06/23/2018 0700 Gross per 24 hour  Intake 720 ml  Output 1250 ml  Net -530 ml   Filed Weights   06/21/18 0650 06/22/18 0600 06/23/18 0612  Weight: 67.9 kg 68.2 kg 69.1 kg    Examination:   General: deconditioned  Neurology: Awake and alert, non focal  E ENT: mild pallor, no icterus, oral mucosa moist Cardiovascular: No JVD. S1-S2 present, rhythmic, no  gallops, rubs, or murmurs. No lower extremity edema. Pulmonary: positive breath sounds bilaterally, adequate air movement, no wheezing, rhonchi or rales. Gastrointestinal. Abdomen with no organomegaly, non tender, no rebound or guarding Skin. No rashes Musculoskeletal: no joint deformities     Data Reviewed: I have personally reviewed  following labs and imaging studies  CBC: Recent Labs  Lab 06/18/18 1323 06/18/18 1806 06/19/18 0709 06/20/18 0452  WBC  --  3.5* 4.9 5.7  NEUTROABS  --  2.3  --  4.5  HGB 11.5* 11.1* 10.2* 10.4*  HCT  --  37.2 33.6* 34.5*  MCV  --  100.5* 98.5 97.7  PLT  --  123* 130* 701   Basic Metabolic Panel: Recent Labs  Lab 06/18/18 1415  06/18/18 1820 06/19/18 0709 06/20/18 0452 06/21/18 0621 06/22/18 0451 06/23/18 0408  NA 143   < >  --  144 144 144 143 143  K 3.2*   < >  --  3.3* 3.4* 3.5 3.4* 3.7  CL 108   < >  --  108 108 107 104 105  CO2 26   < >  --  24 26 27 27 26   GLUCOSE 145*   < >  --  89 94 105* 79 86  BUN 64*   < >  --  60* 54* 51* 52* 52*  CREATININE 3.25*   < >  --  3.17* 3.04* 3.10* 3.13* 3.07*  CALCIUM 8.5*  8.5*   < >  --  8.6* 9.1 8.9 9.0 9.1  MG  --   --  2.2  --   --   --   --   --   PHOS 3.2  --   --   --  2.7  --   --   --    < > = values in this interval not displayed.   GFR: Estimated Creatinine Clearance: 13 mL/min (A) (by C-G formula based on SCr of 3.07 mg/dL (H)). Liver Function Tests: Recent Labs  Lab 06/18/18 1415 06/19/18 0709 06/20/18 0452  AST  --  24  --   ALT  --  17  --   ALKPHOS  --  53  --   BILITOT  --  0.5  --   PROT  --  5.4*  --   ALBUMIN 3.3* 2.9* 3.0*   No results for input(s): LIPASE, AMYLASE in the last 168 hours. No results for input(s): AMMONIA in the last 168 hours. Coagulation Profile: No results for input(s): INR, PROTIME in the last 168 hours. Cardiac Enzymes: Recent Labs  Lab 06/19/18 0104 06/19/18 0709 06/19/18 1234  TROPONINI 0.09* 0.10* 0.09*   BNP (last 3 results) No results for input(s): PROBNP in the last 8760 hours. HbA1C: No results for input(s): HGBA1C in the last 72 hours. CBG: No results for input(s): GLUCAP in the last 168 hours. Lipid Profile: No results for input(s): CHOL, HDL, LDLCALC, TRIG, CHOLHDL, LDLDIRECT in the last 72 hours. Thyroid Function Tests: No results for input(s): TSH,  T4TOTAL, FREET4, T3FREE, THYROIDAB in the last 72 hours. Anemia Panel: No results for input(s): VITAMINB12, FOLATE, FERRITIN, TIBC, IRON, RETICCTPCT in the last 72 hours.    Radiology Studies: I have reviewed all of the imaging during this hospital visit personally     Scheduled Meds: . allopurinol  100 mg Oral BID  . aspirin EC  81 mg Oral Daily  . budesonide (PULMICORT) nebulizer solution  0.5 mg Nebulization BID  . fluticasone  2  spray Each Nare Daily  . guaiFENesin-dextromethorphan  5 mL Oral Q6H  . heparin  5,000 Units Subcutaneous Q8H  . hydrALAZINE  25 mg Oral BID  . latanoprost  1 drop Both Eyes QHS  . loratadine  10 mg Oral Daily   Continuous Infusions:   LOS: 5 days        Doris Howell Gerome Apley, MD

## 2018-06-23 NOTE — Progress Notes (Signed)
Patient refused to eat breakfast and Lunch ate one bite of bagel and apple sauce.

## 2018-06-24 NOTE — Progress Notes (Signed)
Pt encouraged to participate in care, get up out of bed. Pt refused stating she "didn't feel like it". Pt refused to order lunch and educated on need for proper nutrition. Pt stated again "I don't feel like it". House tray requested for pt.

## 2018-06-24 NOTE — Progress Notes (Signed)
PROGRESS NOTE    Doris Howell  EHM:[id number] DOB: September 25, 1935 DOA: 06/18/2018 PCP: Lavone Orn, MD    Brief Narrative:  83 year old female who presented with dyspnea, she does have significant past medical history for heart failure, chronic kidney disease stage IV who was found bradycardic,and was referred to the emergency room. For the last 4 days she has been experiencing dyspnea with nonproductive cough and chest tightness. She will physical examination blood pressure 142/71, heart rate 50, respiratory rate 23, temperature 98.3, oxygen saturation 95% her lungs had bilateral expiratory wheezing, no rales, heart S1-S2 present with me, abdomen soft nontender, no lower extremity edema.Influenza A positive. Chest x-ray had cardiomegaly with vascular congestion, bilateral interstitial infiltrates. EKG sinus rhythm, 69 bpm, normal axis, left bundle branch block.  Patient was admitted to the hospital working diagnosis of acute decompensated heart failure.   Assessment & Plan:   Principal Problem:   Acute respiratory failure with hypoxia (HCC) Active Problems:   Diastolic CHF, acute on chronic (HCC)   Type 2 diabetes mellitus with stage 4 chronic kidney disease (HCC)   Essential hypertension   Gout   Sleep apnea   Pulmonary hypertension (HCC)   Influenza A   1. Acute hypoxic respiratory failure due to decompensated diastolic heart failure.Patient with persistent weakness and deconditioned. Continue withhydralazine. Holding furosemide. Clinically euvolemic.   2. Acute influenza A. Continue as needed bronchodilator therapy.Patient is very deconditioned, plan to transfer to SNF.   3. T2DM.Continue gucose cover and monitoring. Continue to have very poor oral intake.  4. HTN.Blood pressure control withhydralazine.  5. CKD stage IV. Will follow renal panel in am, avoid hypotension and nephrotoxic medications, patient continue to have very poor oral intake.   6.  Protein calorie malnutrition. On nutritional supplements.   DVT prophylaxis:enoxaparin Code Status:full Family Communication:no family at the bedside Disposition Plan/ discharge barriers:pending SNF placement  Body mass index is 28.76 kg/m. Malnutrition Type:  Nutrition Problem: Inadequate oral intake Etiology: decreased appetite   Malnutrition Characteristics:  Signs/Symptoms: meal completion < 25%   Nutrition Interventions:  Interventions: Liberalize Diet, MVI, Magic cup  RN Pressure Injury Documentation:     Consultants:     Procedures:     Antimicrobials:       Subjective: Patient is feeling very weak and deconditioned, has not been able to ambulate or get out of the bed. Continue to have very poor oral intake.   Objective: Vitals:   06/23/18 1922 06/23/18 1925 06/24/18 0619 06/24/18 0748  BP: (!) 138/55  (!) 153/81   Pulse: 79  85 77  Resp: 16  20 18   Temp: 98.1 F (36.7 C)  99 F (37.2 C)   TempSrc: Oral  Oral   SpO2: 96% 95% 92% 97%  Weight:   69 kg   Height:        Intake/Output Summary (Last 24 hours) at 06/24/2018 0918 Last data filed at 06/24/2018 0700 Gross per 24 hour  Intake 440 ml  Output 375 ml  Net 65 ml   Filed Weights   06/22/18 0600 06/23/18 0612 06/24/18 0619  Weight: 68.2 kg 69.1 kg 69 kg    Examination:   General: deconditioned and ill looking appearing  Neurology: Awake and alert, non focal  E ENT: mild pallor, no icterus, oral mucosa moist Cardiovascular: No JVD. S1-S2 present, rhythmic, no gallops, rubs, or murmurs. Trace lower extremity edema. Pulmonary: positive breath sounds bilaterally, adequate air movement, no wheezing, rhonchi or rales. Gastrointestinal. Abdomen with no organomegaly,  non tender, no rebound or guarding Skin. No rashes Musculoskeletal: no joint deformities     Data Reviewed: I have personally reviewed following labs and imaging studies  CBC: Recent Labs  Lab 06/18/18 1323  06/18/18 1806 06/19/18 0709 06/20/18 0452  WBC  --  3.5* 4.9 5.7  NEUTROABS  --  2.3  --  4.5  HGB 11.5* 11.1* 10.2* 10.4*  HCT  --  37.2 33.6* 34.5*  MCV  --  100.5* 98.5 97.7  PLT  --  123* 130* 390   Basic Metabolic Panel: Recent Labs  Lab 06/18/18 1415  06/18/18 1820 06/19/18 0709 06/20/18 0452 06/21/18 0621 06/22/18 0451 06/23/18 0408  NA 143   < >  --  144 144 144 143 143  K 3.2*   < >  --  3.3* 3.4* 3.5 3.4* 3.7  CL 108   < >  --  108 108 107 104 105  CO2 26   < >  --  24 26 27 27 26   GLUCOSE 145*   < >  --  89 94 105* 79 86  BUN 64*   < >  --  60* 54* 51* 52* 52*  CREATININE 3.25*   < >  --  3.17* 3.04* 3.10* 3.13* 3.07*  CALCIUM 8.5*  8.5*   < >  --  8.6* 9.1 8.9 9.0 9.1  MG  --   --  2.2  --   --   --   --   --   PHOS 3.2  --   --   --  2.7  --   --   --    < > = values in this interval not displayed.   GFR: Estimated Creatinine Clearance: 13 mL/min (A) (by C-G formula based on SCr of 3.07 mg/dL (H)). Liver Function Tests: Recent Labs  Lab 06/18/18 1415 06/19/18 0709 06/20/18 0452  AST  --  24  --   ALT  --  17  --   ALKPHOS  --  53  --   BILITOT  --  0.5  --   PROT  --  5.4*  --   ALBUMIN 3.3* 2.9* 3.0*   No results for input(s): LIPASE, AMYLASE in the last 168 hours. No results for input(s): AMMONIA in the last 168 hours. Coagulation Profile: No results for input(s): INR, PROTIME in the last 168 hours. Cardiac Enzymes: Recent Labs  Lab 06/19/18 0104 06/19/18 0709 06/19/18 1234  TROPONINI 0.09* 0.10* 0.09*   BNP (last 3 results) No results for input(s): PROBNP in the last 8760 hours. HbA1C: No results for input(s): HGBA1C in the last 72 hours. CBG: No results for input(s): GLUCAP in the last 168 hours. Lipid Profile: No results for input(s): CHOL, HDL, LDLCALC, TRIG, CHOLHDL, LDLDIRECT in the last 72 hours. Thyroid Function Tests: No results for input(s): TSH, T4TOTAL, FREET4, T3FREE, THYROIDAB in the last 72 hours. Anemia Panel: No  results for input(s): VITAMINB12, FOLATE, FERRITIN, TIBC, IRON, RETICCTPCT in the last 72 hours.    Radiology Studies: I have reviewed all of the imaging during this hospital visit personally     Scheduled Meds: . allopurinol  100 mg Oral BID  . aspirin EC  81 mg Oral Daily  . budesonide (PULMICORT) nebulizer solution  0.5 mg Nebulization BID  . fluticasone  2 spray Each Nare Daily  . guaiFENesin-dextromethorphan  5 mL Oral Q6H  . heparin  5,000 Units Subcutaneous Q8H  . hydrALAZINE  25 mg  Oral BID  . latanoprost  1 drop Both Eyes QHS  . loratadine  10 mg Oral Daily   Continuous Infusions:   LOS: 6 days        Jasslyn Finkel Gerome Apley, MD

## 2018-06-25 ENCOUNTER — Ambulatory Visit: Payer: Medicare Other | Admitting: Cardiovascular Disease

## 2018-06-25 LAB — BASIC METABOLIC PANEL
Anion gap: 7 (ref 5–15)
BUN: 45 mg/dL — ABNORMAL HIGH (ref 8–23)
CO2: 26 mmol/L (ref 22–32)
Calcium: 9 mg/dL (ref 8.9–10.3)
Chloride: 107 mmol/L (ref 98–111)
Creatinine, Ser: 3.09 mg/dL — ABNORMAL HIGH (ref 0.44–1.00)
GFR calc Af Amer: 16 mL/min — ABNORMAL LOW (ref >60)
GFR calc non Af Amer: 14 mL/min — ABNORMAL LOW (ref >60)
Glucose, Bld: 78 mg/dL (ref 70–99)
Potassium: 4 mmol/L (ref 3.5–5.1)
Sodium: 140 mmol/L (ref 135–145)

## 2018-06-25 NOTE — Progress Notes (Signed)
Patient will DC to: [place]  Anticipated DC date: 06/25/2018 Family notified: Yes Transport by: Family  Nurse to call report to [phone]. Patient will be reporting to room 103 P.    CSW will sign off for now as social work intervention is no longer needed. Please consult [place] again if new needs arise.  Jennavie Martinek, LCSW-A [place]/Clinical Social Work Department Cell: [phone]

## 2018-06-25 NOTE — Progress Notes (Signed)
Patient is alert and oriented discharge instructions CCMD called waiting on niece.

## 2018-06-25 NOTE — TOC Transition Note (Signed)
Transition of Care [facility]) - CM/SW Discharge Note   Patient Details  Name: Doris Howell MRN: [id number] Date of Birth: 1935-08-22  [facility] of [facility]) CM/SW Contact:  Gelene Mink, [place] Phone Number: 06/25/2018, 12:24 PM   Clinical Narrative:       Final next level of care: Skilled Nursing Facility Barriers to Discharge: No Barriers Identified   Patient Goals and CMS Choice Patient states their goals for this hospitalization and ongoing recovery are:: To return home and get healthy CMS Medicare.gov Compare Post Acute Care list provided to:: Patient Choice offered to / list presented to : Patient  Discharge Placement   Existing PASRR number confirmed : 06/23/18          Patient chooses bed at: [facility] Patient to be transferred to facility by: Family Name of family member notified: Tiffany Patient and family notified of of transfer: 06/25/18  Discharge Plan and Services    Patient will discharge to [facility] and be going to room 103 P.            Social Determinants of Health (SDOH) Interventions     Readmission Risk Interventions No flowsheet data found.

## 2018-06-25 NOTE — Progress Notes (Signed)
Physical Therapy Treatment Patient Details Name: Doris Howell MRN: [id number] DOB: 04-Sep-1935 Today's Date: 06/25/2018    History of Present Illness Patient is 83 y/o female presenting to hospital with SOB, chest tightness, and non productive cough. Patient positive for flu. PMH includes dCHF, DMII, HTN, sleep apnea, gout, and heart cath.     PT Comments    Pt admitted with above diagnosis. Pt currently with functional limitations due to balance and endurance deficits. Pt was able to ambulate with RW with min guard assist with good safety.  Progressing today and possibly d/c to SNF.   Pt will benefit from skilled PT to increase their independence and safety with mobility to allow discharge to the venue listed below.     Follow Up Recommendations  SNF;Supervision/Assistance - 24 hour     Equipment Recommendations  Other (comment)(TBD at next venue)    Recommendations for Other Services       Precautions / Restrictions Precautions Precautions: Fall Precaution Comments: reports 4 falls in last month Restrictions Weight Bearing Restrictions: No    Mobility  Bed Mobility Overal bed mobility: Needs Assistance Bed Mobility: Supine to Sit     Supine to sit: Min guard     General bed mobility comments: Pt needed increased time and effort.   Transfers Overall transfer level: Needs assistance Equipment used: Rolling walker (2 wheeled) Transfers: Sit to/from [facility] Sit to Stand: Supervision            Ambulation/Gait Ambulation/Gait assistance: Min guard Gait Distance (Feet): 150 Feet Assistive device: Rolling walker (2 wheeled) Gait Pattern/deviations: Step-through pattern;Decreased stride length   Gait velocity interpretation: <1.31 ft/sec, indicative of household ambulator General Gait Details: Pt able to ambulate with RW without LOB.  Moving well overall.     Stairs             Wheelchair Mobility    Modified Rankin (Stroke  Patients Only)       Balance Overall balance assessment: Needs assistance Sitting-balance support: No upper extremity supported;Feet supported Sitting balance-Leahy Scale: Good     Standing balance support: Bilateral upper extremity supported Standing balance-Leahy Scale: Fair                              Cognition Arousal/Alertness: Awake/alert Behavior During Therapy: WFL for tasks assessed/performed Overall Cognitive Status: Within Functional Limits for tasks assessed                                        Exercises      General Comments        Pertinent Vitals/Pain Pain Assessment: No/denies pain    Home Living Family/patient expects to be discharged to:: Private residence Living Arrangements: Spouse/significant other Available Help at Discharge: Friend(s);Available PRN/intermittently Type of Home: House Home Access: Stairs to enter Entrance Stairs-Rails: None Home Layout: One level Home Equipment: Environmental consultant - 2 wheels;Walker - 4 wheels;Cane - single point Additional Comments: Patient reports husband cannot provide necessary assistance at home.    Prior Function Level of Independence: Independent with assistive device(s)      Comments: reports using a cane or RW for mobility    PT Goals (current goals can now be found in the care plan section) Acute Rehab PT Goals Patient Stated Goal: go home Progress towards PT goals: Progressing toward goals  Frequency    Min 2X/week      PT Plan Current plan remains appropriate    Co-evaluation              AM-PAC PT "6 Clicks" Mobility   Outcome Measure  Help needed turning from your back to your side while in a flat bed without using bedrails?: None Help needed moving from lying on your back to sitting on the side of a flat bed without using bedrails?: None Help needed moving to and from a bed to a chair (including a wheelchair)?: A Little Help needed standing up from a  chair using your arms (e.g., wheelchair or bedside chair)?: A Little Help needed to walk in hospital room?: A Little Help needed climbing 3-5 steps with a railing? : A Lot 6 Click Score: 19    End of Session Equipment Utilized During Treatment: Gait belt Activity Tolerance: Patient limited by fatigue Patient left: with call bell/phone within reach;in bed;with bed alarm set Nurse Communication: Mobility status PT Visit Diagnosis: Unsteadiness on feet (R26.81);Other abnormalities of gait and mobility (R26.89);Muscle weakness (generalized) (M62.81);History of falling (Z91.81)     Time: 1130-1146 PT Time Calculation (min) (ACUTE ONLY): 16 min  Charges:  $Gait Training: 8-22 mins                     [place] Pager:  [phone]  Office:  [place] 06/25/2018, 2:59 PM

## 2018-06-25 NOTE — Discharge Summary (Signed)
Physician Discharge Summary  Doris Howell SWH:[id number] DOB: 1935/04/27 DOA: 06/18/2018  PCP: Lavone Orn, MD  Admit date: 06/18/2018 Discharge date: 06/25/2018  Admitted From: Home  Disposition:  SNF  Recommendations for Outpatient Follow-up and new medication changes:  1. Follow up with Dr. [name] in 2 weeks.  2. Patient will resume torsemide at discharge, please monitor volume status, if patient continue to have very poor oral intake, dose will need to be adjusted. 3. Follow on renal panel in 3 days.   Home Health: Na   Equipment/Devices: Na    Discharge Condition: Stable  CODE STATUS: full  Diet recommendation: Heart healthy   Brief/Interim Summary: 83 year old female who presented with dyspnea.  She does have significant past medical history for heart failure, and chronic kidney disease stage IV who was found bradycardic at the outpatient infusion center (getting IV iron),and was referred to the emergency room. For the last 4 days she has been experiencing dyspnea with nonproductive cough and chest tightness. On her initial physical examination her blood pressure was 142/71, heart rate 50, respiratory rate 23, temperature 98.3, oxygen saturation 95%, her lungs had bilateral expiratory wheezing, no rales, heart S1-S2 present and rhythmic, abdomen soft nontender, no lower extremity edema.Influenza A positive.  Sodium 144, potassium 4.3, chloride 109, bicarb 24, glucose 24, BUN 64, creatinine 3.3, BNP 3030, white cell count 3.5, hemoglobin 11.1, hematocrit 37.2, platelet 123.  Urinalysis negative for infection.  Chest x-ray had cardiomegaly with vascular congestion, bilateral interstitial infiltrates. EKG sinus rhythm, 69 bpm, normal axis, left bundle branch block.  Patient was admitted to the hospital working diagnosis of acute decompensated heart failure.  1.  Acute hypoxic respiratory failure due to decompensated acute on chronic diastolic heart failure.  Patient was  admitted to the telemetry unit, she received IV diuresis, negative fluid balance was achieved, with significant improvement of her symptoms.  Patient had a significant decrease in oral intake, her diuresis was held.  At discharge patient has remained euvolemic. Continue heart failure management with carvedilol and hydralazine. Diuresis with torsemide.   2.  Acute influenza A.  She received oseltamavir, bronchodilator therapy, oximetry monitoring and supplemental oxygen per nasal cannula.  Patient developed significant weakness, deconditioning, and failure to thrive.  Physical therapy was consulted, recommendations for continue physical therapy at a skilled nursing facility.  Patient has had very poor oral intake.  3.  Type 2 diabetes mellitus.  Patient was placed on insulin sliding scale for glucose coverage and monitoring, very poor oral intake.  4.  Hypertension.  Continue blood pressure control with hydralazine.  5.  Chronic kidney disease stage IV.  Kidney function remained stable, patient will resume diuresis therapy at discharge.  Her discharge creatinine is 3.0, sodium 140, potassium 4.0, serum bicarb 26.  Her base creatinine is around 3.5.  6.  Protein calorie malnutrition.  Continue nutritional supplements.  Discharge Diagnoses:  Principal Problem:   Acute respiratory failure with hypoxia (HCC) Active Problems:   Diastolic CHF, acute on chronic (HCC)   Type 2 diabetes mellitus with stage 4 chronic kidney disease (HCC)   Essential hypertension   Gout   Sleep apnea   Pulmonary hypertension (HCC)   Influenza A    Discharge Instructions   Allergies as of 06/25/2018      Reactions   Other Nausea Only   pholcodeine   Simvastatin Other (See Comments)   Myalgias- MD took patient off med Myalgias- MD took patient off med   Codeine Other (See Comments)  Unknown; pt can't remember. It was in the '70s Unknown; pt can't remember. It was in the '70s      Medication List    TAKE  these medications   albuterol 108 (90 Base) MCG/ACT inhaler Commonly known as:  PROVENTIL HFA;VENTOLIN HFA Inhale 2 puffs into the lungs every 6 (six) hours as needed for wheezing or shortness of breath.   allopurinol 100 MG tablet Commonly known as:  ZYLOPRIM Take 100 mg by mouth 2 (two) times daily.   aspirin EC 81 MG tablet Take 81 mg by mouth daily.   carvedilol 12.5 MG tablet Commonly known as:  COREG Take 12.5 mg by mouth 2 (two) times daily with a meal.   hydrALAZINE 25 MG tablet Commonly known as:  APRESOLINE Take 25 mg by mouth 2 (two) times daily.   latanoprost 0.005 % ophthalmic solution Commonly known as:  XALATAN Place 1 drop into both eyes at bedtime.   nitroGLYCERIN 0.4 MG SL tablet Commonly known as:  NITROSTAT Place 1 tablet (0.4 mg total) under the tongue every 5 (five) minutes as needed for chest pain.   torsemide 20 MG tablet Commonly known as:  DEMADEX Take 1-2 tablets (20-40 mg total) by mouth 2 (two) times daily. Take 2 tabs daily at 8 AM and 1 tab daily after lunch at 1 PM. What changed:    when to take this  additional instructions   Vitamin D (Cholecalciferol) 10 MCG (400 UNIT) Caps Take 2 capsules by mouth daily.      Contact information for after-discharge care    Destination    [facility] .   Service:  Skilled Nursing Contact information: [place] [address] [phone]             Allergies  Allergen Reactions  . Other Nausea Only    pholcodeine  . Simvastatin Other (See Comments)    Myalgias- MD took patient off med Myalgias- MD took patient off med  . Codeine Other (See Comments)    Unknown; pt can't remember. It was in the '70s Unknown; pt can't remember. It was in the '70s    Consultations:     Procedures/Studies: Dg Chest 2 View  Result Date: 06/18/2018 CLINICAL DATA:  83 y/o  F; cough, shortness of breath, and fatigue. EXAM: CHEST - 2 VIEW COMPARISON:   08/30/2016 chest radiograph. FINDINGS: Stable cardiomegaly given projection and technique. Aortic calcific atherosclerosis. Interstitial pulmonary edema and patchy basilar opacities. No pleural effusion or pneumothorax. Flowing anterior ossification of thoracic spine compatible with DISH. No acute osseous abnormality is evident. IMPRESSION: Cardiomegaly and interstitial pulmonary edema. Patchy basilar opacities may represent developing alveolar edema or pneumonia. Electronically Signed   By: Kristine Garbe M.D.   On: 06/18/2018 19:19       Subjective: Patient is feeling better, but not back to baseline, continue to be very weak and deconditioned.   Discharge Exam: Vitals:   06/25/18 0650 06/25/18 0857  BP: (!) 143/88   Pulse: 85   Resp: 16   Temp: 98.4 F (36.9 C)   SpO2: 98% 96%   Vitals:   06/24/18 1924 06/24/18 2048 06/25/18 0650 06/25/18 0857  BP:  (!) 149/78 (!) 143/88   Pulse:  76 85   Resp:  18 16   Temp:  98.4 F (36.9 C) 98.4 F (36.9 C)   TempSrc:  Oral Oral   SpO2: 94% 94% 98% 96%  Weight:   69.9 kg  Height:        General: Not in pain or dyspnea Neurology: Awake and alert, non focal  E ENT: mild pallor, no icterus, oral mucosa moist Cardiovascular: No JVD. S1-S2 present, rhythmic, no gallops, rubs, or murmurs. No lower extremity edema. Pulmonary: positive breath sounds bilaterally, adequate air movement, no wheezing, rhonchi or rales. Gastrointestinal. Abdomen with no organomegaly, non tender, no rebound or guarding Skin. No rashes Musculoskeletal: no joint deformities   The results of significant diagnostics from this hospitalization (including imaging, microbiology, ancillary and laboratory) are listed below for reference.     Microbiology: Recent Results (from the past 240 hour(s))  Culture, blood (routine x 2)     Status: None   Collection Time: 06/18/18  9:17 PM  Result Value Ref Range Status   Specimen Description BLOOD LEFT HAND  Final    Special Requests   Final    BOTTLES DRAWN AEROBIC AND ANAEROBIC Blood Culture adequate volume   Culture   Final    NO GROWTH 5 DAYS Performed at [facility], [address], [place], [place] [address]    Report Status 06/23/2018 FINAL  Final  Respiratory Panel by PCR     Status: None   Collection Time: 06/18/18  9:25 PM  Result Value Ref Range Status   Adenovirus NOT DETECTED NOT DETECTED Final   Coronavirus 229E NOT DETECTED NOT DETECTED Final    Comment: (NOTE) The Coronavirus on the Respiratory Panel, DOES NOT test for the novel  Coronavirus (2019 nCoV)    Coronavirus HKU1 NOT DETECTED NOT DETECTED Final   Coronavirus NL63 NOT DETECTED NOT DETECTED Final   Coronavirus OC43 NOT DETECTED NOT DETECTED Final   Metapneumovirus NOT DETECTED NOT DETECTED Final   Rhinovirus / Enterovirus NOT DETECTED NOT DETECTED Final   Influenza A NOT DETECTED NOT DETECTED Final   Influenza B NOT DETECTED NOT DETECTED Final   Parainfluenza Virus 1 NOT DETECTED NOT DETECTED Final   Parainfluenza Virus 2 NOT DETECTED NOT DETECTED Final   Parainfluenza Virus 3 NOT DETECTED NOT DETECTED Final   Parainfluenza Virus 4 NOT DETECTED NOT DETECTED Final   Respiratory Syncytial Virus NOT DETECTED NOT DETECTED Final   Bordetella pertussis NOT DETECTED NOT DETECTED Final   Chlamydophila pneumoniae NOT DETECTED NOT DETECTED Final   Mycoplasma pneumoniae NOT DETECTED NOT DETECTED Final    Comment: Performed at [facility], [place]. [address], [place], [place] [address]  Culture, blood (routine x 2)     Status: None   Collection Time: 06/18/18  9:42 PM  Result Value Ref Range Status   Specimen Description BLOOD RIGHT FOREARM  Final   Special Requests   Final    BOTTLES DRAWN AEROBIC AND ANAEROBIC Blood Culture adequate volume   Culture   Final    NO GROWTH 5 DAYS Performed at [facility], [place] [address] [place], [place] [address]    Report Status 06/23/2018 FINAL  Final  Urine culture      Status: Abnormal   Collection Time: 06/18/18 10:25 PM  Result Value Ref Range Status   Specimen Description URINE, RANDOM  Final   Special Requests NONE  Final   Culture (A)  Final    >=100,000 COLONIES/mL GROUP B STREP(S.AGALACTIAE)ISOLATED TESTING AGAINST S. AGALACTIAE NOT ROUTINELY PERFORMED DUE TO PREDICTABILITY OF AMP/PEN/VAN SUSCEPTIBILITY. Performed at [facility], [place] [address] [place], [place] [address]    Report Status 06/20/2018 FINAL  Final  Labs: BNP (last 3 results) Recent Labs    06/18/18 2036  BNP 3,825.0*   Basic Metabolic Panel: Recent Labs  Lab 06/18/18 1415  06/18/18 1820  06/20/18 0452 06/21/18 0621 06/22/18 0451 06/23/18 0408 06/25/18 0639  NA 143   < >  --    < > 144 144 143 143 140  K 3.2*   < >  --    < > 3.4* 3.5 3.4* 3.7 4.0  CL 108   < >  --    < > 108 107 104 105 107  CO2 26   < >  --    < > 26 27 27 26 26   GLUCOSE 145*   < >  --    < > 94 105* 79 86 78  BUN 64*   < >  --    < > 54* 51* 52* 52* 45*  CREATININE 3.25*   < >  --    < > 3.04* 3.10* 3.13* 3.07* 3.09*  CALCIUM 8.5*  8.5*   < >  --    < > 9.1 8.9 9.0 9.1 9.0  MG  --   --  2.2  --   --   --   --   --   --   PHOS 3.2  --   --   --  2.7  --   --   --   --    < > = values in this interval not displayed.   Liver Function Tests: Recent Labs  Lab 06/18/18 1415 06/19/18 0709 06/20/18 0452  AST  --  24  --   ALT  --  17  --   ALKPHOS  --  53  --   BILITOT  --  0.5  --   PROT  --  5.4*  --   ALBUMIN 3.3* 2.9* 3.0*   No results for input(s): LIPASE, AMYLASE in the last 168 hours. No results for input(s): AMMONIA in the last 168 hours. CBC: Recent Labs  Lab 06/18/18 1323 06/18/18 1806 06/19/18 0709 06/20/18 0452  WBC  --  3.5* 4.9 5.7  NEUTROABS  --  2.3  --  4.5  HGB 11.5* 11.1* 10.2* 10.4*  HCT  --  37.2 33.6* 34.5*  MCV  --  100.5* 98.5 97.7  PLT  --  123* 130* 150   Cardiac Enzymes: Recent Labs  Lab 06/19/18 0104 06/19/18 0709 06/19/18 1234   TROPONINI 0.09* 0.10* 0.09*   BNP: Invalid input(s): POCBNP CBG: No results for input(s): GLUCAP in the last 168 hours. D-Dimer No results for input(s): DDIMER in the last 72 hours. Hgb A1c No results for input(s): HGBA1C in the last 72 hours. Lipid Profile No results for input(s): CHOL, HDL, LDLCALC, TRIG, CHOLHDL, LDLDIRECT in the last 72 hours. Thyroid function studies No results for input(s): TSH, T4TOTAL, T3FREE, THYROIDAB in the last 72 hours.  Invalid input(s): FREET3 Anemia work up No results for input(s): VITAMINB12, FOLATE, FERRITIN, TIBC, IRON, RETICCTPCT in the last 72 hours. Urinalysis    Component Value Date/Time   COLORURINE YELLOW 06/18/2018 2124   APPEARANCEUR CLEAR 06/18/2018 2124   LABSPEC 1.011 06/18/2018 2124   PHURINE 5.0 06/18/2018 2124   GLUCOSEU NEGATIVE 06/18/2018 2124   HGBUR NEGATIVE 06/18/2018 2124   BILIRUBINUR NEGATIVE 06/18/2018 2124   KETONESUR NEGATIVE 06/18/2018 2124   PROTEINUR 30 (A) 06/18/2018 2124   UROBILINOGEN 0.2 08/29/2014 1848   NITRITE NEGATIVE 06/18/2018 2124   LEUKOCYTESUR TRACE (A)  06/18/2018 2124   Sepsis Labs Invalid input(s): PROCALCITONIN,  WBC,  LACTICIDVEN Microbiology Recent Results (from the past 240 hour(s))  Culture, blood (routine x 2)     Status: None   Collection Time: 06/18/18  9:17 PM  Result Value Ref Range Status   Specimen Description BLOOD LEFT HAND  Final   Special Requests   Final    BOTTLES DRAWN AEROBIC AND ANAEROBIC Blood Culture adequate volume   Culture   Final    NO GROWTH 5 DAYS Performed at [facility], [address], [place], [place] [address]    Report Status 06/23/2018 FINAL  Final  Respiratory Panel by PCR     Status: None   Collection Time: 06/18/18  9:25 PM  Result Value Ref Range Status   Adenovirus NOT DETECTED NOT DETECTED Final   Coronavirus 229E NOT DETECTED NOT DETECTED Final    Comment: (NOTE) The Coronavirus on the Respiratory Panel, DOES NOT test for the novel   Coronavirus (2019 nCoV)    Coronavirus HKU1 NOT DETECTED NOT DETECTED Final   Coronavirus NL63 NOT DETECTED NOT DETECTED Final   Coronavirus OC43 NOT DETECTED NOT DETECTED Final   Metapneumovirus NOT DETECTED NOT DETECTED Final   Rhinovirus / Enterovirus NOT DETECTED NOT DETECTED Final   Influenza A NOT DETECTED NOT DETECTED Final   Influenza B NOT DETECTED NOT DETECTED Final   Parainfluenza Virus 1 NOT DETECTED NOT DETECTED Final   Parainfluenza Virus 2 NOT DETECTED NOT DETECTED Final   Parainfluenza Virus 3 NOT DETECTED NOT DETECTED Final   Parainfluenza Virus 4 NOT DETECTED NOT DETECTED Final   Respiratory Syncytial Virus NOT DETECTED NOT DETECTED Final   Bordetella pertussis NOT DETECTED NOT DETECTED Final   Chlamydophila pneumoniae NOT DETECTED NOT DETECTED Final   Mycoplasma pneumoniae NOT DETECTED NOT DETECTED Final    Comment: Performed at [facility], [place]. [address], [place], [place] [address]  Culture, blood (routine x 2)     Status: None   Collection Time: 06/18/18  9:42 PM  Result Value Ref Range Status   Specimen Description BLOOD RIGHT FOREARM  Final   Special Requests   Final    BOTTLES DRAWN AEROBIC AND ANAEROBIC Blood Culture adequate volume   Culture   Final    NO GROWTH 5 DAYS Performed at [facility], [place] [address], [place], [place] [address]    Report Status 06/23/2018 FINAL  Final  Urine culture     Status: Abnormal   Collection Time: 06/18/18 10:25 PM  Result Value Ref Range Status   Specimen Description URINE, RANDOM  Final   Special Requests NONE  Final   Culture (A)  Final    >=100,000 COLONIES/mL GROUP B STREP(S.AGALACTIAE)ISOLATED TESTING AGAINST S. AGALACTIAE NOT ROUTINELY PERFORMED DUE TO PREDICTABILITY OF AMP/PEN/VAN SUSCEPTIBILITY. Performed at [facility], [place] [address] [place], [place] [address]    Report Status 06/20/2018 FINAL  Final     Time coordinating discharge: 45 minutes  SIGNED:   Tawni Millers, MD  [facility] 06/25/2018, 9:15 AM

## 2018-06-26 ENCOUNTER — Encounter: Payer: Self-pay | Admitting: Cardiovascular Disease

## 2018-06-27 ENCOUNTER — Encounter (HOSPITAL_COMMUNITY): Payer: Medicare Other

## 2018-07-02 ENCOUNTER — Inpatient Hospital Stay (HOSPITAL_COMMUNITY): Admission: RE | Admit: 2018-07-02 | Payer: Medicare Other | Source: Ambulatory Visit

## 2018-07-16 ENCOUNTER — Encounter (HOSPITAL_COMMUNITY): Payer: Medicare Other

## 2018-08-23 ENCOUNTER — Other Ambulatory Visit: Payer: Self-pay

## 2018-08-23 ENCOUNTER — Ambulatory Visit (HOSPITAL_COMMUNITY)
Admission: RE | Admit: 2018-08-23 | Discharge: 2018-08-23 | Disposition: A | Discharge status: Home / Self Care | Payer: Medicare Other | Source: Ambulatory Visit | Attending: Nephrology | Admitting: Nephrology

## 2018-08-23 ENCOUNTER — Other Ambulatory Visit
Admission: RE | Admit: 2018-08-23 | Discharge: 2018-08-23 | Disposition: A | Discharge status: Home / Self Care | Payer: Medicare Other | Source: Other Acute Inpatient Hospital | Attending: Nephrology | Admitting: Nephrology

## 2018-08-23 DIAGNOSIS — N184 Chronic kidney disease, stage 4 (severe): Secondary | ICD-10-CM | POA: Insufficient documentation

## 2018-08-23 DIAGNOSIS — D631 Anemia in chronic kidney disease: Secondary | ICD-10-CM | POA: Insufficient documentation

## 2018-08-23 LAB — RENAL FUNCTION PANEL
Albumin: 3.6 g/dL (ref 3.5–5.0)
Anion gap: 14 (ref 5–15)
BUN: 77 mg/dL — ABNORMAL HIGH (ref 8–23)
CO2: 25 mmol/L (ref 22–32)
Calcium: 9.1 mg/dL (ref 8.9–10.3)
Chloride: 103 mmol/L (ref 98–111)
Creatinine, Ser: 3.56 mg/dL — ABNORMAL HIGH (ref 0.44–1.00)
GFR calc Af Amer: 13 mL/min — ABNORMAL LOW (ref >60)
GFR calc non Af Amer: 11 mL/min — ABNORMAL LOW (ref >60)
Glucose, Bld: 166 mg/dL — ABNORMAL HIGH (ref 70–99)
Phosphorus: 5.3 mg/dL — ABNORMAL HIGH (ref 2.5–4.6)
Potassium: 3.2 mmol/L — ABNORMAL LOW (ref 3.5–5.1)
Sodium: 142 mmol/L (ref 135–145)

## 2018-08-23 LAB — IRON AND TIBC
Iron: 69 ug/dL (ref 28–170)
Saturation Ratios: 24 % (ref 10.4–31.8)
TIBC: 287 ug/dL (ref 250–450)
UIBC: 218 ug/dL

## 2018-08-23 LAB — POCT HEMOGLOBIN-HEMACUE: Hemoglobin: 10.7 g/dL — ABNORMAL LOW (ref 12.0–15.0)

## 2018-08-23 LAB — FERRITIN: Ferritin: 227 ng/mL (ref 11–307)

## 2018-08-23 MED ORDER — EPOETIN ALFA 20000 UNIT/ML IJ SOLN
20000.0000 [IU] | INTRAMUSCULAR | Status: DC
  Administered 2018-08-23: 13:00:00 20000 [IU] via SUBCUTANEOUS

## 2018-08-23 MED ORDER — EPOETIN ALFA 20000 UNIT/ML IJ SOLN
INTRAMUSCULAR | Status: AC
  Administered 2018-08-23: 13:00:00 20000 [IU] via SUBCUTANEOUS
  Filled 2018-08-23: qty 1

## 2018-08-24 LAB — PTH, INTACT AND CALCIUM
Calcium, Total (PTH): 8.9 mg/dL (ref 8.7–10.3)
PTH: 204 pg/mL — ABNORMAL HIGH (ref 15–65)

## 2018-09-05 ENCOUNTER — Other Ambulatory Visit: Payer: Self-pay

## 2018-09-06 ENCOUNTER — Ambulatory Visit (HOSPITAL_COMMUNITY)
Admission: RE | Admit: 2018-09-06 | Discharge: 2018-09-06 | Disposition: A | Discharge status: Home / Self Care | Payer: Medicare Other | Source: Ambulatory Visit | Attending: Nephrology | Admitting: Nephrology

## 2018-09-06 VITALS — BP 120/52 | HR 59 | Temp 97.7°F | Resp 20

## 2018-09-06 DIAGNOSIS — N184 Chronic kidney disease, stage 4 (severe): Secondary | ICD-10-CM | POA: Insufficient documentation

## 2018-09-06 LAB — POCT HEMOGLOBIN-HEMACUE: Hemoglobin: 10.1 g/dL — ABNORMAL LOW (ref 12.0–15.0)

## 2018-09-06 MED ORDER — EPOETIN ALFA 20000 UNIT/ML IJ SOLN
20000.0000 [IU] | INTRAMUSCULAR | Status: DC
  Administered 2018-09-06: 13:00:00 20000 [IU] via SUBCUTANEOUS

## 2018-09-06 MED ORDER — EPOETIN ALFA 20000 UNIT/ML IJ SOLN
INTRAMUSCULAR | Status: AC
  Administered 2018-09-06: 20000 [IU] via SUBCUTANEOUS
  Filled 2018-09-06: qty 1

## 2018-09-20 ENCOUNTER — Ambulatory Visit (HOSPITAL_COMMUNITY)
Admission: RE | Admit: 2018-09-20 | Discharge: 2018-09-20 | Disposition: A | Discharge status: Home / Self Care | Payer: Medicare Other | Source: Ambulatory Visit | Attending: Nephrology | Admitting: Nephrology

## 2018-09-20 ENCOUNTER — Other Ambulatory Visit: Payer: Self-pay

## 2018-09-20 VITALS — BP 132/41 | HR 61 | Temp 96.7°F | Resp 20

## 2018-09-20 DIAGNOSIS — N184 Chronic kidney disease, stage 4 (severe): Secondary | ICD-10-CM | POA: Diagnosis not present

## 2018-09-20 LAB — IRON AND TIBC
Iron: 70 ug/dL (ref 28–170)
Saturation Ratios: 23 % (ref 10.4–31.8)
TIBC: 298 ug/dL (ref 250–450)
UIBC: 228 ug/dL

## 2018-09-20 LAB — RENAL FUNCTION PANEL
Albumin: 3.5 g/dL (ref 3.5–5.0)
Anion gap: 12 (ref 5–15)
BUN: 83 mg/dL — ABNORMAL HIGH (ref 8–23)
CO2: 26 mmol/L (ref 22–32)
Calcium: 9.1 mg/dL (ref 8.9–10.3)
Chloride: 106 mmol/L (ref 98–111)
Creatinine, Ser: 3.66 mg/dL — ABNORMAL HIGH (ref 0.44–1.00)
GFR calc Af Amer: 13 mL/min — ABNORMAL LOW (ref >60)
GFR calc non Af Amer: 11 mL/min — ABNORMAL LOW (ref >60)
Glucose, Bld: 149 mg/dL — ABNORMAL HIGH (ref 70–99)
Phosphorus: 5.6 mg/dL — ABNORMAL HIGH (ref 2.5–4.6)
Potassium: 3.7 mmol/L (ref 3.5–5.1)
Sodium: 144 mmol/L (ref 135–145)

## 2018-09-20 LAB — POCT HEMOGLOBIN-HEMACUE: Hemoglobin: 11 g/dL — ABNORMAL LOW (ref 12.0–15.0)

## 2018-09-20 LAB — FERRITIN: Ferritin: 169 ng/mL (ref 11–307)

## 2018-09-20 MED ORDER — EPOETIN ALFA 20000 UNIT/ML IJ SOLN
20000.0000 [IU] | INTRAMUSCULAR | Status: DC
  Administered 2018-09-20: 15:00:00 20000 [IU] via SUBCUTANEOUS

## 2018-09-20 MED ORDER — EPOETIN ALFA 20000 UNIT/ML IJ SOLN
INTRAMUSCULAR | Status: AC
  Administered 2018-09-20: 20000 [IU] via SUBCUTANEOUS
  Filled 2018-09-20: qty 1

## 2018-09-21 LAB — PTH, INTACT AND CALCIUM
Calcium, Total (PTH): 8.9 mg/dL (ref 8.7–10.3)
PTH: 208 pg/mL — ABNORMAL HIGH (ref 15–65)

## 2018-10-04 ENCOUNTER — Other Ambulatory Visit: Payer: Self-pay

## 2018-10-04 ENCOUNTER — Ambulatory Visit (HOSPITAL_COMMUNITY)
Admission: RE | Admit: 2018-10-04 | Discharge: 2018-10-04 | Disposition: A | Discharge status: Home / Self Care | Payer: Medicare Other | Source: Ambulatory Visit | Attending: Nephrology | Admitting: Nephrology

## 2018-10-04 DIAGNOSIS — N184 Chronic kidney disease, stage 4 (severe): Secondary | ICD-10-CM | POA: Insufficient documentation

## 2018-10-04 LAB — POCT HEMOGLOBIN-HEMACUE: Hemoglobin: 10.8 g/dL — ABNORMAL LOW (ref 12.0–15.0)

## 2018-10-04 MED ORDER — EPOETIN ALFA 20000 UNIT/ML IJ SOLN
INTRAMUSCULAR | Status: AC
  Filled 2018-10-04: qty 1

## 2018-10-04 MED ORDER — EPOETIN ALFA 20000 UNIT/ML IJ SOLN
20000.0000 [IU] | INTRAMUSCULAR | Status: DC
  Administered 2018-10-04: 20000 [IU] via SUBCUTANEOUS

## 2018-10-18 ENCOUNTER — Ambulatory Visit (HOSPITAL_COMMUNITY)
Admission: RE | Admit: 2018-10-18 | Discharge: 2018-10-18 | Disposition: A | Discharge status: Home / Self Care | Payer: Medicare Other | Source: Ambulatory Visit | Attending: Nephrology | Admitting: Nephrology

## 2018-10-18 ENCOUNTER — Other Ambulatory Visit: Payer: Self-pay

## 2018-10-18 VITALS — BP 135/68 | HR 52 | Temp 95.6°F | Resp 20

## 2018-10-18 DIAGNOSIS — N184 Chronic kidney disease, stage 4 (severe): Secondary | ICD-10-CM | POA: Diagnosis not present

## 2018-10-18 LAB — RENAL FUNCTION PANEL
Albumin: 3.7 g/dL (ref 3.5–5.0)
Anion gap: 14 (ref 5–15)
BUN: 76 mg/dL — ABNORMAL HIGH (ref 8–23)
CO2: 25 mmol/L (ref 22–32)
Calcium: 9.3 mg/dL (ref 8.9–10.3)
Chloride: 102 mmol/L (ref 98–111)
Creatinine, Ser: 3.93 mg/dL — ABNORMAL HIGH (ref 0.44–1.00)
GFR calc Af Amer: 12 mL/min — ABNORMAL LOW (ref >60)
GFR calc non Af Amer: 10 mL/min — ABNORMAL LOW (ref >60)
Glucose, Bld: 119 mg/dL — ABNORMAL HIGH (ref 70–99)
Phosphorus: 5.9 mg/dL — ABNORMAL HIGH (ref 2.5–4.6)
Potassium: 3.3 mmol/L — ABNORMAL LOW (ref 3.5–5.1)
Sodium: 141 mmol/L (ref 135–145)

## 2018-10-18 LAB — IRON AND TIBC
Iron: 60 ug/dL (ref 28–170)
Saturation Ratios: 20 % (ref 10.4–31.8)
TIBC: 305 ug/dL (ref 250–450)
UIBC: 245 ug/dL

## 2018-10-18 LAB — FERRITIN: Ferritin: 150 ng/mL (ref 11–307)

## 2018-10-18 LAB — POCT HEMOGLOBIN-HEMACUE: Hemoglobin: 11.3 g/dL — ABNORMAL LOW (ref 12.0–15.0)

## 2018-10-18 MED ORDER — EPOETIN ALFA 20000 UNIT/ML IJ SOLN
20000.0000 [IU] | INTRAMUSCULAR | Status: DC
  Administered 2018-10-18: 15:00:00 20000 [IU] via SUBCUTANEOUS

## 2018-10-18 MED ORDER — EPOETIN ALFA 20000 UNIT/ML IJ SOLN
INTRAMUSCULAR | Status: AC
  Administered 2018-10-18: 20000 [IU] via SUBCUTANEOUS
  Filled 2018-10-18: qty 1

## 2018-10-19 LAB — PTH, INTACT AND CALCIUM
Calcium, Total (PTH): 9.1 mg/dL (ref 8.7–10.3)
PTH: 248 pg/mL — ABNORMAL HIGH (ref 15–65)

## 2018-11-01 ENCOUNTER — Encounter (HOSPITAL_COMMUNITY): Payer: Medicare Other

## 2018-11-01 ENCOUNTER — Ambulatory Visit (HOSPITAL_COMMUNITY)
Admission: RE | Admit: 2018-11-01 | Discharge: 2018-11-01 | Disposition: A | Discharge status: Home / Self Care | Payer: Medicare Other | Source: Ambulatory Visit | Attending: Nephrology | Admitting: Nephrology

## 2018-11-01 ENCOUNTER — Other Ambulatory Visit: Payer: Self-pay

## 2018-11-01 VITALS — BP 137/59 | HR 57 | Temp 98.2°F | Resp 18

## 2018-11-01 DIAGNOSIS — N184 Chronic kidney disease, stage 4 (severe): Secondary | ICD-10-CM | POA: Insufficient documentation

## 2018-11-01 LAB — POCT HEMOGLOBIN-HEMACUE: Hemoglobin: 11.3 g/dL — ABNORMAL LOW (ref 12.0–15.0)

## 2018-11-01 MED ORDER — EPOETIN ALFA 20000 UNIT/ML IJ SOLN
20000.0000 [IU] | INTRAMUSCULAR | Status: DC
  Administered 2018-11-01: 20000 [IU] via SUBCUTANEOUS

## 2018-11-01 MED ORDER — EPOETIN ALFA 20000 UNIT/ML IJ SOLN
INTRAMUSCULAR | Status: AC
  Administered 2018-11-01: 20000 [IU] via SUBCUTANEOUS
  Filled 2018-11-01: qty 1

## 2018-11-12 ENCOUNTER — Ambulatory Visit: Payer: Medicare Other | Admitting: Cardiovascular Disease

## 2018-11-15 ENCOUNTER — Other Ambulatory Visit: Payer: Self-pay

## 2018-11-15 ENCOUNTER — Ambulatory Visit (HOSPITAL_COMMUNITY)
Admission: RE | Admit: 2018-11-15 | Discharge: 2018-11-15 | Disposition: A | Discharge status: Home / Self Care | Payer: Medicare Other | Source: Ambulatory Visit | Attending: Nephrology | Admitting: Nephrology

## 2018-11-15 VITALS — BP 119/61 | HR 58 | Temp 97.3°F | Resp 20

## 2018-11-15 DIAGNOSIS — N184 Chronic kidney disease, stage 4 (severe): Secondary | ICD-10-CM | POA: Insufficient documentation

## 2018-11-15 LAB — RENAL FUNCTION PANEL
Albumin: 3.7 g/dL (ref 3.5–5.0)
Anion gap: 13 (ref 5–15)
BUN: 71 mg/dL — ABNORMAL HIGH (ref 8–23)
CO2: 25 mmol/L (ref 22–32)
Calcium: 9.2 mg/dL (ref 8.9–10.3)
Chloride: 103 mmol/L (ref 98–111)
Creatinine, Ser: 3.86 mg/dL — ABNORMAL HIGH (ref 0.44–1.00)
GFR calc Af Amer: 12 mL/min — ABNORMAL LOW (ref >60)
GFR calc non Af Amer: 10 mL/min — ABNORMAL LOW (ref >60)
Glucose, Bld: 126 mg/dL — ABNORMAL HIGH (ref 70–99)
Phosphorus: 5.9 mg/dL — ABNORMAL HIGH (ref 2.5–4.6)
Potassium: 3.2 mmol/L — ABNORMAL LOW (ref 3.5–5.1)
Sodium: 141 mmol/L (ref 135–145)

## 2018-11-15 LAB — IRON AND TIBC
Iron: 68 ug/dL (ref 28–170)
Saturation Ratios: 22 % (ref 10.4–31.8)
TIBC: 302 ug/dL (ref 250–450)
UIBC: 234 ug/dL

## 2018-11-15 LAB — POCT HEMOGLOBIN-HEMACUE: Hemoglobin: 12.1 g/dL (ref 12.0–15.0)

## 2018-11-15 LAB — FERRITIN: Ferritin: 159 ng/mL (ref 11–307)

## 2018-11-15 MED ORDER — EPOETIN ALFA 20000 UNIT/ML IJ SOLN
INTRAMUSCULAR | Status: AC
  Filled 2018-11-15: qty 1

## 2018-11-15 MED ORDER — EPOETIN ALFA 20000 UNIT/ML IJ SOLN: 20000.0000 [IU] | INTRAMUSCULAR | Status: DC

## 2018-11-16 LAB — PTH, INTACT AND CALCIUM
Calcium, Total (PTH): 9 mg/dL (ref 8.7–10.3)
PTH: 202 pg/mL — ABNORMAL HIGH (ref 15–65)

## 2018-11-19 ENCOUNTER — Other Ambulatory Visit: Payer: Self-pay | Admitting: Gastroenterology

## 2018-11-19 DIAGNOSIS — K862 Cyst of pancreas: Secondary | ICD-10-CM

## 2018-11-29 ENCOUNTER — Encounter (HOSPITAL_COMMUNITY): Payer: Medicare Other

## 2018-12-06 ENCOUNTER — Ambulatory Visit (HOSPITAL_COMMUNITY)
Admission: RE | Admit: 2018-12-06 | Discharge: 2018-12-06 | Disposition: A | Discharge status: Home / Self Care | Payer: Medicare Other | Source: Ambulatory Visit | Attending: Nephrology | Admitting: Nephrology

## 2018-12-06 ENCOUNTER — Other Ambulatory Visit: Payer: Self-pay

## 2018-12-06 VITALS — BP 134/60 | HR 54 | Temp 96.4°F | Resp 20

## 2018-12-06 DIAGNOSIS — N184 Chronic kidney disease, stage 4 (severe): Secondary | ICD-10-CM | POA: Diagnosis not present

## 2018-12-06 LAB — POCT HEMOGLOBIN-HEMACUE: Hemoglobin: 11.2 g/dL — ABNORMAL LOW (ref 12.0–15.0)

## 2018-12-06 MED ORDER — EPOETIN ALFA 20000 UNIT/ML IJ SOLN
INTRAMUSCULAR | Status: AC
  Administered 2018-12-06: 20000 [IU] via SUBCUTANEOUS
  Filled 2018-12-06: qty 1

## 2018-12-06 MED ORDER — EPOETIN ALFA 20000 UNIT/ML IJ SOLN
20000.0000 [IU] | INTRAMUSCULAR | Status: DC
  Administered 2018-12-06: 14:00:00 20000 [IU] via SUBCUTANEOUS

## 2018-12-20 ENCOUNTER — Encounter (HOSPITAL_COMMUNITY)
Admission: RE | Admit: 2018-12-20 | Discharge: 2018-12-20 | Disposition: A | Discharge status: Home / Self Care | Payer: Medicare Other | Source: Ambulatory Visit | Attending: Nephrology | Admitting: Nephrology

## 2018-12-20 ENCOUNTER — Other Ambulatory Visit: Payer: Self-pay

## 2018-12-20 VITALS — BP 145/62 | HR 66 | Temp 96.6°F | Resp 18

## 2018-12-20 DIAGNOSIS — N184 Chronic kidney disease, stage 4 (severe): Secondary | ICD-10-CM | POA: Diagnosis not present

## 2018-12-20 LAB — IRON AND TIBC
Iron: 61 ug/dL (ref 28–170)
Saturation Ratios: 21 % (ref 10.4–31.8)
TIBC: 295 ug/dL (ref 250–450)
UIBC: 234 ug/dL

## 2018-12-20 LAB — RENAL FUNCTION PANEL
Albumin: 3.8 g/dL (ref 3.5–5.0)
Anion gap: 13 (ref 5–15)
BUN: 78 mg/dL — ABNORMAL HIGH (ref 8–23)
CO2: 27 mmol/L (ref 22–32)
Calcium: 9.3 mg/dL (ref 8.9–10.3)
Chloride: 102 mmol/L (ref 98–111)
Creatinine, Ser: 4.01 mg/dL — ABNORMAL HIGH (ref 0.44–1.00)
GFR calc Af Amer: 11 mL/min — ABNORMAL LOW
GFR calc non Af Amer: 10 mL/min — ABNORMAL LOW
Glucose, Bld: 88 mg/dL (ref 70–99)
Phosphorus: 6.3 mg/dL — ABNORMAL HIGH (ref 2.5–4.6)
Potassium: 3.8 mmol/L (ref 3.5–5.1)
Sodium: 142 mmol/L (ref 135–145)

## 2018-12-20 LAB — FERRITIN: Ferritin: 219 ng/mL (ref 11–307)

## 2018-12-20 MED ORDER — EPOETIN ALFA 20000 UNIT/ML IJ SOLN
20000.0000 [IU] | INTRAMUSCULAR | Status: DC
  Administered 2018-12-20: 20000 [IU] via SUBCUTANEOUS

## 2018-12-20 MED ORDER — EPOETIN ALFA 20000 UNIT/ML IJ SOLN
INTRAMUSCULAR | Status: AC
  Filled 2018-12-20: qty 1

## 2018-12-21 LAB — PTH, INTACT AND CALCIUM
Calcium, Total (PTH): 9.4 mg/dL (ref 8.7–10.3)
PTH: 321 pg/mL — ABNORMAL HIGH (ref 15–65)

## 2018-12-21 LAB — POCT HEMOGLOBIN-HEMACUE: Hemoglobin: 11.8 g/dL — ABNORMAL LOW (ref 12.0–15.0)

## 2018-12-28 ENCOUNTER — Other Ambulatory Visit: Payer: Self-pay | Admitting: Gastroenterology

## 2018-12-28 DIAGNOSIS — R1310 Dysphagia, unspecified: Secondary | ICD-10-CM

## 2019-01-03 ENCOUNTER — Encounter (HOSPITAL_COMMUNITY): Payer: Medicare Other

## 2019-01-04 ENCOUNTER — Ambulatory Visit (HOSPITAL_COMMUNITY)
Admission: RE | Admit: 2019-01-04 | Discharge: 2019-01-04 | Disposition: A | Discharge status: Home / Self Care | Payer: Medicare Other | Source: Ambulatory Visit | Attending: Nephrology | Admitting: Nephrology

## 2019-01-04 ENCOUNTER — Other Ambulatory Visit: Payer: Self-pay

## 2019-01-04 VITALS — BP 121/52 | HR 50 | Temp 96.8°F | Resp 20

## 2019-01-04 DIAGNOSIS — N184 Chronic kidney disease, stage 4 (severe): Secondary | ICD-10-CM | POA: Diagnosis present

## 2019-01-04 LAB — POCT HEMOGLOBIN-HEMACUE: Hemoglobin: 10.8 g/dL — ABNORMAL LOW (ref 12.0–15.0)

## 2019-01-04 MED ORDER — EPOETIN ALFA 20000 UNIT/ML IJ SOLN
20000.0000 [IU] | INTRAMUSCULAR | Status: DC
  Administered 2019-01-04: 20000 [IU] via SUBCUTANEOUS

## 2019-01-04 MED ORDER — EPOETIN ALFA 20000 UNIT/ML IJ SOLN
INTRAMUSCULAR | Status: AC
  Filled 2019-01-04: qty 1

## 2019-01-07 NOTE — Progress Notes (Signed)
Cardiology Office Note   Date:  01/08/2019   ID:  Doris Howell, Doris Howell 1935-11-18, MRN [id number]  PCP:  Lavone Orn, MD  Cardiologist: Darlin Coco MD -- > now [name]  Chief Complaint  Patient presents with  . Congestive Heart Failure   Problem list 1. Moderate to severe pulmonary hypertension - with right heart failure 2. Chronic kidney disease 3. Chronic combined  systolic and diastolic congestive heart failure  Previous Notes from Dr. Mare Ferrari:  Doris Howell is a 83 y.o. female who presents for a scheduled follow-up visit.  Doris Howell is a 83 y.o. female who presents for follow-up after recent hospitalization in April 2016 for exacerbation of chronic diastolic heart failure. During an earlier admission in January 2016 the patient had a right heart catheterization on 04/15/14 and she was found to have a pulmonary artery pressure of 64/25 with a mean of 40. The right ventricular end-diastolic pressure was 15. The impression was biventricular heart failure with preserved left ventricular ejection fraction and moderate pulmonary artery hypertension. The patient responded well to diuresis. Since being discharged her breathing has improved. Her weight has been stable at home. She sleeps on 2 pillows and the head of her bed is elevated on bricks. She is not having any paroxysmal nocturnal dyspnea. The patient has severe kidney disease and left heart catheterization was avoided because of her renal insufficiency. The patient had an echocardiogram on 07/31/14 which showed an ejection fraction of 45-50% with grade 2 diastolic dysfunction and mild mitral regurgitation. The patient has a history of diabetes. She denies any hypoglycemic episodes. Since last visit she has been doing reasonably well. She had a Lexiscan Myoview on 05/05/14 which showed left ventricular systolic dysfunction with ejection fraction 44%. There was no ischemia. The patient has not  been having any chest pain. She has a history of sleep apnea.She now has a CPAP machine which is new since last visit The patient has not been having any symptoms of gout. She is not having any hypoglycemic episodes. She has been experiencing nocturia 2-3 times a night. She has been having some intermittent right upper quadrant discomfort which sounds like a muscular cramp.  It does not have the characteristics of gallbladder colic.   [name] 23, 2018:  Doris Howell is seen for the first time today - transfer from [facility] Has had more shortness of breath this past weekend.    Does not eat salt.  Breathing is better.   October 13, 2016:  Ms. [name] is seen today for follow up of her biventricular CHF. And a recent visit to the ER. She was in the ER Aug 31, 2016 with atypical CP . Troponin levels were mildly elevated.   0.07, 0.06 No further CP ,  No issues.  Creatinine is now 3.16.   Has had CKD for years.  BP is typically elevated at doctors office  No CP or dyspnea   [name] 14, 2020 Pt is seen today for follow up of an episode of CP Seen with niece, Doris Howell.   The pain was described as a hurting sensation.  It woke her up at 4 AM.  It lasted until 7.  He did out into her left arm.  It resolved spontaneously. She is had shortness of breath for a while.  She did not have any worsening shortness of breath during the episode. Was not pleuretic.  Pain was worse with lying down .   No viral illness,  Does not eat much salt.  Has noticed swelling in her lwegs,   Left > right   Has OSA but has not been wearing her CPAP.    Has progressive renal insufficiency  - creatinine is 3.73.   Sept. 29. 2020  Seen with niece, Doris Howell. Has CP usually when she lies down  Has chronic dyspnea HR has been slow .  Walks verylittle,  Not much exercise,  Has excessive DOE   Echo from [name] 2020 shows moderate left ventricular dysfunction.  Ejection fraction is 35 to 40%.  She has  grade 2 diastolic dysfunction.  The right ventricle is enlarged.  Estimated right ventricular pressure is 44 mmHg.  Has stage V CKD .   Last creatinine is 4 01    Past Medical History:  Diagnosis Date  . CAP (community acquired pneumonia) 01/09/2014   "first time I've ever had it"  . Diastolic congestive heart failure ([place])   . GERD (gastroesophageal reflux disease)   . High cholesterol   . History of gout   . Hypertension   . Kidney stones   . Type II diabetes mellitus with stage 4 chronic kidney disease (HCC)     Past Surgical History:  Procedure Laterality Date  . ABDOMINAL HYSTERECTOMY  ~ 1974  . CATARACT EXTRACTION W/ INTRAOCULAR LENS  IMPLANT, BILATERAL Bilateral 2014  . COLONOSCOPY WITH PROPOFOL N/A 10/23/2012   Procedure: COLONOSCOPY WITH PROPOFOL;  Surgeon: [name], MD;  Location: [facility] ENDOSCOPY;  Service: Endoscopy;  Laterality: N/A;  . KIDNEY STONE SURGERY  10-05-12   "cut me on the side"  . LITHOTRIPSY  1980's?  . RIGHT HEART CATHETERIZATION N/A 04/15/2014   Procedure: RIGHT HEART CATH;  Surgeon: Sanda Klein, MD;  Location: [facility];  Service: Cardiovascular;  Laterality: N/A;     Current Outpatient Medications  Medication Sig Dispense Refill  . albuterol (PROVENTIL HFA;VENTOLIN HFA) 108 (90 BASE) MCG/ACT inhaler Inhale 2 puffs into the lungs every 6 (six) hours as needed for wheezing or shortness of breath. 1 Inhaler 0  . aspirin EC 81 MG tablet Take 81 mg by mouth daily.    . carvedilol (COREG) 6.25 MG tablet Take 1 tablet (6.25 mg total) by mouth 2 (two) times daily with a meal. 180 tablet 3  . febuxostat (ULORIC) 40 MG tablet Take 1 tablet by mouth daily.    . hydrALAZINE (APRESOLINE) 25 MG tablet Take 25 mg by mouth 2 (two) times daily.     [name] latanoprost (XALATAN) 0.005 % ophthalmic solution Place 1 drop into both eyes at bedtime.    . nitroGLYCERIN (NITROSTAT) 0.4 MG SL tablet Place 1 tablet (0.4 mg total) under the tongue every 5 (five) minutes as  needed for chest pain. 25 tablet 6  . torsemide (DEMADEX) 20 MG tablet Take 1-2 tablets (20-40 mg total) by mouth 2 (two) times daily. Take 2 tabs daily at 8 AM and 1 tab daily after lunch at 1 PM. 90 tablet 0  . Vitamin D, Cholecalciferol, 400 UNITS CAPS Take 2 capsules by mouth daily.     No current facility-administered medications for this visit.     Allergies:   Other, Simvastatin, and Codeine    Social History:  The patient  reports that she has never smoked. She has never used smokeless tobacco. She reports that she does not drink alcohol or use drugs.   Family History:  The patient's family history includes Diabetes in her brother; Hypertension in her mother.  ROS:  Please see the history of present illness.   Otherwise, review of systems are positive for none.   All other systems are reviewed and negative.    Physical Exam: Blood pressure 100/66, pulse (!) 49, height 5\' 1"  (1.549 m), weight 149 lb (67.6 kg), SpO2 98 %.  GEN:   Elderly female ,  NAD  HEENT: Normal NECK: No JVD; No carotid bruits LYMPHATICS: No lymphadenopathy CARDIAC: RRR  With frequent premature beats  RESPIRATORY:   Clear  ABDOMEN: Soft, non-tender, non-distended MUSCULOSKELETAL:  1-2+ pitting edema in legs  SKIN: Warm and dry NEUROLOGIC:  Alert and oriented x 3    EKG:   April 24, 2018: Normal sinus rhythm at 71.  Frequent premature ventricular contractions in a trigeminal pattern.  Left bundle branch block.   Recent Labs: 06/18/2018: B Natriuretic Peptide 3,030.8; Magnesium 2.2 06/19/2018: ALT 17; TSH 2.250 06/20/2018: Platelets 150 12/20/2018: BUN 78; Creatinine, Ser 4.01; Potassium 3.8; Sodium 142 01/04/2019: Hemoglobin 10.8    Lipid Panel No results found for: CHOL, TRIG, HDL, CHOLHDL, VLDL, LDLCALC, LDLDIRECT    Wt Readings from Last 3 Encounters:  01/08/19 149 lb (67.6 kg)  06/25/18 154 lb 1.6 oz (69.9 kg)  05/09/18 166 lb (75.3 kg)        ASSESSMENT AND PLAN:  1.  Chest  discomfort:  Very atypical .   Is not a candidate for cath because of her renal insufficiency and her refusal to discuss dialysis.   Fortunately, it does not sound like angina  1.  Biventricular congestive heart failure, improved. Heart failure symptoms seem to be improved.  2. Renal insufficiency stage 5: She will follow-up with Dr. Posey Pronto.  3. Anemia of chronic disease  4.  LBBB :    Stable ,   Unchanged.   Current medicines are reviewed at length with the patient today.  The patient does not have concerns regarding medicines.  The following changes have been made:  no change  Labs/ tests ordered today include:   No orders of the defined types were placed in this encounter.

## 2019-01-08 ENCOUNTER — Other Ambulatory Visit: Payer: Self-pay

## 2019-01-08 ENCOUNTER — Ambulatory Visit (INDEPENDENT_AMBULATORY_CARE_PROVIDER_SITE_OTHER): Payer: Medicare Other | Admitting: Cardiovascular Disease

## 2019-01-08 ENCOUNTER — Encounter: Payer: Self-pay | Admitting: Cardiovascular Disease

## 2019-01-08 ENCOUNTER — Other Ambulatory Visit: Payer: Medicare Other

## 2019-01-08 VITALS — BP 100/66 | HR 49 | Ht 61.0 in | Wt 149.0 lb

## 2019-01-08 DIAGNOSIS — I5032 Chronic diastolic (congestive) heart failure: Secondary | ICD-10-CM

## 2019-01-08 DIAGNOSIS — I447 Left bundle-branch block, unspecified: Secondary | ICD-10-CM | POA: Diagnosis not present

## 2019-01-08 DIAGNOSIS — I10 Essential (primary) hypertension: Secondary | ICD-10-CM

## 2019-01-08 MED ORDER — CARVEDILOL 6.25 MG PO TABS: 6.2500 mg | ORAL_TABLET | Freq: Two times a day (BID) | ORAL | 3 refills | Status: DC

## 2019-01-08 NOTE — Patient Instructions (Signed)
Medication Instructions:  1) DECREASE CARVEDILOL to 6.25 mg twice daily If you need a refill on your cardiac medications before your next appointment, please call your pharmacy.    Follow-Up: At [facility], you and your health needs are our priority.  As part of our continuing mission to provide you with exceptional heart care, we have created designated Provider Care Teams.  These Care Teams include your primary Cardiologist (physician) and Advanced Practice Providers (APPs -  Physician Assistants and Nurse Practitioners) who all work together to provide you with the care you need, when you need it. You will need a follow up appointment in:  6 months.  Please call our office 2 months in advance to schedule this appointment.  You may see Mertie Moores, MD or one of the following Advanced Practice Providers on your designated Care Team: Richardson Dopp, PA-C [place], [place] . Daune Perch, NP

## 2019-01-17 ENCOUNTER — Other Ambulatory Visit: Payer: Self-pay

## 2019-01-17 ENCOUNTER — Ambulatory Visit (HOSPITAL_COMMUNITY)
Admission: RE | Admit: 2019-01-17 | Discharge: 2019-01-17 | Disposition: A | Discharge status: Home / Self Care | Payer: Medicare Other | Source: Ambulatory Visit | Attending: Nephrology | Admitting: Nephrology

## 2019-01-17 VITALS — BP 112/66 | HR 60 | Resp 18

## 2019-01-17 DIAGNOSIS — N184 Chronic kidney disease, stage 4 (severe): Secondary | ICD-10-CM | POA: Insufficient documentation

## 2019-01-17 LAB — FERRITIN: Ferritin: 176 ng/mL (ref 11–307)

## 2019-01-17 LAB — RENAL FUNCTION PANEL
Albumin: 3.6 g/dL (ref 3.5–5.0)
Anion gap: 14 (ref 5–15)
BUN: 79 mg/dL — ABNORMAL HIGH (ref 8–23)
CO2: 24 mmol/L (ref 22–32)
Calcium: 8.9 mg/dL (ref 8.9–10.3)
Chloride: 105 mmol/L (ref 98–111)
Creatinine, Ser: 4.08 mg/dL — ABNORMAL HIGH (ref 0.44–1.00)
GFR calc Af Amer: 11 mL/min — ABNORMAL LOW (ref >60)
GFR calc non Af Amer: 10 mL/min — ABNORMAL LOW (ref >60)
Glucose, Bld: 100 mg/dL — ABNORMAL HIGH (ref 70–99)
Phosphorus: 5.4 mg/dL — ABNORMAL HIGH (ref 2.5–4.6)
Potassium: 3.3 mmol/L — ABNORMAL LOW (ref 3.5–5.1)
Sodium: 143 mmol/L (ref 135–145)

## 2019-01-17 LAB — IRON AND TIBC
Iron: 57 ug/dL (ref 28–170)
Saturation Ratios: 20 % (ref 10.4–31.8)
TIBC: 279 ug/dL (ref 250–450)
UIBC: 222 ug/dL

## 2019-01-17 LAB — POCT HEMOGLOBIN-HEMACUE: Hemoglobin: 11.3 g/dL — ABNORMAL LOW (ref 12.0–15.0)

## 2019-01-17 MED ORDER — EPOETIN ALFA 20000 UNIT/ML IJ SOLN
INTRAMUSCULAR | Status: AC
  Administered 2019-01-17: 20000 [IU] via SUBCUTANEOUS
  Filled 2019-01-17: qty 1

## 2019-01-17 MED ORDER — EPOETIN ALFA 20000 UNIT/ML IJ SOLN
20000.0000 [IU] | INTRAMUSCULAR | Status: DC
  Administered 2019-01-17: 15:00:00 20000 [IU] via SUBCUTANEOUS

## 2019-01-18 LAB — PTH, INTACT AND CALCIUM
Calcium, Total (PTH): 8.8 mg/dL (ref 8.7–10.3)
PTH: 228 pg/mL — ABNORMAL HIGH (ref 15–65)

## 2019-01-31 ENCOUNTER — Ambulatory Visit (HOSPITAL_COMMUNITY)
Admission: RE | Admit: 2019-01-31 | Discharge: 2019-01-31 | Disposition: A | Discharge status: Home / Self Care | Payer: Medicare Other | Source: Ambulatory Visit | Attending: Nephrology | Admitting: Nephrology

## 2019-01-31 VITALS — BP 143/74 | HR 61 | Temp 96.7°F | Resp 18

## 2019-01-31 DIAGNOSIS — N184 Chronic kidney disease, stage 4 (severe): Secondary | ICD-10-CM | POA: Insufficient documentation

## 2019-01-31 LAB — POCT HEMOGLOBIN-HEMACUE: Hemoglobin: 12 g/dL (ref 12.0–15.0)

## 2019-01-31 MED ORDER — EPOETIN ALFA 20000 UNIT/ML IJ SOLN: 20000.0000 [IU] | INTRAMUSCULAR | Status: DC

## 2019-02-14 ENCOUNTER — Other Ambulatory Visit: Payer: Self-pay

## 2019-02-14 ENCOUNTER — Encounter (HOSPITAL_COMMUNITY)
Admission: RE | Admit: 2019-02-14 | Discharge: 2019-02-14 | Disposition: A | Discharge status: Home / Self Care | Payer: Medicare Other | Source: Ambulatory Visit | Attending: Nephrology | Admitting: Nephrology

## 2019-02-14 VITALS — BP 156/60 | HR 42 | Temp 96.9°F | Resp 20

## 2019-02-14 DIAGNOSIS — N184 Chronic kidney disease, stage 4 (severe): Secondary | ICD-10-CM | POA: Diagnosis not present

## 2019-02-14 LAB — IRON AND TIBC
Iron: 85 ug/dL (ref 28–170)
Saturation Ratios: 28 % (ref 10.4–31.8)
TIBC: 300 ug/dL (ref 250–450)
UIBC: 215 ug/dL

## 2019-02-14 LAB — RENAL FUNCTION PANEL
Albumin: 3.9 g/dL (ref 3.5–5.0)
Anion gap: 12 (ref 5–15)
BUN: 98 mg/dL — ABNORMAL HIGH (ref 8–23)
CO2: 24 mmol/L (ref 22–32)
Calcium: 9 mg/dL (ref 8.9–10.3)
Chloride: 104 mmol/L (ref 98–111)
Creatinine, Ser: 4.15 mg/dL — ABNORMAL HIGH (ref 0.44–1.00)
GFR calc Af Amer: 11 mL/min — ABNORMAL LOW (ref >60)
GFR calc non Af Amer: 9 mL/min — ABNORMAL LOW (ref >60)
Glucose, Bld: 100 mg/dL — ABNORMAL HIGH (ref 70–99)
Phosphorus: 6.1 mg/dL — ABNORMAL HIGH (ref 2.5–4.6)
Potassium: 3.4 mmol/L — ABNORMAL LOW (ref 3.5–5.1)
Sodium: 140 mmol/L (ref 135–145)

## 2019-02-14 LAB — FERRITIN: Ferritin: 247 ng/mL (ref 11–307)

## 2019-02-14 LAB — POCT HEMOGLOBIN-HEMACUE: Hemoglobin: 11.2 g/dL — ABNORMAL LOW (ref 12.0–15.0)

## 2019-02-14 MED ORDER — EPOETIN ALFA 20000 UNIT/ML IJ SOLN
INTRAMUSCULAR | Status: AC
  Filled 2019-02-14: qty 1

## 2019-02-14 MED ORDER — EPOETIN ALFA 20000 UNIT/ML IJ SOLN
20000.0000 [IU] | INTRAMUSCULAR | Status: DC
  Administered 2019-02-14: 14:00:00 20000 [IU] via SUBCUTANEOUS

## 2019-02-15 ENCOUNTER — Ambulatory Visit
Admission: RE | Admit: 2019-02-15 | Discharge: 2019-02-15 | Disposition: A | Discharge status: Home / Self Care | Payer: Medicare Other | Source: Ambulatory Visit | Attending: Gastroenterology | Admitting: Gastroenterology

## 2019-02-15 DIAGNOSIS — R1310 Dysphagia, unspecified: Secondary | ICD-10-CM

## 2019-02-15 LAB — PTH, INTACT AND CALCIUM
Calcium, Total (PTH): 9.3 mg/dL (ref 8.7–10.3)
PTH: 328 pg/mL — ABNORMAL HIGH (ref 15–65)

## 2019-02-28 ENCOUNTER — Other Ambulatory Visit: Payer: Self-pay

## 2019-02-28 ENCOUNTER — Encounter (HOSPITAL_COMMUNITY)
Admission: RE | Admit: 2019-02-28 | Discharge: 2019-02-28 | Disposition: A | Discharge status: Home / Self Care | Payer: Medicare Other | Source: Ambulatory Visit | Attending: Nephrology | Admitting: Nephrology

## 2019-02-28 VITALS — BP 138/69 | HR 58 | Temp 96.3°F | Resp 20

## 2019-02-28 DIAGNOSIS — N184 Chronic kidney disease, stage 4 (severe): Secondary | ICD-10-CM | POA: Diagnosis not present

## 2019-02-28 LAB — POCT HEMOGLOBIN-HEMACUE: Hemoglobin: 11.5 g/dL — ABNORMAL LOW (ref 12.0–15.0)

## 2019-02-28 MED ORDER — EPOETIN ALFA 20000 UNIT/ML IJ SOLN
INTRAMUSCULAR | Status: AC
  Administered 2019-02-28: 20000 [IU] via SUBCUTANEOUS
  Filled 2019-02-28: qty 1

## 2019-02-28 MED ORDER — EPOETIN ALFA 20000 UNIT/ML IJ SOLN
20000.0000 [IU] | INTRAMUSCULAR | Status: DC
  Administered 2019-02-28: 15:00:00 20000 [IU] via SUBCUTANEOUS

## 2019-03-14 ENCOUNTER — Ambulatory Visit (HOSPITAL_COMMUNITY)
Admission: RE | Admit: 2019-03-14 | Discharge: 2019-03-14 | Disposition: A | Discharge status: Home / Self Care | Payer: Medicare Other | Source: Ambulatory Visit | Attending: Nephrology | Admitting: Nephrology

## 2019-03-14 ENCOUNTER — Other Ambulatory Visit: Payer: Self-pay

## 2019-03-14 VITALS — BP 135/67 | HR 59 | Temp 97.0°F | Resp 18

## 2019-03-14 DIAGNOSIS — N184 Chronic kidney disease, stage 4 (severe): Secondary | ICD-10-CM | POA: Insufficient documentation

## 2019-03-14 LAB — RENAL FUNCTION PANEL
Albumin: 3.9 g/dL (ref 3.5–5.0)
Anion gap: 14 (ref 5–15)
BUN: 89 mg/dL — ABNORMAL HIGH (ref 8–23)
CO2: 25 mmol/L (ref 22–32)
Calcium: 9.2 mg/dL (ref 8.9–10.3)
Chloride: 105 mmol/L (ref 98–111)
Creatinine, Ser: 3.89 mg/dL — ABNORMAL HIGH (ref 0.44–1.00)
GFR calc Af Amer: 12 mL/min — ABNORMAL LOW (ref >60)
GFR calc non Af Amer: 10 mL/min — ABNORMAL LOW (ref >60)
Glucose, Bld: 100 mg/dL — ABNORMAL HIGH (ref 70–99)
Phosphorus: 6.3 mg/dL — ABNORMAL HIGH (ref 2.5–4.6)
Potassium: 3.4 mmol/L — ABNORMAL LOW (ref 3.5–5.1)
Sodium: 144 mmol/L (ref 135–145)

## 2019-03-14 LAB — IRON AND TIBC
Iron: 61 ug/dL (ref 28–170)
Saturation Ratios: 20 % (ref 10.4–31.8)
TIBC: 309 ug/dL (ref 250–450)
UIBC: 248 ug/dL

## 2019-03-14 LAB — FERRITIN: Ferritin: 184 ng/mL (ref 11–307)

## 2019-03-14 MED ORDER — EPOETIN ALFA 20000 UNIT/ML IJ SOLN
INTRAMUSCULAR | Status: AC
  Filled 2019-03-14: qty 1

## 2019-03-14 MED ORDER — EPOETIN ALFA 20000 UNIT/ML IJ SOLN
20000.0000 [IU] | INTRAMUSCULAR | Status: DC
  Administered 2019-03-14: 20000 [IU] via SUBCUTANEOUS

## 2019-03-15 LAB — PTH, INTACT AND CALCIUM
Calcium, Total (PTH): 9.2 mg/dL (ref 8.7–10.3)
PTH: 323 pg/mL — ABNORMAL HIGH (ref 15–65)

## 2019-03-15 LAB — POCT HEMOGLOBIN-HEMACUE: Hemoglobin: 11.8 g/dL — ABNORMAL LOW (ref 12.0–15.0)

## 2019-03-28 ENCOUNTER — Other Ambulatory Visit: Payer: Self-pay

## 2019-03-28 ENCOUNTER — Ambulatory Visit (HOSPITAL_COMMUNITY)
Admission: RE | Admit: 2019-03-28 | Discharge: 2019-03-28 | Disposition: A | Discharge status: Home / Self Care | Payer: Medicare Other | Source: Ambulatory Visit | Attending: Nephrology | Admitting: Nephrology

## 2019-03-28 VITALS — BP 129/55 | HR 40 | Temp 96.7°F | Resp 20

## 2019-03-28 DIAGNOSIS — N184 Chronic kidney disease, stage 4 (severe): Secondary | ICD-10-CM | POA: Diagnosis present

## 2019-03-28 LAB — POCT HEMOGLOBIN-HEMACUE: Hemoglobin: 12.7 g/dL (ref 12.0–15.0)

## 2019-03-28 MED ORDER — EPOETIN ALFA 20000 UNIT/ML IJ SOLN: 20000.0000 [IU] | INTRAMUSCULAR | Status: DC

## 2019-04-11 ENCOUNTER — Encounter (HOSPITAL_COMMUNITY): Payer: Medicare Other

## 2019-04-15 ENCOUNTER — Encounter (HOSPITAL_COMMUNITY)
Admission: RE | Admit: 2019-04-15 | Discharge: 2019-04-15 | Disposition: A | Discharge status: Home / Self Care | Payer: Medicare Other | Source: Ambulatory Visit | Attending: Nephrology | Admitting: Nephrology

## 2019-04-15 ENCOUNTER — Other Ambulatory Visit: Payer: Self-pay

## 2019-04-15 VITALS — BP 113/58 | HR 77 | Temp 96.1°F | Resp 20

## 2019-04-15 DIAGNOSIS — N184 Chronic kidney disease, stage 4 (severe): Secondary | ICD-10-CM | POA: Diagnosis not present

## 2019-04-15 LAB — FERRITIN: Ferritin: 226 ng/mL (ref 11–307)

## 2019-04-15 LAB — RENAL FUNCTION PANEL
Albumin: 3.9 g/dL (ref 3.5–5.0)
Anion gap: 11 (ref 5–15)
BUN: 85 mg/dL — ABNORMAL HIGH (ref 8–23)
CO2: 26 mmol/L (ref 22–32)
Calcium: 9.2 mg/dL (ref 8.9–10.3)
Chloride: 106 mmol/L (ref 98–111)
Creatinine, Ser: 3.84 mg/dL — ABNORMAL HIGH (ref 0.44–1.00)
GFR calc Af Amer: 12 mL/min — ABNORMAL LOW (ref >60)
GFR calc non Af Amer: 10 mL/min — ABNORMAL LOW (ref >60)
Glucose, Bld: 106 mg/dL — ABNORMAL HIGH (ref 70–99)
Phosphorus: 5.6 mg/dL — ABNORMAL HIGH (ref 2.5–4.6)
Potassium: 3.4 mmol/L — ABNORMAL LOW (ref 3.5–5.1)
Sodium: 143 mmol/L (ref 135–145)

## 2019-04-15 LAB — IRON AND TIBC
Iron: 85 ug/dL (ref 28–170)
Saturation Ratios: 28 % (ref 10.4–31.8)
TIBC: 307 ug/dL (ref 250–450)
UIBC: 222 ug/dL

## 2019-04-15 LAB — POCT HEMOGLOBIN-HEMACUE: Hemoglobin: 11.5 g/dL — ABNORMAL LOW (ref 12.0–15.0)

## 2019-04-15 MED ORDER — EPOETIN ALFA 20000 UNIT/ML IJ SOLN: 20000.0000 [IU] | INTRAMUSCULAR | Status: DC

## 2019-04-15 MED ORDER — EPOETIN ALFA 20000 UNIT/ML IJ SOLN
INTRAMUSCULAR | Status: AC
  Administered 2019-04-15: 20000 [IU] via SUBCUTANEOUS
  Filled 2019-04-15: qty 1

## 2019-04-16 LAB — PTH, INTACT AND CALCIUM
Calcium, Total (PTH): 8.9 mg/dL (ref 8.7–10.3)
PTH: 339 pg/mL — ABNORMAL HIGH (ref 15–65)

## 2019-04-29 ENCOUNTER — Other Ambulatory Visit: Payer: Self-pay

## 2019-04-29 ENCOUNTER — Ambulatory Visit (HOSPITAL_COMMUNITY)
Admission: RE | Admit: 2019-04-29 | Discharge: 2019-04-29 | Disposition: A | Discharge status: Home / Self Care | Payer: Medicare Other | Source: Ambulatory Visit | Attending: Nephrology | Admitting: Nephrology

## 2019-04-29 VITALS — BP 144/61 | HR 70 | Resp 20

## 2019-04-29 DIAGNOSIS — N184 Chronic kidney disease, stage 4 (severe): Secondary | ICD-10-CM | POA: Diagnosis present

## 2019-04-29 LAB — POCT HEMOGLOBIN-HEMACUE: Hemoglobin: 11.7 g/dL — ABNORMAL LOW (ref 12.0–15.0)

## 2019-04-29 MED ORDER — EPOETIN ALFA 20000 UNIT/ML IJ SOLN: 20000.0000 [IU] | INTRAMUSCULAR | Status: DC

## 2019-04-29 MED ORDER — EPOETIN ALFA 20000 UNIT/ML IJ SOLN
INTRAMUSCULAR | Status: AC
  Administered 2019-04-29: 20000 [IU] via SUBCUTANEOUS
  Filled 2019-04-29: qty 1

## 2019-05-02 ENCOUNTER — Other Ambulatory Visit: Payer: Self-pay

## 2019-05-02 ENCOUNTER — Ambulatory Visit
Admission: RE | Admit: 2019-05-02 | Discharge: 2019-05-02 | Disposition: A | Discharge status: Home / Self Care | Payer: Medicare Other | Source: Ambulatory Visit | Attending: Gastroenterology | Admitting: Gastroenterology

## 2019-05-02 DIAGNOSIS — K862 Cyst of pancreas: Secondary | ICD-10-CM

## 2019-05-10 ENCOUNTER — Ambulatory Visit: Payer: Medicare Other

## 2019-05-13 ENCOUNTER — Encounter (HOSPITAL_COMMUNITY): Payer: Medicare Other

## 2019-05-16 ENCOUNTER — Ambulatory Visit: Payer: Medicare Other

## 2019-05-16 ENCOUNTER — Ambulatory Visit
Admission: RE | Admit: 2019-05-16 | Discharge: 2019-05-16 | Disposition: A | Discharge status: Home / Self Care | Payer: Medicare Other | Source: Ambulatory Visit | Attending: Nephrology | Admitting: Nephrology

## 2019-05-16 ENCOUNTER — Other Ambulatory Visit: Payer: Self-pay

## 2019-05-16 VITALS — BP 137/66 | HR 56 | Temp 95.8°F | Resp 20

## 2019-05-16 DIAGNOSIS — N184 Chronic kidney disease, stage 4 (severe): Secondary | ICD-10-CM | POA: Insufficient documentation

## 2019-05-16 DIAGNOSIS — Z23 Encounter for immunization: Secondary | ICD-10-CM

## 2019-05-16 LAB — IRON AND TIBC
Iron: 76 ug/dL (ref 28–170)
Saturation Ratios: 24 % (ref 10.4–31.8)
TIBC: 319 ug/dL (ref 250–450)
UIBC: 243 ug/dL

## 2019-05-16 LAB — RENAL FUNCTION PANEL
Albumin: 3.8 g/dL (ref 3.5–5.0)
Anion gap: 17 — ABNORMAL HIGH (ref 5–15)
BUN: 97 mg/dL — ABNORMAL HIGH (ref 8–23)
CO2: 20 mmol/L — ABNORMAL LOW (ref 22–32)
Calcium: 8.9 mg/dL (ref 8.9–10.3)
Chloride: 107 mmol/L (ref 98–111)
Creatinine, Ser: 3.89 mg/dL — ABNORMAL HIGH (ref 0.44–1.00)
GFR calc Af Amer: 12 mL/min — ABNORMAL LOW (ref >60)
GFR calc non Af Amer: 10 mL/min — ABNORMAL LOW (ref >60)
Glucose, Bld: 129 mg/dL — ABNORMAL HIGH (ref 70–99)
Phosphorus: 6.2 mg/dL — ABNORMAL HIGH (ref 2.5–4.6)
Potassium: 3.7 mmol/L (ref 3.5–5.1)
Sodium: 144 mmol/L (ref 135–145)

## 2019-05-16 LAB — POCT HEMOGLOBIN-HEMACUE: Hemoglobin: 11.2 g/dL — ABNORMAL LOW (ref 12.0–15.0)

## 2019-05-16 LAB — FERRITIN: Ferritin: 126 ng/mL (ref 11–307)

## 2019-05-16 MED ORDER — EPOETIN ALFA 20000 UNIT/ML IJ SOLN
INTRAMUSCULAR | Status: AC
  Filled 2019-05-16: qty 1

## 2019-05-16 MED ORDER — EPOETIN ALFA 20000 UNIT/ML IJ SOLN
20000.0000 [IU] | INTRAMUSCULAR | Status: DC
  Administered 2019-05-16: 20000 [IU] via SUBCUTANEOUS

## 2019-05-16 NOTE — Progress Notes (Signed)
   Covid-19 Vaccination Clinic  Name:  Doris Howell    MRN: [id number] DOB: [birth year]  05/16/2019  Doris Howell was observed post Covid-19 immunization for 15 minutes without incidence. She was provided with Vaccine Information Sheet and instruction to access the V-Safe system.   Doris Howell was instructed to call 911 with any severe reactions post vaccine: [name] Difficulty breathing  . Swelling of your face and throat  . A fast heartbeat  . A bad rash all over your body  . Dizziness and weakness    Immunizations Administered    Name Date Dose VIS Date Route   Pfizer COVID-19 Vaccine 05/16/2019  9:01 AM 0.3 mL 03/22/2019 Intramuscular   Manufacturer: [place]   Lot: [id number]   [place]: [id number]

## 2019-05-17 LAB — PTH, INTACT AND CALCIUM
Calcium, Total (PTH): UNDETERMINED mg/dL
PTH: 231 pg/mL — ABNORMAL HIGH (ref 15–65)

## 2019-05-21 ENCOUNTER — Ambulatory Visit: Payer: Medicare Other

## 2019-05-30 ENCOUNTER — Encounter (HOSPITAL_COMMUNITY): Payer: Medicare Other

## 2019-06-04 ENCOUNTER — Other Ambulatory Visit: Payer: Self-pay

## 2019-06-04 ENCOUNTER — Ambulatory Visit (HOSPITAL_COMMUNITY)
Admission: RE | Admit: 2019-06-04 | Discharge: 2019-06-04 | Disposition: A | Discharge status: Home / Self Care | Payer: Medicare Other | Source: Ambulatory Visit | Attending: Nephrology | Admitting: Nephrology

## 2019-06-04 VITALS — BP 151/58 | HR 60 | Temp 95.5°F | Resp 20

## 2019-06-04 DIAGNOSIS — N184 Chronic kidney disease, stage 4 (severe): Secondary | ICD-10-CM | POA: Diagnosis present

## 2019-06-04 LAB — POCT HEMOGLOBIN-HEMACUE: Hemoglobin: 11.2 g/dL — ABNORMAL LOW (ref 12.0–15.0)

## 2019-06-04 MED ORDER — EPOETIN ALFA 20000 UNIT/ML IJ SOLN
20000.0000 [IU] | INTRAMUSCULAR | Status: DC
  Administered 2019-06-04: 20000 [IU] via SUBCUTANEOUS

## 2019-06-04 MED ORDER — EPOETIN ALFA 20000 UNIT/ML IJ SOLN
INTRAMUSCULAR | Status: AC
  Filled 2019-06-04: qty 1

## 2019-06-10 ENCOUNTER — Ambulatory Visit: Payer: Medicare Other | Attending: Internal Medicine

## 2019-06-10 DIAGNOSIS — Z23 Encounter for immunization: Secondary | ICD-10-CM | POA: Insufficient documentation

## 2019-06-10 NOTE — Progress Notes (Signed)
   Covid-19 Vaccination Clinic  Name:  Doris Howell    MRN: [id number] DOB: [birth year]  06/10/2019  [name] was observed post Covid-19 immunization for 15 minutes without incidence. She was provided with Vaccine Information Sheet and instruction to access the V-Safe system.   [name] was instructed to call 911 with any severe reactions post vaccine: [name] Difficulty breathing  . Swelling of your face and throat  . A fast heartbeat  . A bad rash all over your body  . Dizziness and weakness    Immunizations Administered    Name Date Dose VIS Date Route   Pfizer COVID-19 Vaccine 06/10/2019  1:41 PM 0.3 mL 03/22/2019 Intramuscular   Manufacturer: [place]   Lot: [id number]   [place]: [id number]

## 2019-06-18 ENCOUNTER — Encounter (HOSPITAL_COMMUNITY): Payer: Medicare Other

## 2019-06-20 ENCOUNTER — Encounter (HOSPITAL_COMMUNITY)
Admission: RE | Admit: 2019-06-20 | Discharge: 2019-06-20 | Disposition: A | Discharge status: Home / Self Care | Payer: Medicare Other | Source: Ambulatory Visit | Attending: Nephrology | Admitting: Nephrology

## 2019-06-20 ENCOUNTER — Other Ambulatory Visit: Payer: Self-pay

## 2019-06-20 VITALS — BP 145/56 | HR 61 | Temp 96.4°F | Resp 20

## 2019-06-20 DIAGNOSIS — N184 Chronic kidney disease, stage 4 (severe): Secondary | ICD-10-CM | POA: Diagnosis not present

## 2019-06-20 LAB — RENAL FUNCTION PANEL
Albumin: 3.5 g/dL (ref 3.5–5.0)
Anion gap: 15 (ref 5–15)
BUN: 84 mg/dL — ABNORMAL HIGH (ref 8–23)
CO2: 23 mmol/L (ref 22–32)
Calcium: 8.5 mg/dL — ABNORMAL LOW (ref 8.9–10.3)
Chloride: 106 mmol/L (ref 98–111)
Creatinine, Ser: 3.81 mg/dL — ABNORMAL HIGH (ref 0.44–1.00)
GFR calc Af Amer: 12 mL/min — ABNORMAL LOW (ref >60)
GFR calc non Af Amer: 10 mL/min — ABNORMAL LOW (ref >60)
Glucose, Bld: 93 mg/dL (ref 70–99)
Phosphorus: 6.2 mg/dL — ABNORMAL HIGH (ref 2.5–4.6)
Potassium: 3.5 mmol/L (ref 3.5–5.1)
Sodium: 144 mmol/L (ref 135–145)

## 2019-06-20 LAB — POCT HEMOGLOBIN-HEMACUE: Hemoglobin: 11.1 g/dL — ABNORMAL LOW (ref 12.0–15.0)

## 2019-06-20 LAB — FERRITIN: Ferritin: 125 ng/mL (ref 11–307)

## 2019-06-20 LAB — IRON AND TIBC
Iron: 47 ug/dL (ref 28–170)
Saturation Ratios: 15 % (ref 10.4–31.8)
TIBC: 321 ug/dL (ref 250–450)
UIBC: 274 ug/dL

## 2019-06-20 MED ORDER — EPOETIN ALFA 20000 UNIT/ML IJ SOLN
INTRAMUSCULAR | Status: AC
  Filled 2019-06-20: qty 1

## 2019-06-20 MED ORDER — EPOETIN ALFA 20000 UNIT/ML IJ SOLN
20000.0000 [IU] | INTRAMUSCULAR | Status: DC
  Administered 2019-06-20: 20000 [IU] via SUBCUTANEOUS

## 2019-06-21 LAB — PTH, INTACT AND CALCIUM
Calcium, Total (PTH): 8.7 mg/dL (ref 8.7–10.3)
PTH: 261 pg/mL — ABNORMAL HIGH (ref 15–65)

## 2019-07-03 ENCOUNTER — Ambulatory Visit (INDEPENDENT_AMBULATORY_CARE_PROVIDER_SITE_OTHER): Payer: Medicare Other | Admitting: Cardiovascular Disease

## 2019-07-03 ENCOUNTER — Other Ambulatory Visit: Payer: Self-pay

## 2019-07-03 ENCOUNTER — Encounter: Payer: Self-pay | Admitting: Cardiovascular Disease

## 2019-07-03 VITALS — BP 126/64 | HR 50 | Ht 61.0 in | Wt 148.5 lb

## 2019-07-03 DIAGNOSIS — I10 Essential (primary) hypertension: Secondary | ICD-10-CM | POA: Diagnosis not present

## 2019-07-03 DIAGNOSIS — I447 Left bundle-branch block, unspecified: Secondary | ICD-10-CM | POA: Diagnosis not present

## 2019-07-03 DIAGNOSIS — I5032 Chronic diastolic (congestive) heart failure: Secondary | ICD-10-CM | POA: Diagnosis not present

## 2019-07-03 NOTE — Patient Instructions (Addendum)
Medication Instructions:  No changes  *If you need a refill on your cardiac medications before your next appointment, please call your pharmacy*   Lab Work: none If you have labs (blood work) drawn today and your tests are completely normal, you will receive your results only by: [name] MyChart Message (if you have MyChart) OR . A paper copy in the mail If you have any lab test that is abnormal or we need to change your treatment, we will call you to review the results.   Testing/Procedures: none   Follow-Up: At [facility], you and your health needs are our priority.  As part of our continuing mission to provide you with exceptional heart care, we have created designated Provider Care Teams.  These Care Teams include your primary Cardiologist (physician) and Advanced Practice Providers (APPs -  Physician Assistants and Nurse Practitioners) who all work together to provide you with the care you need, when you need it.  We recommend signing up for the patient portal called "MyChart".  Sign up information is provided on this After Visit Summary.  MyChart is used to connect with patients for Virtual Visits (Telemedicine).  Patients are able to view lab/test results, encounter notes, upcoming appointments, etc.  Non-urgent messages can be sent to your provider as well.   To learn more about what you can do with MyChart, go to [email or web address].    Your next appointment:   6 month(s)  The format for your next appointment:   In Person  Provider:   You may see or one of the following Advanced Practice Providers on your designated Care Team:    Richardson Dopp, PA-C  Robbie Lis, [place]     Other Instructions

## 2019-07-03 NOTE — Progress Notes (Signed)
Cardiology Office Note   Date:  07/03/2019   ID:  Talani, Brazee 09-01-1935, MRN [id number]  PCP:  Lavone Orn, MD  Cardiologist: Darlin Coco MD -- > now Shavar Gorka  No chief complaint on file.  Problem list 1. Moderate to severe pulmonary hypertension - with right heart failure 2. Chronic kidney disease 3. Chronic combined  systolic and diastolic congestive heart failure  Previous Notes from Dr. Mare Ferrari:  Doris Howell is a 84 y.o. female who presents for a scheduled follow-up visit.  Doris Howell is a 84 y.o. female who presents for follow-up after recent hospitalization in April 2016 for exacerbation of chronic diastolic heart failure. During an earlier admission in January 2016 the patient had a right heart catheterization on 04/15/14 and she was found to have a pulmonary artery pressure of 64/25 with a mean of 40. The right ventricular end-diastolic pressure was 15. The impression was biventricular heart failure with preserved left ventricular ejection fraction and moderate pulmonary artery hypertension. The patient responded well to diuresis. Since being discharged her breathing has improved. Her weight has been stable at home. She sleeps on 2 pillows and the head of her bed is elevated on bricks. She is not having any paroxysmal nocturnal dyspnea. The patient has severe kidney disease and left heart catheterization was avoided because of her renal insufficiency. The patient had an echocardiogram on 07/31/14 which showed an ejection fraction of 45-50% with grade 2 diastolic dysfunction and mild mitral regurgitation. The patient has a history of diabetes. She denies any hypoglycemic episodes. Since last visit she has been doing reasonably well. She had a Lexiscan Myoview on 05/05/14 which showed left ventricular systolic dysfunction with ejection fraction 44%. There was no ischemia. The patient has not been having any chest pain. She has a  history of sleep apnea.She now has a CPAP machine which is new since last visit The patient has not been having any symptoms of gout. She is not having any hypoglycemic episodes. She has been experiencing nocturia 2-3 times a night. She has been having some intermittent right upper quadrant discomfort which sounds like a muscular cramp.  It does not have the characteristics of gallbladder colic.   [name] 23, 2018:  Doris Howell is seen for the first time today - transfer from [place] Has had more shortness of breath this past weekend.    Does not eat salt.  Breathing is better.   October 13, 2016:  Doris Howell is seen today for follow up of her biventricular CHF. And a recent visit to the ER. She was in the ER Aug 31, 2016 with atypical CP . Troponin levels were mildly elevated.   0.07, 0.06 No further CP ,  No issues.  Creatinine is now 3.16.   Has had CKD for years.  BP is typically elevated at doctors office  No CP or dyspnea   Doris Howell, Doris Howell Pt is seen today for follow up of an episode of CP Seen with niece, Doris Howell.   The pain was described as a hurting sensation.  It woke her up at 4 AM.  It lasted until 7.  He did out into her left arm.  It resolved spontaneously. She is had shortness of breath for a while.  She did not have any worsening shortness of breath during the episode. Was not pleuretic.  Pain was worse with lying down .   No viral illness,    Does not eat much salt.  Has noticed swelling in her lwegs,   Left > right   Has OSA but has not been wearing her CPAP.    Has progressive renal insufficiency  - creatinine is 3.73.   Sept. 29. Doris Howell  Seen with niece, Doris Howell. Has CP usually when she lies down  Has chronic dyspnea HR has been slow .  Walks verylittle,  Not much exercise,  Has excessive DOE   Echo from [name] Doris Howell shows moderate left ventricular dysfunction.  Ejection fraction is 35 to 40%.  She has grade 2 diastolic dysfunction.  The right  ventricle is enlarged.  Estimated right ventricular pressure is 44 mmHg.  Has stage V CKD .   Last creatinine is 4 01   July 03, 2019:  Doris Howell is seen today for follow-up visit.   Seen with Niece, Doris Howell.  I saw her in September.  She is had some apical atypical episodes of chest discomfort. Last creatinine is 3.81  She gets epogen every 2 weeks   Past Medical History:  Diagnosis Date  . CAP (community acquired pneumonia) 01/09/2014   "first time I've ever had it"  . Diastolic congestive heart failure ([place])   . GERD (gastroesophageal reflux disease)   . High cholesterol   . History of gout   . Hypertension   . Kidney stones   . Type II diabetes mellitus with stage 4 chronic kidney disease (HCC)     Past Surgical History:  Procedure Laterality Date  . ABDOMINAL HYSTERECTOMY  ~ 1974  . CATARACT EXTRACTION W/ INTRAOCULAR LENS  IMPLANT, BILATERAL Bilateral 2014  . COLONOSCOPY WITH PROPOFOL N/A 10/23/2012   Procedure: COLONOSCOPY WITH PROPOFOL;  Surgeon: [name], MD;  Location: [facility] ENDOSCOPY;  Service: Endoscopy;  Laterality: N/A;  . KIDNEY STONE SURGERY  6-27-Howell   "cut me on the side"  . LITHOTRIPSY  1980's?  . RIGHT HEART CATHETERIZATION N/A 04/15/2014   Procedure: RIGHT HEART CATH;  Surgeon: Sanda Klein, MD;  Location: [facility] CATH LAB;  Service: Cardiovascular;  Laterality: N/A;     Current Outpatient Medications  Medication Sig Dispense Refill  . albuterol (PROVENTIL HFA;VENTOLIN HFA) 108 (90 BASE) MCG/ACT inhaler Inhale 2 puffs into the lungs every 6 (six) hours as needed for wheezing or shortness of breath. 1 Inhaler 0  . allopurinol (ZYLOPRIM) 100 MG tablet Take 100 mg by mouth daily.    [name] aspirin EC 81 MG tablet Take 81 mg by mouth daily.    . carvedilol (COREG) 6.25 MG tablet Take 6.25 mg by mouth daily. 1/2 tablet in the am and 1/2 tablet in the pm    . hydrALAZINE (APRESOLINE) 25 MG tablet Take 25 mg by mouth 2 (two) times daily.     [name]  latanoprost (XALATAN) 0.005 % ophthalmic solution Place 1 drop into both eyes at bedtime.    . nitroGLYCERIN (NITROSTAT) 0.4 MG SL tablet Place 1 tablet (0.4 mg total) under the tongue every 5 (five) minutes as needed for chest pain. 25 tablet 6  . torsemide (DEMADEX) 20 MG tablet Take 1-2 tablets (20-40 mg total) by mouth 2 (two) times daily. Take 2 tabs daily at 8 AM and 1 tab daily after lunch at 1 PM. 90 tablet 0  . Vitamin D, Cholecalciferol, 400 UNITS CAPS Take 2 capsules by mouth daily.     No current facility-administered medications for this visit.    Allergies:   Other, Simvastatin, and Codeine    Social History:  The patient  reports that she has never smoked. She has never used smokeless tobacco. She reports that she does not drink alcohol or use drugs.   Family History:  The patient's family history includes Diabetes in her brother; Hypertension in her mother.    ROS:  Please see the history of present illness.   Otherwise, review of systems are positive for none.   All other systems are reviewed and negative.    Physical Exam: Blood pressure 126/64, pulse (!) 50, height 5\' 1"  (1.549 m), weight 148 lb 8 oz (67.4 kg), SpO2 93 %.  GEN: Elderly female, no acute distress HEENT: Normal NECK: No JVD; No carotid bruits LYMPHATICS: No lymphadenopathy CARDIAC: RRR , no murmurs, rubs, gallops RESPIRATORY:  Clear to auscultation without rales, wheezing or rhonchi  ABDOMEN: Soft, non-tender, non-distended MUSCULOSKELETAL:  No edema; No deformity  SKIN: Warm and dry NEUROLOGIC:  Alert and oriented x 3    EKG:    NSR ,  Frequent PVCs , left bundle branch block at baseline.  Recent Labs: 06/20/2019: BUN 84; Creatinine, Ser 3.81; Hemoglobin 11.1; Potassium 3.5; Sodium 144    Lipid Panel No results found for: CHOL, TRIG, HDL, CHOLHDL, VLDL, LDLCALC, LDLDIRECT    Wt Readings from Last 3 Encounters:  07/03/19 148 lb 8 oz (67.4 kg)  01/08/19 149 lb (67.6 kg)  06/25/18 154 lb  1.6 oz (69.9 kg)        ASSESSMENT AND PLAN:  1.  Chest discomfort:   - no further episodes of chest discomfort.  I suspect this was a noncardiac issue.  She is a very poor candidate for heart catheterization.  1.  Biventricular congestive heart failure, improved.  She is not having the severe symptoms.  Continue current medications.  2. Renal insufficiency stage 5: Managed by her nephrologist.  3. Anemia of chronic disease  : She gets her hemoglobin checked on a regular basis and gets Epogen shots regularly if her hemoglobin falls.  4.  LBBB :     .   Current medicines are reviewed at length with the patient today.  The patient does not have concerns regarding medicines.  The following changes have been made:  no change  Labs/ tests ordered today include:   Orders Placed This Encounter  Procedures  . EKG 12-Lead

## 2019-07-04 ENCOUNTER — Encounter (HOSPITAL_COMMUNITY)
Admission: RE | Admit: 2019-07-04 | Discharge: 2019-07-04 | Disposition: A | Discharge status: Home / Self Care | Payer: Medicare Other | Source: Ambulatory Visit | Attending: Nephrology | Admitting: Nephrology

## 2019-07-04 VITALS — BP 134/64 | HR 51 | Temp 96.5°F | Resp 20

## 2019-07-04 DIAGNOSIS — N184 Chronic kidney disease, stage 4 (severe): Secondary | ICD-10-CM

## 2019-07-04 MED ORDER — EPOETIN ALFA 20000 UNIT/ML IJ SOLN
INTRAMUSCULAR | Status: AC
  Administered 2019-07-04: 20000 [IU] via SUBCUTANEOUS
  Filled 2019-07-04: qty 1

## 2019-07-04 MED ORDER — EPOETIN ALFA 20000 UNIT/ML IJ SOLN: 20000.0000 [IU] | INTRAMUSCULAR | Status: DC

## 2019-07-05 LAB — POCT HEMOGLOBIN-HEMACUE: Hemoglobin: 10.9 g/dL — ABNORMAL LOW (ref 12.0–15.0)

## 2019-07-09 ENCOUNTER — Telehealth: Payer: Self-pay | Admitting: Cardiovascular Disease

## 2019-07-09 NOTE — Telephone Encounter (Signed)
STAT if HR is under 50 or over 120 (normal HR is 60-100 beats per minute)  1) What is your heart rate? Dropped to 35 today, has come back up to the 60's  2) Do you have a log of your heart rate readings (document readings)? 40's, 35, 63, 64, 65  3) Do you have any other symptoms? seems more SOB today, BP 160/60 a bit elevated for her.   Otila Kluver from [facility] states she is currently with the patient whose HR dropped to 35. She states it is now 52, but she seems more SOB today. She also states her BP is 160/60 which is a bit elevated for her.

## 2019-07-09 NOTE — Telephone Encounter (Signed)
Prospero the home health agency is a benefit of her insurance and available to see her more often or at any time if needed. Right now she sees the patient once every 4 weeks.   The nurse felt that today she was different than last visit.  She seemed weak, not as clear/sharp mentally, relied on her husband to answer some of the questions, seemed off but no confusion.  Little more edema today.    SOB on exertion walking through the house. 35-40s for HR at beginning of visit.  Afterward came up and stayed in the 54s.  Adv that EKG last week was noted to have frequent PVCs.  She continues carvedilol 3.125 mg twice a day.  She just wanted to make sure someone was aware of her assessment.  She knows pt was seen here by Dr. Acie Fredrickson on 3/24.   Adv if there are any recommendations we will call back.

## 2019-07-09 NOTE — Telephone Encounter (Signed)
I suspect the recorded HR of 40s was just sinus rhythm with PVCs so it was undercounted Thanks She should call her primary MD about "not feeling quite right"

## 2019-07-18 ENCOUNTER — Other Ambulatory Visit: Payer: Self-pay

## 2019-07-18 ENCOUNTER — Encounter (HOSPITAL_COMMUNITY)
Admission: RE | Admit: 2019-07-18 | Discharge: 2019-07-18 | Disposition: A | Discharge status: Home / Self Care | Payer: Medicare Other | Source: Ambulatory Visit | Attending: Nephrology | Admitting: Nephrology

## 2019-07-18 VITALS — BP 135/61 | HR 59 | Temp 96.1°F | Resp 20

## 2019-07-18 DIAGNOSIS — N184 Chronic kidney disease, stage 4 (severe): Secondary | ICD-10-CM | POA: Insufficient documentation

## 2019-07-18 LAB — RENAL FUNCTION PANEL
Albumin: 3.7 g/dL (ref 3.5–5.0)
Anion gap: 13 (ref 5–15)
BUN: 87 mg/dL — ABNORMAL HIGH (ref 8–23)
CO2: 25 mmol/L (ref 22–32)
Calcium: 8.9 mg/dL (ref 8.9–10.3)
Chloride: 106 mmol/L (ref 98–111)
Creatinine, Ser: 3.81 mg/dL — ABNORMAL HIGH (ref 0.44–1.00)
GFR calc Af Amer: 12 mL/min — ABNORMAL LOW (ref >60)
GFR calc non Af Amer: 10 mL/min — ABNORMAL LOW (ref >60)
Glucose, Bld: 107 mg/dL — ABNORMAL HIGH (ref 70–99)
Phosphorus: 6.1 mg/dL — ABNORMAL HIGH (ref 2.5–4.6)
Potassium: 3.3 mmol/L — ABNORMAL LOW (ref 3.5–5.1)
Sodium: 144 mmol/L (ref 135–145)

## 2019-07-18 LAB — IRON AND TIBC
Iron: 57 ug/dL (ref 28–170)
Saturation Ratios: 17 % (ref 10.4–31.8)
TIBC: 336 ug/dL (ref 250–450)
UIBC: 279 ug/dL

## 2019-07-18 LAB — POCT HEMOGLOBIN-HEMACUE: Hemoglobin: 11 g/dL — ABNORMAL LOW (ref 12.0–15.0)

## 2019-07-18 LAB — FERRITIN: Ferritin: 85 ng/mL (ref 11–307)

## 2019-07-18 MED ORDER — EPOETIN ALFA 20000 UNIT/ML IJ SOLN
INTRAMUSCULAR | Status: AC
  Administered 2019-07-18: 15:00:00 20000 [IU] via SUBCUTANEOUS
  Filled 2019-07-18: qty 1

## 2019-07-18 MED ORDER — EPOETIN ALFA 20000 UNIT/ML IJ SOLN: 20000.0000 [IU] | INTRAMUSCULAR | Status: DC

## 2019-07-19 LAB — PTH, INTACT AND CALCIUM
Calcium, Total (PTH): 8.9 mg/dL (ref 8.7–10.3)
PTH: 265 pg/mL — ABNORMAL HIGH (ref 15–65)

## 2019-08-01 ENCOUNTER — Other Ambulatory Visit: Payer: Self-pay

## 2019-08-01 ENCOUNTER — Ambulatory Visit (HOSPITAL_COMMUNITY)
Admission: RE | Admit: 2019-08-01 | Discharge: 2019-08-01 | Disposition: A | Discharge status: Home / Self Care | Payer: Medicare Other | Source: Ambulatory Visit | Attending: Nephrology | Admitting: Nephrology

## 2019-08-01 VITALS — BP 142/62 | HR 61 | Temp 96.7°F | Resp 20

## 2019-08-01 DIAGNOSIS — N184 Chronic kidney disease, stage 4 (severe): Secondary | ICD-10-CM | POA: Insufficient documentation

## 2019-08-01 LAB — POCT HEMOGLOBIN-HEMACUE: Hemoglobin: 11.9 g/dL — ABNORMAL LOW (ref 12.0–15.0)

## 2019-08-01 MED ORDER — EPOETIN ALFA 20000 UNIT/ML IJ SOLN: 20000.0000 [IU] | INTRAMUSCULAR | Status: DC

## 2019-08-01 MED ORDER — EPOETIN ALFA 20000 UNIT/ML IJ SOLN
INTRAMUSCULAR | Status: AC
  Administered 2019-08-01: 20000 [IU]
  Filled 2019-08-01: qty 1

## 2019-08-15 ENCOUNTER — Ambulatory Visit (HOSPITAL_COMMUNITY)
Admission: RE | Admit: 2019-08-15 | Discharge: 2019-08-15 | Disposition: A | Discharge status: Home / Self Care | Payer: Medicare Other | Source: Ambulatory Visit | Attending: Nephrology | Admitting: Nephrology

## 2019-08-15 ENCOUNTER — Other Ambulatory Visit: Payer: Self-pay

## 2019-08-15 VITALS — BP 173/76 | HR 71 | Temp 95.6°F | Resp 20

## 2019-08-15 DIAGNOSIS — N184 Chronic kidney disease, stage 4 (severe): Secondary | ICD-10-CM | POA: Insufficient documentation

## 2019-08-15 LAB — RENAL FUNCTION PANEL
Albumin: 3.8 g/dL (ref 3.5–5.0)
Anion gap: 15 (ref 5–15)
BUN: 84 mg/dL — ABNORMAL HIGH (ref 8–23)
CO2: 24 mmol/L (ref 22–32)
Calcium: 9.3 mg/dL (ref 8.9–10.3)
Chloride: 103 mmol/L (ref 98–111)
Creatinine, Ser: 4.03 mg/dL — ABNORMAL HIGH (ref 0.44–1.00)
GFR calc Af Amer: 11 mL/min — ABNORMAL LOW (ref >60)
GFR calc non Af Amer: 10 mL/min — ABNORMAL LOW (ref >60)
Glucose, Bld: 92 mg/dL (ref 70–99)
Phosphorus: 5.5 mg/dL — ABNORMAL HIGH (ref 2.5–4.6)
Potassium: 3.6 mmol/L (ref 3.5–5.1)
Sodium: 142 mmol/L (ref 135–145)

## 2019-08-15 LAB — IRON AND TIBC
Iron: 68 ug/dL (ref 28–170)
Saturation Ratios: 19 % (ref 10.4–31.8)
TIBC: 358 ug/dL (ref 250–450)
UIBC: 290 ug/dL

## 2019-08-15 LAB — FERRITIN: Ferritin: 78 ng/mL (ref 11–307)

## 2019-08-15 LAB — POCT HEMOGLOBIN-HEMACUE: Hemoglobin: 11 g/dL — ABNORMAL LOW (ref 12.0–15.0)

## 2019-08-15 MED ORDER — EPOETIN ALFA 20000 UNIT/ML IJ SOLN
INTRAMUSCULAR | Status: AC
  Administered 2019-08-15: 20000 [IU] via SUBCUTANEOUS
  Filled 2019-08-15: qty 1

## 2019-08-15 MED ORDER — EPOETIN ALFA 20000 UNIT/ML IJ SOLN: 20000.0000 [IU] | INTRAMUSCULAR | Status: DC

## 2019-08-16 LAB — PTH, INTACT AND CALCIUM
Calcium, Total (PTH): 9.2 mg/dL (ref 8.7–10.3)
PTH: 333 pg/mL — ABNORMAL HIGH (ref 15–65)

## 2019-08-29 ENCOUNTER — Ambulatory Visit (HOSPITAL_COMMUNITY)
Admission: RE | Admit: 2019-08-29 | Discharge: 2019-08-29 | Disposition: A | Discharge status: Home / Self Care | Payer: Medicare Other | Source: Ambulatory Visit | Attending: Nephrology | Admitting: Nephrology

## 2019-08-29 ENCOUNTER — Other Ambulatory Visit: Payer: Self-pay

## 2019-08-29 VITALS — BP 129/68 | HR 44 | Temp 98.6°F | Resp 20

## 2019-08-29 DIAGNOSIS — N184 Chronic kidney disease, stage 4 (severe): Secondary | ICD-10-CM | POA: Diagnosis not present

## 2019-08-29 LAB — POCT HEMOGLOBIN-HEMACUE: Hemoglobin: 12.5 g/dL (ref 12.0–15.0)

## 2019-08-29 MED ORDER — EPOETIN ALFA 20000 UNIT/ML IJ SOLN: 20000.0000 [IU] | INTRAMUSCULAR | Status: DC

## 2019-08-29 MED ORDER — SODIUM CHLORIDE 0.9 % IV SOLN
510.0000 mg | INTRAVENOUS | Status: DC
  Administered 2019-08-29: 510 mg via INTRAVENOUS
  Filled 2019-08-29: qty 17

## 2019-09-12 ENCOUNTER — Other Ambulatory Visit: Payer: Self-pay

## 2019-09-12 ENCOUNTER — Encounter (HOSPITAL_COMMUNITY)
Admission: RE | Admit: 2019-09-12 | Discharge: 2019-09-12 | Disposition: A | Discharge status: Home / Self Care | Payer: Medicare Other | Source: Ambulatory Visit | Attending: Nephrology | Admitting: Nephrology

## 2019-09-12 VITALS — BP 148/52 | HR 99 | Temp 96.3°F | Resp 20

## 2019-09-12 DIAGNOSIS — N184 Chronic kidney disease, stage 4 (severe): Secondary | ICD-10-CM | POA: Diagnosis not present

## 2019-09-12 LAB — POCT HEMOGLOBIN-HEMACUE: Hemoglobin: 11.6 g/dL — ABNORMAL LOW (ref 12.0–15.0)

## 2019-09-12 MED ORDER — EPOETIN ALFA 20000 UNIT/ML IJ SOLN: 20000.0000 [IU] | INTRAMUSCULAR | Status: DC

## 2019-09-12 MED ORDER — SODIUM CHLORIDE 0.9 % IV SOLN
510.0000 mg | INTRAVENOUS | Status: DC
  Administered 2019-09-12: 510 mg via INTRAVENOUS
  Filled 2019-09-12: qty 17

## 2019-09-12 MED ORDER — EPOETIN ALFA 20000 UNIT/ML IJ SOLN
INTRAMUSCULAR | Status: AC
  Administered 2019-09-12: 20000 [IU] via SUBCUTANEOUS
  Filled 2019-09-12: qty 1

## 2019-09-26 ENCOUNTER — Encounter (HOSPITAL_COMMUNITY)
Admission: RE | Admit: 2019-09-26 | Discharge: 2019-09-26 | Disposition: A | Discharge status: Home / Self Care | Payer: Medicare Other | Source: Ambulatory Visit | Attending: Nephrology | Admitting: Nephrology

## 2019-09-26 ENCOUNTER — Other Ambulatory Visit: Payer: Self-pay

## 2019-09-26 VITALS — BP 158/59 | HR 40 | Temp 96.5°F | Resp 20

## 2019-09-26 DIAGNOSIS — N184 Chronic kidney disease, stage 4 (severe): Secondary | ICD-10-CM

## 2019-09-26 LAB — RENAL FUNCTION PANEL
Albumin: 3.6 g/dL (ref 3.5–5.0)
Anion gap: 14 (ref 5–15)
BUN: 78 mg/dL — ABNORMAL HIGH (ref 8–23)
CO2: 24 mmol/L (ref 22–32)
Calcium: 8.9 mg/dL (ref 8.9–10.3)
Chloride: 106 mmol/L (ref 98–111)
Creatinine, Ser: 3.78 mg/dL — ABNORMAL HIGH (ref 0.44–1.00)
GFR calc Af Amer: 12 mL/min — ABNORMAL LOW (ref >60)
GFR calc non Af Amer: 11 mL/min — ABNORMAL LOW (ref >60)
Glucose, Bld: 103 mg/dL — ABNORMAL HIGH (ref 70–99)
Phosphorus: 5.3 mg/dL — ABNORMAL HIGH (ref 2.5–4.6)
Potassium: 3.5 mmol/L (ref 3.5–5.1)
Sodium: 144 mmol/L (ref 135–145)

## 2019-09-26 LAB — IRON AND TIBC
Iron: 86 ug/dL (ref 28–170)
Saturation Ratios: 30 % (ref 10.4–31.8)
TIBC: 286 ug/dL (ref 250–450)
UIBC: 200 ug/dL

## 2019-09-26 LAB — FERRITIN: Ferritin: 375 ng/mL — ABNORMAL HIGH (ref 11–307)

## 2019-09-26 LAB — POCT HEMOGLOBIN-HEMACUE: Hemoglobin: 11.9 g/dL — ABNORMAL LOW (ref 12.0–15.0)

## 2019-09-26 MED ORDER — EPOETIN ALFA 20000 UNIT/ML IJ SOLN
INTRAMUSCULAR | Status: AC
  Filled 2019-09-26: qty 1

## 2019-09-26 MED ORDER — EPOETIN ALFA 20000 UNIT/ML IJ SOLN
20000.0000 [IU] | INTRAMUSCULAR | Status: DC
  Administered 2019-09-26: 20000 [IU] via SUBCUTANEOUS

## 2019-09-27 LAB — PTH, INTACT AND CALCIUM
Calcium, Total (PTH): 8.8 mg/dL (ref 8.7–10.3)
PTH: 244 pg/mL — ABNORMAL HIGH (ref 15–65)

## 2019-10-10 ENCOUNTER — Other Ambulatory Visit: Payer: Self-pay

## 2019-10-10 ENCOUNTER — Encounter (HOSPITAL_COMMUNITY)
Admission: RE | Admit: 2019-10-10 | Discharge: 2019-10-10 | Disposition: A | Discharge status: Home / Self Care | Payer: Medicare Other | Source: Ambulatory Visit | Attending: Nephrology | Admitting: Nephrology

## 2019-10-10 VITALS — BP 146/77 | HR 59 | Temp 97.3°F | Resp 18

## 2019-10-10 DIAGNOSIS — N184 Chronic kidney disease, stage 4 (severe): Secondary | ICD-10-CM | POA: Insufficient documentation

## 2019-10-10 LAB — POCT HEMOGLOBIN-HEMACUE: Hemoglobin: 12.5 g/dL (ref 12.0–15.0)

## 2019-10-10 MED ORDER — EPOETIN ALFA 20000 UNIT/ML IJ SOLN: 20000.0000 [IU] | INTRAMUSCULAR | Status: DC

## 2019-10-10 MED ORDER — EPOETIN ALFA 20000 UNIT/ML IJ SOLN
INTRAMUSCULAR | Status: AC
  Filled 2019-10-10: qty 1

## 2019-10-24 ENCOUNTER — Other Ambulatory Visit: Payer: Self-pay

## 2019-10-24 ENCOUNTER — Encounter (HOSPITAL_COMMUNITY)
Admission: RE | Admit: 2019-10-24 | Discharge: 2019-10-24 | Disposition: A | Discharge status: Home / Self Care | Payer: Medicare Other | Source: Ambulatory Visit | Attending: Nephrology | Admitting: Nephrology

## 2019-10-24 VITALS — BP 150/73 | HR 70 | Temp 97.2°F | Resp 20

## 2019-10-24 DIAGNOSIS — N184 Chronic kidney disease, stage 4 (severe): Secondary | ICD-10-CM

## 2019-10-24 LAB — RENAL FUNCTION PANEL
Albumin: 3.7 g/dL (ref 3.5–5.0)
Anion gap: 13 (ref 5–15)
BUN: 75 mg/dL — ABNORMAL HIGH (ref 8–23)
CO2: 25 mmol/L (ref 22–32)
Calcium: 9 mg/dL (ref 8.9–10.3)
Chloride: 106 mmol/L (ref 98–111)
Creatinine, Ser: 4.07 mg/dL — ABNORMAL HIGH (ref 0.44–1.00)
GFR calc Af Amer: 11 mL/min — ABNORMAL LOW (ref >60)
GFR calc non Af Amer: 10 mL/min — ABNORMAL LOW (ref >60)
Glucose, Bld: 91 mg/dL (ref 70–99)
Phosphorus: 5.3 mg/dL — ABNORMAL HIGH (ref 2.5–4.6)
Potassium: 3.2 mmol/L — ABNORMAL LOW (ref 3.5–5.1)
Sodium: 144 mmol/L (ref 135–145)

## 2019-10-24 LAB — IRON AND TIBC
Iron: 83 ug/dL (ref 28–170)
Saturation Ratios: 28 % (ref 10.4–31.8)
TIBC: 295 ug/dL (ref 250–450)
UIBC: 212 ug/dL

## 2019-10-24 LAB — FERRITIN: Ferritin: 353 ng/mL — ABNORMAL HIGH (ref 11–307)

## 2019-10-24 MED ORDER — EPOETIN ALFA 20000 UNIT/ML IJ SOLN: 20000.0000 [IU] | INTRAMUSCULAR | Status: DC

## 2019-10-25 LAB — PTH, INTACT AND CALCIUM
Calcium, Total (PTH): 9 mg/dL (ref 8.7–10.3)
PTH: 385 pg/mL — ABNORMAL HIGH (ref 15–65)

## 2019-10-25 LAB — POCT HEMOGLOBIN-HEMACUE: Hemoglobin: 12.2 g/dL (ref 12.0–15.0)

## 2019-11-07 ENCOUNTER — Other Ambulatory Visit: Payer: Self-pay

## 2019-11-07 ENCOUNTER — Encounter (HOSPITAL_COMMUNITY)
Admission: RE | Admit: 2019-11-07 | Discharge: 2019-11-07 | Disposition: A | Discharge status: Home / Self Care | Payer: Medicare Other | Source: Ambulatory Visit | Attending: Nephrology | Admitting: Nephrology

## 2019-11-07 VITALS — BP 140/72 | HR 65 | Temp 97.3°F | Resp 20

## 2019-11-07 DIAGNOSIS — N184 Chronic kidney disease, stage 4 (severe): Secondary | ICD-10-CM

## 2019-11-07 LAB — POCT HEMOGLOBIN-HEMACUE: Hemoglobin: 11.1 g/dL — ABNORMAL LOW (ref 12.0–15.0)

## 2019-11-07 MED ORDER — EPOETIN ALFA 20000 UNIT/ML IJ SOLN
INTRAMUSCULAR | Status: AC
  Administered 2019-11-07: 20000 [IU] via SUBCUTANEOUS
  Filled 2019-11-07: qty 1

## 2019-11-07 MED ORDER — EPOETIN ALFA 20000 UNIT/ML IJ SOLN: 20000.0000 [IU] | INTRAMUSCULAR | Status: DC

## 2019-11-21 ENCOUNTER — Other Ambulatory Visit: Payer: Self-pay

## 2019-11-21 ENCOUNTER — Ambulatory Visit (HOSPITAL_COMMUNITY)
Admission: RE | Admit: 2019-11-21 | Discharge: 2019-11-21 | Disposition: A | Discharge status: Home / Self Care | Payer: Medicare Other | Source: Ambulatory Visit | Attending: Nephrology | Admitting: Nephrology

## 2019-11-21 VITALS — BP 154/77 | HR 108 | Temp 97.3°F | Resp 20

## 2019-11-21 DIAGNOSIS — N184 Chronic kidney disease, stage 4 (severe): Secondary | ICD-10-CM | POA: Diagnosis present

## 2019-11-21 LAB — RENAL FUNCTION PANEL
Albumin: 3.7 g/dL (ref 3.5–5.0)
Anion gap: 15 (ref 5–15)
BUN: 86 mg/dL — ABNORMAL HIGH (ref 8–23)
CO2: 23 mmol/L (ref 22–32)
Calcium: 9 mg/dL (ref 8.9–10.3)
Chloride: 104 mmol/L (ref 98–111)
Creatinine, Ser: 4.47 mg/dL — ABNORMAL HIGH (ref 0.44–1.00)
GFR calc Af Amer: 10 mL/min — ABNORMAL LOW (ref >60)
GFR calc non Af Amer: 9 mL/min — ABNORMAL LOW (ref >60)
Glucose, Bld: 98 mg/dL (ref 70–99)
Phosphorus: 5.6 mg/dL — ABNORMAL HIGH (ref 2.5–4.6)
Potassium: 3.5 mmol/L (ref 3.5–5.1)
Sodium: 142 mmol/L (ref 135–145)

## 2019-11-21 LAB — IRON AND TIBC
Iron: 78 ug/dL (ref 28–170)
Saturation Ratios: 25 % (ref 10.4–31.8)
TIBC: 307 ug/dL (ref 250–450)
UIBC: 229 ug/dL

## 2019-11-21 LAB — POCT HEMOGLOBIN-HEMACUE: Hemoglobin: 11.5 g/dL — ABNORMAL LOW (ref 12.0–15.0)

## 2019-11-21 LAB — FERRITIN: Ferritin: 338 ng/mL — ABNORMAL HIGH (ref 11–307)

## 2019-11-21 MED ORDER — EPOETIN ALFA 20000 UNIT/ML IJ SOLN
INTRAMUSCULAR | Status: AC
  Administered 2019-11-21: 20000 [IU] via SUBCUTANEOUS
  Filled 2019-11-21: qty 1

## 2019-11-21 MED ORDER — EPOETIN ALFA 20000 UNIT/ML IJ SOLN: 20000.0000 [IU] | INTRAMUSCULAR | Status: DC

## 2019-11-22 LAB — PTH, INTACT AND CALCIUM
Calcium, Total (PTH): 9.2 mg/dL (ref 8.7–10.3)
PTH: 235 pg/mL — ABNORMAL HIGH (ref 15–65)

## 2019-12-05 ENCOUNTER — Other Ambulatory Visit: Payer: Self-pay

## 2019-12-05 ENCOUNTER — Encounter (HOSPITAL_COMMUNITY)
Admission: RE | Admit: 2019-12-05 | Discharge: 2019-12-05 | Disposition: A | Discharge status: Home / Self Care | Payer: Medicare Other | Source: Ambulatory Visit | Attending: Nephrology | Admitting: Nephrology

## 2019-12-05 VITALS — BP 128/49 | HR 90

## 2019-12-05 DIAGNOSIS — N184 Chronic kidney disease, stage 4 (severe): Secondary | ICD-10-CM | POA: Insufficient documentation

## 2019-12-05 LAB — POCT HEMOGLOBIN-HEMACUE: Hemoglobin: 11.4 g/dL — ABNORMAL LOW (ref 12.0–15.0)

## 2019-12-05 MED ORDER — EPOETIN ALFA 20000 UNIT/ML IJ SOLN: 20000.0000 [IU] | INTRAMUSCULAR | Status: DC

## 2019-12-05 MED ORDER — EPOETIN ALFA 20000 UNIT/ML IJ SOLN
INTRAMUSCULAR | Status: AC
  Administered 2019-12-05: 20000 [IU] via SUBCUTANEOUS
  Filled 2019-12-05: qty 1

## 2019-12-19 ENCOUNTER — Ambulatory Visit (HOSPITAL_COMMUNITY)
Admission: RE | Admit: 2019-12-19 | Discharge: 2019-12-19 | Disposition: A | Discharge status: Home / Self Care | Payer: Medicare Other | Source: Ambulatory Visit | Attending: Internal Medicine | Admitting: Internal Medicine

## 2019-12-19 ENCOUNTER — Other Ambulatory Visit: Payer: Self-pay

## 2019-12-19 VITALS — BP 143/68 | HR 105 | Temp 97.7°F | Resp 20

## 2019-12-19 DIAGNOSIS — D631 Anemia in chronic kidney disease: Secondary | ICD-10-CM | POA: Diagnosis not present

## 2019-12-19 DIAGNOSIS — N184 Chronic kidney disease, stage 4 (severe): Secondary | ICD-10-CM | POA: Diagnosis not present

## 2019-12-19 LAB — RENAL FUNCTION PANEL
Albumin: 3.5 g/dL (ref 3.5–5.0)
Anion gap: 13 (ref 5–15)
BUN: 83 mg/dL — ABNORMAL HIGH (ref 8–23)
CO2: 23 mmol/L (ref 22–32)
Calcium: 9.1 mg/dL (ref 8.9–10.3)
Chloride: 107 mmol/L (ref 98–111)
Creatinine, Ser: 4.14 mg/dL — ABNORMAL HIGH (ref 0.44–1.00)
GFR calc Af Amer: 11 mL/min — ABNORMAL LOW (ref >60)
GFR calc non Af Amer: 9 mL/min — ABNORMAL LOW (ref >60)
Glucose, Bld: 88 mg/dL (ref 70–99)
Phosphorus: 5.7 mg/dL — ABNORMAL HIGH (ref 2.5–4.6)
Potassium: 3.5 mmol/L (ref 3.5–5.1)
Sodium: 143 mmol/L (ref 135–145)

## 2019-12-19 LAB — IRON AND TIBC
Iron: 81 ug/dL (ref 28–170)
Saturation Ratios: 28 % (ref 10.4–31.8)
TIBC: 294 ug/dL (ref 250–450)
UIBC: 213 ug/dL

## 2019-12-19 LAB — FERRITIN: Ferritin: 221 ng/mL (ref 11–307)

## 2019-12-19 LAB — POCT HEMOGLOBIN-HEMACUE: Hemoglobin: 11.4 g/dL — ABNORMAL LOW (ref 12.0–15.0)

## 2019-12-19 MED ORDER — EPOETIN ALFA 20000 UNIT/ML IJ SOLN
INTRAMUSCULAR | Status: AC
  Filled 2019-12-19: qty 1

## 2019-12-19 MED ORDER — EPOETIN ALFA 20000 UNIT/ML IJ SOLN
20000.0000 [IU] | INTRAMUSCULAR | Status: DC
  Administered 2019-12-19: 20000 [IU] via SUBCUTANEOUS

## 2019-12-20 LAB — PTH, INTACT AND CALCIUM
Calcium, Total (PTH): 9 mg/dL (ref 8.7–10.3)
PTH: 192 pg/mL — ABNORMAL HIGH (ref 15–65)

## 2019-12-30 NOTE — Progress Notes (Signed)
Cardiology Office Note:    Date:  12/31/2019   ID:  Doris Howell, DOB 1935/11/15, MRN [id number]  PCP:  Lavone Orn, MD  [facility] Cardiologist:  Mertie Moores, MD   [facility] Electrophysiologist:  None   Referring MD: Lavone Orn, MD   Chief Complaint:  Follow-up (CHF)    Patient Profile:    Doris Howell is a 84 y.o. female with:   Biventricular CHF  Pulmonary hypertension   Cardiomyopathy  Echocardiogram 04/2018: EF 35-40  Myoview 04/2018: no ischemia   Cardiac catheterization avoided due to chronic kidney disease   Chronic kidney disease  LBBB  Anemia of chronic disease   OSA, on CPAP  Hypertension   Diabetes mellitus   Gout   GERD  Prior CV studies: Echocardiogram 05/09/2018 EF 35-40, Gr 2 DD, mod elevated RVSP (43.5), mod BAE, trivial Eff, mod MR, mild TR  Myoview 05/09/2018 EF 38, no infarct or ischemia, intermediate risk  History of Present Illness:    [name] was last seen by Dr. Acie Fredrickson in 3/21.  She returns for follow up.  She is here with her niece.  She has some chest discomfort last week on the right.  This is now resolved.  She has shortness of breath with most activities.  This is overall stable.  Her weight did increase earlier in the week but came back to baseline.  Her legs have been more swollen.  They have improved recently but are still above baseline.  She has not had syncope.  She loses her balance quite a bit and then falls.  She does have a walker at home.      Past Medical History:  Diagnosis Date  . CAP (community acquired pneumonia) 01/09/2014   "first time I've ever had it"  . Diastolic congestive heart failure ([place])   . GERD (gastroesophageal reflux disease)   . High cholesterol   . History of gout   . Hypertension   . Kidney stones   . Type II diabetes mellitus with stage 4 chronic kidney disease (HCC)     Current Medications: Current Meds  Medication Sig  . albuterol (PROVENTIL  HFA;VENTOLIN HFA) 108 (90 BASE) MCG/ACT inhaler Inhale 2 puffs into the lungs every 6 (six) hours as needed for wheezing or shortness of breath.  . allopurinol (ZYLOPRIM) 100 MG tablet Take 100 mg by mouth daily.  [name] aspirin EC 81 MG tablet Take 81 mg by mouth daily.  . carvedilol (COREG) 12.5 MG tablet Take 12.5 mg by mouth 2 (two) times daily.  [name] esomeprazole (NEXIUM) 20 MG capsule Take 20 mg by mouth daily at 12 noon.  . hydrALAZINE (APRESOLINE) 25 MG tablet Take 25 mg by mouth 2 (two) times daily.   [name] latanoprost (XALATAN) 0.005 % ophthalmic solution Place 1 drop into both eyes at bedtime.  . nitroGLYCERIN (NITROSTAT) 0.4 MG SL tablet Place 1 tablet (0.4 mg total) under the tongue every 5 (five) minutes as needed for chest pain.  [name] torsemide (DEMADEX) 20 MG tablet Take 1-2 tablets (20-40 mg total) by mouth 2 (two) times daily. Take 2 tabs daily at 8 AM and 1 tab daily after lunch at 1 PM.  . Vitamin D, Cholecalciferol, 400 UNITS CAPS Take 2 capsules by mouth daily.     Allergies:   Other, Simvastatin, and Codeine   Social History   Tobacco Use  . Smoking status: Never Smoker  . Smokeless tobacco: Never Used  Vaping Use  . Vaping  Use: Never used  Substance Use Topics  . Alcohol use: No  . Drug use: No     Family Hx: The patient's family history includes Diabetes in her brother; Hypertension in her mother. There is no history of CAD.  ROS   EKGs/Labs/Other Test Reviewed:    EKG:  EKG is  ordered today.  The ekg ordered today demonstrates sinus rhythm, HR 70, frequent PVCs, ventricular bigeminy, left bundle branch block, QTC 522  Recent Labs: 12/19/2019: BUN 83; Creatinine, Ser 4.14; Hemoglobin 11.4; Potassium 3.5; Sodium 143   Recent Lipid Panel No results found for: CHOL, TRIG, HDL, CHOLHDL, LDLCALC, LDLDIRECT  Physical Exam:    VS:  BP (!) 140/50   Pulse 70   Ht 5\' 1"  (1.549 m)   Wt 148 lb (67.1 kg)   SpO2 96%   BMI 27.96 kg/m     Wt Readings from Last 3  Encounters:  12/31/19 148 lb (67.1 kg)  07/03/19 148 lb 8 oz (67.4 kg)  01/08/19 149 lb (67.6 kg)     Constitutional:      Appearance: Healthy appearance. Not in distress.  Neck:     Vascular: No JVR.  Pulmonary:     Effort: Pulmonary effort is normal.     Breath sounds: No wheezing. No rales.  Cardiovascular:     Normal rate. Regularly irregular rhythm. Normal S1. Normal S2.     Murmurs: There is no murmur.  Edema:    Pretibial: bilateral 1+ pitting edema of the pretibial area. Abdominal:     General: There is no distension.     Palpations: Abdomen is soft.  Skin:    General: Skin is warm and dry.  Neurological:     Mental Status: Alert and oriented to person, place and time.     Cranial Nerves: Cranial nerves are intact.      ASSESSMENT & PLAN:    1. Biventricular heart failure ([place]) Her volume has increased recently.  However, this seems to be coming down.  Her legs are still more swollen than typical.  I have asked her to take an extra dose of torsemide tomorrow morning and the next day.  She is followed closely by nephrology.  Her nephrologist manages her diuretic.  I have asked her to contact her nephrologist if her swelling does not improve after 2 extra doses of torsemide.  She had a potassium of 3.5 recently.  She has lab work scheduled later this week with nephrology.  We discussed adding potassium to her medical regimen.  She would like to avoid adding any further medications if possible.  I have asked her to continue to increase her dietary potassium.  2. Dilated cardiomyopathy (HCC) EF 35-40.  Moderate diastolic dysfunction.  She is not a candidate for cardiac catheterization given chronic kidney disease.  Continue current dose of carvedilol, hydralazine.  We briefly discussed the addition of isosorbide.  She would like to avoid adding any medications if possible.  3. CKD (chronic kidney disease) stage 5, GFR less than 15 ml/min (HCC) Followed closely by nephrology.   She sees Dr. Posey Pronto.  4. Essential hypertension Blood pressure somewhat above target.  Continue to monitor.  5. Other chest pain Atypical symptoms for ischemia.  However, she is not a candidate for further testing given her chronic kidney disease.  We discussed the addition of isosorbide but she would like to hold off on this for now.  I have asked her to contact us if her symptoms should  change and she would like to try this medication.  6. PVCs She has multiple PVCs on electrocardiogram.  This is similar to her previous EKG.  She is asymptomatic.  She is not a candidate for further invasive testing.  Continue beta-blocker therapy    Dispo:  Return in about 6 months (around 06/29/2020) for Routine Follow Up, w/ Dr. Acie Fredrickson, in person.   Medication Adjustments/Labs and Tests Ordered: Current medicines are reviewed at length with the patient today.  Concerns regarding medicines are outlined above.  Tests Ordered: Orders Placed This Encounter  Procedures  . EKG 12-Lead   Medication Changes: No orders of the defined types were placed in this encounter.   Signed, Richardson Dopp, PA-C  12/31/2019 1:23 PM    [facility] [place], [place], [place]  [address] Phone: [phone]; Fax: [phone]

## 2019-12-31 ENCOUNTER — Ambulatory Visit (INDEPENDENT_AMBULATORY_CARE_PROVIDER_SITE_OTHER): Payer: Medicare Other | Admitting: Physician Assistant

## 2019-12-31 ENCOUNTER — Other Ambulatory Visit: Payer: Self-pay

## 2019-12-31 ENCOUNTER — Encounter: Payer: Self-pay | Admitting: Physician Assistant

## 2019-12-31 VITALS — BP 140/50 | HR 70 | Ht 61.0 in | Wt 148.0 lb

## 2019-12-31 DIAGNOSIS — I420 Dilated cardiomyopathy: Secondary | ICD-10-CM

## 2019-12-31 DIAGNOSIS — I10 Essential (primary) hypertension: Secondary | ICD-10-CM

## 2019-12-31 DIAGNOSIS — I5082 Biventricular heart failure: Secondary | ICD-10-CM | POA: Diagnosis not present

## 2019-12-31 DIAGNOSIS — I493 Ventricular premature depolarization: Secondary | ICD-10-CM

## 2019-12-31 DIAGNOSIS — N185 Chronic kidney disease, stage 5: Secondary | ICD-10-CM | POA: Diagnosis not present

## 2019-12-31 DIAGNOSIS — R0789 Other chest pain: Secondary | ICD-10-CM

## 2019-12-31 NOTE — Patient Instructions (Signed)
Medication Instructions:  Your physician has recommended you make the following change in your medication:   1) Take an extra Torsemide 20 mg in the morning for 2 days  *If you need a refill on your cardiac medications before your next appointment, please call your pharmacy*  Lab Work: None ordered today  If you have labs (blood work) drawn today and your tests are completely normal, you will receive your results only by: [name] MyChart Message (if you have MyChart) OR . A paper copy in the mail If you have any lab test that is abnormal or we need to change your treatment, we will call you to review the results.  Testing/Procedures: None ordered today  Follow-Up: On 06/29/2020 at 1:20PM with Mertie Moores, MD  Other Instructions If swelling does not get better call Dr. Posey Pronto

## 2020-01-02 ENCOUNTER — Encounter (HOSPITAL_COMMUNITY)
Admission: RE | Admit: 2020-01-02 | Discharge: 2020-01-02 | Disposition: A | Discharge status: Home / Self Care | Payer: Medicare Other | Source: Ambulatory Visit | Attending: Nephrology | Admitting: Nephrology

## 2020-01-02 ENCOUNTER — Other Ambulatory Visit: Payer: Self-pay

## 2020-01-02 VITALS — BP 129/66 | HR 56 | Temp 97.1°F | Resp 20

## 2020-01-02 DIAGNOSIS — N184 Chronic kidney disease, stage 4 (severe): Secondary | ICD-10-CM | POA: Diagnosis not present

## 2020-01-02 MED ORDER — EPOETIN ALFA 20000 UNIT/ML IJ SOLN: 20000.0000 [IU] | INTRAMUSCULAR | Status: DC

## 2020-01-03 LAB — POCT HEMOGLOBIN-HEMACUE: Hemoglobin: 12.3 g/dL (ref 12.0–15.0)

## 2020-01-06 ENCOUNTER — Telehealth: Payer: Self-pay | Admitting: Cardiovascular Disease

## 2020-01-06 NOTE — Telephone Encounter (Signed)
Ronni Rumble with home health. Informed her that patient saw Richardson Dopp PA last week. He encouraged her to take extra dose of torsemide for a couple of days, and if her swelling does not improve to contact her nephrologist. Patient's Nephrologist manages her diuretic and follows her closely. Otila Kluver asked if we could call patient and tell her to follow up with her nephrologist. Called patient and told her to call her nephrologist and to let him know what is going on. Patient agreed to plan.

## 2020-01-06 NOTE — Telephone Encounter (Signed)
Pt c/o swelling: STAT is pt has developed SOB within 24 hours  1) How much weight have you gained and in what time span? 2 lbs in a day   2) If swelling, where is the swelling located? Lower legs and abdomin   3) Are you currently taking a fluid pill? Yes   4) Are you currently SOB? Not rn, but with activity   5) Do you have a log of your daily weights (if so, list)? No    6) Have you gained 3 pounds in a day or 5 pounds in a week? Pt says no   7) Have you traveled recently? No

## 2020-01-06 NOTE — Telephone Encounter (Signed)
She can take an extra dose of Torsemide 20 mg x 1 and wait for further recommendations from her Nephrologist. Richardson Dopp, PA-C    01/06/2020 5:29 PM

## 2020-01-07 NOTE — Telephone Encounter (Signed)
Called patient back with Doris Howell recommendations. Patient verbalized understanding.

## 2020-01-15 ENCOUNTER — Other Ambulatory Visit (HOSPITAL_COMMUNITY): Payer: Self-pay

## 2020-01-16 ENCOUNTER — Other Ambulatory Visit: Payer: Self-pay

## 2020-01-16 ENCOUNTER — Encounter (HOSPITAL_COMMUNITY)
Admission: RE | Admit: 2020-01-16 | Discharge: 2020-01-16 | Disposition: A | Discharge status: Home / Self Care | Payer: Medicare Other | Source: Ambulatory Visit | Attending: Nephrology | Admitting: Nephrology

## 2020-01-16 VITALS — BP 140/38 | HR 68 | Resp 18

## 2020-01-16 DIAGNOSIS — N184 Chronic kidney disease, stage 4 (severe): Secondary | ICD-10-CM | POA: Diagnosis present

## 2020-01-16 LAB — RENAL FUNCTION PANEL
Albumin: 3.6 g/dL (ref 3.5–5.0)
Anion gap: 15 (ref 5–15)
BUN: 96 mg/dL — ABNORMAL HIGH (ref 8–23)
CO2: 22 mmol/L (ref 22–32)
Calcium: 8.9 mg/dL (ref 8.9–10.3)
Chloride: 104 mmol/L (ref 98–111)
Creatinine, Ser: 4.51 mg/dL — ABNORMAL HIGH (ref 0.44–1.00)
GFR calc non Af Amer: 8 mL/min — ABNORMAL LOW (ref >60)
Glucose, Bld: 140 mg/dL — ABNORMAL HIGH (ref 70–99)
Phosphorus: 5.7 mg/dL — ABNORMAL HIGH (ref 2.5–4.6)
Potassium: 3.4 mmol/L — ABNORMAL LOW (ref 3.5–5.1)
Sodium: 141 mmol/L (ref 135–145)

## 2020-01-16 LAB — POCT HEMOGLOBIN-HEMACUE: Hemoglobin: 11.4 g/dL — ABNORMAL LOW (ref 12.0–15.0)

## 2020-01-16 LAB — IRON AND TIBC
Iron: 76 ug/dL (ref 28–170)
Saturation Ratios: 25 % (ref 10.4–31.8)
TIBC: 304 ug/dL (ref 250–450)
UIBC: 228 ug/dL

## 2020-01-16 LAB — FERRITIN: Ferritin: 293 ng/mL (ref 11–307)

## 2020-01-16 MED ORDER — EPOETIN ALFA 20000 UNIT/ML IJ SOLN
20000.0000 [IU] | INTRAMUSCULAR | Status: DC
  Administered 2020-01-16: 20000 [IU] via SUBCUTANEOUS

## 2020-01-16 MED ORDER — EPOETIN ALFA 20000 UNIT/ML IJ SOLN
INTRAMUSCULAR | Status: AC
  Filled 2020-01-16: qty 1

## 2020-01-17 LAB — PTH, INTACT AND CALCIUM
Calcium, Total (PTH): 8.8 mg/dL (ref 8.7–10.3)
PTH: 277 pg/mL — ABNORMAL HIGH (ref 15–65)

## 2020-01-30 ENCOUNTER — Other Ambulatory Visit: Payer: Self-pay

## 2020-01-30 ENCOUNTER — Encounter (HOSPITAL_COMMUNITY)
Admission: RE | Admit: 2020-01-30 | Discharge: 2020-01-30 | Disposition: A | Discharge status: Home / Self Care | Payer: Medicare Other | Source: Ambulatory Visit | Attending: Nephrology | Admitting: Nephrology

## 2020-01-30 VITALS — BP 119/44 | HR 71 | Temp 97.4°F | Resp 18

## 2020-01-30 DIAGNOSIS — N184 Chronic kidney disease, stage 4 (severe): Secondary | ICD-10-CM

## 2020-01-30 LAB — POCT HEMOGLOBIN-HEMACUE: Hemoglobin: 10.8 g/dL — ABNORMAL LOW (ref 12.0–15.0)

## 2020-01-30 MED ORDER — EPOETIN ALFA 20000 UNIT/ML IJ SOLN
INTRAMUSCULAR | Status: AC
  Administered 2020-01-30: 20000 [IU] via SUBCUTANEOUS
  Filled 2020-01-30: qty 1

## 2020-01-30 MED ORDER — EPOETIN ALFA 20000 UNIT/ML IJ SOLN: 20000.0000 [IU] | INTRAMUSCULAR | Status: DC

## 2020-02-08 ENCOUNTER — Ambulatory Visit: Payer: Medicare Other | Attending: Internal Medicine

## 2020-02-08 DIAGNOSIS — Z23 Encounter for immunization: Secondary | ICD-10-CM

## 2020-02-08 NOTE — Progress Notes (Signed)
   Covid-19 Vaccination Clinic  Name:  LEEANN BADY    MRN: [id number] DOB: Dec 24, 1935  02/08/2020  Ms. Wymer was observed post Covid-19 immunization for 15 minutes without incident. She was provided with Vaccine Information Sheet and instruction to access the V-Safe system.   Ms. Kaufman was instructed to call 911 with any severe reactions post vaccine: [name] Difficulty breathing  . Swelling of face and throat  . A fast heartbeat  . A bad rash all over body  . Dizziness and weakness

## 2020-02-11 ENCOUNTER — Other Ambulatory Visit: Payer: Self-pay | Admitting: Internal Medicine

## 2020-02-11 ENCOUNTER — Ambulatory Visit
Admission: RE | Admit: 2020-02-11 | Discharge: 2020-02-11 | Disposition: A | Discharge status: Home / Self Care | Payer: Medicare Other | Source: Ambulatory Visit | Attending: Internal Medicine | Admitting: Internal Medicine

## 2020-02-11 DIAGNOSIS — R0600 Dyspnea, unspecified: Secondary | ICD-10-CM

## 2020-02-12 ENCOUNTER — Other Ambulatory Visit: Payer: Self-pay

## 2020-02-12 ENCOUNTER — Ambulatory Visit: Payer: Medicare Other | Admitting: Podiatry

## 2020-02-12 DIAGNOSIS — M79674 Pain in right toe(s): Secondary | ICD-10-CM

## 2020-02-12 DIAGNOSIS — E1122 Type 2 diabetes mellitus with diabetic chronic kidney disease: Secondary | ICD-10-CM | POA: Diagnosis not present

## 2020-02-12 DIAGNOSIS — N184 Chronic kidney disease, stage 4 (severe): Secondary | ICD-10-CM

## 2020-02-12 DIAGNOSIS — M7751 Other enthesopathy of right foot: Secondary | ICD-10-CM

## 2020-02-12 DIAGNOSIS — M79675 Pain in left toe(s): Secondary | ICD-10-CM | POA: Diagnosis not present

## 2020-02-12 DIAGNOSIS — B351 Tinea unguium: Secondary | ICD-10-CM | POA: Diagnosis not present

## 2020-02-13 ENCOUNTER — Encounter (HOSPITAL_COMMUNITY): Payer: Self-pay | Admitting: Emergency Medicine

## 2020-02-13 ENCOUNTER — Encounter: Payer: Self-pay | Admitting: Podiatry

## 2020-02-13 ENCOUNTER — Other Ambulatory Visit: Payer: Self-pay

## 2020-02-13 ENCOUNTER — Emergency Department (HOSPITAL_COMMUNITY): Payer: Medicare Other

## 2020-02-13 ENCOUNTER — Inpatient Hospital Stay (HOSPITAL_COMMUNITY)
Admission: EM | Admit: 2020-02-13 | Discharge: 2020-02-20 | DRG: 291 | Disposition: A | Discharge status: Hospice | Payer: Medicare Other | Attending: Family Medicine | Admitting: Family Medicine

## 2020-02-13 DIAGNOSIS — I5023 Acute on chronic systolic (congestive) heart failure: Secondary | ICD-10-CM | POA: Diagnosis present

## 2020-02-13 DIAGNOSIS — I2721 Secondary pulmonary arterial hypertension: Secondary | ICD-10-CM | POA: Diagnosis present

## 2020-02-13 DIAGNOSIS — I447 Left bundle-branch block, unspecified: Secondary | ICD-10-CM | POA: Diagnosis present

## 2020-02-13 DIAGNOSIS — I5082 Biventricular heart failure: Secondary | ICD-10-CM | POA: Diagnosis present

## 2020-02-13 DIAGNOSIS — Z79899 Other long term (current) drug therapy: Secondary | ICD-10-CM

## 2020-02-13 DIAGNOSIS — I5033 Acute on chronic diastolic (congestive) heart failure: Secondary | ICD-10-CM | POA: Diagnosis not present

## 2020-02-13 DIAGNOSIS — W010XXA Fall on same level from slipping, tripping and stumbling without subsequent striking against object, initial encounter: Secondary | ICD-10-CM | POA: Diagnosis present

## 2020-02-13 DIAGNOSIS — M1A9XX0 Chronic gout, unspecified, without tophus (tophi): Secondary | ICD-10-CM | POA: Diagnosis present

## 2020-02-13 DIAGNOSIS — I132 Hypertensive heart and chronic kidney disease with heart failure and with stage 5 chronic kidney disease, or end stage renal disease: Secondary | ICD-10-CM | POA: Diagnosis present

## 2020-02-13 DIAGNOSIS — G4733 Obstructive sleep apnea (adult) (pediatric): Secondary | ICD-10-CM | POA: Diagnosis present

## 2020-02-13 DIAGNOSIS — I878 Other specified disorders of veins: Secondary | ICD-10-CM | POA: Diagnosis present

## 2020-02-13 DIAGNOSIS — I340 Nonrheumatic mitral (valve) insufficiency: Secondary | ICD-10-CM | POA: Diagnosis not present

## 2020-02-13 DIAGNOSIS — G9349 Other encephalopathy: Secondary | ICD-10-CM | POA: Diagnosis not present

## 2020-02-13 DIAGNOSIS — Z8249 Family history of ischemic heart disease and other diseases of the circulatory system: Secondary | ICD-10-CM

## 2020-02-13 DIAGNOSIS — N185 Chronic kidney disease, stage 5: Secondary | ICD-10-CM | POA: Diagnosis present

## 2020-02-13 DIAGNOSIS — N184 Chronic kidney disease, stage 4 (severe): Secondary | ICD-10-CM | POA: Diagnosis not present

## 2020-02-13 DIAGNOSIS — M79604 Pain in right leg: Secondary | ICD-10-CM | POA: Diagnosis not present

## 2020-02-13 DIAGNOSIS — M79609 Pain in unspecified limb: Secondary | ICD-10-CM | POA: Diagnosis not present

## 2020-02-13 DIAGNOSIS — S7001XA Contusion of right hip, initial encounter: Secondary | ICD-10-CM | POA: Diagnosis present

## 2020-02-13 DIAGNOSIS — E785 Hyperlipidemia, unspecified: Secondary | ICD-10-CM | POA: Diagnosis present

## 2020-02-13 DIAGNOSIS — I081 Rheumatic disorders of both mitral and tricuspid valves: Secondary | ICD-10-CM | POA: Diagnosis present

## 2020-02-13 DIAGNOSIS — E7800 Pure hypercholesterolemia, unspecified: Secondary | ICD-10-CM | POA: Diagnosis present

## 2020-02-13 DIAGNOSIS — Z20822 Contact with and (suspected) exposure to covid-19: Secondary | ICD-10-CM | POA: Diagnosis present

## 2020-02-13 DIAGNOSIS — E669 Obesity, unspecified: Secondary | ICD-10-CM | POA: Diagnosis present

## 2020-02-13 DIAGNOSIS — I10 Essential (primary) hypertension: Secondary | ICD-10-CM | POA: Diagnosis not present

## 2020-02-13 DIAGNOSIS — M1040 Other secondary gout, unspecified site: Secondary | ICD-10-CM | POA: Diagnosis not present

## 2020-02-13 DIAGNOSIS — Z87442 Personal history of urinary calculi: Secondary | ICD-10-CM

## 2020-02-13 DIAGNOSIS — Z888 Allergy status to other drugs, medicaments and biological substances status: Secondary | ICD-10-CM

## 2020-02-13 DIAGNOSIS — N186 End stage renal disease: Secondary | ICD-10-CM | POA: Diagnosis not present

## 2020-02-13 DIAGNOSIS — S40021A Contusion of right upper arm, initial encounter: Secondary | ICD-10-CM | POA: Diagnosis present

## 2020-02-13 DIAGNOSIS — D631 Anemia in chronic kidney disease: Secondary | ICD-10-CM | POA: Diagnosis present

## 2020-02-13 DIAGNOSIS — Z515 Encounter for palliative care: Secondary | ICD-10-CM

## 2020-02-13 DIAGNOSIS — R0602 Shortness of breath: Secondary | ICD-10-CM

## 2020-02-13 DIAGNOSIS — G9341 Metabolic encephalopathy: Secondary | ICD-10-CM | POA: Diagnosis present

## 2020-02-13 DIAGNOSIS — I493 Ventricular premature depolarization: Secondary | ICD-10-CM | POA: Diagnosis not present

## 2020-02-13 DIAGNOSIS — K219 Gastro-esophageal reflux disease without esophagitis: Secondary | ICD-10-CM | POA: Diagnosis present

## 2020-02-13 DIAGNOSIS — M109 Gout, unspecified: Secondary | ICD-10-CM | POA: Diagnosis present

## 2020-02-13 DIAGNOSIS — E872 Acidosis: Secondary | ICD-10-CM | POA: Diagnosis not present

## 2020-02-13 DIAGNOSIS — Z66 Do not resuscitate: Secondary | ICD-10-CM | POA: Diagnosis not present

## 2020-02-13 DIAGNOSIS — R262 Difficulty in walking, not elsewhere classified: Secondary | ICD-10-CM | POA: Diagnosis present

## 2020-02-13 DIAGNOSIS — Z905 Acquired absence of kidney: Secondary | ICD-10-CM

## 2020-02-13 DIAGNOSIS — W19XXXA Unspecified fall, initial encounter: Secondary | ICD-10-CM | POA: Diagnosis present

## 2020-02-13 DIAGNOSIS — Z7982 Long term (current) use of aspirin: Secondary | ICD-10-CM

## 2020-02-13 DIAGNOSIS — I509 Heart failure, unspecified: Principal | ICD-10-CM

## 2020-02-13 DIAGNOSIS — S301XXA Contusion of abdominal wall, initial encounter: Secondary | ICD-10-CM | POA: Diagnosis present

## 2020-02-13 DIAGNOSIS — Z833 Family history of diabetes mellitus: Secondary | ICD-10-CM

## 2020-02-13 DIAGNOSIS — R001 Bradycardia, unspecified: Secondary | ICD-10-CM | POA: Diagnosis present

## 2020-02-13 DIAGNOSIS — I361 Nonrheumatic tricuspid (valve) insufficiency: Secondary | ICD-10-CM | POA: Diagnosis not present

## 2020-02-13 DIAGNOSIS — M7989 Other specified soft tissue disorders: Secondary | ICD-10-CM | POA: Diagnosis not present

## 2020-02-13 DIAGNOSIS — Z5329 Procedure and treatment not carried out because of patient's decision for other reasons: Secondary | ICD-10-CM | POA: Diagnosis not present

## 2020-02-13 DIAGNOSIS — Z90710 Acquired absence of both cervix and uterus: Secondary | ICD-10-CM

## 2020-02-13 DIAGNOSIS — E1122 Type 2 diabetes mellitus with diabetic chronic kidney disease: Secondary | ICD-10-CM | POA: Diagnosis present

## 2020-02-13 DIAGNOSIS — Z885 Allergy status to narcotic agent status: Secondary | ICD-10-CM

## 2020-02-13 DIAGNOSIS — I5020 Unspecified systolic (congestive) heart failure: Secondary | ICD-10-CM | POA: Diagnosis not present

## 2020-02-13 LAB — BASIC METABOLIC PANEL
Anion gap: 18 — ABNORMAL HIGH (ref 5–15)
BUN: 103 mg/dL — ABNORMAL HIGH (ref 8–23)
CO2: 21 mmol/L — ABNORMAL LOW (ref 22–32)
Calcium: 8.5 mg/dL — ABNORMAL LOW (ref 8.9–10.3)
Chloride: 105 mmol/L (ref 98–111)
Creatinine, Ser: 4.98 mg/dL — ABNORMAL HIGH (ref 0.44–1.00)
GFR, Estimated: 8 mL/min — ABNORMAL LOW (ref >60)
Glucose, Bld: 109 mg/dL — ABNORMAL HIGH (ref 70–99)
Potassium: 3.5 mmol/L (ref 3.5–5.1)
Sodium: 144 mmol/L (ref 135–145)

## 2020-02-13 LAB — URINALYSIS, ROUTINE W REFLEX MICROSCOPIC
Bilirubin Urine: NEGATIVE
Glucose, UA: NEGATIVE mg/dL
Hgb urine dipstick: NEGATIVE
Ketones, ur: NEGATIVE mg/dL
Leukocytes,Ua: NEGATIVE
Nitrite: NEGATIVE
Protein, ur: NEGATIVE mg/dL
Specific Gravity, Urine: 1.008 (ref 1.005–1.030)
pH: 5 (ref 5.0–8.0)

## 2020-02-13 LAB — CBG MONITORING, ED: Glucose-Capillary: 102 mg/dL — ABNORMAL HIGH (ref 70–99)

## 2020-02-13 LAB — BRAIN NATRIURETIC PEPTIDE: B Natriuretic Peptide: 3370.2 pg/mL — ABNORMAL HIGH (ref 0.0–100.0)

## 2020-02-13 LAB — MAGNESIUM: Magnesium: 2 mg/dL (ref 1.7–2.4)

## 2020-02-13 LAB — CBC
HCT: 36.9 % (ref 36.0–46.0)
Hemoglobin: 11 g/dL — ABNORMAL LOW (ref 12.0–15.0)
MCH: 31 pg (ref 26.0–34.0)
MCHC: 29.8 g/dL — ABNORMAL LOW (ref 30.0–36.0)
MCV: 103.9 fL — ABNORMAL HIGH (ref 80.0–100.0)
Platelets: 121 10*3/uL — ABNORMAL LOW (ref 150–400)
RBC: 3.55 MIL/uL — ABNORMAL LOW (ref 3.87–5.11)
RDW: 16.5 % — ABNORMAL HIGH (ref 11.5–15.5)
WBC: 3.4 10*3/uL — ABNORMAL LOW (ref 4.0–10.5)
nRBC: 0 % (ref 0.0–0.2)

## 2020-02-13 LAB — RESP PANEL BY RT PCR (RSV, FLU A&B, COVID)
Influenza A by PCR: NEGATIVE
Influenza B by PCR: NEGATIVE
Respiratory Syncytial Virus by PCR: NEGATIVE
SARS Coronavirus 2 by RT PCR: NEGATIVE

## 2020-02-13 MED ORDER — FUROSEMIDE 10 MG/ML IJ SOLN
60.0000 mg | Freq: Two times a day (BID) | INTRAMUSCULAR | Status: AC
  Administered 2020-02-14: 60 mg via INTRAVENOUS
  Filled 2020-02-13: qty 6

## 2020-02-13 MED ORDER — ENOXAPARIN SODIUM 30 MG/0.3ML ~~LOC~~ SOLN
30.0000 mg | SUBCUTANEOUS | Status: DC
  Administered 2020-02-14: 30 mg via SUBCUTANEOUS
  Filled 2020-02-13: qty 0.3

## 2020-02-13 MED ORDER — PANTOPRAZOLE SODIUM 40 MG PO TBEC
40.0000 mg | DELAYED_RELEASE_TABLET | Freq: Every day | ORAL | Status: DC
  Administered 2020-02-13 – 2020-02-19 (×6): 40 mg via ORAL
  Filled 2020-02-13 (×6): qty 1

## 2020-02-13 MED ORDER — ALLOPURINOL 100 MG PO TABS
100.0000 mg | ORAL_TABLET | Freq: Two times a day (BID) | ORAL | Status: DC
  Administered 2020-02-14 – 2020-02-19 (×11): 100 mg via ORAL
  Filled 2020-02-13 (×14): qty 1

## 2020-02-13 MED ORDER — ALBUTEROL SULFATE HFA 108 (90 BASE) MCG/ACT IN AERS
2.0000 | INHALATION_SPRAY | Freq: Four times a day (QID) | RESPIRATORY_TRACT | Status: DC | PRN
  Administered 2020-02-15 – 2020-02-16 (×3): 2 via RESPIRATORY_TRACT
  Filled 2020-02-13: qty 6.7

## 2020-02-13 MED ORDER — CARVEDILOL 12.5 MG PO TABS: 6.2500 mg | ORAL_TABLET | Freq: Two times a day (BID) | ORAL | Status: DC

## 2020-02-13 MED ORDER — SODIUM CHLORIDE 0.9% FLUSH
3.0000 mL | Freq: Two times a day (BID) | INTRAVENOUS | Status: DC
  Administered 2020-02-13 – 2020-02-20 (×10): 3 mL via INTRAVENOUS

## 2020-02-13 MED ORDER — ACETAMINOPHEN 650 MG RE SUPP: 650.0000 mg | Freq: Four times a day (QID) | RECTAL | Status: DC | PRN

## 2020-02-13 MED ORDER — LATANOPROST 0.005 % OP SOLN
1.0000 [drp] | Freq: Every day | OPHTHALMIC | Status: DC
  Administered 2020-02-14 – 2020-02-18 (×6): 1 [drp] via OPHTHALMIC
  Filled 2020-02-13 (×3): qty 2.5

## 2020-02-13 MED ORDER — CARVEDILOL 6.25 MG PO TABS
6.2500 mg | ORAL_TABLET | Freq: Two times a day (BID) | ORAL | Status: DC
  Administered 2020-02-14 – 2020-02-16 (×6): 6.25 mg via ORAL
  Filled 2020-02-13 (×5): qty 1
  Filled 2020-02-13: qty 2

## 2020-02-13 MED ORDER — FUROSEMIDE 10 MG/ML IJ SOLN
60.0000 mg | Freq: Once | INTRAMUSCULAR | Status: AC
  Administered 2020-02-13: 60 mg via INTRAVENOUS
  Filled 2020-02-13: qty 6

## 2020-02-13 MED ORDER — ACETAMINOPHEN 325 MG PO TABS
650.0000 mg | ORAL_TABLET | Freq: Four times a day (QID) | ORAL | Status: DC | PRN
  Administered 2020-02-14 – 2020-02-18 (×5): 650 mg via ORAL
  Filled 2020-02-13 (×6): qty 2

## 2020-02-13 MED ORDER — ASPIRIN EC 81 MG PO TBEC
81.0000 mg | DELAYED_RELEASE_TABLET | Freq: Every day | ORAL | Status: DC
  Administered 2020-02-13 – 2020-02-18 (×5): 81 mg via ORAL
  Filled 2020-02-13 (×5): qty 1

## 2020-02-13 NOTE — Progress Notes (Signed)
  Subjective:  Patient ID: Doris Howell, female    DOB: September 22, 1935,  MRN: [id number]  Chief Complaint  Patient presents with  . Toe Pain     (np) right great toe pain   84 y.o. female returns for the above complaint.  Patient presents with thickened elongated dystrophic toenails x10.  Patient states is mildly painful to touch.  She would like to have them debrided down she is not able to do it herself.  She is a diabetic type II with CKD.  Her last sugar/A1c was 5.9.  She denies any other acute complaints.  Objective:  There were no vitals filed for this visit. Podiatric Exam: Vascular: dorsalis pedis and posterior tibial pulses are palpable bilateral. Capillary return is immediate. Temperature gradient is WNL. Skin turgor WNL  Sensorium: Normal Semmes Weinstein monofilament test. Normal tactile sensation bilaterally. Nail Exam: Pt has thick disfigured discolored nails with subungual debris noted bilateral entire nail hallux through fifth toenails.  Pain on palpation to the nails. Ulcer Exam: There is no evidence of ulcer or pre-ulcerative changes or infection. Orthopedic Exam: Muscle tone and strength are WNL. No limitations in general ROM. No crepitus or effusions noted. HAV  B/L.  Hammer toes 2-5  B/L. Skin: No Porokeratosis. No infection or ulcers    Assessment & Plan:   1. Pain due to onychomycosis of toenails of both feet   2. Type 2 diabetes mellitus with stage 4 chronic kidney disease, without long-term current use of insulin ([place])     Patient was evaluated and treated and all questions answered.  Onychomycosis with pain  -Nails palliatively debrided as below. -Educated on self-care  Procedure: Nail Debridement Rationale: pain  Type of Debridement: manual, sharp debridement. Instrumentation: Nail nipper, rotary burr. Number of Nails: 10  Procedures and Treatment: Consent by patient was obtained for treatment procedures. The patient understood the discussion of  treatment and procedures well. All questions were answered thoroughly reviewed. Debridement of mycotic and hypertrophic toenails, 1 through 5 bilateral and clearing of subungual debris. No ulceration, no infection noted.  Return Visit-Office Procedure: Patient instructed to return to the office for a follow up visit 3 months for continued evaluation and treatment.  Boneta Lucks, DPM    No follow-ups on file.

## 2020-02-13 NOTE — H&P (Signed)
History and Physical   Doris Howell ATF:[id number] DOB: November 01, 1935 DOA: 02/13/2020  PCP: Lavone Orn, MD   Patient coming from: Home  Chief Complaint: Shortness of breath and edema  HPI: Doris Howell is a 84 y.o. female with medical history significant of CHF, CKD 4, GERD, obesity, pulmonary hypertension, history of diabetes who presents with 1 week of progressive dyspnea and edema.  She states that she has had worsening edema and shortness of breath for the past 1 week.  She endorses dyspnea on exertion and mild orthopnea.  She states her legs have been significantly more swollen and have felt heavy.  She reports that she has had a fall due to some difficulty ambulating with her edema.  She states she has been taking her medications and she denies increased oral intake.  She reports some leg aching along with her edema.  She denies chest pain, syncopal symptoms, lightheadedness, palpitations, abdominal pain.  ED Course: Vital signs stable in the ED, occasional mild tachypnea to 22.  Labs showed potassium of 3.5, creatinine stable at 4.9, hemoglobin stable at 11, platelets of 121.  BNP elevated to 3300.  UA and respiratory panel pending of time she was seen.  EKG showed sinus rhythm with frequent PVCs and occasional PACs.  Review of Systems: As per HPI otherwise all other systems reviewed and are negative.  Past Medical History:  Diagnosis Date   CAP (community acquired pneumonia) 01/09/2014   "first time I've ever had it"   Diastolic congestive heart failure (HCC)    GERD (gastroesophageal reflux disease)    High cholesterol    History of gout    Hypertension    Kidney stones    Type II diabetes mellitus with stage 4 chronic kidney disease ([place])     Past Surgical History:  Procedure Laterality Date   ABDOMINAL HYSTERECTOMY  ~ 1974   CATARACT EXTRACTION W/ INTRAOCULAR LENS  IMPLANT, BILATERAL Bilateral 2014   COLONOSCOPY WITH PROPOFOL N/A 10/23/2012    Procedure: COLONOSCOPY WITH PROPOFOL;  Surgeon: [name], MD;  Location: [facility] ENDOSCOPY;  Service: Endoscopy;  Laterality: N/A;   KIDNEY STONE SURGERY  10-05-12   "cut me on the side"   LITHOTRIPSY  1980's?   RIGHT HEART CATHETERIZATION N/A 04/15/2014   Procedure: RIGHT HEART CATH;  Surgeon: Sanda Klein, MD;  Location: [facility] CATH LAB;  Service: Cardiovascular;  Laterality: N/A;    Social History  reports that she has never smoked. She has never used smokeless tobacco. She reports that she does not drink alcohol and does not use drugs.  Allergies  Allergen Reactions   Other Nausea Only    pholcodeine   Simvastatin Other (See Comments)    Myalgias- MD took patient off med   Codeine Other (See Comments)    Unknown; pt can't remember. It was in the '70s     Family History  Problem Relation Age of Onset   Hypertension Mother    Diabetes Brother    CAD Neg Hx   Reviewed on addition  Prior to Admission medications   Medication Sig Start Date End Date Taking? Authorizing Provider  Acetaminophen (TYLENOL ARTHRITIS PAIN PO) Take 2 tablets by mouth daily as needed (pain).   Yes [provider]  albuterol (PROVENTIL HFA;VENTOLIN HFA) 108 (90 BASE) MCG/ACT inhaler Inhale 2 puffs into the lungs every 6 (six) hours as needed for wheezing or shortness of breath. 01/13/14  Yes [name], MD  allopurinol (ZYLOPRIM) 100 MG tablet Take  100 mg by mouth 2 (two) times daily.  06/28/19  Yes [provider]  aspirin EC 81 MG tablet Take 81 mg by mouth daily.   Yes [provider]  carvedilol (COREG) 6.25 MG tablet Take 6.25 mg by mouth 2 (two) times daily with a meal.   Yes [provider]  esomeprazole (NEXIUM) 40 MG capsule Take 40 mg by mouth daily at 12 noon.   Yes [provider]  hydrALAZINE (APRESOLINE) 25 MG tablet Take 25 mg by mouth 2 (two) times daily.    Yes [provider]  latanoprost (XALATAN) 0.005 % ophthalmic solution  Place 1 drop into both eyes at bedtime.   Yes [provider]  nitroGLYCERIN (NITROSTAT) 0.4 MG SL tablet Place 1 tablet (0.4 mg total) under the tongue every 5 (five) minutes as needed for chest pain. 04/24/18  Yes Nahser, Wonda Cheng, MD  torsemide (DEMADEX) 20 MG tablet Take 1-2 tablets (20-40 mg total) by mouth 2 (two) times daily. Take 2 tabs daily at 8 AM and 1 tab daily after lunch at 1 PM. Patient taking differently: Take 80 mg by mouth daily.  09/11/14  Yes Hongalgi, Lenis Dickinson, MD  Vitamin D, Cholecalciferol, 400 UNITS CAPS Take 2 capsules by mouth daily.   Yes [provider]    Physical Exam: Vitals:   02/13/20 2245 02/13/20 2300 02/13/20 2315 02/13/20 2330  BP: 133/81 (!) 121/51 (!) 121/57 101/60  Pulse: (!) 57 (!) 47 (!) 45 68  Resp: (!) 26 (!) 22 (!) 31 (!) 22  Temp:      TempSrc:      SpO2: 97% 99% 97% 99%  Weight:      Height:       Physical Exam Constitutional:      General: She is not in acute distress.    Appearance: Normal appearance.  HENT:     Head: Normocephalic and atraumatic.     Mouth/Throat:     Mouth: Mucous membranes are moist.     Pharynx: Oropharynx is clear.  Eyes:     Extraocular Movements: Extraocular movements intact.     Pupils: Pupils are equal, round, and reactive to light.  Cardiovascular:     Rate and Rhythm: Normal rate and regular rhythm.     Pulses: Normal pulses.     Heart sounds: Normal heart sounds.  Pulmonary:     Effort: Pulmonary effort is normal. No respiratory distress.     Comments: Trace basilar rales Abdominal:     General: Bowel sounds are normal. There is no distension.     Palpations: Abdomen is soft.     Tenderness: There is no abdominal tenderness.  Musculoskeletal:        General: No swelling or deformity.     Right lower leg: Edema present.     Left lower leg: Edema present.  Skin:    General: Skin is warm and dry.  Neurological:     General: No focal deficit present.     Mental Status: Mental  status is at baseline.    Labs on Admission: I have personally reviewed following labs and imaging studies  CBC: Recent Labs  Lab 02/13/20 1414  WBC 3.4*  HGB 11.0*  HCT 36.9  MCV 103.9*  PLT 121*    Basic Metabolic Panel: Recent Labs  Lab 02/13/20 1414 02/13/20 1920  NA 144  --   K 3.5  --   CL 105  --   CO2 21*  --  GLUCOSE 109*  --   BUN 103*  --   CREATININE 4.98*  --   CALCIUM 8.5*  --   MG  --  2.0    GFR: Estimated Creatinine Clearance: 7.5 mL/min (A) (by C-G formula based on SCr of 4.98 mg/dL (H)).  Liver Function Tests: No results for input(s): AST, ALT, ALKPHOS, BILITOT, PROT, ALBUMIN in the last 168 hours.  Urine analysis:    Component Value Date/Time   COLORURINE STRAW (A) 02/13/2020 1922   APPEARANCEUR CLEAR 02/13/2020 1922   LABSPEC 1.008 02/13/2020 [place] 5.0 02/13/2020 1922   GLUCOSEU NEGATIVE 02/13/2020 [place] NEGATIVE 02/13/2020 [place] NEGATIVE 02/13/2020 [place] NEGATIVE 02/13/2020 1922   PROTEINUR NEGATIVE 02/13/2020 1922   UROBILINOGEN 0.2 08/29/2014 1848   NITRITE NEGATIVE 02/13/2020 1922   LEUKOCYTESUR NEGATIVE 02/13/2020 1922    Radiological Exams on Admission: CT ABDOMEN PELVIS WO CONTRAST  Result Date: 02/13/2020 CLINICAL DATA:  Recent fall with flank bruising, initial encounter EXAM: CT ABDOMEN AND PELVIS WITHOUT CONTRAST TECHNIQUE: Multidetector CT imaging of the abdomen and pelvis was performed following the standard protocol without IV contrast. COMPARISON:  None. FINDINGS: Lower chest: Small pleural effusions are noted bilaterally right greater than left. Mild bibasilar atelectasis is seen. Cardiomegaly is noted. Hepatobiliary: No focal liver abnormality is seen. No gallstones, gallbladder wall thickening, or biliary dilatation. Pancreas: Unremarkable. No pancreatic ductal dilatation or surrounding inflammatory changes. Spleen: Normal in size without focal abnormality. Adrenals/Urinary Tract:  Adrenal glands are within normal limits. Right kidney shows no obstructive or inflammatory changes. The left kidney has been surgically removed. Dystrophic calcifications in the surgical bed are noted. Bladder is well distended. Stomach/Bowel: Scattered diverticular change of the colon is noted without evidence of diverticulitis. No obstructive changes are seen. Stomach and small bowel are within normal limits. Vascular/Lymphatic: Aortic atherosclerosis. No enlarged abdominal or pelvic lymph nodes. Reproductive: Status post hysterectomy. No adnexal masses. Other: Minimal ascites is noted. Small ventral hernia containing fluid and fat is noted anterior to the liver. Mild changes of anasarca are noted. Musculoskeletal: Degenerative changes of the thoracolumbar spine are noted. No acute bony abnormality is seen. Increased soft tissue density is noted in the left flank consistent with the given clinical history of bruising. No definitive underlying rib fracture is seen. IMPRESSION: Soft tissue changes consistent with the recent fall and flank bruising. No rib fractures are no visceral injury is seen. Status post left nephrectomy. Diverticulosis without diverticulitis. Small anterior abdominal wall hernia containing fat and fluid. Mild ascites and small bilateral effusions. Mild changes of anasarca are noted as well. Stable cardiomegaly. Electronically Signed   By: Inez Catalina M.D.   On: 02/13/2020 18:38   DG Chest 2 View  Result Date: 02/13/2020 CLINICAL DATA:  Shortness of breath. EXAM: CHEST - 2 VIEW COMPARISON:  02/11/2020. FINDINGS: Cardiomegaly with mild pulmonary venous congestion and bilateral interstitial prominence suggesting mild CHF. Pneumonitis can not be excluded. Low lung volumes with right base atelectasis and or infiltrate. No pleural effusion or pneumothorax. Diffuse osteopenia degenerative change. IMPRESSION: 1. Cardiomegaly with mild pulmonary venous congestion and bilateral interstitial  prominence suggesting mild CHF. Pneumonitis can not be excluded. 2.  Low lung volumes with right base atelectasis/infiltrate. Electronically Signed   By: Marcello Moores  Register   On: 02/13/2020 14:43    EKG: Independently reviewed. sinus rhythm with frequent PVCs and occasional PACs.  Assessment/Plan Principal Problem:   Acute exacerbation of CHF (congestive heart failure) ([place])  Active Problems:   CKD (chronic kidney disease), stage IV (HCC)   Essential hypertension   Gout   Gastroesophageal reflux disease without esophagitis  CHFrEF Exacerbation > Last echo 05/09/2018 with EF 35-40% > Progressive dyspnea, edema, DOE x1 week - Echocardiogram - Received 1 dose of 60 mg IV Lasix in ED, will continue this twice daily - Continue home Coreg - Daily weights, strict I/Os - Follow BMP  CKD 4 > Creatinine 4.98 baseline around 4.5 - Avoid nephrotoxic medications - Trend renal function  GERD - Continue PPI  Gout -Continue home allopurinol  DVT prophylaxis: Lovenox  Code Status:   Full  Family Communication:  Discussed with son at bedside  Disposition Plan:   Patient is from:  Home  Anticipated DC to:  Home  Anticipated DC date:  Pending clinical course    Consults called:  None  Admission status:  Inpatient, telemetry   Severity of Illness: The appropriate patient status for this patient is INPATIENT. Inpatient status is judged to be reasonable and necessary in order to provide the required intensity of service to ensure the patient's safety. The patient's presenting symptoms, physical exam findings, and initial radiographic and laboratory data in the context of their chronic comorbidities is felt to place them at high risk for further clinical deterioration. Furthermore, it is not anticipated that the patient will be medically stable for discharge from the hospital within 2 midnights of admission. The following factors support the patient status of inpatient.   " The patient's  presenting symptoms include dyspnea, fall. " The worrisome physical exam findings consistent with volume overload. " The initial radiographic and laboratory data are worrisome because of pulmonary edema and elevated BNP. " The chronic co-morbidities include CKD, GERD.   * I certify that at the point of admission it is my clinical judgment that the patient will require inpatient hospital care spanning beyond 2 midnights from the point of admission due to high intensity of service, high risk for further deterioration and high frequency of surveillance required.Marcelyn Bruins MD [facility]  How to contact the [facility] Attending or Consulting provider [place] or covering provider during after hours [place], for this patient?   1. Check the care team in [facility] and look for a) attending/consulting TR[facility] provider listed and b) the [facility] team listed 2. Log into [email or web address] and use [place]'s universal password to access. If you do not have the password, please contact the hospital operator. 3. Locate the [facility] provider you are looking for under [facility] and page to a number that you can be directly reached. 4. If you still have difficulty reaching the provider, please page the [facility] (Director on Call) for the Hospitalists listed on amion for assistance.  02/14/2020, 12:03 AM

## 2020-02-13 NOTE — ED Triage Notes (Signed)
Pt endorses SOB and right leg swelling. States she was having trouble walking last night due to the pain. Hx of CHF.  Reports falling last Thursday and has bruise to right upper arm

## 2020-02-13 NOTE — ED Provider Notes (Signed)
[facility] EMERGENCY DEPARTMENT Provider Note   CSN: [id number] Arrival date & time: 02/13/20  1401     History Chief Complaint  Patient presents with  . Leg Swelling  . Shortness of Breath    Doris Howell is a 84 y.o. female with past medical history significant for diastolic CHF, GERD, hypertension, type 2 diabetes with stage IV CKD.  Takes aspirin daily.  HPI Patient presents to emergency department with a chief complaint of shortness of breath and leg swelling x 1 week.  Patient states she has shortness of breath all the time because of her CHF.  She did notice over the last week it has progressively worsened along with the swelling in bilateral legs.  She is also endorsing aching pain in both legs.  She is unable to ambulate with her walker as easily as she usually does because of the pain.  She rates the pain 6 out of 10 in severity.  She reports compliance with her home medications, denies any over-the-counter medications for symptoms prior to arrival.  Patient states she gets out of breath with even the easiest tasks such as getting dressed or walking to the bathroom.  She denies any infectious symptoms including fever, chills, congestion, cough.  Also denies chest pain, syncope, palpitations, back pain, hematuria, urinary frequency or dysuria, numbness, weakness or tingling.  Patient states that she had a fall x1 week ago in the kitchen.  She lost her footing and fell on her right side.  She hit her right arm and hip on the counter.  She denies hitting her head or loss of consciousness.  Her husband is able to help her get up.  She noted she had bruises on her arm and hip after the fall.   PCP is Dr. Lavone Orn.    Past Medical History:  Diagnosis Date  . CAP (community acquired pneumonia) 01/09/2014   "first time I've ever had it"  . Diastolic congestive heart failure ([place])   . GERD (gastroesophageal reflux disease)   . High cholesterol   .  History of gout   . Hypertension   . Kidney stones   . Type II diabetes mellitus with stage 4 chronic kidney disease [facility])     Patient Active Problem List   Diagnosis Date Noted  . Influenza A 06/19/2018  . Acute pulmonary edema ([place]) 06/18/2018  . Fracture of phalanx of finger 02/15/2018  . Pain in right hand 01/04/2018  . Gastroesophageal reflux disease without esophagitis 06/14/2017  . Globus pharyngeus 06/14/2017  . Hoarseness 06/14/2017  . Pulmonary hypertension ([place]) 10/13/2016  . Left bundle branch block 12/22/2014  . Renal failure, acute on chronic (HCC)   . Pleural effusion on right   . Lung infiltrate   . Acute respiratory failure, unspecified whether with hypoxia or hypercapnia ([place])   . CHF exacerbation ([place])   . Acute on chronic renal failure ([place])   . Acute exacerbation of CHF (congestive heart failure) ([place]) 08/28/2014  . Acute diastolic CHF (congestive heart failure) ([place]) 08/28/2014  . Chronic kidney disease (CKD), stage IV (severe) ([place])   . Acute on chronic combined systolic and diastolic CHF (congestive heart failure) ([place])   . Sleep apnea 07/29/2014  . Obesity (BMI 37) 04/14/2014  . Bradycardia- beta blocker decreased 04/14/2014  . Pulmonary infiltrates   . Diastolic CHF, acute on chronic (HCC) 04/11/2014  . Type 2 diabetes mellitus with stage 4 chronic kidney disease ([place]) 04/11/2014  . Essential hypertension  04/11/2014  . Gout 04/11/2014  . HCAP (healthcare-associated pneumonia)   . Hyperkalemia 02/08/2014  . Chronic diastolic heart failure ([place]) 02/05/2014  . CKD (chronic kidney disease), stage IV ([place]) 02/05/2014  . CAP (community acquired pneumonia) 01/09/2014  . Acute respiratory failure with hypoxia ([place]) 01/09/2014  . Anemia 01/09/2014  . AKI (acute kidney injury) ([place]) 01/09/2014    Past Surgical History:  Procedure Laterality Date  . ABDOMINAL HYSTERECTOMY  ~ 1974  . CATARACT EXTRACTION W/ INTRAOCULAR LENS  IMPLANT, BILATERAL Bilateral  2014  . COLONOSCOPY WITH PROPOFOL N/A 10/23/2012   Procedure: COLONOSCOPY WITH PROPOFOL;  Surgeon: [name], MD;  Location: [facility] ENDOSCOPY;  Service: Endoscopy;  Laterality: N/A;  . KIDNEY STONE SURGERY  10-05-12   "cut me on the side"  . LITHOTRIPSY  1980's?  . RIGHT HEART CATHETERIZATION N/A 04/15/2014   Procedure: RIGHT HEART CATH;  Surgeon: Sanda Klein, MD;  Location: [facility] CATH LAB;  Service: Cardiovascular;  Laterality: N/A;     OB History   No obstetric history on file.     Family History  Problem Relation Age of Onset  . Hypertension Mother   . Diabetes Brother   . CAD Neg Hx     Social History   Tobacco Use  . Smoking status: Never Smoker  . Smokeless tobacco: Never Used  Vaping Use  . Vaping Use: Never used  Substance Use Topics  . Alcohol use: No  . Drug use: No    Home Medications Prior to Admission medications   Medication Sig Start Date End Date Taking? Authorizing Provider  Acetaminophen (TYLENOL ARTHRITIS PAIN PO) Take 2 tablets by mouth daily as needed (pain).   Yes [provider]  albuterol (PROVENTIL HFA;VENTOLIN HFA) 108 (90 BASE) MCG/ACT inhaler Inhale 2 puffs into the lungs every 6 (six) hours as needed for wheezing or shortness of breath. 01/13/14  Yes [name], MD  allopurinol (ZYLOPRIM) 100 MG tablet Take 100 mg by mouth 2 (two) times daily.  06/28/19  Yes [provider]  aspirin EC 81 MG tablet Take 81 mg by mouth daily.   Yes [provider]  carvedilol (COREG) 6.25 MG tablet Take 6.25 mg by mouth 2 (two) times daily with a meal.   Yes [provider]  esomeprazole (NEXIUM) 40 MG capsule Take 40 mg by mouth daily at 12 noon.   Yes [provider]  hydrALAZINE (APRESOLINE) 25 MG tablet Take 25 mg by mouth 2 (two) times daily.    Yes [provider]  latanoprost (XALATAN) 0.005 % ophthalmic solution Place 1 drop into both eyes at bedtime.   Yes [provider]  nitroGLYCERIN  (NITROSTAT) 0.4 MG SL tablet Place 1 tablet (0.4 mg total) under the tongue every 5 (five) minutes as needed for chest pain. 04/24/18  Yes Nahser, Wonda Cheng, MD  torsemide (DEMADEX) 20 MG tablet Take 1-2 tablets (20-40 mg total) by mouth 2 (two) times daily. Take 2 tabs daily at 8 AM and 1 tab daily after lunch at 1 PM. Patient taking differently: Take 80 mg by mouth daily.  09/11/14  Yes Hongalgi, Lenis Dickinson, MD  Vitamin D, Cholecalciferol, 400 UNITS CAPS Take 2 capsules by mouth daily.   Yes [provider]    Allergies    Other, Simvastatin, and Codeine  Review of Systems   Review of Systems  All other systems are reviewed and are negative for acute change except as noted in the HPI.   Physical  Exam Updated Vital Signs BP 122/67   Temp 97.7 F (36.5 C) (Oral)   Resp 20   Ht 5\' 1"  (1.549 m)   Wt 64.9 kg   SpO2 100%   BMI 27.02 kg/m   Physical Exam Vitals and nursing note reviewed.  Constitutional:      General: She is not in acute distress.    Appearance: She is not ill-appearing.  HENT:     Head: Normocephalic and atraumatic.     Comments: No tenderness to palpation of skull. No deformities or crepitus noted. No open wounds, abrasions or lacerations.    Right Ear: Tympanic membrane and external ear normal.     Left Ear: Tympanic membrane and external ear normal.     Nose: Nose normal.     Mouth/Throat:     Mouth: Mucous membranes are moist.     Pharynx: Oropharynx is clear.  Eyes:     General: No scleral icterus.       Right eye: No discharge.        Left eye: No discharge.     Extraocular Movements: Extraocular movements intact.     Conjunctiva/sclera: Conjunctivae normal.     Pupils: Pupils are equal, round, and reactive to light.  Neck:     Vascular: No JVD.     Comments: Full ROM intact without spinous process TTP. No bony stepoffs or deformities, no paraspinous muscle TTP or muscle spasms. No rigidity or meningeal signs. No bruising, erythema, or  swelling.  Cardiovascular:     Rate and Rhythm: Normal rate and regular rhythm.     Pulses: Normal pulses.          Radial pulses are 2+ on the right side and 2+ on the left side.       Dorsalis pedis pulses are 2+ on the right side and 2+ on the left side.     Heart sounds: Normal heart sounds.  Pulmonary:     Comments: Lung sounds diminished throughout.  Respiratory rate in the high 20s during exam.  Symmetric chest rise. No wheezing, rales, or rhonchi. Abdominal:     Comments: Abdomen is soft, non-distended, and non-tender in all quadrants. No rigidity, no guarding. No peritoneal signs.  Musculoskeletal:        General: Normal range of motion.     Cervical back: Normal range of motion.     Comments: 2+ bilateral lower extremity edema extending to mid thigh  Full range of motion of the T-spine and L-spine No tenderness to palpation of the spinous processes of the T-spine or L-spine No crepitus, deformity or step-offs No tenderness to palpation of the paraspinous muscles of the L-spine    No cervical, thoracic, or lumbar spinal tenderness to palpation. No paraspinal tenderness. No step offs, crepitus or deformity palpated.   Bruise on right bicep, right flank and right hip that are tender to palpation.  No open wounds.  Full range of motion of right upper extremity and right lower extremity.  Skin:    General: Skin is warm and dry.     Capillary Refill: Capillary refill takes less than 2 seconds.     Comments: Equal tactile temperature in all extremities  Neurological:     Mental Status: She is oriented to person, place, and time.     GCS: GCS eye subscore is 4. GCS verbal subscore is 5. GCS motor subscore is 6.     Comments: Fluent speech, no facial droop.  Psychiatric:  Behavior: Behavior normal.     ED Results / Procedures / Treatments   Labs (all labs ordered are listed, but only abnormal results are displayed) Labs Reviewed  BASIC METABOLIC PANEL - Abnormal;  Notable for the following components:      Result Value   CO2 21 (*)    Glucose, Bld 109 (*)    BUN 103 (*)    Creatinine, Ser 4.98 (*)    Calcium 8.5 (*)    GFR, Estimated 8 (*)    Anion gap 18 (*)    All other components within normal limits  CBC - Abnormal; Notable for the following components:   WBC 3.4 (*)    RBC 3.55 (*)    Hemoglobin 11.0 (*)    MCV 103.9 (*)    MCHC 29.8 (*)    RDW 16.5 (*)    Platelets 121 (*)    All other components within normal limits  BRAIN NATRIURETIC PEPTIDE - Abnormal; Notable for the following components:   B Natriuretic Peptide 3,370.2 (*)    All other components within normal limits  CBG MONITORING, ED - Abnormal; Notable for the following components:   Glucose-Capillary 102 (*)    All other components within normal limits  RESP PANEL BY RT PCR (RSV, FLU A&B, COVID)  URINALYSIS, ROUTINE W REFLEX MICROSCOPIC    EKG EKG Interpretation  Date/Time:  Thursday February 13 2020 14:06:51 EDT Ventricular Rate:  67 PR Interval:  140 QRS Duration: 122 QT Interval:  444 QTC Calculation: 469 R Axis:   136 Text Interpretation: Sinus rhythm with frequent Premature ventricular complexes and Premature atrial complexes Left bundle branch block Non-specific ST-t changes Confirmed by Lajean Saver [phone]) on 02/13/2020 3:54:05 PM   Radiology CT ABDOMEN PELVIS WO CONTRAST  Result Date: 02/13/2020 CLINICAL DATA:  Recent fall with flank bruising, initial encounter EXAM: CT ABDOMEN AND PELVIS WITHOUT CONTRAST TECHNIQUE: Multidetector CT imaging of the abdomen and pelvis was performed following the standard protocol without IV contrast. COMPARISON:  None. FINDINGS: Lower chest: Small pleural effusions are noted bilaterally right greater than left. Mild bibasilar atelectasis is seen. Cardiomegaly is noted. Hepatobiliary: No focal liver abnormality is seen. No gallstones, gallbladder wall thickening, or biliary dilatation. Pancreas: Unremarkable. No pancreatic  ductal dilatation or surrounding inflammatory changes. Spleen: Normal in size without focal abnormality. Adrenals/Urinary Tract: Adrenal glands are within normal limits. Right kidney shows no obstructive or inflammatory changes. The left kidney has been surgically removed. Dystrophic calcifications in the surgical bed are noted. Bladder is well distended. Stomach/Bowel: Scattered diverticular change of the colon is noted without evidence of diverticulitis. No obstructive changes are seen. Stomach and small bowel are within normal limits. Vascular/Lymphatic: Aortic atherosclerosis. No enlarged abdominal or pelvic lymph nodes. Reproductive: Status post hysterectomy. No adnexal masses. Other: Minimal ascites is noted. Small ventral hernia containing fluid and fat is noted anterior to the liver. Mild changes of anasarca are noted. Musculoskeletal: Degenerative changes of the thoracolumbar spine are noted. No acute bony abnormality is seen. Increased soft tissue density is noted in the left flank consistent with the given clinical history of bruising. No definitive underlying rib fracture is seen. IMPRESSION: Soft tissue changes consistent with the recent fall and flank bruising. No rib fractures are no visceral injury is seen. Status post left nephrectomy. Diverticulosis without diverticulitis. Small anterior abdominal wall hernia containing fat and fluid. Mild ascites and small bilateral effusions. Mild changes of anasarca are noted as well. Stable cardiomegaly. Electronically Signed   By:  Inez Catalina M.D.   On: 02/13/2020 18:38   DG Chest 2 View  Result Date: 02/13/2020 CLINICAL DATA:  Shortness of breath. EXAM: CHEST - 2 VIEW COMPARISON:  02/11/2020. FINDINGS: Cardiomegaly with mild pulmonary venous congestion and bilateral interstitial prominence suggesting mild CHF. Pneumonitis can not be excluded. Low lung volumes with right base atelectasis and or infiltrate. No pleural effusion or pneumothorax. Diffuse  osteopenia degenerative change. IMPRESSION: 1. Cardiomegaly with mild pulmonary venous congestion and bilateral interstitial prominence suggesting mild CHF. Pneumonitis can not be excluded. 2.  Low lung volumes with right base atelectasis/infiltrate. Electronically Signed   By: Marcello Moores  Register   On: 02/13/2020 14:43    Procedures Procedures (including critical care time)  Medications Ordered in ED Medications  furosemide (LASIX) injection 60 mg (60 mg Intravenous Given 02/13/20 1703)    ED Course  I have reviewed the triage vital signs and the nursing notes.  Pertinent labs & imaging results that were available during my care of the patient were reviewed by me and considered in my medical decision making (see chart for details).    MDM Rules/Calculators/A&P                          History provided by patient with additional history obtained from chart review.    84 year old female presenting with shortness of breath and leg swelling.  She is afebrile, HDS.  She is nontoxic-appearing.  During exam respiratory rate is in the mid 20s.  Lung sounds are diminished throughout, no wheezing heard.  She is able to speak in full sentences, no accessory muscle use.  Chest and abdomen are nontender to palpation.  She has bruising noted to her right bicep, right flank and right hip.  Full range of motion of all extremities.  No obvious deformities.  Compartments in all extremities are soft.  Also has bilateral 2+ pitting edema extending to mid thigh.  Labs were collected in triage.  I viewed results which show no leukocytosis, she does have slightly low bicarb of 21 and elevated anion gap of 18.  BUN/creatinine today is 103/4.98, this is slightly up from her baseline.  CBC with leukopenia with a white count of 3.4, hemoglobin 11 is consistent with baseline, thrombocytopenia 121, she has history of similar when looking at previous labs.  BNP is elevated at 3000, 370.  I viewed chest x-ray which shows  cardiomegaly and mild pulmonary venous congestion bilateral interstitial prominence.  X-ray findings and her physical exam are consistent with CHF exacerbation. EKG without ischemic changes. Lasix given. CT AP obtained without contrast 2/2 kidney function because of recent fall and flank ecchymosis shows no rib fractures and no visceral injury. This case was discussed with Dr. Ashok Cordia who has seen the patient and agrees with plan to admit for CHF exacerbation Spoke with Dr. Trilby Drummer with hospitalist service who agrees to assume care of patient and bring into the hospital for further evaluation and management.     Portions of this note were generated with [employer]. Dictation errors may occur despite best attempts at proofreading.  Final Clinical Impression(s) / ED Diagnoses Final diagnoses:  Acute on chronic congestive heart failure, unspecified heart failure type [facility])    Rx / DC Orders ED Discharge Orders    None       Lewanda Rife 02/13/20 1907    Lajean Saver, MD 02/13/20 2313

## 2020-02-13 NOTE — ED Notes (Signed)
Contacted MD about Heart Rhythm

## 2020-02-13 NOTE — ED Notes (Signed)
Received pt from triage, assisted onto stretcher- has pitting edema from toes to thighs- states she has been taking her torsemide.  Has bruising to right upper arm, right flank area, right hip area- states she fell "the other day" -  Spoke with grandson and niece on speaker phone to make them aware of pt's condition and visitor policy. Yolanda Bonine will come to room for now.

## 2020-02-14 ENCOUNTER — Inpatient Hospital Stay (HOSPITAL_COMMUNITY): Payer: Medicare Other

## 2020-02-14 DIAGNOSIS — I340 Nonrheumatic mitral (valve) insufficiency: Secondary | ICD-10-CM

## 2020-02-14 DIAGNOSIS — I361 Nonrheumatic tricuspid (valve) insufficiency: Secondary | ICD-10-CM

## 2020-02-14 DIAGNOSIS — R0602 Shortness of breath: Secondary | ICD-10-CM | POA: Diagnosis not present

## 2020-02-14 DIAGNOSIS — I5020 Unspecified systolic (congestive) heart failure: Secondary | ICD-10-CM

## 2020-02-14 DIAGNOSIS — M1040 Other secondary gout, unspecified site: Secondary | ICD-10-CM

## 2020-02-14 DIAGNOSIS — K219 Gastro-esophageal reflux disease without esophagitis: Secondary | ICD-10-CM

## 2020-02-14 DIAGNOSIS — N184 Chronic kidney disease, stage 4 (severe): Secondary | ICD-10-CM | POA: Diagnosis not present

## 2020-02-14 LAB — CBC
HCT: 34.7 % — ABNORMAL LOW (ref 36.0–46.0)
Hemoglobin: 10.7 g/dL — ABNORMAL LOW (ref 12.0–15.0)
MCH: 32.3 pg (ref 26.0–34.0)
MCHC: 30.8 g/dL (ref 30.0–36.0)
MCV: 104.8 fL — ABNORMAL HIGH (ref 80.0–100.0)
Platelets: 126 10*3/uL — ABNORMAL LOW (ref 150–400)
RBC: 3.31 MIL/uL — ABNORMAL LOW (ref 3.87–5.11)
RDW: 16.7 % — ABNORMAL HIGH (ref 11.5–15.5)
WBC: 3.6 10*3/uL — ABNORMAL LOW (ref 4.0–10.5)
nRBC: 0 % (ref 0.0–0.2)

## 2020-02-14 LAB — RETICULOCYTES
Immature Retic Fract: 5.3 % (ref 2.3–15.9)
RBC.: 3.54 MIL/uL — ABNORMAL LOW (ref 3.87–5.11)
Retic Count, Absolute: 63.4 10*3/uL (ref 19.0–186.0)
Retic Ct Pct: 1.8 % (ref 0.4–3.1)

## 2020-02-14 LAB — FERRITIN: Ferritin: 193 ng/mL (ref 11–307)

## 2020-02-14 LAB — BASIC METABOLIC PANEL
Anion gap: 14 (ref 5–15)
BUN: 104 mg/dL — ABNORMAL HIGH (ref 8–23)
CO2: 22 mmol/L (ref 22–32)
Calcium: 8.5 mg/dL — ABNORMAL LOW (ref 8.9–10.3)
Chloride: 109 mmol/L (ref 98–111)
Creatinine, Ser: 4.99 mg/dL — ABNORMAL HIGH (ref 0.44–1.00)
GFR, Estimated: 8 mL/min — ABNORMAL LOW (ref >60)
Glucose, Bld: 207 mg/dL — ABNORMAL HIGH (ref 70–99)
Potassium: 3.4 mmol/L — ABNORMAL LOW (ref 3.5–5.1)
Sodium: 145 mmol/L (ref 135–145)

## 2020-02-14 LAB — ECHOCARDIOGRAM COMPLETE
Area-P 1/2: 3.2 cm2
Height: 61 in
S' Lateral: 4.59 cm
Weight: 2288 oz

## 2020-02-14 LAB — FOLATE: Folate: 21.7 ng/mL (ref >5.9)

## 2020-02-14 LAB — MAGNESIUM: Magnesium: 2.1 mg/dL (ref 1.7–2.4)

## 2020-02-14 LAB — VITAMIN B12: Vitamin B-12: 531 pg/mL (ref 180–914)

## 2020-02-14 LAB — TSH: TSH: 2.609 u[IU]/mL (ref 0.350–4.500)

## 2020-02-14 LAB — IRON AND TIBC
Iron: 68 ug/dL (ref 28–170)
Saturation Ratios: 24 % (ref 10.4–31.8)
TIBC: 283 ug/dL (ref 250–450)
UIBC: 215 ug/dL

## 2020-02-14 LAB — T4, FREE: Free T4: 0.84 ng/dL (ref 0.61–1.12)

## 2020-02-14 MED ORDER — POTASSIUM CHLORIDE CRYS ER 20 MEQ PO TBCR
40.0000 meq | EXTENDED_RELEASE_TABLET | Freq: Two times a day (BID) | ORAL | Status: AC
  Administered 2020-02-14 (×2): 40 meq via ORAL
  Filled 2020-02-14 (×3): qty 2

## 2020-02-14 MED ORDER — HEPARIN SODIUM (PORCINE) 5000 UNIT/ML IJ SOLN
5000.0000 [IU] | Freq: Three times a day (TID) | INTRAMUSCULAR | Status: DC
  Administered 2020-02-14 – 2020-02-18 (×11): 5000 [IU] via SUBCUTANEOUS
  Filled 2020-02-14 (×11): qty 1

## 2020-02-14 NOTE — ED Notes (Signed)
Lunch Tray Ordered @ 1021. 

## 2020-02-14 NOTE — Progress Notes (Signed)
Made attempt to pivot patient from ED stretcher to bed in room 4E22 with ED RN and Luetta Nutting, RN, however pt became very anxious due to frequent falls at home and was afraid of falling again. This NT and ED RN sat pt back in the bed due to pt not wanting/being too afraid to hold her own weight and RN supporting pt's weight, and instead laterally transfered pt from stretcher to bed. Pt tolerated well. Cherene Julian, NT.

## 2020-02-14 NOTE — Progress Notes (Signed)
  Echocardiogram 2D Echocardiogram has been performed.  Brita Jurgensen G Clydette Privitera 02/14/2020, 3:40 PM

## 2020-02-14 NOTE — Progress Notes (Addendum)
PROGRESS NOTE    Doris Howell  AOZ:[id number] DOB: [birth year] DOA: 02/13/2020  PCP: Lavone Orn, MD    Brief Narrative:  Pt is a full code 84 y./o women admitted on 02/13/2020 for SOB and LE edema. Pt states she does not and her heart  md dr told her heart heart is. Good/ I d/w that it is critical that she stay off sodium. She has a  past medical history of CHF/CKD/P[place]mon[place] htn and was admitted for her chf.  Assessment & Plan: Acute on chronic  exacerbation of systolic CHF (congestive heart failure) ([place]): Pt has echo that showed about 40 # EF and we will repeat echo dn also cardiology has been consulted.Stric I/t.Monitor electrollytes and replace.Cont home regimen of meds as follows: asa/statin/ ace or arb once euvolemic. No intake or output data in the 24 hours ending 02/14/20 1523 Have changed lasix to 60 mg q 12h for today and we will reassess and redose in am.  I suspect pt will need at lead  another 24-48 hours of hospital monitoring for her A/C on C/H systolic CHF with reduced EF and echo is pending.  SpO2: 96 % on RA and heart rate has been bradycardic, and cardiology has been consulted.  Bradycardia: Monitoring on telemetry . TFT's. Electrolyte checks.     CKD (chronic kidney disease), stage IV (HCC) Lab Results  Component Value Date   CREATININE 4.99 (H) 02/14/2020   CREATININE 4.98 (H) 02/13/2020   CREATININE 4.51 (H) 01/16/2020  Avoid nephrotoxic agents and renall dose meds. Change Lovenox to heparin .   Essential hypertension Cont coreg. Cardiac low sodium diet.   Gout cont allopurinol for hyperuricemia from ckd and gout.   Anemia: Anemia present since 2008 in our charts: Suspect ACD will collect anemia panel . Type/cross/transfues if hb is 8 or below.     DVT prophylaxis: Heparin    Code Status: Full Code  Family Communication: None at bedside. Disposition Plan: TBD   Status is: Inpatient  Remains inpatient appropriate  because:Inpatient level of care appropriate due to severity of illness   Dispo: The patient is from: Home              Anticipated d/c is to: Home              Anticipated d/c date is: 2 days              Patient currently is not medically stable to d/c. Consultants:  Cardiology consult.  Procedures:  2 d echocardigram.  Subjective: Pt admitted and is day 1 today she is stable and converses. Pt Denis any complaint otherwise.  Pt was stable able to finish her sentences, she states she is complaint with diet.   Objective: Vitals:   02/14/20 1200 02/14/20 1300 02/14/20 1400 02/14/20 1500  BP: (!) 133/52 (!) 126/94 (!) 130/53 (!) 141/64  Pulse: (!) 51 (!) 58 75 (!) 43  Resp: 19 15 (!) 22 (!) 21  Temp:      TempSrc:      SpO2: 98% 100% 95% 96%  Weight:      Height:       SpO2: 96 % No intake or output data in the 24 hours ending 02/14/20 1523 Filed Weights   02/13/20 1407  Weight: 64.9 kg    Examination: Blood pressure (!) 141/64, pulse (!) 43, temperature 97.7 F (36.5 C), temperature source Oral, resp. rate (!) 21, height 5\' 1"  (1.549 m), weight 64.9 kg, SpO2 96 %.  General exam: Appears calm and comfortable  HEENT:EOMI, perrl  Respiratory system: Basilar crackles , Respiratory effort normal. Cardiovascular system: regular with occ irregular breat.  ,JVD difficult to assess ,  1+pedal edema BL. Gastrointestinal system: Abdomen is obese, nondistended, soft and nontender. No organomegaly or masses felt. Normal bowel sounds heard. Central nervous system: Alert and oriented.CN grossly intact No focal neurological deficits. Extremities: pt moving all 4 ext and ambulating. Skin: No rashes, lesions or ulcers Psychiatry: Judgement and insight appear normal. Mood & affect appropriate.   Data Reviewed: I have personally reviewed following labs and imaging studies  No intake/output data recorded. No intake/output data recorded. Lab Results  Component Value Date   CREATININE  4.99 (H) 02/14/2020   CREATININE 4.98 (H) 02/13/2020   CREATININE 4.51 (H) 01/16/2020   CBC: Recent Labs  Lab 02/13/20 1414 02/14/20 0213  WBC 3.4* 3.6*  HGB 11.0* 10.7*  HCT 36.9 34.7*  MCV 103.9* 104.8*  PLT 121* 601*   Basic Metabolic Panel: Recent Labs  Lab 02/13/20 1414 02/13/20 1920 02/14/20 0213  NA 144  --  145  K 3.5  --  3.4*  CL 105  --  109  CO2 21*  --  22  GLUCOSE 109*  --  207*  BUN 103*  --  104*  CREATININE 4.98*  --  4.99*  CALCIUM 8.5*  --  8.5*  MG  --  2.0  --    GFR: Estimated Creatinine Clearance: 7.5 mL/min (A) (by C-G formula based on SCr of 4.99 mg/dL (H)). Liver Function Tests: No results for input(s): AST, ALT, ALKPHOS, BILITOT, PROT, ALBUMIN in the last 168 hours. No results for input(s): LIPASE, AMYLASE in the last 168 hours. No results for input(s): AMMONIA in the last 168 hours.  Coagulation Profile: No results for input(s): INR, PROTIME in the last 168 hours. Cardiac Enzymes: No results for input(s): CKTOTAL, CKMB, CKMBINDEX, TROPONINI in the last 168 hours. BNP (last 3 results) No results for input(s): PROBNP in the last 8760 hours. HbA1C: No results for input(s): HGBA1C in the last 72 hours. CBG: Recent Labs  Lab 02/13/20 1412  GLUCAP 102*   Lipid Profile: No results for input(s): CHOL, HDL, LDLCALC, TRIG, CHOLHDL, LDLDIRECT in the last 72 hours. Thyroid Function Tests: No results for input(s): TSH, T4TOTAL, FREET4, T3FREE, THYROIDAB in the last 72 hours. Anemia Panel: No results for input(s): VITAMINB12, FOLATE, FERRITIN, TIBC, IRON, RETICCTPCT in the last 72 hours. Sepsis Labs: No results for input(s): PROCALCITON, LATICACIDVEN in the last 168 hours.  Recent Results (from the past 240 hour(s))  Resp Panel by RT PCR (RSV, Flu A&B, Covid) - Nasopharyngeal Swab     Status: None   Collection Time: 02/13/20  3:45 PM   Specimen: Nasopharyngeal Swab  Result Value Ref Range Status   SARS Coronavirus 2 by RT PCR NEGATIVE  NEGATIVE Final    Comment: (NOTE) SARS-CoV-2 target nucleic acids are NOT DETECTED.  The SARS-CoV-2 RNA is generally detectable in upper respiratoy specimens during the acute phase of infection. The lowest concentration of SARS-CoV-2 viral copies this assay can detect is 131 copies/mL. A negative result does not preclude SARS-Cov-2 infection and should not be used as the sole basis for treatment or other patient management decisions. A negative result may occur with  improper specimen collection/handling, submission of specimen other than nasopharyngeal swab, presence of viral mutation(s) within the areas targeted by this assay, and inadequate number of viral copies (<131 copies/mL). A negative result must  be combined with clinical observations, patient history, and epidemiological information. The expected result is Negative.  Fact Sheet for Patients:  PinkCheek.be  Fact Sheet for Healthcare Providers:  GravelBags.it  This test is no t yet approved or cleared by the [place] FDA and  has been authorized for detection and/or diagnosis of SARS-CoV-2 by FDA under an Emergency Use Authorization (EUA). This EUA will remain  in effect (meaning this test can be used) for the duration of the COVID-19 declaration under Section 564(b)(1) of the Act, 21 U.S.C. section 360bbb-3(b)(1), unless the authorization is terminated or revoked sooner.     Influenza A by PCR NEGATIVE NEGATIVE Final   Influenza B by PCR NEGATIVE NEGATIVE Final    Comment: (NOTE) The Xpert Xpress SARS-CoV-2/FLU/RSV assay is intended as an aid in  the diagnosis of influenza from Nasopharyngeal swab specimens and  should not be used as a sole basis for treatment. Nasal washings and  aspirates are unacceptable for Xpert Xpress SARS-CoV-2/FLU/RSV  testing.  Fact Sheet for Patients: PinkCheek.be  Fact Sheet for Healthcare  Providers: GravelBags.it  This test is not yet approved or cleared by the [place] FDA and  has been authorized for detection and/or diagnosis of SARS-CoV-2 by  FDA under an Emergency Use Authorization (EUA). This EUA will remain  in effect (meaning this test can be used) for the duration of the  Covid-19 declaration under Section 564(b)(1) of the Act, 21  U.S.C. section 360bbb-3(b)(1), unless the authorization is  terminated or revoked.    Respiratory Syncytial Virus by PCR NEGATIVE NEGATIVE Final    Comment: (NOTE) Fact Sheet for Patients: PinkCheek.be  Fact Sheet for Healthcare Providers: GravelBags.it  This test is not yet approved or cleared by the [place] FDA and  has been authorized for detection and/or diagnosis of SARS-CoV-2 by  FDA under an Emergency Use Authorization (EUA). This EUA will remain  in effect (meaning this test can be used) for the duration of the  COVID-19 declaration under Section 564(b)(1) of the Act, 21 U.S.C.  section 360bbb-3(b)(1), unless the authorization is terminated or  revoked. Performed at [facility], [place] [address], [place], [place] [address]       Radiology Studies: CT ABDOMEN PELVIS WO CONTRAST  Result Date: 02/13/2020 CLINICAL DATA:  Recent fall with flank bruising, initial encounter EXAM: CT ABDOMEN AND PELVIS WITHOUT CONTRAST TECHNIQUE: Multidetector CT imaging of the abdomen and pelvis was performed following the standard protocol without IV contrast. COMPARISON:  None. FINDINGS: Lower chest: Small pleural effusions are noted bilaterally right greater than left. Mild bibasilar atelectasis is seen. Cardiomegaly is noted. Hepatobiliary: No focal liver abnormality is seen. No gallstones, gallbladder wall thickening, or biliary dilatation. Pancreas: Unremarkable. No pancreatic ductal dilatation or surrounding inflammatory changes. Spleen:  Normal in size without focal abnormality. Adrenals/Urinary Tract: Adrenal glands are within normal limits. Right kidney shows no obstructive or inflammatory changes. The left kidney has been surgically removed. Dystrophic calcifications in the surgical bed are noted. Bladder is well distended. Stomach/Bowel: Scattered diverticular change of the colon is noted without evidence of diverticulitis. No obstructive changes are seen. Stomach and small bowel are within normal limits. Vascular/Lymphatic: Aortic atherosclerosis. No enlarged abdominal or pelvic lymph nodes. Reproductive: Status post hysterectomy. No adnexal masses. Other: Minimal ascites is noted. Small ventral hernia containing fluid and fat is noted anterior to the liver. Mild changes of anasarca are noted. Musculoskeletal: Degenerative changes of the thoracolumbar spine are noted. No acute bony abnormality is  seen. Increased soft tissue density is noted in the left flank consistent with the given clinical history of bruising. No definitive underlying rib fracture is seen. IMPRESSION: Soft tissue changes consistent with the recent fall and flank bruising. No rib fractures are no visceral injury is seen. Status post left nephrectomy. Diverticulosis without diverticulitis. Small anterior abdominal wall hernia containing fat and fluid. Mild ascites and small bilateral effusions. Mild changes of anasarca are noted as well. Stable cardiomegaly. Electronically Signed   By: Inez Catalina M.D.   On: 02/13/2020 18:38   DG Chest 2 View  Result Date: 02/13/2020 CLINICAL DATA:  Shortness of breath. EXAM: CHEST - 2 VIEW COMPARISON:  02/11/2020. FINDINGS: Cardiomegaly with mild pulmonary venous congestion and bilateral interstitial prominence suggesting mild CHF. Pneumonitis can not be excluded. Low lung volumes with right base atelectasis and or infiltrate. No pleural effusion or pneumothorax. Diffuse osteopenia degenerative change. IMPRESSION: 1. Cardiomegaly with  mild pulmonary venous congestion and bilateral interstitial prominence suggesting mild CHF. Pneumonitis can not be excluded. 2.  Low lung volumes with right base atelectasis/infiltrate. Electronically Signed   By: Marcello Moores  Register   On: 02/13/2020 14:43   Current Facility-Administered Medications (Cardiovascular):  .  carvedilol (COREG) tablet 6.25 mg  Current Outpatient Medications (Cardiovascular):  .  carvedilol (COREG) 6.25 MG tablet, Take 6.25 mg by mouth 2 (two) times daily with a meal. .  hydrALAZINE (APRESOLINE) 25 MG tablet, Take 25 mg by mouth 2 (two) times daily.  .  nitroGLYCERIN (NITROSTAT) 0.4 MG SL tablet, Place 1 tablet (0.4 mg total) under the tongue every 5 (five) minutes as needed for chest pain. [name]  torsemide (DEMADEX) 20 MG tablet, Take 1-2 tablets (20-40 mg total) by mouth 2 (two) times daily. Take 2 tabs daily at 8 AM and 1 tab daily after lunch at 1 PM. (Patient taking differently: Take 80 mg by mouth daily. )  Current Facility-Administered Medications (Respiratory):  .  albuterol (VENTOLIN HFA) 108 (90 Base) MCG/ACT inhaler 2 puff  Current Outpatient Medications (Respiratory):  .  albuterol (PROVENTIL HFA;VENTOLIN HFA) 108 (90 BASE) MCG/ACT inhaler, Inhale 2 puffs into the lungs every 6 (six) hours as needed for wheezing or shortness of breath.  Current Facility-Administered Medications (Analgesics):  .  acetaminophen (TYLENOL) tablet 650 mg **OR** acetaminophen (TYLENOL) suppository 650 mg  .  allopurinol (ZYLOPRIM) tablet 100 mg .  aspirin EC tablet 81 mg  Current Outpatient Medications (Analgesics):  [name]  Acetaminophen (TYLENOL ARTHRITIS PAIN PO), Take 2 tablets by mouth daily as needed (pain). [name]  allopurinol (ZYLOPRIM) 100 MG tablet, Take 100 mg by mouth 2 (two) times daily.  [name]  aspirin EC 81 MG tablet, Take 81 mg by mouth daily.  Current Facility-Administered Medications (Hematological):  .  enoxaparin (LOVENOX) injection 30 mg   Current Facility-Administered  Medications (Other):  .  latanoprost (XALATAN) 0.005 % ophthalmic solution 1 drop .  pantoprazole (PROTONIX) EC tablet 40 mg .  sodium chloride flush (NS) 0.9 % injection 3 mL  Current Outpatient Medications (Other):  .  esomeprazole (NEXIUM) 40 MG capsule, Take 40 mg by mouth daily at 12 noon. .  latanoprost (XALATAN) 0.005 % ophthalmic solution, Place 1 drop into both eyes at bedtime. .  Vitamin D, Cholecalciferol, 400 UNITS CAPS, Take 2 capsules by mouth daily.  Anti-infectives (From admission, onward)   None      Scheduled Meds: . allopurinol  100 mg Oral BID  . aspirin EC  81 mg Oral Daily  . carvedilol  6.25 mg Oral BID WC  . heparin injection (subcutaneous)  5,000 Units Subcutaneous Q8H  . latanoprost  1 drop Both Eyes QHS  . pantoprazole  40 mg Oral Daily  . potassium chloride  40 mEq Oral BID  . sodium chloride flush  3 mL Intravenous Q12H    LOS: 1 day    Para Skeans, MD [facility] Pager [phone] If 7PM-7AM, please contact night-coverage [email or web address] Password [facility] 02/14/2020, 3:23 PM

## 2020-02-14 NOTE — ED Notes (Signed)
Pt c/o of increased pain in right leg and left heel. PRN tylenol given and lotion massaged onto legs per pt request. Both legs elevated.   Pt does not appear in distress, respirations are even and non-labored  Skin is warm, dry and intact.  Provided cup of water and juice

## 2020-02-14 NOTE — ED Notes (Signed)
Inpatient Provider at bedside. 

## 2020-02-14 NOTE — Consult Note (Addendum)
Cardiology Consultation:   Patient ID: Doris Howell MRN: [id number]; DOB: 11-27-1935  Admit date: 02/13/2020 Date of Consult: 02/14/2020  Primary Care Provider: Lavone Orn, MD [facility] Cardiologist: Mertie Moores, MD   Patient Profile:   Doris Howell is a 84 y.o. female with a hx of chronic combined CHF, pulmonary hypertension with biventricular failure, chronic kidney disease stage 5, s/p left nephrectomy,  Left bundle branch block chronically, obesity sleep apnea on CPAP, hypertension, hyperlipidemia and GERD who is being seen today for the evaluation of CHF at the request of Dr. Posey Pronto.   Echo 04/2018 showed LVEF of 35-40%, Gr 2 DD, mod elevated RVSP (43.5), mod BAE, trivial Eff, mod MR, mild TR.  Cath avoided due to CKD. Myoview 04/2018 without ischemia.   Diuretics manages by Nephrologist per last office visit note 12/30/20 by Richardson Dopp, PA-C.   History of Present Illness:   Ms. Doris Howell presented yesterday with bilateral lower extremity edema, shortness of breath and right ankle pain.  Patient reported chronic shortness of breath since diagnosed with CHF last year.  Recently worsening lower extremity edema.  Had injury to her right leg and since then noted right ankle pain.  Evaluated by podiatrist 11/3 and underwent right great toe nail debridement.  Patient reported worsening lower extremity edema with heaviness and came to ER.  BNP elevated at 3300.  Creatinine 4.49.  Chest x-ray with pulmonary vascular congestion.  CT of abdominal pelvis showed mild ascites and small bilateral pleural effusion with mild changes of anasarca.  Patient was admitted and given IV Lasix 60 mg x 2.  Intake and output has not recorded.  Breathing and edema has been improving.   Past Medical History:  Diagnosis Date   CAP (community acquired pneumonia) 01/09/2014   "first time I've ever had it"   Diastolic congestive heart failure (HCC)    GERD (gastroesophageal reflux  disease)    High cholesterol    History of gout    Hypertension    Kidney stones    Type II diabetes mellitus with stage 4 chronic kidney disease ([place])     Past Surgical History:  Procedure Laterality Date   ABDOMINAL HYSTERECTOMY  ~ 1974   CATARACT EXTRACTION W/ INTRAOCULAR LENS  IMPLANT, BILATERAL Bilateral 2014   COLONOSCOPY WITH PROPOFOL N/A 10/23/2012   Procedure: COLONOSCOPY WITH PROPOFOL;  Surgeon: [name], MD;  Location: [facility] ENDOS[facility];  Service: Endoscopy;  Laterality: N/A;   KIDNEY STONE SURGERY  10-05-12   "cut me on the side"   LITHOTRIPSY  1980's?   RIGHT HEART CATHETERIZATION N/A 04/15/2014   Procedure: RIGHT HEART CATH;  Surgeon: Sanda Klein, MD;  Location: [facility] CATH LAB;  Service: Cardiovascular;  Laterality: N/A;    Inpatient Medications: Scheduled Meds:  allopurinol  100 mg Oral BID   aspirin EC  81 mg Oral Daily   carvedilol  6.25 mg Oral BID WC   enoxaparin (LOVENOX) injection  30 mg Subcutaneous Q24H   latanoprost  1 drop Both Eyes QHS   pantoprazole  40 mg Oral Daily   potassium chloride  40 mEq Oral BID   sodium chloride flush  3 mL Intravenous Q12H   Continuous Infusions:   PRN Meds: acetaminophen **OR** acetaminophen, albuterol  Allergies:    Allergies  Allergen Reactions   Other Nausea Only    pholcodeine   Simvastatin Other (See Comments)    Myalgias- MD took patient off med   Codeine Other (See Comments)  Unknown; pt can't remember. It was in the '70s     Social History:   Social History   Socioeconomic History   Marital status: Married    Spouse name: Not on file   Number of children: Not on file   Years of education: Not on file   Highest education level: Not on file  Occupational History   Not on file  Tobacco Use   Smoking status: Never Smoker   Smokeless tobacco: Never Used  Vaping Use   Vaping Use: Never used  Substance and Sexual Activity   Alcohol use: No   Drug use: No   Sexual activity: Not Currently    Other Topics Concern   Not on file  Social History Narrative   Not on file   Social Determinants of Health   Financial Resource Strain:    Difficulty of Paying Living Expenses: Not on file  Food Insecurity:    Worried About [place] in the Last Year: Not on file   Ran Out of Food in the Last Year: Not on file  Transportation Needs:    Lack of Transportation (Medical): Not on file   Lack of Transportation (Non-Medical): Not on file  Physical Activity:    Days of Exercise per Week: Not on file   Minutes of Exercise per Session: Not on file  Stress:    Feeling of Stress : Not on file  Social Connections:    Frequency of Communication with Friends and Family: Not on file   Frequency of Social Gatherings with Friends and Family: Not on file   Attends Religious Services: Not on file   Active Member of Clubs or Organizations: Not on file   Attends Archivist Meetings: Not on file   Marital Status: Not on file  Intimate Partner Violence:    Fear of Current or Ex-Partner: Not on file   Emotionally Abused: Not on file   Physically Abused: Not on file   Sexually Abused: Not on file    Family History:   Family History  Problem Relation Age of Onset   Hypertension Mother    Diabetes Brother    CAD Neg Hx      ROS:  Please see the history of present illness.  All other ROS reviewed and negative.     Physical Exam/Data:   Vitals:   02/14/20 1000 02/14/20 1100 02/14/20 1200 02/14/20 1300  BP: (!) 129/51 133/65 (!) 133/52 (!) 126/94  Pulse: (!) 59 (!) 55 (!) 51 (!) 58  Resp: (!) 26 20 19 15   Temp:      TempSrc:      SpO2: 100% 97% 98% 100%  Weight:      Height:       No intake or output data in the 24 hours ending 02/14/20 1406 Last 3 Weights 02/13/2020 12/31/2019 07/03/2019  Weight (lbs) 143 lb 148 lb 148 lb 8 oz  Weight (kg) 64.864 kg 67.132 kg 67.359 kg     Body mass index is 27.02 kg/m.  General:  Well nourished, well developed, in no acute  distress HEENT: normal Lymph: no adenopathy Neck: + JVD Endocrine:  No thryomegaly Vascular: No carotid bruits; FA pulses 2+ bilaterally without bruits  Cardiac:  normal S1, S2; irregular; no murmur  Lungs:  clear to auscultation bilaterally, no wheezing, rhonchi or rales  Abd: soft, nontender, no hepatomegaly  Ext: Trace edema with venous stasis Musculoskeletal:  No deformities, BUE and BLE strength normal and  equal Skin: warm and dry  Neuro:  CNs 2-12 intact, no focal abnormalities noted Psych:  Normal affect   EKG:  The EKG was personally reviewed and demonstrates: SR, PAC, PVCs Telemetry:  Telemetry was personally reviewed and demonstrates: Sinus rhythm with frequent PVC, ventricular bigeminy and trigeminy  Relevant CV Studies:  Echo 04/2018 1. The left ventricle appears to be mildly increased in size, have mild  wall thickness, with moderately reduced systolic function of 60-63%. Echo  evidence of pseudonormal in diastolic filling patterns.   2. LV global hypokinesis.   3. Right ventricular systolic pressure is is moderately elevated.   4. The right ventricle is mildly enlarged in size, has normal wall  thickness and normal systolic function.   5. Moderately dilated left atrial size.   6. Moderately dilated right atrial size.   7. Trivial pericardial effusion, as described above.   8. Mitral valve regurgitation is moderate by color flow Doppler.   9. The mitral valve normal in structure and function.  10. Normal tricuspid valve.  11. Tricuspid regurgitation is mild.  12. Aortic valve normal.  13. The inferior vena cava was dilated in size with <50% respiratory  variablity.  14. No atrial level shunt detected by color flow Doppler.    Stress test 05/09/2018 1. The left ventricle appears to be mildly increased in size, have mild  wall thickness, with moderately reduced systolic function of 01-60%. Echo  evidence of pseudonormal in diastolic filling patterns.   2. LV  global hypokinesis.   3. Right ventricular systolic pressure is is moderately elevated.   4. The right ventricle is mildly enlarged in size, has normal wall  thickness and normal systolic function.   5. Moderately dilated left atrial size.   6. Moderately dilated right atrial size.   7. Trivial pericardial effusion, as described above.   8. Mitral valve regurgitation is moderate by color flow Doppler.   9. The mitral valve normal in structure and function.  10. Normal tricuspid valve.  11. Tricuspid regurgitation is mild.  12. Aortic valve normal.  13. The inferior vena cava was dilated in size with <50% respiratory  variablity.  14. No atrial level shunt detected by color flow Doppler.  Laboratory Data:  Chemistry Recent Labs  Lab 02/13/20 1414 02/14/20 0213  NA 144 145  K 3.5 3.4*  CL 105 109  CO2 21* 22  GLUCOSE 109* 207*  BUN 103* 104*  CREATININE 4.98* 4.99*  CALCIUM 8.5* 8.5*  GFRNONAA 8* 8*  ANIONGAP 18* 14   Hematology Recent Labs  Lab 02/13/20 1414 02/14/20 0213  WBC 3.4* 3.6*  RBC 3.55* 3.31*  HGB 11.0* 10.7*  HCT 36.9 34.7*  MCV 103.9* 104.8*  MCH 31.0 32.3  MCHC 29.8* 30.8  RDW 16.5* 16.7*  PLT 121* 126*   BNP Recent Labs  Lab 02/13/20 1414  BNP 3,370.2*     Radiology/Studies:  CT ABDOMEN PELVIS WO CONTRAST  Result Date: 02/13/2020 CLINICAL DATA:  Recent fall with flank bruising, initial encounter EXAM: CT ABDOMEN AND PELVIS WITHOUT CONTRAST TECHNIQUE: Multidetector CT imaging of the abdomen and pelvis was performed following the standard protocol without IV contrast. COMPARISON:  None. FINDINGS: Lower chest: Small pleural effusions are noted bilaterally right greater than left. Mild bibasilar atelectasis is seen. Cardiomegaly is noted. Hepatobiliary: No focal liver abnormality is seen. No gallstones, gallbladder wall thickening, or biliary dilatation. Pancreas: Unremarkable. No pancreatic ductal dilatation or surrounding inflammatory changes.  Spleen: Normal in size without focal  abnormality. Adrenals/Urinary Tract: Adrenal glands are within normal limits. Right kidney shows no obstructive or inflammatory changes. The left kidney has been surgically removed. Dystrophic calcifications in the surgical bed are noted. Bladder is well distended. Stomach/Bowel: Scattered diverticular change of the colon is noted without evidence of diverticulitis. No obstructive changes are seen. Stomach and small bowel are within normal limits. Vascular/Lymphatic: Aortic atherosclerosis. No enlarged abdominal or pelvic lymph nodes. Reproductive: Status post hysterectomy. No adnexal masses. Other: Minimal ascites is noted. Small ventral hernia containing fluid and fat is noted anterior to the liver. Mild changes of anasarca are noted. Musculoskeletal: Degenerative changes of the thoracolumbar spine are noted. No acute bony abnormality is seen. Increased soft tissue density is noted in the left flank consistent with the given clinical history of bruising. No definitive underlying rib fracture is seen. IMPRESSION: Soft tissue changes consistent with the recent fall and flank bruising. No rib fractures are no visceral injury is seen. Status post left nephrectomy. Diverticulosis without diverticulitis. Small anterior abdominal wall hernia containing fat and fluid. Mild ascites and small bilateral effusions. Mild changes of anasarca are noted as well. Stable cardiomegaly. Electronically Signed   By: Inez Catalina M.D.   On: 02/13/2020 18:38   DG Chest 1 View  Result Date: 02/11/2020 CLINICAL DATA:  84 year old female with shortness of breath. Bilateral leg pain. EXAM: CHEST  1 VIEW COMPARISON:  Chest radiographs 06/18/2018 and earlier. FINDINGS: AP seated view at 1647 hours. Moderate to severe cardiomegaly, stable when allowing for differences in technique from last year. Calcified aortic atherosclerosis. Other mediastinal contours are within normal limits. Visualized tracheal  air column is within normal limits. Mildly lower lung volumes. No pneumothorax or pulmonary edema. No pleural effusion or confluent pulmonary opacity. No acute osseous abnormality identified. IMPRESSION: Stable moderate to severe cardiomegaly. No acute cardiopulmonary abnormality. Electronically Signed   By: Genevie Ann M.D.   On: 02/11/2020 17:04   DG Chest 2 View  Result Date: 02/13/2020 CLINICAL DATA:  Shortness of breath. EXAM: CHEST - 2 VIEW COMPARISON:  02/11/2020. FINDINGS: Cardiomegaly with mild pulmonary venous congestion and bilateral interstitial prominence suggesting mild CHF. Pneumonitis can not be excluded. Low lung volumes with right base atelectasis and or infiltrate. No pleural effusion or pneumothorax. Diffuse osteopenia degenerative change. IMPRESSION: 1. Cardiomegaly with mild pulmonary venous congestion and bilateral interstitial prominence suggesting mild CHF. Pneumonitis can not be excluded. 2.  Low lung volumes with right base atelectasis/infiltrate. Electronically Signed   By: Marcello Moores  Register   On: 02/13/2020 14:43    Assessment and Plan:   Acute on chronic combined CHF -Echocardiogram pending this admission.  Had negative stress test January 2020. -BNP elevated at 3370 -Checks x-ray with vascular congestion -Patient is diuresing well but INO did not recorded  - Pt is still volume overloaded on exam -Continue diuresis, which reviewed dosing with MD, likely based on echo  - Continue BB - No ACE/ARB due to CKD -Consider Imdur/Hydralazine (add one agent first).   2.  Anasarca with CKD stage V/ s/p left nephrectomy -Mild ascites on CT of abdomen -Patient has chronic venous stasis -Reports significant diuresis with Lasix  3.  PVC, ventricular bigeminy/trigeminy -Continue beta-blocker -Keep K above 4 and magnesium above 2  4. R ankle/foot pain - on allopurinol for chronic gout - pending X-ray - Per primary team   MD to see. Likely conservative management with  medications given CKD unless plan for dialysis. Continue IV diuresis.   New York Heart Association (NYHA)  Functional Class NYHA Class II   For questions or updates, please contact [facility] Please consult [email or web address] for contact info under    Jarrett Soho, PA  02/14/2020 2:06 PM  Personally seen and examined. Agree with APP above with the following comments: Briefly 84 yo F with CKD Stage V, HFrEF EF 35-40%, OSA and CPAP for exertional, presenting with exertional DOE. Patient notes that she has always had heart flutters (has frequent, polymorphic PVCs).   Exam notable for holosystolic murmur III/VI Labs notable for Creatinine 4.99 from 4.98 Personally reviewed relevant tests; Echo not completed but notable from at least moderate, if not severe mitral regurgitation; lack of valve prolapse.  Pending full study completion Would recommend continue diuresis cautiously.  May benefit from nephrology consultation. Discussed at length with patient:  She would not want HD given that her family members had a bad experience in the past.  Discussed how this would affect cardiac interventions.  Will not pursue LHC even if new WMAs given patients wishes.  Werner Lean, MD

## 2020-02-15 DIAGNOSIS — I493 Ventricular premature depolarization: Secondary | ICD-10-CM

## 2020-02-15 DIAGNOSIS — N184 Chronic kidney disease, stage 4 (severe): Secondary | ICD-10-CM | POA: Diagnosis not present

## 2020-02-15 DIAGNOSIS — I5020 Unspecified systolic (congestive) heart failure: Secondary | ICD-10-CM | POA: Diagnosis not present

## 2020-02-15 LAB — BRAIN NATRIURETIC PEPTIDE: B Natriuretic Peptide: 4429.6 pg/mL — ABNORMAL HIGH (ref 0.0–100.0)

## 2020-02-15 MED ORDER — HYDRALAZINE HCL 25 MG PO TABS
25.0000 mg | ORAL_TABLET | Freq: Three times a day (TID) | ORAL | Status: DC
  Administered 2020-02-15 – 2020-02-16 (×3): 25 mg via ORAL
  Filled 2020-02-15 (×3): qty 1

## 2020-02-15 NOTE — Progress Notes (Signed)
Progress Note  Patient Name: Doris Howell Date of Encounter: 02/15/2020  Primary Cardiologist: Mertie Moores, MD   Subjective   84 y.o. female with a history HFrEF baseline 35%, chronic kidney disease stage 5, s/p left nephrectomy,  Left bundle branch block chronically, obesity and OSA apnea on CPAP, hypertension, hyperlipidemia with frequent PVCs, seen 02/13/20 for SOB.  Found to have moderate tricuspid regurgitation and  Moderate mitral regurgitation.  With worsening EF.  Offered LHC 02/14/20 for worsening EF and increased PVCs, but given risk of needing dialysis post contrast load, patient deferred, as she said she would never want HD.  Today, patient notes improved shortness of breath and no heart murmur.  Inpatient Medications    Scheduled Meds:  allopurinol  100 mg Oral BID   aspirin EC  81 mg Oral Daily   carvedilol  6.25 mg Oral BID WC   heparin injection (subcutaneous)  5,000 Units Subcutaneous Q8H   latanoprost  1 drop Both Eyes QHS   pantoprazole  40 mg Oral Daily   potassium chloride  40 mEq Oral BID   sodium chloride flush  3 mL Intravenous Q12H   Continuous Infusions:   PRN Meds: acetaminophen **OR** acetaminophen, albuterol   Vital Signs    Vitals:   02/14/20 2127 02/15/20 0016 02/15/20 0652 02/15/20 1103  BP: (!) 141/54 (!) 149/85 (!) 162/77 137/64  Pulse: 66 67 60 (!) 58  Resp: (!) 22 (!) 23 20 20   Temp: 97.9 F (36.6 C) 97.6 F (36.4 C) 97.8 F (36.6 C) 98 F (36.7 C)  TempSrc: Oral Oral Oral Oral  SpO2: 98% 98% 96% 96%  Weight:      Height:        Intake/Output Summary (Last 24 hours) at 02/15/2020 1137 Last data filed at 02/15/2020 8850 Gross per 24 hour  Intake --  Output 1600 ml  Net -1600 ml   Filed Weights   02/13/20 1407  Weight: 64.9 kg    Telemetry    SR LBBB with frequent PVCs - Personally Reviewed  ECG    No new - Personally Reviewed  Physical Exam   GEN: No acute distress.   Neck: No JVD Cardiac: RRR, II/VI  systolic murmur, rubs, or gallops.  Respiratory: Clear to auscultation bilaterally. GI: Soft, nontender, non-distended  MS: No edema; No deformity. Neuro:  Nonfocal  Psych: Normal affect   Labs    Chemistry Recent Labs  Lab 02/13/20 1414 02/14/20 0213  NA 144 145  K 3.5 3.4*  CL 105 109  CO2 21* 22  GLUCOSE 109* 207*  BUN 103* 104*  CREATININE 4.98* 4.99*  CALCIUM 8.5* 8.5*  GFRNONAA 8* 8*  ANIONGAP 18* 14     Hematology Recent Labs  Lab 02/13/20 1414 02/14/20 0213 02/14/20 1708  WBC 3.4* 3.6*  --   RBC 3.55* 3.31* 3.54*  HGB 11.0* 10.7*  --   HCT 36.9 34.7*  --   MCV 103.9* 104.8*  --   MCH 31.0 32.3  --   MCHC 29.8* 30.8  --   RDW 16.5* 16.7*  --   PLT 121* 126*  --     Cardiac EnzymesNo results for input(s): TROPONINI in the last 168 hours. No results for input(s): TROPIPOC in the last 168 hours.   BNP Recent Labs  Lab 02/13/20 1414  BNP 3,370.2*     DDimer No results for input(s): DDIMER in the last 168 hours.   Radiology    CT ABDOMEN PELVIS  WO CONTRAST  Result Date: 02/13/2020 CLINICAL DATA:  Recent fall with flank bruising, initial encounter EXAM: CT ABDOMEN AND PELVIS WITHOUT CONTRAST TECHNIQUE: Multidetector CT imaging of the abdomen and pelvis was performed following the standard protocol without IV contrast. COMPARISON:  None. FINDINGS: Lower chest: Small pleural effusions are noted bilaterally right greater than left. Mild bibasilar atelectasis is seen. Cardiomegaly is noted. Hepatobiliary: No focal liver abnormality is seen. No gallstones, gallbladder wall thickening, or biliary dilatation. Pancreas: Unremarkable. No pancreatic ductal dilatation or surrounding inflammatory changes. Spleen: Normal in size without focal abnormality. Adrenals/Urinary Tract: Adrenal glands are within normal limits. Right kidney shows no obstructive or inflammatory changes. The left kidney has been surgically removed. Dystrophic calcifications in the surgical bed are  noted. Bladder is well distended. Stomach/Bowel: Scattered diverticular change of the colon is noted without evidence of diverticulitis. No obstructive changes are seen. Stomach and small bowel are within normal limits. Vascular/Lymphatic: Aortic atherosclerosis. No enlarged abdominal or pelvic lymph nodes. Reproductive: Status post hysterectomy. No adnexal masses. Other: Minimal ascites is noted. Small ventral hernia containing fluid and fat is noted anterior to the liver. Mild changes of anasarca are noted. Musculoskeletal: Degenerative changes of the thoracolumbar spine are noted. No acute bony abnormality is seen. Increased soft tissue density is noted in the left flank consistent with the given clinical history of bruising. No definitive underlying rib fracture is seen. IMPRESSION: Soft tissue changes consistent with the recent fall and flank bruising. No rib fractures are no visceral injury is seen. Status post left nephrectomy. Diverticulosis without diverticulitis. Small anterior abdominal wall hernia containing fat and fluid. Mild ascites and small bilateral effusions. Mild changes of anasarca are noted as well. Stable cardiomegaly. Electronically Signed   By: Inez Catalina M.D.   On: 02/13/2020 18:38   DG Chest 2 View  Result Date: 02/13/2020 CLINICAL DATA:  Shortness of breath. EXAM: CHEST - 2 VIEW COMPARISON:  02/11/2020. FINDINGS: Cardiomegaly with mild pulmonary venous congestion and bilateral interstitial prominence suggesting mild CHF. Pneumonitis can not be excluded. Low lung volumes with right base atelectasis and or infiltrate. No pleural effusion or pneumothorax. Diffuse osteopenia degenerative change. IMPRESSION: 1. Cardiomegaly with mild pulmonary venous congestion and bilateral interstitial prominence suggesting mild CHF. Pneumonitis can not be excluded. 2.  Low lung volumes with right base atelectasis/infiltrate. Electronically Signed   By: Marcello Moores  Register   On: 02/13/2020 14:43    ECHOCARDIOGRAM COMPLETE  Result Date: 02/14/2020    ECHOCARDIOGRAM REPORT   Patient Name:   Doris Howell Date of Exam: 02/14/2020 Medical Rec #:  [id number]            Height:       61.0 in Accession #:    [id number]           Weight:       143.0 lb Date of Birth:  March 08, 1936             BSA:          1.638 m Patient Age:    58 years             BP:           130/53 mmHg Patient Gender: F                    HR:           45 bpm. Exam Location:  Inpatient Procedure: 2D Echo, Cardiac Doppler and Color Doppler Indications:  Dyspnea 786.09 / R06.00  History:        Patient has prior history of Echocardiogram examinations, most                 recent 05/09/2018. CHF; Risk Factors:Hypertension, Diabetes and                 Dyslipidemia.  Sonographer:    Jonelle Sidle Dance Referring Phys: [id number] [place]  1. LVEF is severely depressed The lateral wall thickens the best; other walls are severly hypokinetic. Left ventricular ejection fraction, by estimation, is 25 to 30%. The left ventricular internal cavity size was mildly dilated. Left ventricular diastolic parameters are indeterminate.  2. Right ventricular systolic function is mildly reduced. The right ventricular size is mildly enlarged. There is moderately elevated pulmonary artery systolic pressure.  3. Left atrial size was severely dilated.  4. Right atrial size was severely dilated.  5. Moderate mitral valve regurgitation.  6. Tricuspid valve regurgitation is mild to moderate.  7. The aortic valve is normal in structure. Aortic valve regurgitation is not visualized.  8. The inferior vena cava is dilated in size with >50% respiratory variability, suggesting right atrial pressure of 8 mmHg. FINDINGS  Left Ventricle: LVEF is severely depressed The lateral wall thickens the best; other walls are severly hypokinetic. Left ventricular ejection fraction, by estimation, is 25 to 30%. The left ventricle has severely decreased function. The left  ventricle demonstrates regional wall motion abnormalities. The left ventricular internal cavity size was mildly dilated. There is no left ventricular hypertrophy. Left ventricular diastolic parameters are indeterminate. Right Ventricle: The right ventricular size is mildly enlarged. Right vetricular wall thickness was not assessed. Right ventricular systolic function is mildly reduced. There is moderately elevated pulmonary artery systolic pressure. The tricuspid regurgitant velocity is 3.09 m/s, and with an assumed right atrial pressure of 15 mmHg, the estimated right ventricular systolic pressure is 24.0 mmHg. Left Atrium: Left atrial size was severely dilated. Right Atrium: Right atrial size was severely dilated. Pericardium: Trivial pericardial effusion is present. Mitral Valve: The mitral valve is normal in structure. Moderate mitral valve regurgitation. Tricuspid Valve: The tricuspid valve is normal in structure. Tricuspid valve regurgitation is mild to moderate. Aortic Valve: The aortic valve is normal in structure. Aortic valve regurgitation is not visualized. Pulmonic Valve: The pulmonic valve was normal in structure. Pulmonic valve regurgitation is mild. Aorta: The aortic root is normal in size and structure. Venous: The inferior vena cava is dilated in size with greater than 50% respiratory variability, suggesting right atrial pressure of 8 mmHg. IAS/Shunts: No atrial level shunt detected by color flow Doppler.  LEFT VENTRICLE PLAX 2D LVIDd:         5.55 cm LVIDs:         4.59 cm LV PW:         1.04 cm LV IVS:        0.97 cm LVOT diam:     1.90 cm LV SV:         40 LV SV Index:   24 LVOT Area:     2.84 cm  RIGHT VENTRICLE          IVC RV Basal diam:  4.14 cm  IVC diam: 2.05 cm RV Mid diam:    2.93 cm TAPSE (M-mode): 1.9 cm LEFT ATRIUM              Index       RIGHT ATRIUM  Index LA diam:        4.40 cm  2.69 cm/m  RA Area:     37.90 cm LA Vol (A2C):   105.0 ml 64.11 ml/m RA Volume:   151.00  ml 92.20 ml/m LA Vol (A4C):   82.6 ml  50.43 ml/m LA Biplane Vol: 93.6 ml  57.15 ml/m  AORTIC VALVE LVOT Vmax:   70.20 cm/s LVOT Vmean:  43.650 cm/s LVOT VTI:    0.141 m  AORTA Ao Root diam: 3.40 cm Ao Asc diam:  3.10 cm MITRAL VALVE                TRICUSPID VALVE MV Area (PHT): 3.20 cm     TR Peak grad:   38.2 mmHg MV Decel Time: 237 msec     TR Vmax:        309.00 cm/s MV E velocity: 115.00 cm/s MV A velocity: 48.00 cm/s   SHUNTS MV E/A ratio:  2.40         Systemic VTI:  0.14 m                             Systemic Diam: 1.90 cm Dorris Carnes MD Electronically signed by Dorris Carnes MD Signature Date/Time: 02/14/2020/6:43:14 PM    Final     Cardiac Studies   LVEF reduced with TR and MR as above.    Patient Profile     84 y.o. female CKD Stage V with worsening HFrEF and frequenct PVCs who wishes medical management.  Assessment & Plan    HFrEF CKD V HTN PVCs - ischemic remains on DDx, patient unamenable to LHC - Would return torsemide 20 mg - no ARNI b/c of CKD - continue BB, no room for increased dose (bradycardia) - will start hydralazine 25 mg TID - if GOC change; would consider LHC but would likely need HD after  Discussed with patient, granddaughter, and primary MD  For questions or updates, please contact [place] Please consult [email or web address] for contact info under Cardiology/STEMI.      Signed, Werner Lean, MD  02/15/2020, 11:37 AM

## 2020-02-15 NOTE — Progress Notes (Addendum)
PROGRESS NOTE    Doris Howell  PYP:[id number] DOB: Feb 09, 1936 DOA: 02/13/2020  PCP: Lavone Orn, MD    Brief Narrative:  Pt is a full code 84 y./o women admitted on 02/13/2020 for SOB and LE edema. Pt states she does not and her heart  md dr told her heart heart is. Good/ I d/w that it is critical that she stay off sodium. She has a  past medical history of CHF/CKD/Pulmonarty htn and was admitted for her chf.  Assessment & Plan: Acute on chronic  exacerbation of systolic CHF (congestive heart failure) ([place]): Pt has echo that showed about 40 # EF and we will repeat echo dn also cardiology has been consulted.Stric I/t.Monitor electrollytes and replace.Cont home regimen of meds as follows: asa/statin/ ace or arb once euvolemic.  Intake/Output Summary (Last 24 hours) at 02/15/2020 1813 Last data filed at 02/15/2020 1409 Gross per 24 hour  Intake 240 ml  Output 700 ml  Net -460 ml   Have changed lasix to 60 mg q 12h for today and we will reassess and redose in am.  I suspect pt will need at lead  another 24-48 hours of hospital monitoring for her A/C on C/H systolic CHF with reduced EF and echo is pending.  SpO2: 97 % on RA and heart rate has been bradycardic, and cardiology has been consulted.  02/15/20 Pt sen with cardiology at bedside pt is declining cath because of renal complication risk.   Bradycardia: Monitoring on telemetry . TFT's. Electrolyte checks.  02/15/20 Cont coreg at same dose pt is asymptomatic.    CKD (chronic kidney disease), stage IV (HCC) Lab Results  Component Value Date   CREATININE 4.99 (H) 02/14/2020   CREATININE 4.98 (H) 02/13/2020   CREATININE 4.51 (H) 01/16/2020  Avoid nephrotoxic agents and renall dose meds. Change Lovenox to heparin .  02/15/20 Labs in am.  Essential hypertension Cont coreg. Cardiac low sodium diet.  Blood pressure 140/73, pulse 62, temperature 98 F (36.7 C), temperature source Oral, resp. rate 20, height 5\' 1"   (1.549 m), weight 64.9 kg, SpO2 97 %. 02/15/20 BP is stable, pt is asymptomatic.   Gout cont allopurinol for hyperuricemia from ckd and gout.   Anemia: Anemia present since 2008 in our charts: Suspect ACD will collect anemia panel . Type/cross/transfues if hb is 8 or below.     DVT prophylaxis: Heparin    Code Status: Full Code  Family Communication: None at bedside. Disposition Plan: TBD   Status is: Inpatient  Remains inpatient appropriate because:Inpatient level of care appropriate due to severity of illness   Dispo: The patient is from: Home              Anticipated d/c is to: Home              Anticipated d/c date is: 2 days              Patient currently is not medically stable to d/c. Consultants:  Cardiology consult.  Procedures:  2 d echocardigram.  Subjective: Pt admitted and is day 1 today she is stable and converses. Pt Denis any complaint otherwise.  Pt was stable able to finish her sentences, she states she is complaint with diet.   02/15/20 Pt is stable today swelling is resolved. On RA.No complaints of chest pain pressure or sob or otherwise.  Echo shows severely depressed LV function.   Objective: Vitals:   02/15/20 1103 02/15/20 1408 02/15/20 1600 02/15/20 1608  BP:  137/64 135/62  140/73  Pulse: (!) 58 69  62  Resp: 20 18 20 20   Temp: 98 F (36.7 C) 97.8 F (36.6 C)  98 F (36.7 C)  TempSrc: Oral Oral  Oral  SpO2: 96% 98%  97%  Weight:      Height:       SpO2: 97 %  Intake/Output Summary (Last 24 hours) at 02/15/2020 1813 Last data filed at 02/15/2020 1409 Gross per 24 hour  Intake 240 ml  Output 700 ml  Net -460 ml   Filed Weights   02/13/20 1407  Weight: 64.9 kg    Examination: Blood pressure 140/73, pulse 62, temperature 98 F (36.7 C), temperature source Oral, resp. rate 20, height 5\' 1"  (1.549 m), weight 64.9 kg, SpO2 97 %. General exam: Appears calm and comfortable  HEENT:EOMI, perrl  Respiratory system:  CLTABL Cardiovascular system: regular with occ irregular breat.  ,JVD difficult to assess ,  No pedal edema . Gastrointestinal system: Abdomen is obese, nondistended, soft and nontender. No organomegaly or masses felt. Normal bowel sounds heard. Central nervous system: Alert and oriented.CN grossly intact No focal neurological deficits. Extremities: pt moving all 4 ext and ambulating. Skin: No rashes, lesions or ulcers Psychiatry: Judgement and insight appear normal. Mood & affect appropriate.   Data Reviewed: I have personally reviewed following labs and imaging studies  I/O last 3 completed shifts: In: -  Out: 1600 [Urine:1600] Total I/O In: 240 [P.O.:240] Out: -  Lab Results  Component Value Date   CREATININE 4.99 (H) 02/14/2020   CREATININE 4.98 (H) 02/13/2020   CREATININE 4.51 (H) 01/16/2020   CBC: Recent Labs  Lab 02/13/20 1414 02/14/20 0213  WBC 3.4* 3.6*  HGB 11.0* 10.7*  HCT 36.9 34.7*  MCV 103.9* 104.8*  PLT 121* 712*   Basic Metabolic Panel: Recent Labs  Lab 02/13/20 1414 02/13/20 1920 02/14/20 0213 02/14/20 1708  NA 144  --  145  --   K 3.5  --  3.4*  --   CL 105  --  109  --   CO2 21*  --  22  --   GLUCOSE 109*  --  207*  --   BUN 103*  --  104*  --   CREATININE 4.98*  --  4.99*  --   CALCIUM 8.5*  --  8.5*  --   MG  --  2.0  --  2.1   GFR: Estimated Creatinine Clearance: 7.5 mL/min (A) (by C-G formula based on SCr of 4.99 mg/dL (H)). Liver Function Tests: No results for input(s): AST, ALT, ALKPHOS, BILITOT, PROT, ALBUMIN in the last 168 hours. No results for input(s): LIPASE, AMYLASE in the last 168 hours. No results for input(s): AMMONIA in the last 168 hours.  Coagulation Profile: No results for input(s): INR, PROTIME in the last 168 hours. Cardiac Enzymes: No results for input(s): CKTOTAL, CKMB, CKMBINDEX, TROPONINI in the last 168 hours. BNP (last 3 results) No results for input(s): PROBNP in the last 8760 hours. HbA1C: No results for  input(s): HGBA1C in the last 72 hours. CBG: Recent Labs  Lab 02/13/20 1412  GLUCAP 102*   Lipid Profile: No results for input(s): CHOL, HDL, LDLCALC, TRIG, CHOLHDL, LDLDIRECT in the last 72 hours. Thyroid Function Tests: Recent Labs    02/14/20 1708  TSH 2.609  FREET4 0.84   Anemia Panel: Recent Labs    02/14/20 1708  VITAMINB12 531  FOLATE 21.7  FERRITIN 193  TIBC 283  IRON 68  RETICCTPCT 1.8   Sepsis Labs: No results for input(s): PROCALCITON, LATICACIDVEN in the last 168 hours.  Recent Results (from the past 240 hour(s))  Resp Panel by RT PCR (RSV, Flu A&B, Covid) - Nasopharyngeal Swab     Status: None   Collection Time: 02/13/20  3:45 PM   Specimen: Nasopharyngeal Swab  Result Value Ref Range Status   SARS Coronavirus 2 by RT PCR NEGATIVE NEGATIVE Final    Comment: (NOTE) SARS-CoV-2 target nucleic acids are NOT DETECTED.  The SARS-CoV-2 RNA is generally detectable in upper respiratoy specimens during the acute phase of infection. The lowest concentration of SARS-CoV-2 viral copies this assay can detect is 131 copies/mL. A negative result does not preclude SARS-Cov-2 infection and should not be used as the sole basis for treatment or other patient management decisions. A negative result may occur with  improper specimen collection/handling, submission of specimen other than nasopharyngeal swab, presence of viral mutation(s) within the areas targeted by this assay, and inadequate number of viral copies (<131 copies/mL). A negative result must be combined with clinical observations, patient history, and epidemiological information. The expected result is Negative.  Fact Sheet for Patients:  PinkCheek.be  Fact Sheet for Healthcare Providers:  GravelBags.it  This test is no t yet approved or cleared by the [place] FDA and  has been authorized for detection and/or diagnosis of SARS-CoV-2 by FDA  under an Emergency Use Authorization (EUA). This EUA will remain  in effect (meaning this test can be used) for the duration of the COVID-19 declaration under Section 564(b)(1) of the Act, 21 U.S.C. section 360bbb-3(b)(1), unless the authorization is terminated or revoked sooner.     Influenza A by PCR NEGATIVE NEGATIVE Final   Influenza B by PCR NEGATIVE NEGATIVE Final    Comment: (NOTE) The Xpert Xpress SARS-CoV-2/FLU/RSV assay is intended as an aid in  the diagnosis of influenza from Nasopharyngeal swab specimens and  should not be used as a sole basis for treatment. Nasal washings and  aspirates are unacceptable for Xpert Xpress SARS-CoV-2/FLU/RSV  testing.  Fact Sheet for Patients: PinkCheek.be  Fact Sheet for Healthcare Providers: GravelBags.it  This test is not yet approved or cleared by the [place] FDA and  has been authorized for detection and/or diagnosis of SARS-CoV-2 by  FDA under an Emergency Use Authorization (EUA). This EUA will remain  in effect (meaning this test can be used) for the duration of the  Covid-19 declaration under Section 564(b)(1) of the Act, 21  U.S.C. section 360bbb-3(b)(1), unless the authorization is  terminated or revoked.    Respiratory Syncytial Virus by PCR NEGATIVE NEGATIVE Final    Comment: (NOTE) Fact Sheet for Patients: PinkCheek.be  Fact Sheet for Healthcare Providers: GravelBags.it  This test is not yet approved or cleared by the [place] FDA and  has been authorized for detection and/or diagnosis of SARS-CoV-2 by  FDA under an Emergency Use Authorization (EUA). This EUA will remain  in effect (meaning this test can be used) for the duration of the  COVID-19 declaration under Section 564(b)(1) of the Act, 21 U.S.C.  section 360bbb-3(b)(1), unless the authorization is terminated or  revoked. Performed at  [facility], [place] [address] [place], [place] [address]       Radiology Studies: ECHOCARDIOGRAM COMPLETE  Result Date: 02/14/2020    ECHOCARDIOGRAM REPORT   Patient Name:   Doris Howell Date of Exam: 02/14/2020 Medical Rec #:  [id number]  Height:       61.0 in Accession #:    [id number]           Weight:       143.0 lb Date of Birth:  [birth year]             BSA:          1.638 m Patient Age:    51 years             BP:           130/53 mmHg Patient Gender: F                    HR:           45 bpm. Exam Location:  Inpatient Procedure: 2D Echo, Cardiac Doppler and Color Doppler Indications:    Dyspnea 786.09 / R06.00  History:        Patient has prior history of Echocardiogram examinations, most                 recent 05/09/2018. CHF; Risk Factors:Hypertension, Diabetes and                 Dyslipidemia.  Sonographer:    Jonelle Sidle Dance Referring Phys: [id number] [place]  1. LVEF is severely depressed The lateral wall thickens the best; other walls are severly hypokinetic. Left ventricular ejection fraction, by estimation, is 25 to 30%. The left ventricular internal cavity size was mildly dilated. Left ventricular diastolic parameters are indeterminate.  2. Right ventricular systolic function is mildly reduced. The right ventricular size is mildly enlarged. There is moderately elevated pulmonary artery systolic pressure.  3. Left atrial size was severely dilated.  4. Right atrial size was severely dilated.  5. Moderate mitral valve regurgitation.  6. Tricuspid valve regurgitation is mild to moderate.  7. The aortic valve is normal in structure. Aortic valve regurgitation is not visualized.  8. The inferior vena cava is dilated in size with >50% respiratory variability, suggesting right atrial pressure of 8 mmHg. FINDINGS  Left Ventricle: LVEF is severely depressed The lateral wall thickens the best; other walls are severly hypokinetic. Left ventricular ejection fraction,  by estimation, is 25 to 30%. The left ventricle has severely decreased function. The left ventricle demonstrates regional wall motion abnormalities. The left ventricular internal cavity size was mildly dilated. There is no left ventricular hypertrophy. Left ventricular diastolic parameters are indeterminate. Right Ventricle: The right ventricular size is mildly enlarged. Right vetricular wall thickness was not assessed. Right ventricular systolic function is mildly reduced. There is moderately elevated pulmonary artery systolic pressure. The tricuspid regurgitant velocity is 3.09 m/s, and with an assumed right atrial pressure of 15 mmHg, the estimated right ventricular systolic pressure is 29.9 mmHg. Left Atrium: Left atrial size was severely dilated. Right Atrium: Right atrial size was severely dilated. Pericardium: Trivial pericardial effusion is present. Mitral Valve: The mitral valve is normal in structure. Moderate mitral valve regurgitation. Tricuspid Valve: The tricuspid valve is normal in structure. Tricuspid valve regurgitation is mild to moderate. Aortic Valve: The aortic valve is normal in structure. Aortic valve regurgitation is not visualized. Pulmonic Valve: The pulmonic valve was normal in structure. Pulmonic valve regurgitation is mild. Aorta: The aortic root is normal in size and structure. Venous: The inferior vena cava is dilated in size with greater than 50% respiratory variability, suggesting right atrial pressure of 8 mmHg. IAS/Shunts: No atrial level shunt detected by color flow Doppler.  LEFT VENTRICLE PLAX 2D LVIDd:         5.55 cm LVIDs:         4.59 cm LV PW:         1.04 cm LV IVS:        0.97 cm LVOT diam:     1.90 cm LV SV:         40 LV SV Index:   24 LVOT Area:     2.84 cm  RIGHT VENTRICLE          IVC RV Basal diam:  4.14 cm  IVC diam: 2.05 cm RV Mid diam:    2.93 cm TAPSE (M-mode): 1.9 cm LEFT ATRIUM              Index       RIGHT ATRIUM           Index LA diam:        4.40 cm   2.69 cm/m  RA Area:     37.90 cm LA Vol (A2C):   105.0 ml 64.11 ml/m RA Volume:   151.00 ml 92.20 ml/m LA Vol (A4C):   82.6 ml  50.43 ml/m LA Biplane Vol: 93.6 ml  57.15 ml/m  AORTIC VALVE LVOT Vmax:   70.20 cm/s LVOT Vmean:  43.650 cm/s LVOT VTI:    0.141 m  AORTA Ao Root diam: 3.40 cm Ao Asc diam:  3.10 cm MITRAL VALVE                TRICUSPID VALVE MV Area (PHT): 3.20 cm     TR Peak grad:   38.2 mmHg MV Decel Time: 237 msec     TR Vmax:        309.00 cm/s MV E velocity: 115.00 cm/s MV A velocity: 48.00 cm/s   SHUNTS MV E/A ratio:  2.40         Systemic VTI:  0.14 m                             Systemic Diam: 1.90 cm Dorris Carnes MD Electronically signed by Dorris Carnes MD Signature Date/Time: 02/14/2020/6:43:14 PM    Final    Current Facility-Administered Medications (Cardiovascular):    carvedilol (COREG) tablet 6.25 mg  Current Outpatient Medications (Cardiovascular):    carvedilol (COREG) 6.25 MG tablet, Take 6.25 mg by mouth 2 (two) times daily with a meal.   hydrALAZINE (APRESOLINE) 25 MG tablet, Take 25 mg by mouth 2 (two) times daily.    nitroGLYCERIN (NITROSTAT) 0.4 MG SL tablet, Place 1 tablet (0.4 mg total) under the tongue every 5 (five) minutes as needed for chest pain.   torsemide (DEMADEX) 20 MG tablet, Take 1-2 tablets (20-40 mg total) by mouth 2 (two) times daily. Take 2 tabs daily at 8 AM and 1 tab daily after lunch at 1 PM. (Patient taking differently: Take 80 mg by mouth daily. )  Current Facility-Administered Medications (Respiratory):    albuterol (VENTOLIN HFA) 108 (90 Base) MCG/ACT inhaler 2 puff  Current Outpatient Medications (Respiratory):    albuterol (PROVENTIL HFA;VENTOLIN HFA) 108 (90 BASE) MCG/ACT inhaler, Inhale 2 puffs into the lungs every 6 (six) hours as needed for wheezing or shortness of breath.  Current Facility-Administered Medications (Analgesics):    acetaminophen (TYLENOL) tablet 650 mg **OR** acetaminophen (TYLENOL) suppository 650 mg     allopurinol (ZYLOPRIM) tablet 100 mg   aspirin EC tablet 81 mg  Current Outpatient  Medications (Analgesics):    Acetaminophen (TYLENOL ARTHRITIS PAIN PO), Take 2 tablets by mouth daily as needed (pain).   allopurinol (ZYLOPRIM) 100 MG tablet, Take 100 mg by mouth 2 (two) times daily.    aspirin EC 81 MG tablet, Take 81 mg by mouth daily.  Current Facility-Administered Medications (Hematological):    enoxaparin (LOVENOX) injection 30 mg   Current Facility-Administered Medications (Other):    latanoprost (XALATAN) 0.005 % ophthalmic solution 1 drop   pantoprazole (PROTONIX) EC tablet 40 mg   sodium chloride flush (NS) 0.9 % injection 3 mL  Current Outpatient Medications (Other):    esomeprazole (NEXIUM) 40 MG capsule, Take 40 mg by mouth daily at 12 noon.   latanoprost (XALATAN) 0.005 % ophthalmic solution, Place 1 drop into both eyes at bedtime.   Vitamin D, Cholecalciferol, 400 UNITS CAPS, Take 2 capsules by mouth daily.  Anti-infectives (From admission, onward)   None      Scheduled Meds:  allopurinol  100 mg Oral BID   aspirin EC  81 mg Oral Daily   carvedilol  6.25 mg Oral BID WC   heparin injection (subcutaneous)  5,000 Units Subcutaneous Q8H   hydrALAZINE  25 mg Oral Q8H   latanoprost  1 drop Both Eyes QHS   pantoprazole  40 mg Oral Daily   potassium chloride  40 mEq Oral BID   sodium chloride flush  3 mL Intravenous Q12H    LOS: 2 days    Para Skeans, MD [facility] Pager [phone] If 7PM-7AM, please contact night-coverage [email or web address] Password [facility] 02/15/2020, 6:13 PM

## 2020-02-16 ENCOUNTER — Other Ambulatory Visit: Payer: Self-pay | Admitting: Medical

## 2020-02-16 ENCOUNTER — Inpatient Hospital Stay (HOSPITAL_COMMUNITY): Payer: Medicare Other

## 2020-02-16 DIAGNOSIS — N185 Chronic kidney disease, stage 5: Secondary | ICD-10-CM | POA: Diagnosis not present

## 2020-02-16 DIAGNOSIS — I5020 Unspecified systolic (congestive) heart failure: Secondary | ICD-10-CM | POA: Diagnosis not present

## 2020-02-16 DIAGNOSIS — I10 Essential (primary) hypertension: Secondary | ICD-10-CM

## 2020-02-16 DIAGNOSIS — I5032 Chronic diastolic (congestive) heart failure: Secondary | ICD-10-CM

## 2020-02-16 DIAGNOSIS — I493 Ventricular premature depolarization: Secondary | ICD-10-CM | POA: Diagnosis not present

## 2020-02-16 DIAGNOSIS — I5033 Acute on chronic diastolic (congestive) heart failure: Secondary | ICD-10-CM

## 2020-02-16 LAB — COMPREHENSIVE METABOLIC PANEL
ALT: 12 U/L (ref 0–44)
AST: 16 U/L (ref 15–41)
Albumin: 3.1 g/dL — ABNORMAL LOW (ref 3.5–5.0)
Alkaline Phosphatase: 58 U/L (ref 38–126)
Anion gap: 11 (ref 5–15)
BUN: 99 mg/dL — ABNORMAL HIGH (ref 8–23)
CO2: 23 mmol/L (ref 22–32)
Calcium: 8.7 mg/dL — ABNORMAL LOW (ref 8.9–10.3)
Chloride: 112 mmol/L — ABNORMAL HIGH (ref 98–111)
Creatinine, Ser: 4.69 mg/dL — ABNORMAL HIGH (ref 0.44–1.00)
GFR, Estimated: 9 mL/min — ABNORMAL LOW (ref >60)
Glucose, Bld: 117 mg/dL — ABNORMAL HIGH (ref 70–99)
Potassium: 4.5 mmol/L (ref 3.5–5.1)
Sodium: 146 mmol/L — ABNORMAL HIGH (ref 135–145)
Total Bilirubin: 0.6 mg/dL (ref 0.3–1.2)
Total Protein: 5.5 g/dL — ABNORMAL LOW (ref 6.5–8.1)

## 2020-02-16 LAB — BASIC METABOLIC PANEL
Anion gap: 14 (ref 5–15)
BUN: 98 mg/dL — ABNORMAL HIGH (ref 8–23)
CO2: 20 mmol/L — ABNORMAL LOW (ref 22–32)
Calcium: 8.9 mg/dL (ref 8.9–10.3)
Chloride: 110 mmol/L (ref 98–111)
Creatinine, Ser: 4.56 mg/dL — ABNORMAL HIGH (ref 0.44–1.00)
GFR, Estimated: 9 mL/min — ABNORMAL LOW (ref >60)
Glucose, Bld: 133 mg/dL — ABNORMAL HIGH (ref 70–99)
Potassium: 4.3 mmol/L (ref 3.5–5.1)
Sodium: 144 mmol/L (ref 135–145)

## 2020-02-16 LAB — CBC WITH DIFFERENTIAL/PLATELET
Abs Immature Granulocytes: 0.01 10*3/uL (ref 0.00–0.07)
Basophils Absolute: 0 10*3/uL (ref 0.0–0.1)
Basophils Relative: 1 %
Eosinophils Absolute: 0.2 10*3/uL (ref 0.0–0.5)
Eosinophils Relative: 5 %
HCT: 34.7 % — ABNORMAL LOW (ref 36.0–46.0)
Hemoglobin: 10.6 g/dL — ABNORMAL LOW (ref 12.0–15.0)
Immature Granulocytes: 0 %
Lymphocytes Relative: 12 %
Lymphs Abs: 0.5 10*3/uL — ABNORMAL LOW (ref 0.7–4.0)
MCH: 31.3 pg (ref 26.0–34.0)
MCHC: 30.5 g/dL (ref 30.0–36.0)
MCV: 102.4 fL — ABNORMAL HIGH (ref 80.0–100.0)
Monocytes Absolute: 0.7 10*3/uL (ref 0.1–1.0)
Monocytes Relative: 16 %
Neutro Abs: 2.9 10*3/uL (ref 1.7–7.7)
Neutrophils Relative %: 66 %
Platelets: 105 10*3/uL — ABNORMAL LOW (ref 150–400)
RBC: 3.39 MIL/uL — ABNORMAL LOW (ref 3.87–5.11)
RDW: 16.1 % — ABNORMAL HIGH (ref 11.5–15.5)
WBC: 4.4 10*3/uL (ref 4.0–10.5)
nRBC: 0 % (ref 0.0–0.2)

## 2020-02-16 LAB — BRAIN NATRIURETIC PEPTIDE: B Natriuretic Peptide: >4500 pg/mL — ABNORMAL HIGH (ref 0.0–100.0)

## 2020-02-16 LAB — PHOSPHORUS
Phosphorus: 5.6 mg/dL — ABNORMAL HIGH (ref 2.5–4.6)
Phosphorus: 5.5 mg/dL — ABNORMAL HIGH (ref 2.5–4.6)

## 2020-02-16 LAB — MAGNESIUM
Magnesium: 2 mg/dL (ref 1.7–2.4)
Magnesium: 2 mg/dL (ref 1.7–2.4)

## 2020-02-16 MED ORDER — CARVEDILOL 3.125 MG PO TABS: 3.1250 mg | ORAL_TABLET | Freq: Two times a day (BID) | ORAL | Status: DC

## 2020-02-16 MED ORDER — CARVEDILOL 3.125 MG PO TABS
3.1250 mg | ORAL_TABLET | Freq: Two times a day (BID) | ORAL | Status: DC
  Administered 2020-02-18 – 2020-02-19 (×3): 3.125 mg via ORAL
  Filled 2020-02-16 (×4): qty 1

## 2020-02-16 MED ORDER — LORAZEPAM 0.5 MG PO TABS
0.5000 mg | ORAL_TABLET | Freq: Three times a day (TID) | ORAL | Status: DC | PRN
  Administered 2020-02-16: 0.5 mg via ORAL
  Filled 2020-02-16: qty 1

## 2020-02-16 MED ORDER — HYDRALAZINE HCL 50 MG PO TABS
50.0000 mg | ORAL_TABLET | Freq: Three times a day (TID) | ORAL | Status: DC
  Administered 2020-02-16 – 2020-02-19 (×10): 50 mg via ORAL
  Filled 2020-02-16 (×11): qty 1

## 2020-02-16 MED ORDER — FUROSEMIDE 10 MG/ML IJ SOLN
100.0000 mg | Freq: Two times a day (BID) | INTRAVENOUS | Status: DC
  Administered 2020-02-16 – 2020-02-18 (×4): 100 mg via INTRAVENOUS
  Filled 2020-02-16 (×5): qty 10

## 2020-02-16 NOTE — Progress Notes (Signed)
Progress Note  Patient Name: Doris Howell Date of Encounter: 02/16/2020  Primary Cardiologist: Mertie Moores, MD   Subjective   84 y.o. female with a history HFrEF baseline 35%, chronic kidney disease stage 5, s/p left nephrectomy,  Left bundle branch block chronically, obesity and OSA apnea on CPAP, hypertension, hyperlipidemia with frequent PVCs, seen 02/13/20 for SOB.  Found to have moderate tricuspid regurgitation and  Moderate mitral regurgitation.  With worsening EF.  Offered LHC 02/14/20 for worsening EF and increased PVCs, but given risk of needing dialysis post contrast load, patient deferred, as she said she would never want HD.  Notes that she is having trouble sleeping here.  No chest pain or SOB.  Inpatient Medications    Scheduled Meds:  allopurinol  100 mg Oral BID   aspirin EC  81 mg Oral Daily   carvedilol  6.25 mg Oral BID WC   heparin injection (subcutaneous)  5,000 Units Subcutaneous Q8H   hydrALAZINE  25 mg Oral Q8H   latanoprost  1 drop Both Eyes QHS   pantoprazole  40 mg Oral Daily   sodium chloride flush  3 mL Intravenous Q12H   Continuous Infusions:   PRN Meds: acetaminophen **OR** acetaminophen, albuterol   Vital Signs    Vitals:   02/16/20 0436 02/16/20 0744 02/16/20 0900 02/16/20 0925  BP: (!) 162/72 (!) 136/49  (!) 154/60  Pulse: 66 64  (!) 56  Resp: (!) 28 20 20 20   Temp: 98 F (36.7 C) 98.7 F (37.1 C)  98 F (36.7 C)  TempSrc: Oral Oral    SpO2: 98% 97%  99%  Weight:      Height:        Intake/Output Summary (Last 24 hours) at 02/16/2020 0957 Last data filed at 02/16/2020 0439 Gross per 24 hour  Intake 120 ml  Output 700 ml  Net -580 ml   Filed Weights   02/13/20 1407  Weight: 64.9 kg    Telemetry    SR LBBB with frequent PVCs in bigeminy and trigeminy - Personally Reviewed  ECG    No new - Personally Reviewed  Physical Exam  * GEN: No acute distress.   Neck: No JVD Cardiac: RRR with frequent extra  systolic beats, II/VI systolic murmur, rubs, or gallops.  Respiratory: Clear to auscultation bilaterally. GI: Soft, nontender, non-distended  MS: No edema; No deformity. Neuro:  Nonfocal  Psych: Normal affect   Labs    Chemistry Recent Labs  Lab 02/13/20 1414 02/14/20 0213 02/16/20 0621  NA 144 145 146*  K 3.5 3.4* 4.5  CL 105 109 112*  CO2 21* 22 23  GLUCOSE 109* 207* 117*  BUN 103* 104* 99*  CREATININE 4.98* 4.99* 4.69*  CALCIUM 8.5* 8.5* 8.7*  PROT  --   --  5.5*  ALBUMIN  --   --  3.1*  AST  --   --  16  ALT  --   --  12  ALKPHOS  --   --  58  BILITOT  --   --  0.6  GFRNONAA 8* 8* 9*  ANIONGAP 18* 14 11     Hematology Recent Labs  Lab 02/13/20 1414 02/13/20 1414 02/14/20 0213 02/14/20 1708 02/16/20 0621  WBC 3.4*  --  3.6*  --  4.4  RBC 3.55*   < > 3.31* 3.54* 3.39*  HGB 11.0*  --  10.7*  --  10.6*  HCT 36.9  --  34.7*  --  34.7*  MCV 103.9*  --  104.8*  --  102.4*  MCH 31.0  --  32.3  --  31.3  MCHC 29.8*  --  30.8  --  30.5  RDW 16.5*  --  16.7*  --  16.1*  PLT 121*  --  126*  --  105*   < > = values in this interval not displayed.    Cardiac EnzymesNo results for input(s): TROPONINI in the last 168 hours. No results for input(s): TROPIPOC in the last 168 hours.   BNP Recent Labs  Lab 02/13/20 1414 02/15/20 1118  BNP 3,370.2* 4,429.6*     DDimer No results for input(s): DDIMER in the last 168 hours.   Radiology    DG Chest 1 View  Result Date: 02/16/2020 CLINICAL DATA:  Shortness of breath. EXAM: CHEST  1 VIEW COMPARISON:  02/13/2020 FINDINGS: Cardiac enlargement. Aortic atherosclerotic calcifications. Pulmonary vascular congestion. No pleural effusion or edema no airspace densities. IMPRESSION: Cardiac enlargement and pulmonary vascular congestion. Electronically Signed   By: Kerby Moors M.D.   On: 02/16/2020 07:06   ECHOCARDIOGRAM COMPLETE  Result Date: 02/14/2020    ECHOCARDIOGRAM REPORT   Patient Name:   Doris Howell Date  of Exam: 02/14/2020 Medical Rec #:  [id number]            Height:       61.0 in Accession #:    [id number]           Weight:       143.0 lb Date of Birth:  07-03-1935             BSA:          1.638 m Patient Age:    78 years             BP:           130/53 mmHg Patient Gender: F                    HR:           45 bpm. Exam Location:  Inpatient Procedure: 2D Echo, Cardiac Doppler and Color Doppler Indications:    Dyspnea 786.09 / R06.00  History:        Patient has prior history of Echocardiogram examinations, most                 recent 05/09/2018. CHF; Risk Factors:Hypertension, Diabetes and                 Dyslipidemia.  Sonographer:    Jonelle Sidle Dance Referring Phys: [id number] [place]  1. LVEF is severely depressed The lateral wall thickens the best; other walls are severly hypokinetic. Left ventricular ejection fraction, by estimation, is 25 to 30%. The left ventricular internal cavity size was mildly dilated. Left ventricular diastolic parameters are indeterminate.  2. Right ventricular systolic function is mildly reduced. The right ventricular size is mildly enlarged. There is moderately elevated pulmonary artery systolic pressure.  3. Left atrial size was severely dilated.  4. Right atrial size was severely dilated.  5. Moderate mitral valve regurgitation.  6. Tricuspid valve regurgitation is mild to moderate.  7. The aortic valve is normal in structure. Aortic valve regurgitation is not visualized.  8. The inferior vena cava is dilated in size with >50% respiratory variability, suggesting right atrial pressure of 8 mmHg. FINDINGS  Left Ventricle: LVEF is severely depressed The lateral wall thickens the best; other walls are severly  hypokinetic. Left ventricular ejection fraction, by estimation, is 25 to 30%. The left ventricle has severely decreased function. The left ventricle demonstrates regional wall motion abnormalities. The left ventricular internal cavity size was mildly dilated.  There is no left ventricular hypertrophy. Left ventricular diastolic parameters are indeterminate. Right Ventricle: The right ventricular size is mildly enlarged. Right vetricular wall thickness was not assessed. Right ventricular systolic function is mildly reduced. There is moderately elevated pulmonary artery systolic pressure. The tricuspid regurgitant velocity is 3.09 m/s, and with an assumed right atrial pressure of 15 mmHg, the estimated right ventricular systolic pressure is 98.9 mmHg. Left Atrium: Left atrial size was severely dilated. Right Atrium: Right atrial size was severely dilated. Pericardium: Trivial pericardial effusion is present. Mitral Valve: The mitral valve is normal in structure. Moderate mitral valve regurgitation. Tricuspid Valve: The tricuspid valve is normal in structure. Tricuspid valve regurgitation is mild to moderate. Aortic Valve: The aortic valve is normal in structure. Aortic valve regurgitation is not visualized. Pulmonic Valve: The pulmonic valve was normal in structure. Pulmonic valve regurgitation is mild. Aorta: The aortic root is normal in size and structure. Venous: The inferior vena cava is dilated in size with greater than 50% respiratory variability, suggesting right atrial pressure of 8 mmHg. IAS/Shunts: No atrial level shunt detected by color flow Doppler.  LEFT VENTRICLE PLAX 2D LVIDd:         5.55 cm LVIDs:         4.59 cm LV PW:         1.04 cm LV IVS:        0.97 cm LVOT diam:     1.90 cm LV SV:         40 LV SV Index:   24 LVOT Area:     2.84 cm  RIGHT VENTRICLE          IVC RV Basal diam:  4.14 cm  IVC diam: 2.05 cm RV Mid diam:    2.93 cm TAPSE (M-mode): 1.9 cm LEFT ATRIUM              Index       RIGHT ATRIUM           Index LA diam:        4.40 cm  2.69 cm/m  RA Area:     37.90 cm LA Vol (A2C):   105.0 ml 64.11 ml/m RA Volume:   151.00 ml 92.20 ml/m LA Vol (A4C):   82.6 ml  50.43 ml/m LA Biplane Vol: 93.6 ml  57.15 ml/m  AORTIC VALVE LVOT Vmax:   70.20  cm/s LVOT Vmean:  43.650 cm/s LVOT VTI:    0.141 m  AORTA Ao Root diam: 3.40 cm Ao Asc diam:  3.10 cm MITRAL VALVE                TRICUSPID VALVE MV Area (PHT): 3.20 cm     TR Peak grad:   38.2 mmHg MV Decel Time: 237 msec     TR Vmax:        309.00 cm/s MV E velocity: 115.00 cm/s MV A velocity: 48.00 cm/s   SHUNTS MV E/A ratio:  2.40         Systemic VTI:  0.14 m                             Systemic Diam: 1.90 cm Dorris Carnes MD Electronically signed by Dorris Carnes  MD Signature Date/Time: 02/14/2020/6:43:14 PM    Final     Cardiac Studies   LVEF reduced with TR and MR as above.    Patient Profile     84 y.o. female CKD Stage V with worsening HFrEF and frequenct PVCs who wishes medical management.  Assessment & Plan    HFrEF with moderate MR CKD V HTN PVCs - ischemic remains on DDx, patient unamenable to LHC - Would return torsemide 20 mg if agree with nephrology and or primary; appears euvolemic and creatinine has improved - no ARNI b/c of CKD - continue BB, no room for increased dose (bradycardia) - Increasing hydralazine 50 mg TID (ordered) - if GOC change; would consider LHC but would likely need HD after  Discussed with patient and primary MD.  Patients primary goal of care is to be as well as she can without anything that could potentially lead to HD.  Will take conservative tx of of HFrEF, MR, and PVCs.  Patient and family aware of risk of this approach.  [facility] will sign off.   Medication Recommendations as above Other recommendations (labs, testing, etc):  BMET in one week; nephrology follow up Follow up as an outpatient:  Arranging f/u with Dr. Acie Fredrickson   For questions or updates, please contact [place] Please consult [email or web address] for contact info under Cardiology/STEMI.      Signed, Werner Lean, MD  02/16/2020, 9:57 AM

## 2020-02-16 NOTE — Progress Notes (Addendum)
Nurse paged because she had received call from telemetry that patient's HR has continued to run in the 40s frequently with occasional PVCs, also noted to have intermittent changes in QRS duration. The patient has been trying to sleep during this time (not yet asleep); no acute changes in clinical status otherwise. I personally reviewed telemetry which does not appear significantly different than earlier today - NSR with occasional PVCs. At baseline she has a LBBB. I do see where her QRS intermittently gets more narrow compared to baseline but she remains NSR during this time. Therefore it would seem as though her LBBB may actually be intermittent. This has not been captured on EKG. Will repeat EKG now to trend. Earlier today Dr. Gasper Sells recommended to reduce carvedilol from 6.25mg  to 3.125mg  BID and increased hydralazine. She had gotten the carvedilol 6.25mg  dose this morning and is coming due soon for the next dose. Given that HR still intermittently <50bpm, will hold this afternoon's dose and move the new lower dose to begin tomorrow. Follow BP and titrate hydralazine if needed. Will add back to rounding list for tele check in AM.  Edited to add - EKG confirms continued NSR with LBBB & PVCs, does capture the beats where she intermittently goes out of the bundle into a more narrow QRS complex (with associated nonspecific STT changes). No change in plan as outlined above.  Docia Klar PA-C

## 2020-02-16 NOTE — Progress Notes (Signed)
PROGRESS NOTE    Doris Howell  XNA:[id number] DOB: 1935-12-15 DOA: 02/13/2020  PCP: Lavone Orn, MD    Brief Narrative:  Pt is a full code 84 y./o women admitted on 02/13/2020 for SOB and LE edema. Pt states she does not and her heart  md dr told her heart heart is. Good/ I d/w that it is critical that she stay off sodium. She has a  past medical history of CHF/CKD/Pulmonarty htn and was admitted for her chf.  Assessment & Plan: Acute on chronic  exacerbation of systolic CHF (congestive heart failure) ([place]): Pt has echo that showed about 40 # EF and we will repeat echo dn also cardiology has been consulted.Stric I/t.Monitor electrollytes and replace.Cont home regimen of meds as follows: asa/statin/ ace or arb once euvolemic.  Intake/Output Summary (Last 24 hours) at 02/16/2020 1522 Last data filed at 02/16/2020 1358 Gross per 24 hour  Intake --  Output 775 ml  Net -775 ml   Have changed lasix to 60 mg q 12h for today and we will reassess and redose in am.  I suspect pt will need at lead  another 24-48 hours of hospital monitoring for her A/C on C/H systolic CHF with reduced EF and echo is pending.  SpO2: 99 % on RA and heart rate has been bradycardic, and cardiology has been consulted.  02/15/20 Pt sen with cardiology at bedside pt is declining cath because of renal complication risk.   02/16/20 Pt seen today by nephrology and cardiology and we greatly appreciate specialist management and care. Pt is started on renal dose diuretic therapy for her volume overload.   Bradycardia: Monitoring on telemetry . TFT's. Electrolyte checks.  02/15/20 Cont coreg at same dose pt is asymptomatic.    CKD (chronic kidney disease), stage IV (HCC) Lab Results  Component Value Date   CREATININE 4.69 (H) 02/16/2020   CREATININE 4.99 (H) 02/14/2020   CREATININE 4.98 (H) 02/13/2020  Avoid nephrotoxic agents and renall dose meds. Change Lovenox to heparin .  02/15/20 Labs in  am.  02/16/20 Lab Results  Component Value Date   CREATININE 4.69 (H) 02/16/2020   CREATININE 4.99 (H) 02/14/2020   CREATININE 4.98 (H) 02/13/2020     Essential hypertension Cont coreg. Cardiac low sodium diet.  Blood pressure (!) 160/76, pulse 67, temperature 98 F (36.7 C), temperature source Oral, resp. rate 20, height 5\' 1"  (1.549 m), weight 64.9 kg, SpO2 99 %. 02/15/20 BP is stable, pt is asymptomatic. 02/16/20 hydralazine tid per cardiology for her htn.   Gout cont allopurinol for hyperuricemia from ckd and gout.   Anemia: Anemia present since 2008 in our charts: Suspect ACD will collect anemia panel . Type/cross/transfues if hb is 8 or below.     DVT prophylaxis: Heparin    Code Status: Full Code  Family Communication: None at bedside. Disposition Plan: TBD   Status is: Inpatient  Remains inpatient appropriate because:Inpatient level of care appropriate due to severity of illness   Dispo: The patient is from: Home              Anticipated d/c is to: Home              Anticipated d/c date is: 2 days              Patient currently is not medically stable to d/c. Consultants:  Cardiology Consult- Dr. Dwyane Dee. Nephrology Consult- Dr. Jonnie Finner.  Procedures:  2 d echocardigram.  Subjective: Pt admitted and is  day 1 today she is stable and converses. Pt Denis any complaint otherwise.  Pt was stable able to finish her sentences, she states she is complaint with diet.   02/15/20 Pt is stable today swelling is resolved. On RA.No complaints of chest pain pressure or sob or otherwise.  Echo shows severely depressed LV function.    02/16/20 Pt looks despondent and is sad about her current situation and does not want to risk her kidney and end up a dialysis pt.   Objective: Vitals:   02/16/20 0900 02/16/20 0925 02/16/20 1127 02/16/20 1351  BP:  (!) 154/60 (!) 132/56 (!) 160/76  Pulse:  (!) 56 67 67  Resp: 20 20 18 20   Temp:  98 F (36.7 C) 98 F  (36.7 C) 98 F (36.7 C)  TempSrc:   Oral Oral  SpO2:  99% 100% 99%  Weight:      Height:       SpO2: 99 %  Intake/Output Summary (Last 24 hours) at 02/16/2020 1522 Last data filed at 02/16/2020 1358 Gross per 24 hour  Intake --  Output 775 ml  Net -775 ml   Filed Weights   02/13/20 1407  Weight: 64.9 kg    Examination: Blood pressure (!) 160/76, pulse 67, temperature 98 F (36.7 C), temperature source Oral, resp. rate 20, height 5\' 1"  (1.549 m), weight 64.9 kg, SpO2 99 %. General exam: Appears calm and comfortable  HEENT:EOMI, perrl  Respiratory system: CLTABL Cardiovascular system: regular .  No pedal edema . Gastrointestinal system: Abdomen is obese, nondistended, soft and nontender. No organomegaly or masses felt. Normal bowel sounds heard. Central nervous system: Alert and oriented.CN grossly intact No focal neurological deficits. Extremities: pt moving all 4 ext and ambulating. Skin: No rashes, lesions or ulcers Psychiatry: Judgement and insight appear normal. Mood & affect appropriate.   Data Reviewed: I have personally reviewed following labs and imaging studies  I/O last 3 completed shifts: In: 240 [P.O.:240] Out: 1800 [Urine:1800] Total I/O In: -  Out: 75 [Urine:75] Lab Results  Component Value Date   CREATININE 4.69 (H) 02/16/2020   CREATININE 4.99 (H) 02/14/2020   CREATININE 4.98 (H) 02/13/2020   CBC: Recent Labs  Lab 02/13/20 1414 02/14/20 0213 02/16/20 0621  WBC 3.4* 3.6* 4.4  NEUTROABS  --   --  2.9  HGB 11.0* 10.7* 10.6*  HCT 36.9 34.7* 34.7*  MCV 103.9* 104.8* 102.4*  PLT 121* 126* 924*   Basic Metabolic Panel: Recent Labs  Lab 02/13/20 1414 02/13/20 1920 02/14/20 0213 02/14/20 1708 02/16/20 0621  NA 144  --  145  --  146*  K 3.5  --  3.4*  --  4.5  CL 105  --  109  --  112*  CO2 21*  --  22  --  23  GLUCOSE 109*  --  207*  --  117*  BUN 103*  --  104*  --  99*  CREATININE 4.98*  --  4.99*  --  4.69*  CALCIUM 8.5*  --  8.5*   --  8.7*  MG  --  2.0  --  2.1 2.0  PHOS  --   --   --   --  5.6*   GFR: Estimated Creatinine Clearance: 8 mL/min (A) (by C-G formula based on SCr of 4.69 mg/dL (H)). Liver Function Tests: Recent Labs  Lab 02/16/20 0621  AST 16  ALT 12  ALKPHOS 58  BILITOT 0.6  PROT 5.5*  ALBUMIN 3.1*  No results for input(s): LIPASE, AMYLASE in the last 168 hours. No results for input(s): AMMONIA in the last 168 hours.  Coagulation Profile: No results for input(s): INR, PROTIME in the last 168 hours. Cardiac Enzymes: No results for input(s): CKTOTAL, CKMB, CKMBINDEX, TROPONINI in the last 168 hours. BNP (last 3 results) No results for input(s): PROBNP in the last 8760 hours. HbA1C: No results for input(s): HGBA1C in the last 72 hours. CBG: Recent Labs  Lab 02/13/20 1412  GLUCAP 102*   Lipid Profile: No results for input(s): CHOL, HDL, LDLCALC, TRIG, CHOLHDL, LDLDIRECT in the last 72 hours. Thyroid Function Tests: Recent Labs    02/14/20 1708  TSH 2.609  FREET4 0.84   Anemia Panel: Recent Labs    02/14/20 1708  VITAMINB12 531  FOLATE 21.7  FERRITIN 193  TIBC 283  IRON 68  RETICCTPCT 1.8   Sepsis Labs: No results for input(s): PROCALCITON, LATICACIDVEN in the last 168 hours.  Recent Results (from the past 240 hour(s))  Resp Panel by RT PCR (RSV, Flu A&B, Covid) - Nasopharyngeal Swab     Status: None   Collection Time: 02/13/20  3:45 PM   Specimen: Nasopharyngeal Swab  Result Value Ref Range Status   SARS Coronavirus 2 by RT PCR NEGATIVE NEGATIVE Final    Comment: (NOTE) SARS-CoV-2 target nucleic acids are NOT DETECTED.  The SARS-CoV-2 RNA is generally detectable in upper respiratoy specimens during the acute phase of infection. The lowest concentration of SARS-CoV-2 viral copies this assay can detect is 131 copies/mL. A negative result does not preclude SARS-Cov-2 infection and should not be used as the sole basis for treatment or other patient management  decisions. A negative result may occur with  improper specimen collection/handling, submission of specimen other than nasopharyngeal swab, presence of viral mutation(s) within the areas targeted by this assay, and inadequate number of viral copies (<131 copies/mL). A negative result must be combined with clinical observations, patient history, and epidemiological information. The expected result is Negative.  Fact Sheet for Patients:  PinkCheek.be  Fact Sheet for Healthcare Providers:  GravelBags.it  This test is no t yet approved or cleared by the [place] FDA and  has been authorized for detection and/or diagnosis of SARS-CoV-2 by FDA under an Emergency Use Authorization (EUA). This EUA will remain  in effect (meaning this test can be used) for the duration of the COVID-19 declaration under Section 564(b)(1) of the Act, 21 U.S.C. section 360bbb-3(b)(1), unless the authorization is terminated or revoked sooner.     Influenza A by PCR NEGATIVE NEGATIVE Final   Influenza B by PCR NEGATIVE NEGATIVE Final    Comment: (NOTE) The Xpert Xpress SARS-CoV-2/FLU/RSV assay is intended as an aid in  the diagnosis of influenza from Nasopharyngeal swab specimens and  should not be used as a sole basis for treatment. Nasal washings and  aspirates are unacceptable for Xpert Xpress SARS-CoV-2/FLU/RSV  testing.  Fact Sheet for Patients: PinkCheek.be  Fact Sheet for Healthcare Providers: GravelBags.it  This test is not yet approved or cleared by the [place] FDA and  has been authorized for detection and/or diagnosis of SARS-CoV-2 by  FDA under an Emergency Use Authorization (EUA). This EUA will remain  in effect (meaning this test can be used) for the duration of the  Covid-19 declaration under Section 564(b)(1) of the Act, 21  U.S.C. section 360bbb-3(b)(1), unless the  authorization is  terminated or revoked.    Respiratory Syncytial Virus by PCR NEGATIVE NEGATIVE Final  Comment: (NOTE) Fact Sheet for Patients: PinkCheek.be  Fact Sheet for Healthcare Providers: GravelBags.it  This test is not yet approved or cleared by the [place] FDA and  has been authorized for detection and/or diagnosis of SARS-CoV-2 by  FDA under an Emergency Use Authorization (EUA). This EUA will remain  in effect (meaning this test can be used) for the duration of the  COVID-19 declaration under Section 564(b)(1) of the Act, 21 U.S.C.  section 360bbb-3(b)(1), unless the authorization is terminated or  revoked. Performed at [facility], [place] [address] [place], [place] [address]       Radiology Studies: DG Chest 1 View  Result Date: 02/16/2020 CLINICAL DATA:  Shortness of breath. EXAM: CHEST  1 VIEW COMPARISON:  02/13/2020 FINDINGS: Cardiac enlargement. Aortic atherosclerotic calcifications. Pulmonary vascular congestion. No pleural effusion or edema no airspace densities. IMPRESSION: Cardiac enlargement and pulmonary vascular congestion. Electronically Signed   By: Kerby Moors M.D.   On: 02/16/2020 07:06   ECHOCARDIOGRAM COMPLETE  Result Date: 02/14/2020    ECHOCARDIOGRAM REPORT   Patient Name:   ASPEN LAWRANCE Date of Exam: 02/14/2020 Medical Rec #:  [id number]            Height:       61.0 in Accession #:    [id number]           Weight:       143.0 lb Date of Birth:  [birth year]             BSA:          1.638 m Patient Age:    65 years             BP:           130/53 mmHg Patient Gender: F                    HR:           45 bpm. Exam Location:  Inpatient Procedure: 2D Echo, Cardiac Doppler and Color Doppler Indications:    Dyspnea 786.09 / R06.00  History:        Patient has prior history of Echocardiogram examinations, most                 recent 05/09/2018. CHF; Risk Factors:Hypertension,  Diabetes and                 Dyslipidemia.  Sonographer:    Jonelle Sidle Dance Referring Phys: [id number] [place]  1. LVEF is severely depressed The lateral wall thickens the best; other walls are severly hypokinetic. Left ventricular ejection fraction, by estimation, is 25 to 30%. The left ventricular internal cavity size was mildly dilated. Left ventricular diastolic parameters are indeterminate.  2. Right ventricular systolic function is mildly reduced. The right ventricular size is mildly enlarged. There is moderately elevated pulmonary artery systolic pressure.  3. Left atrial size was severely dilated.  4. Right atrial size was severely dilated.  5. Moderate mitral valve regurgitation.  6. Tricuspid valve regurgitation is mild to moderate.  7. The aortic valve is normal in structure. Aortic valve regurgitation is not visualized.  8. The inferior vena cava is dilated in size with >50% respiratory variability, suggesting right atrial pressure of 8 mmHg. FINDINGS  Left Ventricle: LVEF is severely depressed The lateral wall thickens the best; other walls are severly hypokinetic. Left ventricular ejection fraction, by estimation, is 25 to 30%. The left ventricle has severely  decreased function. The left ventricle demonstrates regional wall motion abnormalities. The left ventricular internal cavity size was mildly dilated. There is no left ventricular hypertrophy. Left ventricular diastolic parameters are indeterminate. Right Ventricle: The right ventricular size is mildly enlarged. Right vetricular wall thickness was not assessed. Right ventricular systolic function is mildly reduced. There is moderately elevated pulmonary artery systolic pressure. The tricuspid regurgitant velocity is 3.09 m/s, and with an assumed right atrial pressure of 15 mmHg, the estimated right ventricular systolic pressure is 06.2 mmHg. Left Atrium: Left atrial size was severely dilated. Right Atrium: Right atrial size was  severely dilated. Pericardium: Trivial pericardial effusion is present. Mitral Valve: The mitral valve is normal in structure. Moderate mitral valve regurgitation. Tricuspid Valve: The tricuspid valve is normal in structure. Tricuspid valve regurgitation is mild to moderate. Aortic Valve: The aortic valve is normal in structure. Aortic valve regurgitation is not visualized. Pulmonic Valve: The pulmonic valve was normal in structure. Pulmonic valve regurgitation is mild. Aorta: The aortic root is normal in size and structure. Venous: The inferior vena cava is dilated in size with greater than 50% respiratory variability, suggesting right atrial pressure of 8 mmHg. IAS/Shunts: No atrial level shunt detected by color flow Doppler.  LEFT VENTRICLE PLAX 2D LVIDd:         5.55 cm LVIDs:         4.59 cm LV PW:         1.04 cm LV IVS:        0.97 cm LVOT diam:     1.90 cm LV SV:         40 LV SV Index:   24 LVOT Area:     2.84 cm  RIGHT VENTRICLE          IVC RV Basal diam:  4.14 cm  IVC diam: 2.05 cm RV Mid diam:    2.93 cm TAPSE (M-mode): 1.9 cm LEFT ATRIUM              Index       RIGHT ATRIUM           Index LA diam:        4.40 cm  2.69 cm/m  RA Area:     37.90 cm LA Vol (A2C):   105.0 ml 64.11 ml/m RA Volume:   151.00 ml 92.20 ml/m LA Vol (A4C):   82.6 ml  50.43 ml/m LA Biplane Vol: 93.6 ml  57.15 ml/m  AORTIC VALVE LVOT Vmax:   70.20 cm/s LVOT Vmean:  43.650 cm/s LVOT VTI:    0.141 m  AORTA Ao Root diam: 3.40 cm Ao Asc diam:  3.10 cm MITRAL VALVE                TRICUSPID VALVE MV Area (PHT): 3.20 cm     TR Peak grad:   38.2 mmHg MV Decel Time: 237 msec     TR Vmax:        309.00 cm/s MV E velocity: 115.00 cm/s MV A velocity: 48.00 cm/s   SHUNTS MV E/A ratio:  2.40         Systemic VTI:  0.14 m                             Systemic Diam: 1.90 cm Dorris Carnes MD Electronically signed by Dorris Carnes MD Signature Date/Time: 02/14/2020/6:43:14 PM    Final    Current Facility-Administered Medications (Cardiovascular):   .  carvedilol (COREG) tablet 6.25 mg  Current Outpatient Medications (Cardiovascular):  .  carvedilol (COREG) 6.25 MG tablet, Take 6.25 mg by mouth 2 (two) times daily with a meal. .  hydrALAZINE (APRESOLINE) 25 MG tablet, Take 25 mg by mouth 2 (two) times daily.  .  nitroGLYCERIN (NITROSTAT) 0.4 MG SL tablet, Place 1 tablet (0.4 mg total) under the tongue every 5 (five) minutes as needed for chest pain. [name]  torsemide (DEMADEX) 20 MG tablet, Take 1-2 tablets (20-40 mg total) by mouth 2 (two) times daily. Take 2 tabs daily at 8 AM and 1 tab daily after lunch at 1 PM. (Patient taking differently: Take 80 mg by mouth daily. )  Current Facility-Administered Medications (Respiratory):  .  albuterol (VENTOLIN HFA) 108 (90 Base) MCG/ACT inhaler 2 puff  Current Outpatient Medications (Respiratory):  .  albuterol (PROVENTIL HFA;VENTOLIN HFA) 108 (90 BASE) MCG/ACT inhaler, Inhale 2 puffs into the lungs every 6 (six) hours as needed for wheezing or shortness of breath.  Current Facility-Administered Medications (Analgesics):  .  acetaminophen (TYLENOL) tablet 650 mg **OR** acetaminophen (TYLENOL) suppository 650 mg  .  allopurinol (ZYLOPRIM) tablet 100 mg .  aspirin EC tablet 81 mg  Current Outpatient Medications (Analgesics):  [name]  Acetaminophen (TYLENOL ARTHRITIS PAIN PO), Take 2 tablets by mouth daily as needed (pain). [name]  allopurinol (ZYLOPRIM) 100 MG tablet, Take 100 mg by mouth 2 (two) times daily.  [name]  aspirin EC 81 MG tablet, Take 81 mg by mouth daily.  Current Facility-Administered Medications (Hematological):  .  enoxaparin (LOVENOX) injection 30 mg   Current Facility-Administered Medications (Other):  .  latanoprost (XALATAN) 0.005 % ophthalmic solution 1 drop .  pantoprazole (PROTONIX) EC tablet 40 mg .  sodium chloride flush (NS) 0.9 % injection 3 mL  Current Outpatient Medications (Other):  .  esomeprazole (NEXIUM) 40 MG capsule, Take 40 mg by mouth daily at 12 noon. .  latanoprost  (XALATAN) 0.005 % ophthalmic solution, Place 1 drop into both eyes at bedtime. .  Vitamin D, Cholecalciferol, 400 UNITS CAPS, Take 2 capsules by mouth daily.  Anti-infectives (From admission, onward)   None      Scheduled Meds: . allopurinol  100 mg Oral BID  . aspirin EC  81 mg Oral Daily  . carvedilol  6.25 mg Oral BID WC  . heparin injection (subcutaneous)  5,000 Units Subcutaneous Q8H  . hydrALAZINE  50 mg Oral Q8H  . latanoprost  1 drop Both Eyes QHS  . pantoprazole  40 mg Oral Daily  . sodium chloride flush  3 mL Intravenous Q12H    LOS: 3 days    Para Skeans, MD [facility] Pager [phone] If 7PM-7AM, please contact night-coverage [email or web address] Password [facility] 02/16/2020, 3:22 PM

## 2020-02-16 NOTE — Consult Note (Addendum)
Renal Service Consult Note [facility]  Doris Howell 02/16/2020 Sol Blazing, MD Requesting Physician: Dr Girard Cooter  Reason for Consult: Advanced CKD HPI: The patient is a 84 y.o. year-old w/ hx of chron syst CHF EF 35%, CKD 5, h/o L nephrectomy, LBBB, obesity, OSA on CPAP, HTN, HL admitted 11/4 for SOB.  ECHO showed mod TR and MR w/ worsening EF. LHC offered 11/5 by cards but pt declined due to need for IV contrast and pt's desire not to do dialysis. We are asked to see for renal failure.   Pt seen in room, a son is present as well.  Pt denies any N/V, loss of appetite, confusion, somnolence or jerking of extremities.    ROS  denies CP  no joint pain   no HA  no blurry vision  no rash  no diarrhea  no nausea/ vomiting  no dysuria  no difficulty voiding  no change in urine color    Past Medical History  Past Medical History:  Diagnosis Date  . CAP (community acquired pneumonia) 01/09/2014   "first time I've ever had it"  . Diastolic congestive heart failure ([place])   . GERD (gastroesophageal reflux disease)   . High cholesterol   . History of gout   . Hypertension   . Kidney stones   . Type II diabetes mellitus with stage 4 chronic kidney disease [facility])    Past Surgical History  Past Surgical History:  Procedure Laterality Date  . ABDOMINAL HYSTERECTOMY  ~ 1974  . CATARACT EXTRACTION W/ INTRAOCULAR LENS  IMPLANT, BILATERAL Bilateral 2014  . COLONOSCOPY WITH PROPOFOL N/A 10/23/2012   Procedure: COLONOSCOPY WITH PROPOFOL;  Surgeon: [name], MD;  Location: [facility] ENDOSCOPY;  Service: Endoscopy;  Laterality: N/A;  . KIDNEY STONE SURGERY  10-05-12   "cut me on the side"  . LITHOTRIPSY  1980's?  . RIGHT HEART CATHETERIZATION N/A 04/15/2014   Procedure: RIGHT HEART CATH;  Surgeon: Sanda Klein, MD;  [facility];  Service: Cardiovascular;  Laterality: N/A;   Family History  Family History  Problem Relation Age of Onset  . Hypertension  Mother   . Diabetes Brother   . CAD Neg Hx    Social History  reports that she has never smoked. She has never used smokeless tobacco. She reports that she does not drink alcohol and does not use drugs. Allergies  Allergies  Allergen Reactions  . Other Nausea Only    pholcodeine  . Simvastatin Other (See Comments)    Myalgias- MD took patient off med  . Codeine Other (See Comments)    Unknown; pt can't remember. It was in the '70s    Home medications Prior to Admission medications   Medication Sig Start Date End Date Taking? Authorizing Provider  Acetaminophen (TYLENOL ARTHRITIS PAIN PO) Take 2 tablets by mouth daily as needed (pain).   Yes [provider]  albuterol (PROVENTIL HFA;VENTOLIN HFA) 108 (90 BASE) MCG/ACT inhaler Inhale 2 puffs into the lungs every 6 (six) hours as needed for wheezing or shortness of breath. 01/13/14  Yes [name], MD  allopurinol (ZYLOPRIM) 100 MG tablet Take 100 mg by mouth 2 (two) times daily.  06/28/19  Yes [provider]  aspirin EC 81 MG tablet Take 81 mg by mouth daily.   Yes [provider]  carvedilol (COREG) 6.25 MG tablet Take 6.25 mg by mouth 2 (two) times daily with a meal.   Yes [provider]  esomeprazole (NEXIUM) 40 MG capsule Take 40 mg by mouth daily at 12 noon.   Yes [provider]  hydrALAZINE (APRESOLINE) 25 MG tablet Take 25 mg by mouth 2 (two) times daily.    Yes [provider]  latanoprost (XALATAN) 0.005 % ophthalmic solution Place 1 drop into both eyes at bedtime.   Yes [provider]  nitroGLYCERIN (NITROSTAT) 0.4 MG SL tablet Place 1 tablet (0.4 mg total) under the tongue every 5 (five) minutes as needed for chest pain. 04/24/18  Yes Nahser, Wonda Cheng, MD  torsemide (DEMADEX) 20 MG tablet Take 1-2 tablets (20-40 mg total) by mouth 2 (two) times daily. Take 2 tabs daily at 8 AM and 1 tab daily after lunch at 1 PM. Patient taking differently: Take 80 mg by mouth  daily.  09/11/14  Yes Hongalgi, Lenis Dickinson, MD  Vitamin D, Cholecalciferol, 400 UNITS CAPS Take 2 capsules by mouth daily.   Yes [provider]     Vitals:   02/16/20 0436 02/16/20 0744 02/16/20 0900 02/16/20 0925  BP: (!) 162/72 (!) 136/49  (!) 154/60  Pulse: 66 64  (!) 56  Resp: (!) _0 Temp: 98 F (36.7 C) 98.7 F (37.1 C)  98 F (36.7 C)  TempSrc: Oral Oral    SpO2: 98% 97%  99%  Weight:      Height:       Exam Gen alert, eating lunch, joking No rash, cyanosis or gangrene Sclera anicteric, throat clear  No jvd or bruits Chest occ rales at bases, ow/ clear RRR no MRG Abd soft ntnd no mass or ascites +bs GU purewick draining clear urine MS no joint effusions or deformity Ext 2+ hip and pretib edema L > R Neuro is alert, Ox 3 , nf, no asterixis    Home meds:  - coreg 6.25 bid/ hydralazine 25 bid/ demadex 6m am+ 20pm  - asa qd/ sl ntg prn  - nexium 40/ allopurinol 100 bid  - prn's/ vitamins/ supplements    Date   Creat  eGFR ml/min   September 26, 2019 3.78  12   October 24, 2019  4.07    Nov 21, 2019  4.47  10   Sept 9, 2021  4.14   Jan 16, 2020  4.51   Nov 4 - 7, 2021 4.69- 4.98 8- 9      Na 146  K 4.5  CO2 23  BUN 99  Cr 4.69  Alb 3.1  Ca 8.7  Phos 5.6      BNP > 4500   WBC 4k  Hb 10.6   plt 105k     UA negative      UNa, UCr pending     CXR 11/07 - IMPRESSION: Cardiac enlargement and pulmonary vascular congestion.     CT abd noncont - Urinary Tract: Right kidney shows no obstructive or inflammatory changes. The left kidney has been surgically removed. Dystrophic calcifications in the surgical bed are noted. Bladder is well distended.    Assessment/ Plan: 1. CKD stage V / solitary kidney/ hx L nephrectomy for staghorn calculus - creat pt is not grossly uremic, she is vol overloaded though w/ SOB and leg edema/ vasc congestion per CXR. Creat is mid to high 4's, it was high 3's in July this year. Suspect AKI d/t decomp diast CHF.  [facility]  She is leery about the whole dialysis situation, states she has  had relatives who "died on dialysis".  She saw [facility] in 2019 for AVF but never followed up. She is not interested in discussing the idea of doing dialysis eventually or the idea of doing dialysis planning/ access placement at this time.  Will cont ongoing diuresis, will ^ IV lasix to 100 mg q 12hr. Will follow.  2. HTN - BP's normal to high, coreg/ hydral 3. H/o diast CHF 4. H/o gout 5. Anemia ckd - Hb 10.6 6. DM 2- not on medication at this time      Kelly Splinter  MD 02/16/2020, 10:51 AM  Recent Labs  Lab 02/14/20 0213 02/16/20 0621  WBC 3.6* 4.4  HGB 10.7* 10.6*   Recent Labs  Lab 02/14/20 0213 02/16/20 0621  K 3.4* 4.5  BUN 104* 99*  CREATININE 4.99* 4.69*  CALCIUM 8.5* 8.7*  PHOS  --  5.6*

## 2020-02-17 DIAGNOSIS — M79604 Pain in right leg: Secondary | ICD-10-CM

## 2020-02-17 DIAGNOSIS — I5023 Acute on chronic systolic (congestive) heart failure: Secondary | ICD-10-CM | POA: Diagnosis not present

## 2020-02-17 DIAGNOSIS — I509 Heart failure, unspecified: Secondary | ICD-10-CM

## 2020-02-17 LAB — CBC
HCT: 39.4 % (ref 36.0–46.0)
Hemoglobin: 12 g/dL (ref 12.0–15.0)
MCH: 32 pg (ref 26.0–34.0)
MCHC: 30.5 g/dL (ref 30.0–36.0)
MCV: 105.1 fL — ABNORMAL HIGH (ref 80.0–100.0)
Platelets: 105 10*3/uL — ABNORMAL LOW (ref 150–400)
RBC: 3.75 MIL/uL — ABNORMAL LOW (ref 3.87–5.11)
RDW: 16.5 % — ABNORMAL HIGH (ref 11.5–15.5)
WBC: 3.9 10*3/uL — ABNORMAL LOW (ref 4.0–10.5)
nRBC: 0 % (ref 0.0–0.2)

## 2020-02-17 LAB — BLOOD GAS, ARTERIAL
Acid-base deficit: 2.4 mmol/L — ABNORMAL HIGH (ref 0.0–2.0)
Bicarbonate: 23 mmol/L (ref 20.0–28.0)
Drawn by: 535471
FIO2: 36
O2 Saturation: 95.4 %
Patient temperature: 36.6
pCO2 arterial: 46.6 mmHg (ref 32.0–48.0)
pH, Arterial: 7.312 — ABNORMAL LOW (ref 7.350–7.450)
pO2, Arterial: 78.9 mmHg — ABNORMAL LOW (ref 83.0–108.0)

## 2020-02-17 LAB — MAGNESIUM
Magnesium: 2.1 mg/dL (ref 1.7–2.4)
Magnesium: 2.2 mg/dL (ref 1.7–2.4)

## 2020-02-17 LAB — BASIC METABOLIC PANEL
Anion gap: 14 (ref 5–15)
BUN: 95 mg/dL — ABNORMAL HIGH (ref 8–23)
CO2: 20 mmol/L — ABNORMAL LOW (ref 22–32)
Calcium: 9.2 mg/dL (ref 8.9–10.3)
Chloride: 110 mmol/L (ref 98–111)
Creatinine, Ser: 4.33 mg/dL — ABNORMAL HIGH (ref 0.44–1.00)
GFR, Estimated: 10 mL/min — ABNORMAL LOW (ref >60)
Glucose, Bld: 106 mg/dL — ABNORMAL HIGH (ref 70–99)
Potassium: 5 mmol/L (ref 3.5–5.1)
Sodium: 144 mmol/L (ref 135–145)

## 2020-02-17 LAB — POTASSIUM: Potassium: 4.5 mmol/L (ref 3.5–5.1)

## 2020-02-17 LAB — LACTIC ACID, PLASMA: Lactic Acid, Venous: 0.9 mmol/L (ref 0.5–1.9)

## 2020-02-17 MED ORDER — ISOSORBIDE MONONITRATE ER 30 MG PO TB24
30.0000 mg | ORAL_TABLET | Freq: Every day | ORAL | Status: DC
  Administered 2020-02-17 – 2020-02-19 (×3): 30 mg via ORAL
  Filled 2020-02-17 (×3): qty 1

## 2020-02-17 MED ORDER — CHLORHEXIDINE GLUCONATE CLOTH 2 % EX PADS
6.0000 | MEDICATED_PAD | Freq: Every day | CUTANEOUS | Status: DC
  Administered 2020-02-17 – 2020-02-20 (×4): 6 via TOPICAL

## 2020-02-17 NOTE — Progress Notes (Signed)
Progress Note  Patient Name: Doris Howell Date of Encounter: 02/17/2020  [facility] Cardiologist: Mertie Moores, MD   Subjective   Patient very lethargic this morning. Able to answer some questions but not others. No chest pain. Unable to tell me how her breathing compares to yesterday but she is tachypneic with labored breathing. Still having frequent PVCs/PACs on telemetry.  Inpatient Medications    Scheduled Meds: . allopurinol  100 mg Oral BID  . aspirin EC  81 mg Oral Daily  . carvedilol  3.125 mg Oral BID WC  . heparin injection (subcutaneous)  5,000 Units Subcutaneous Q8H  . hydrALAZINE  50 mg Oral Q8H  . latanoprost  1 drop Both Eyes QHS  . pantoprazole  40 mg Oral Daily  . sodium chloride flush  3 mL Intravenous Q12H   Continuous Infusions: . furosemide 100 mg (02/17/20 0525)   PRN Meds: acetaminophen **OR** acetaminophen, albuterol   Vital Signs    Vitals:   02/16/20 1552 02/16/20 1903 02/16/20 2356 02/17/20 0739  BP: (!) 154/69 (!) 147/69 (!) 146/92 140/77  Pulse: 65 71 78 60  Resp: 20 17 16 20   Temp: 98 F (36.7 C) 98 F (36.7 C) (!) 97.5 F (36.4 C) 97.9 F (36.6 C)  TempSrc: Oral Oral Oral Oral  SpO2: 100% 100% 99% 98%  Weight:      Height:        Intake/Output Summary (Last 24 hours) at 02/17/2020 1100 Last data filed at 02/17/2020 0600 Gross per 24 hour  Intake 780 ml  Output 75 ml  Net 705 ml   Last 3 Weights 02/13/2020 12/31/2019 07/03/2019  Weight (lbs) 143 lb 148 lb 148 lb 8 oz  Weight (kg) 64.864 kg 67.132 kg 67.359 kg      Telemetry    Sinus rhythm with rates in the 50's to 70's. Frequent PVCs - Personally Reviewed  ECG    No new ECG tracing today. - Personally Reviewed  Physical Exam   GEN: No acute distress.   Neck: JVD elevated. Cardiac: Irregular with normal rate. No murmurs, rubs, or gallops.  Respiratory: Tachypneic with labored breathing. Crackles in lung bases.  GI: Soft, mildly distended, and  non-tender. MS: No significant lower extremity edema. No deformity. Skin: Warm and dry. Neuro:  No focal deficits. Lethargic this morning. Psych: Lethargic.  Labs    High Sensitivity Troponin:  No results for input(s): TROPONINIHS in the last 720 hours.    Chemistry Recent Labs  Lab 02/14/20 0213 02/16/20 0621 02/16/20 1658  NA 145 146* 144  K 3.4* 4.5 4.3  CL 109 112* 110  CO2 22 23 20*  GLUCOSE 207* 117* 133*  BUN 104* 99* 98*  CREATININE 4.99* 4.69* 4.56*  CALCIUM 8.5* 8.7* 8.9  PROT  --  5.5*  --   ALBUMIN  --  3.1*  --   AST  --  16  --   ALT  --  12  --   ALKPHOS  --  58  --   BILITOT  --  0.6  --   GFRNONAA 8* 9* 9*  ANIONGAP 14 11 14      Hematology Recent Labs  Lab 02/13/20 1414 02/13/20 1414 02/14/20 0213 02/14/20 1708 02/16/20 0621  WBC 3.4*  --  3.6*  --  4.4  RBC 3.55*   < > 3.31* 3.54* 3.39*  HGB 11.0*  --  10.7*  --  10.6*  HCT 36.9  --  34.7*  --  34.7*  MCV 103.9*  --  104.8*  --  102.4*  MCH 31.0  --  32.3  --  31.3  MCHC 29.8*  --  30.8  --  30.5  RDW 16.5*  --  16.7*  --  16.1*  PLT 121*  --  126*  --  105*   < > = values in this interval not displayed.    BNP Recent Labs  Lab 02/13/20 1414 02/15/20 1118 02/16/20 0621  BNP 3,370.2* 4,429.6* >4,500.0*     DDimer No results for input(s): DDIMER in the last 168 hours.   Radiology    DG Chest 1 View  Result Date: 02/16/2020 CLINICAL DATA:  Shortness of breath. EXAM: CHEST  1 VIEW COMPARISON:  02/13/2020 FINDINGS: Cardiac enlargement. Aortic atherosclerotic calcifications. Pulmonary vascular congestion. No pleural effusion or edema no airspace densities. IMPRESSION: Cardiac enlargement and pulmonary vascular congestion. Electronically Signed   By: Kerby Moors M.D.   On: 02/16/2020 07:06    Cardiac Studies   Echocardiogram 02/14/2020: Impressions: 1. LVEF is severely depressed The lateral wall thickens the best; other  walls are severly hypokinetic. Left ventricular ejection  fraction, by  estimation, is 25 to 30%. The left ventricular internal cavity size was  mildly dilated. Left ventricular  diastolic parameters are indeterminate.  2. Right ventricular systolic function is mildly reduced. The right  ventricular size is mildly enlarged. There is moderately elevated  pulmonary artery systolic pressure.  3. Left atrial size was severely dilated.  4. Right atrial size was severely dilated.  5. Moderate mitral valve regurgitation.  6. Tricuspid valve regurgitation is mild to moderate.  7. The aortic valve is normal in structure. Aortic valve regurgitation is  not visualized.  8. The inferior vena cava is dilated in size with >50% respiratory  variability, suggesting right atrial pressure of 8 mmHg.  Patient Profile     84 y.o. female with a history of chronic combined CHF with EF of 35-40% on Echo in 04/2018, pulmonary hypertension with biventricular failure hypertension, LBBB, hyperlipidemia, type 2 diabetes mellitus, solitary kidney s/p left nephrectomy with CKD stage V, obstructive sleep apnea, and GERD who is being seen for evaluation of CHF after presenting with shortness of breath and bilateral lower extremity edema.  Assessment & Plan    Acute on Chronic Combined CHF - BNP trending up the last few days and >4,500 today. - Chest x-ray yesterday showed cardiac enlargement with pulmonary vascular congestion but no overt edema. - Echo showed LVEF of 25-30% (down from 35-40% in 04/2018). RV mildly enlarged with mildly reduced systolif function and moderately elevated PASP. Moderate MR and mild to moderate TR also noted. - Currently on IV Lasix 100mg  twice daily. Only 575 cc of documented urinary output yesterday. Net negative 2 L this admission. Weights not documented. Today's BMET pending. - Continue diuresis per Nephrology given renal function. - No ACEi/ARB/ARNI or Spironolactone due to renal function.  - Coreg 3.125mg  twice daily. - Continue  Hydralazine 50mg  three times daily.  - Will add Imdur 30mg  daily. - Need to monitor daily weights - will put in care order fort his. Continue to monitor daily I/O's and renal function.  - There is concern that ischemia is cause of drop in EF and frequent PVCs; however, is adamant about not wanting to be on dialysis. Given renal function, cardiac catheterization is not an option at this time.   Frequent PVCs - Telemetry shows sinus rhythm with baseline rates in the 50's to  70's with frequent PVCs/PACs. - Continue Coreg 3.125mg  twice daily. - Potassium and Magnesium levels pending for today.  Lethargic - Patient is very lethargic today which sounds like is new. - ABG showed pH of 7.312, pCO2 46.6, pO2 78.9.  - Will check lactic acid.  - Wonder if she could be uremic.  - Consider head CT but defer to primary team.  Hypertension - BP well controlled. - Continue Hydralazine as above.  Acute on CKD Stage V S/p Left Nephrectomy - Creatinine 4.98 on admission and down to 4.56 yesterday with diuresis. Today's labs pending. Baseline around 4.0 to 4.5. - Nephrology following. Patient does not want dialysis.  Otherwise, per primary team.  For questions or updates, please contact [place] Please consult [email or web address] for contact info under        Signed, Darreld Mclean, PA-C  02/17/2020, 11:00 AM

## 2020-02-17 NOTE — Progress Notes (Signed)
After 100 mg of IV lasix administered, no urine was obtained from pt.  Bladder scan performed of greater than 350 cc. Pt seems to be very uncomfortable and moaning as if she's in pain.  Provider notified, orders received to insert foley.

## 2020-02-17 NOTE — Progress Notes (Signed)
Admit: 02/13/2020 LOS: 4  The patient is a 84 y.o. year-old w/ hx of chron syst CHF EF 35%, CKD 5, h/o L nephrectomy, LBBB, obesity, OSA on CPAP, HTN, HL admitted 11/4 for SOB.  ECHO showed mod TR and MR w/ worsening EF 20-30%. LHC offered 11/5 by cards but pt declined due to need for IV contrast and pt's desire not to do dialysis.  Subjective:  [name] Per RN, no acute events overnight.  Patient more lethargic and responds to tactile stimulus.  She denies any chest pain, but endorses continued fatigue and SOB but has not worsened.  11/07 0701 - 11/08 0700 In: 1020 [P.O.:960; IV Piggyback:60] Out: 575 [Urine:575]  Filed Weights   02/13/20 1407  Weight: 64.9 kg    Scheduled Meds: . allopurinol  100 mg Oral BID  . aspirin EC  81 mg Oral Daily  . carvedilol  3.125 mg Oral BID WC  . heparin injection (subcutaneous)  5,000 Units Subcutaneous Q8H  . hydrALAZINE  50 mg Oral Q8H  . latanoprost  1 drop Both Eyes QHS  . pantoprazole  40 mg Oral Daily  . sodium chloride flush  3 mL Intravenous Q12H   Continuous Infusions: . furosemide 100 mg (02/17/20 0525)   PRN Meds Current Labs: reviewed BUN 98, Cr 4.56, Phos 5.5   Physical Exam:  Blood pressure 140/77, pulse 60, temperature 97.9 F (36.6 C), temperature source Oral, resp. rate 20, height 5\' 1"  (1.549 m), weight 64.9 kg, SpO2 98 %. GEN: lethargi ENT: no nasal discharge, mmm EYES: no scleral icterus, eomi CV: controlled rate, irregular rhythm, HR 61, no murmurs PULM: tachypnea, RR 20, on Oxygen 2L, diminished breath sounds bilaterally bilateral chest rise ABD: NABS, non-distended SKIN: no rashes or jaundice EXT: 2+ lower extremity edema, warm and well perfused   A/P  Acute on CKD stage V / solitary kidney/ hx L nephrectomy for staghorn calculus  Slight improvement in creatinine today. Continues to be volume overloaded with SOB and lower extremity edema.  Lung sounds diminished on exam.  Likely AKI secondary to decompensated CHF.    -Continue diuresis Lasix 100 mg q 12h -Monitor electrolytes and replete as needed -Daily BMP and Mag -Strict intake and output -Daily weight  Hypertension Normotensive. Continue current management  CHF Recent ECHO shows LVEF 20-30% with sever hypokinesis.  Sever dilation of both atria.  Moderate MR and  Mild -Mod TR  Anemia in CKD Hbg stable 10.6 Continue to monitor  DM type 2 Last A1c 5.9.  No documented medications Continue to monitor CBG with am labs  Carollee Leitz, MD [facility]  02/17/2020, 8:45 AM  Recent Labs  Lab 02/14/20 0213 02/16/20 0621 02/16/20 1658  NA 145 146* 144  K 3.4* 4.5 4.3  CL 109 112* 110  CO2 22 23 20*  GLUCOSE 207* 117* 133*  BUN 104* 99* 98*  CREATININE 4.99* 4.69* 4.56*  CALCIUM 8.5* 8.7* 8.9  PHOS  --  5.6* 5.5*   Recent Labs  Lab 02/13/20 1414 02/14/20 0213 02/16/20 0621  WBC 3.4* 3.6* 4.4  NEUTROABS  --   --  2.9  HGB 11.0* 10.7* 10.6*  HCT 36.9 34.7* 34.7*  MCV 103.9* 104.8* 102.4*  PLT 121* 126* 105*    Plan communicated to the primary team

## 2020-02-17 NOTE — Progress Notes (Signed)
PROGRESS NOTE    Doris Howell  JKK:[id number] DOB: [birth year] DOA: 02/13/2020  PCP: Lavone Orn, MD    Brief Narrative:  Pt is a full code 84 y./o women admitted on 02/13/2020 for SOB and LE edema. Pt states she does not and her heart  md dr told her heart heart is. Good/ I d/w that it is critical that she stay off sodium. She has a  past medical history of CHF/CKD/[place] htn and was admitted for her chf.  Assessment & Plan: Acute on chronic  exacerbation of systolic CHF (congestive heart failure) ([place]): Pt has echo that showed about 40 # EF and we will repeat echo dn also cardiology has been consulted.Stric I/t.Monitor electrollytes and replace.Cont home regimen of meds as follows: asa/statin/ ace or arb once euvolemic.  Intake/Output Summary (Last 24 hours) at 02/17/2020 1600 Last data filed at 02/17/2020 0800 Gross per 24 hour  Intake 660 ml  Output --  Net 660 ml   Have changed lasix to 60 mg q 12h for today and we will reassess and redose in am.  I suspect pt will need at lead  another 24-48 hours of hospital monitoring for her A/C on C/H systolic CHF with reduced EF and echo is pending.  SpO2: 100 % on RA and heart rate has been bradycardic, and cardiology has been consulted.  02/15/20 Pt sen with cardiology at bedside pt is declining cath because of renal complication risk.   02/16/20 Pt seen today by nephrology and cardiology and we greatly appreciate specialist management and care. Pt is started on renal dose diuretic therapy for her volume overload.    02/17/20 Pt seen today she was very sleep nurse thought she was having a stroke but pt woke and answered appropriately and no neuro deficit .renal dose lasix.   Bradycardia: Monitoring on telemetry . TFT's. Electrolyte checks.  02/15/20 Cont coreg at same dose pt is asymptomatic. 02/16/20 Pt coreg held  02/17/20 Coreg restarted at 3.125 mg bid.   CKD (chronic kidney disease), stage IV (HCC) Lab Results   Component Value Date   CREATININE 4.33 (H) 02/17/2020   CREATININE 4.56 (H) 02/16/2020   CREATININE 4.69 (H) 02/16/2020  Avoid nephrotoxic agents and renall dose meds. Change Lovenox to heparin .  02/15/20 Labs in am.  02/16/20 Lab Results  Component Value Date   CREATININE 4.33 (H) 02/17/2020   CREATININE 4.56 (H) 02/16/2020   CREATININE 4.69 (H) 02/16/2020   02/17/20 Lab Results  Component Value Date   CREATININE 4.33 (H) 02/17/2020   CREATININE 4.56 (H) 02/16/2020   CREATININE 4.69 (H) 02/16/2020  Aprreciate nephrology management.  Essential hypertension Cont coreg. Cardiac low sodium diet.  Blood pressure 137/69, pulse 65, temperature 97.6 F (36.4 C), temperature source Oral, resp. rate 17, height 5\' 1"  (1.549 m), weight 64.9 kg, SpO2 100 %. 02/15/20 BP is stable, pt is asymptomatic. 02/16/20 hydralazine tid per cardiology for her htn. 02/17/2020 Cont hydralazine and Imdur.   Gout cont allopurinol for hyperuricemia from ckd and gout.   Anemia: Anemia present since 2008 in our charts: Suspect ACD will collect anemia panel . Type/cross/transfues if hb is 8 or below.   Right leg pain: We will do doppler.  DVT prophylaxis: Heparin    Code Status: Full Code  Family Communication: None at bedside. Disposition Plan: TBD   Status is: Inpatient  Remains inpatient appropriate because:Inpatient level of care appropriate due to severity of illness   Dispo: The patient is from: Home  Anticipated d/c is to: Home              Anticipated d/c date is: 2 days              Patient currently is not medically stable to d/c. Consultants:  Cardiology Consult- Dr. Dwyane Dee. Nephrology Consult- Dr. Jonnie Finner.  Procedures:  2 d echocardigram.  Subjective: Pt admitted and is day 1 today she is stable and converses. Pt Denis any complaint otherwise.  Pt was stable able to finish her sentences, she states she is complaint with diet.   02/15/20 Pt is  stable today swelling is resolved. On RA.No complaints of chest pain pressure or sob or otherwise.  Echo shows severely depressed LV function.    02/16/20 Pt looks despondent and is sad about her current situation and does not want to risk her kidney and end up a dialysis pt.   02/17/20 Pt is refusing dialysis and understands we cannot offer her anything and she will decline if nothing is done. Granddaughter was at bedside when we touched on this briefly  on the 6th.  Today she is somnolent due to ativan. She did c/o rt leg pain today which she says she has had for few days but forgot to ell me before.   Objective: Vitals:   02/16/20 1903 02/16/20 2356 02/17/20 0739 02/17/20 1146  BP: (!) 147/69 (!) 146/92 140/77 137/69  Pulse: 71 78 60 65  Resp: 17 16 20 17   Temp: 98 F (36.7 C) (!) 97.5 F (36.4 C) 97.9 F (36.6 C) 97.6 F (36.4 C)  TempSrc: Oral Oral Oral Oral  SpO2: 100% 99% 98% 100%  Weight:      Height:       SpO2: 100 %  Intake/Output Summary (Last 24 hours) at 02/17/2020 1600 Last data filed at 02/17/2020 0800 Gross per 24 hour  Intake 660 ml  Output --  Net 660 ml   Filed Weights   02/13/20 1407  Weight: 64.9 kg    Examination: Blood pressure 137/69, pulse 65, temperature 97.6 F (36.4 C), temperature source Oral, resp. rate 17, height 5\' 1"  (1.549 m), weight 64.9 kg, SpO2 100 %. General exam: Appears calm and comfortable  HEENT:EOMI, perrl  Respiratory system: basilar crackles Cardiovascular system: regular .  No pedal edema . Gastrointestinal system: Abdomen is obese, nondistended, soft and nontender. No organomegaly or masses felt. Normal bowel sounds heard. Central nervous system: Alert and oriented.CN grossly intact No focal neurological deficits. Extremities: pt moving all 4 ext and ambulating. Skin: No rashes, lesions or ulcers Psychiatry: Judgement and insight appear normal. Mood & affect appropriate.   Data Reviewed: I have personally reviewed  following labs and imaging studies  I/O last 3 completed shifts: In: 1020 [P.O.:960; IV Piggyback:60] Out: 1275 [[id number]] Total I/O In: 120 [P.O.:120] Out: -  Lab Results  Component Value Date   CREATININE 4.33 (H) 02/17/2020   CREATININE 4.56 (H) 02/16/2020   CREATININE 4.69 (H) 02/16/2020   CBC: Recent Labs  Lab 02/13/20 1414 02/14/20 0213 02/16/20 0621 02/17/20 1409  WBC 3.4* 3.6* 4.4 3.9*  NEUTROABS  --   --  2.9  --   HGB 11.0* 10.7* 10.6* 12.0  HCT 36.9 34.7* 34.7* 39.4  MCV 103.9* 104.8* 102.4* 105.1*  PLT 121* 126* 105* 712*   Basic Metabolic Panel: Recent Labs  Lab 02/13/20 1414 02/13/20 1920 02/14/20 0213 02/14/20 1708 02/16/20 0621 02/16/20 1658 02/17/20 1052  NA 144  --  145  --  146* 144 144  K 3.5  --  3.4*  --  4.5 4.3 5.0  CL 105  --  109  --  112* 110 110  CO2 21*  --  22  --  23 20* 20*  GLUCOSE 109*  --  207*  --  117* 133* 106*  BUN 103*  --  104*  --  99* 98* 95*  CREATININE 4.98*  --  4.99*  --  4.69* 4.56* 4.33*  CALCIUM 8.5*  --  8.5*  --  8.7* 8.9 9.2  MG  --  2.0  --  2.1 2.0 2.0 2.1  PHOS  --   --   --   --  5.6* 5.5*  --    GFR: Estimated Creatinine Clearance: 8.6 mL/min (A) (by C-G formula based on SCr of 4.33 mg/dL (H)). Liver Function Tests: Recent Labs  Lab 02/16/20 0621  AST 16  ALT 12  ALKPHOS 58  BILITOT 0.6  PROT 5.5*  ALBUMIN 3.1*   No results for input(s): LIPASE, AMYLASE in the last 168 hours. No results for input(s): AMMONIA in the last 168 hours.  Coagulation Profile: No results for input(s): INR, PROTIME in the last 168 hours. Cardiac Enzymes: No results for input(s): CKTOTAL, CKMB, CKMBINDEX, TROPONINI in the last 168 hours. BNP (last 3 results) No results for input(s): PROBNP in the last 8760 hours. HbA1C: No results for input(s): HGBA1C in the last 72 hours. CBG: Recent Labs  Lab 02/13/20 1412  GLUCAP 102*   Lipid Profile: No results for input(s): CHOL, HDL, LDLCALC, TRIG, CHOLHDL,  LDLDIRECT in the last 72 hours. Thyroid Function Tests: Recent Labs    02/14/20 1708  TSH 2.609  FREET4 0.84   Anemia Panel: Recent Labs    02/14/20 1708  VITAMINB12 531  FOLATE 21.7  FERRITIN 193  TIBC 283  IRON 68  RETICCTPCT 1.8   Sepsis Labs: Recent Labs  Lab 02/17/20 1402  LATICACIDVEN 0.9    Recent Results (from the past 240 hour(s))  Resp Panel by RT PCR (RSV, Flu A&B, Covid) - Nasopharyngeal Swab     Status: None   Collection Time: 02/13/20  3:45 PM   Specimen: Nasopharyngeal Swab  Result Value Ref Range Status   SARS Coronavirus 2 by RT PCR NEGATIVE NEGATIVE Final    Comment: (NOTE) SARS-CoV-2 target nucleic acids are NOT DETECTED.  The SARS-CoV-2 RNA is generally detectable in upper respiratoy specimens during the acute phase of infection. The lowest concentration of SARS-CoV-2 viral copies this assay can detect is 131 copies/mL. A negative result does not preclude SARS-Cov-2 infection and should not be used as the sole basis for treatment or other patient management decisions. A negative result may occur with  improper specimen collection/handling, submission of specimen other than nasopharyngeal swab, presence of viral mutation(s) within the areas targeted by this assay, and inadequate number of viral copies (<131 copies/mL). A negative result must be combined with clinical observations, patient history, and epidemiological information. The expected result is Negative.  Fact Sheet for Patients:  PinkCheek.be  Fact Sheet for Healthcare Providers:  GravelBags.it  This test is no t yet approved or cleared by the [place] FDA and  has been authorized for detection and/or diagnosis of SARS-CoV-2 by FDA under an Emergency Use Authorization (EUA). This EUA will remain  in effect (meaning this test can be used) for the duration of the COVID-19 declaration under Section 564(b)(1) of the Act, 21  U.S.C. section 360bbb-3(b)(1), unless the  authorization is terminated or revoked sooner.     Influenza A by PCR NEGATIVE NEGATIVE Final   Influenza B by PCR NEGATIVE NEGATIVE Final    Comment: (NOTE) The Xpert Xpress SARS-CoV-2/FLU/RSV assay is intended as an aid in  the diagnosis of influenza from Nasopharyngeal swab specimens and  should not be used as a sole basis for treatment. Nasal washings and  aspirates are unacceptable for Xpert Xpress SARS-CoV-2/FLU/RSV  testing.  Fact Sheet for Patients: PinkCheek.be  Fact Sheet for Healthcare Providers: GravelBags.it  This test is not yet approved or cleared by the [place] FDA and  has been authorized for detection and/or diagnosis of SARS-CoV-2 by  FDA under an Emergency Use Authorization (EUA). This EUA will remain  in effect (meaning this test can be used) for the duration of the  Covid-19 declaration under Section 564(b)(1) of the Act, 21  U.S.C. section 360bbb-3(b)(1), unless the authorization is  terminated or revoked.    Respiratory Syncytial Virus by PCR NEGATIVE NEGATIVE Final    Comment: (NOTE) Fact Sheet for Patients: PinkCheek.be  Fact Sheet for Healthcare Providers: GravelBags.it  This test is not yet approved or cleared by the [place] FDA and  has been authorized for detection and/or diagnosis of SARS-CoV-2 by  FDA under an Emergency Use Authorization (EUA). This EUA will remain  in effect (meaning this test can be used) for the duration of the  COVID-19 declaration under Section 564(b)(1) of the Act, 21 U.S.C.  section 360bbb-3(b)(1), unless the authorization is terminated or  revoked. Performed at [facility], [place] [address], [place], [place] [address]       Radiology Studies: DG Chest 1 View  Result Date: 02/16/2020 CLINICAL DATA:  Shortness of breath. EXAM: CHEST  1 VIEW  COMPARISON:  02/13/2020 FINDINGS: Cardiac enlargement. Aortic atherosclerotic calcifications. Pulmonary vascular congestion. No pleural effusion or edema no airspace densities. IMPRESSION: Cardiac enlargement and pulmonary vascular congestion. Electronically Signed   By: Kerby Moors M.D.   On: 02/16/2020 07:06   Current Facility-Administered Medications (Cardiovascular):  .  carvedilol (COREG) tablet 6.25 mg  Current Outpatient Medications (Cardiovascular):  .  carvedilol (COREG) 6.25 MG tablet, Take 6.25 mg by mouth 2 (two) times daily with a meal. .  hydrALAZINE (APRESOLINE) 25 MG tablet, Take 25 mg by mouth 2 (two) times daily.  .  nitroGLYCERIN (NITROSTAT) 0.4 MG SL tablet, Place 1 tablet (0.4 mg total) under the tongue every 5 (five) minutes as needed for chest pain. [name]  torsemide (DEMADEX) 20 MG tablet, Take 1-2 tablets (20-40 mg total) by mouth 2 (two) times daily. Take 2 tabs daily at 8 AM and 1 tab daily after lunch at 1 PM. (Patient taking differently: Take 80 mg by mouth daily. )  Current Facility-Administered Medications (Respiratory):  .  albuterol (VENTOLIN HFA) 108 (90 Base) MCG/ACT inhaler 2 puff  Current Outpatient Medications (Respiratory):  .  albuterol (PROVENTIL HFA;VENTOLIN HFA) 108 (90 BASE) MCG/ACT inhaler, Inhale 2 puffs into the lungs every 6 (six) hours as needed for wheezing or shortness of breath.  Current Facility-Administered Medications (Analgesics):  .  acetaminophen (TYLENOL) tablet 650 mg **OR** acetaminophen (TYLENOL) suppository 650 mg  .  allopurinol (ZYLOPRIM) tablet 100 mg .  aspirin EC tablet 81 mg  Current Outpatient Medications (Analgesics):  [name]  Acetaminophen (TYLENOL ARTHRITIS PAIN PO), Take 2 tablets by mouth daily as needed (pain). [name]  allopurinol (ZYLOPRIM) 100 MG tablet, Take 100 mg by mouth 2 (two) times daily.  [name]  aspirin EC 81 MG tablet, Take 81 mg by mouth daily.  Current Facility-Administered Medications (Hematological):  .  enoxaparin  (LOVENOX) injection 30 mg   Current Facility-Administered Medications (Other):  .  latanoprost (XALATAN) 0.005 % ophthalmic solution 1 drop .  pantoprazole (PROTONIX) EC tablet 40 mg .  sodium chloride flush (NS) 0.9 % injection 3 mL  Current Outpatient Medications (Other):  .  esomeprazole (NEXIUM) 40 MG capsule, Take 40 mg by mouth daily at 12 noon. .  latanoprost (XALATAN) 0.005 % ophthalmic solution, Place 1 drop into both eyes at bedtime. .  Vitamin D, Cholecalciferol, 400 UNITS CAPS, Take 2 capsules by mouth daily.  Anti-infectives (From admission, onward)   None      Scheduled Meds: . allopurinol  100 mg Oral BID  . aspirin EC  81 mg Oral Daily  . carvedilol  3.125 mg Oral BID WC  . heparin injection (subcutaneous)  5,000 Units Subcutaneous Q8H  . hydrALAZINE  50 mg Oral Q8H  . isosorbide mononitrate  30 mg Oral Daily  . latanoprost  1 drop Both Eyes QHS  . pantoprazole  40 mg Oral Daily  . sodium chloride flush  3 mL Intravenous Q12H    LOS: 4 days    Para Skeans, MD [facility] Pager [phone] If 7PM-7AM, please contact night-coverage [email or web address] Password [facility] 02/17/2020, 4:00 PM

## 2020-02-17 NOTE — Progress Notes (Signed)
0815 ordered breakfast for pt, she was interactive in conversation but drowsy, stated she felt SOB so I put her on [facility].  Lung sound diminished but clear.  Assisted pt with ordering breakfast and instructed that I would be back.  0915 pt appeared to be sleeping but was very difficult to arouse.  Paged Dr. Posey Pronto to bedside.  Stroke workup resulted in no deficits, VSS.pt difficult to arouse even despite sternal rubbing.  Last dose of ativan was last night around 2200.   Rapid Nurse called to bedside and agreed with no immediate change in patients status.  Instructed from Dr. Posey Pronto to reach out to cardiology for input.

## 2020-02-18 ENCOUNTER — Inpatient Hospital Stay (HOSPITAL_COMMUNITY): Payer: Medicare Other

## 2020-02-18 DIAGNOSIS — M79609 Pain in unspecified limb: Secondary | ICD-10-CM

## 2020-02-18 DIAGNOSIS — Z515 Encounter for palliative care: Secondary | ICD-10-CM

## 2020-02-18 DIAGNOSIS — M7989 Other specified soft tissue disorders: Secondary | ICD-10-CM | POA: Diagnosis not present

## 2020-02-18 DIAGNOSIS — Z66 Do not resuscitate: Secondary | ICD-10-CM

## 2020-02-18 DIAGNOSIS — N186 End stage renal disease: Secondary | ICD-10-CM

## 2020-02-18 LAB — RENAL FUNCTION PANEL
Albumin: 3.3 g/dL — ABNORMAL LOW (ref 3.5–5.0)
Anion gap: 11 (ref 5–15)
BUN: 93 mg/dL — ABNORMAL HIGH (ref 8–23)
CO2: 24 mmol/L (ref 22–32)
Calcium: 9.3 mg/dL (ref 8.9–10.3)
Chloride: 111 mmol/L (ref 98–111)
Creatinine, Ser: 4.49 mg/dL — ABNORMAL HIGH (ref 0.44–1.00)
GFR, Estimated: 9 mL/min — ABNORMAL LOW (ref >60)
Glucose, Bld: 92 mg/dL (ref 70–99)
Phosphorus: 6.9 mg/dL — ABNORMAL HIGH (ref 2.5–4.6)
Potassium: 4.5 mmol/L (ref 3.5–5.1)
Sodium: 146 mmol/L — ABNORMAL HIGH (ref 135–145)

## 2020-02-18 MED ORDER — HALOPERIDOL 0.5 MG PO TABS
0.5000 mg | ORAL_TABLET | ORAL | Status: DC | PRN
  Filled 2020-02-18: qty 1

## 2020-02-18 MED ORDER — GLYCOPYRROLATE 0.2 MG/ML IJ SOLN: 0.2000 mg | INTRAMUSCULAR | Status: DC | PRN

## 2020-02-18 MED ORDER — GLYCOPYRROLATE 1 MG PO TABS
1.0000 mg | ORAL_TABLET | ORAL | Status: DC | PRN
  Filled 2020-02-18: qty 1

## 2020-02-18 MED ORDER — BIOTENE DRY MOUTH MT LIQD: 15.0000 mL | OROMUCOSAL | Status: DC | PRN

## 2020-02-18 MED ORDER — HYDROMORPHONE HCL 1 MG/ML IJ SOLN
0.2500 mg | INTRAMUSCULAR | Status: DC | PRN
  Administered 2020-02-19 – 2020-02-20 (×5): 0.25 mg via INTRAVENOUS
  Filled 2020-02-18 (×5): qty 1

## 2020-02-18 MED ORDER — HALOPERIDOL LACTATE 2 MG/ML PO CONC
0.5000 mg | ORAL | Status: DC | PRN
  Filled 2020-02-18: qty 0.3

## 2020-02-18 MED ORDER — POLYVINYL ALCOHOL 1.4 % OP SOLN
1.0000 [drp] | Freq: Four times a day (QID) | OPHTHALMIC | Status: DC | PRN
  Filled 2020-02-18: qty 15

## 2020-02-18 MED ORDER — ONDANSETRON HCL 4 MG/2ML IJ SOLN: 4.0000 mg | Freq: Four times a day (QID) | INTRAMUSCULAR | Status: DC | PRN

## 2020-02-18 MED ORDER — ONDANSETRON 4 MG PO TBDP: 4.0000 mg | ORAL_TABLET | Freq: Four times a day (QID) | ORAL | Status: DC | PRN

## 2020-02-18 MED ORDER — HYDROMORPHONE HCL 2 MG PO TABS
1.0000 mg | ORAL_TABLET | ORAL | Status: DC | PRN
  Administered 2020-02-18 – 2020-02-19 (×4): 1 mg via ORAL
  Filled 2020-02-18 (×4): qty 1

## 2020-02-18 MED ORDER — METOLAZONE 5 MG PO TABS
5.0000 mg | ORAL_TABLET | Freq: Once | ORAL | Status: AC
  Administered 2020-02-18: 5 mg via ORAL
  Filled 2020-02-18: qty 1

## 2020-02-18 MED ORDER — HALOPERIDOL LACTATE 5 MG/ML IJ SOLN: 0.5000 mg | INTRAMUSCULAR | Status: DC | PRN

## 2020-02-18 MED ORDER — FUROSEMIDE 10 MG/ML IJ SOLN
160.0000 mg | Freq: Three times a day (TID) | INTRAVENOUS | Status: DC
  Administered 2020-02-18: 160 mg via INTRAVENOUS
  Filled 2020-02-18 (×2): qty 16
  Filled 2020-02-18: qty 4

## 2020-02-18 NOTE — Consult Note (Signed)
Consultation Note Date: 02/18/2020   Patient Name: Doris Howell  DOB: 06/04/1935  MRN: [id number]  Age / Sex: 84 y.o., female  PCP: Doris Orn, MD Referring Physician: Para Skeans, MD  Reason for Consultation: Establishing goals of care  HPI/Patient Profile: 84 y.o. female  with past medical history of diastolic heart failure, pulmonary HTN, stage 5 kidney failure, DM, gout, and kidney stones who was admitted on 02/13/2020 with shortness of breath, leg edema and pain.  Nephrology was consulted and recommended hemodialysis.  The patient refused hemodialysis.  PMT was consulted for goals of care and consideration of Hospice.   Clinical Assessment and Goals of Care:  I have reviewed medical records including EPIC notes, labs and imaging, received report from Doris Howell, examined the patient and met at bedside with her niece Doris Howell  to discuss diagnosis prognosis, [place], EOL wishes, disposition and options.  Doris Howell explained that she and her sister Doris Howell are the patient's POA.  They work in coordination with the patient's husband and grand daughter Doris Howell to care for [name].  I introduced Palliative Medicine as specialized medical care for people living with serious illness. It focuses on providing relief from the symptoms and stress of a serious illness.   We discussed a brief life review of the patient. [name] is "a spirit filled woman of God".  She loved doing for others and frequently made social visits to help family members, friends and church family.  She is such an active member of Doris Howell that she has her own parking space.  She is part of a family of singers.  She both sings and plays the piano.  As far as functional and nutritional status, she was up walking around and doing for others until just a couple of weeks ago.  She took wonderful care of her husband Doris Howell.     Doris Howell and I discussed her current illness and what it means in the larger context of her on-going co-morbidities.  Natural disease trajectory and expectations at EOL were discussed.  I attempted to elicit values and goals of care important to the patient.  [name] made a firm decision not to accept dialysis.  She watched two siblings on dialysis and was firm that she did not want it.    On my visit [name] was confused and sleepy.  She complained of right leg pain and was perseverating on the thought of somebody being in her home.  Consequently I was unable to speak with her directly about her goals and wishes.  Her niece Doris Howell and I discussed disposition options.  Doris Howell felt that going home was not acceptable.  We considered Hospice House.     Doris Howell asked me to communicate with Doris Howell and explain his wife's current situation.   I called Doris Howell a couple of hours later.  He was understanding and accepting of his wife's decision not to take hemodialysis.  He also understood that her prognosis was very short as a result.  I talked with him about codes status.  He told me he felt she would trust in the [place] and that we should not go to aggressive measures to resuscitate her if she passed away.  We made a plan to meet in the hospital at 12:00 on 11/10 for a family meeting.  After talking with Doris Howell, I followed up once more with Doris Howell and Doris Howell.  We discussed code status, a shift to comfort measures and a plan for a follow up family meeting in the hospital tomorrow at 12:00.  Doris Howell and Doris Howell confirmed DNR, comfort focus and the family meeting.   Primary Decision Maker:  Doris Howell and Doris Howell    SUMMARY OF RECOMMENDATIONS    Code status changed to DNR Comfort measures only Unrestricted visitation status to be directed by RN. Plan for family meeting on 11/10 will discuss [place].\ Very low dose dilaudid added PRN for severe leg pain,  discomfort, or dyspnea. Orders not focused on comfort were discontinued.  Code Status/Advance Care Planning:  DNR   Symptom Management:   As above  Additional Recommendations (Limitations, Scope, Preferences):  Full Comfort Care  Palliative Prophylaxis:   Frequent Pain Assessment; Delirium precautions   Psycho-social/Spiritual:   Desire for further Chaplaincy support:  Welcomed  Prognosis:  Days to weeks.    Discharge Planning: Hospice facility to be considered on 11/10      Primary Diagnoses: Present on Admission: . Gout . CKD (chronic kidney disease), stage IV ([place]) . Gastroesophageal reflux disease without esophagitis . Essential hypertension   I have reviewed the medical record, interviewed the patient and family, and examined the patient. The following aspects are pertinent.  Past Medical History:  Diagnosis Date  . CAP (community acquired pneumonia) 01/09/2014   "first time I've ever had it"  . Diastolic congestive heart failure ([place])   . GERD (gastroesophageal reflux disease)   . High cholesterol   . History of gout   . Hypertension   . Kidney stones   . Type II diabetes mellitus with stage 4 chronic kidney disease (HCC)    Social History   Socioeconomic History  . Marital status: Married    Spouse name: Not on file  . Number of children: Not on file  . Years of education: Not on file  . Highest education level: Not on file  Occupational History  . Not on file  Tobacco Use  . Smoking status: Never Smoker  . Smokeless tobacco: Never Used  Vaping Use  . Vaping Use: Never used  Substance and Sexual Activity  . Alcohol use: No  . Drug use: No  . Sexual activity: Not Currently  Other Topics Concern  . Not on file  Social History Narrative  . Not on file   Social Determinants of Health   Financial Resource Strain:   . Difficulty of Paying Living Expenses: Not on file  Food Insecurity:   . Worried About Charity fundraiser in the  Last Year: Not on file  . Ran Out of Food in the Last Year: Not on file  Transportation Needs:   . Lack of Transportation (Medical): Not on file  . Lack of Transportation (Non-Medical): Not on file  Physical Activity:   . Days of Exercise per Week: Not on file  . Minutes of Exercise per Session: Not on file  Stress:   . Feeling of Stress : Not on file  Social Connections:   . Frequency of Communication with Friends and  Family: Not on file  . Frequency of Social Gatherings with Friends and Family: Not on file  . Attends Religious Services: Not on file  . Active Member of Clubs or Organizations: Not on file  . Attends Archivist Meetings: Not on file  . Marital Status: Not on file   Family History  Problem Relation Age of Onset  . Hypertension Mother   . Diabetes Brother   . CAD Neg Hx     Allergies  Allergen Reactions  . Other Nausea Only    pholcodeine  . Simvastatin Other (See Comments)    Myalgias- MD took patient off med  . Codeine Other (See Comments)    Unknown; pt can't remember. It was in the '70s      Vital Signs: BP (!) 118/53 (BP Location: Right Arm)   Pulse 62   Temp 97.7 F (36.5 C) (Oral)   Resp 19   Ht '5\' 1"'  (1.549 m)   Wt 73.6 kg   SpO2 99%   BMI 30.66 kg/m  Pain Scale: PAINAD   Pain Score: Asleep   SpO2: SpO2: 99 % O2 Device:SpO2: 99 % O2 Flow Rate: .O2 Flow Rate (L/min): 2 L/min    Palliative Assessment/Data: 20%     Time In: 4:00 Time Out: 5:30 Time Total: 90 min Visit consisted of counseling and education dealing with the complex and emotionally intense issues surrounding the need for palliative care and symptom management in the setting of serious and potentially life-threatening illness. Greater than 50%  of this time was spent counseling and coordinating care related to the above assessment and plan.  Signed by: Florentina Jenny, PA-C Palliative Medicine  Please contact Palliative Medicine Team phone at [phone]  for questions and concerns.  For individual provider: See Shea Evans

## 2020-02-18 NOTE — Care Management Important Message (Signed)
Important Message  Patient Details  Name: Doris Howell MRN: [id number] Date of Birth: [birth year]   Medicare Important Message Given:  Yes     Shelda Altes 02/18/2020, 11:01 AM

## 2020-02-18 NOTE — Progress Notes (Signed)
PROGRESS NOTE    Doris Howell  FTD:[id number] DOB: [birth year] DOA: 02/13/2020  PCP: Lavone Orn, MD    Brief Narrative:  Pt is a full code 84 y./o women admitted on 02/13/2020 for SOB and LE edema. Pt states she does not and her heart  md dr told her heart heart is. Good/ I d/w that it is critical that she stay off sodium. She has a  past medical history of CHF/CKD/[place] htn and was admitted for her chf.  Assessment & Plan: Acute on chronic  exacerbation of systolic CHF (congestive heart failure) ([place]): Pt has echo that showed about 40 # EF and we will repeat echo dn also cardiology has been consulted.Stric I/t.Monitor electrollytes and replace.Cont home regimen of meds as follows: asa/statin/ ace or arb once euvolemic.  Intake/Output Summary (Last 24 hours) at 02/18/2020 1650 Last data filed at 02/18/2020 1517 Gross per 24 hour  Intake 216 ml  Output 500 ml  Net -284 ml   Have changed lasix to 60 mg q 12h for today and we will reassess and redose in am.  I suspect pt will need at lead  another 24-48 hours of hospital monitoring for her A/C on C/H systolic CHF with reduced EF and echo is pending.  SpO2: 100 % O2 Flow Rate (L/min): 2 L/min on RA and heart rate has been bradycardic, and cardiology has been consulted.  02/15/20 Pt seen with cardiology at bedside pt is declining cath because of renal complication risk.   02/16/20 Pt seen today by nephrology and cardiology and we greatly appreciate specialist management and care. Pt is started on renal dose diuretic therapy for her volume overload.   02/17/20 Pt seen today she was very sleep nurse thought she was having a stroke but pt woke and answered appropriately and no neuro deficit .renal dose lasix.   02/18/20 Pt seen today she was and is little more confused but nodds her head no dialysis. palliative care is on board.  Bradycardia: Monitoring on telemetry . TFT's. Electrolyte checks.  02/15/20 Cont coreg at  same dose pt is asymptomatic. 02/16/20 Pt coreg held  02/17/20 Coreg restarted at 3.125 mg bid.   CKD (chronic kidney disease), stage IV (HCC) Lab Results  Component Value Date   CREATININE 4.49 (H) 02/18/2020   CREATININE 4.33 (H) 02/17/2020   CREATININE 4.56 (H) 02/16/2020  Avoid nephrotoxic agents and renall dose meds. Change Lovenox to heparin .  02/15/20 Labs in am.  02/16/20 Lab Results  Component Value Date   CREATININE 4.49 (H) 02/18/2020   CREATININE 4.33 (H) 02/17/2020   CREATININE 4.56 (H) 02/16/2020   02/17/20 Lab Results  Component Value Date   CREATININE 4.49 (H) 02/18/2020   CREATININE 4.33 (H) 02/17/2020   CREATININE 4.56 (H) 02/16/2020  Aprreciate nephrology management. Pt declined dialysis.  Essential hypertension Cont coreg. Cardiac low sodium diet.  Blood pressure 125/62, pulse 69, temperature 97.7 F (36.5 C), temperature source Oral, resp. rate 19, height 5\' 1"  (1.549 m), weight 73.6 kg, SpO2 100 %. 02/15/20 BP is stable, pt is asymptomatic. 02/16/20 hydralazine tid per cardiology for her htn. 02/17/2020 Cont hydralazine and Imdur.  02/18/20 Cont hydralazine, imdur,coreg and lasix.   Gout cont allopurinol for hyperuricemia from ckd and gout.   Anemia: Anemia present since 2008 in our charts: Suspect ACD will collect anemia panel . Type/cross/transfues if hb is 8 or below.   Right leg pain: We will do doppler. Doppler negative, suspect gout related we will continue  allopurinol.  DVT prophylaxis: Heparin    Code Status: Full Code  Family Communication: None at bedside. Disposition Plan: TBD   Status is: Inpatient  Remains inpatient appropriate because:Inpatient level of care appropriate due to severity of illness   Dispo: The patient is from: Home              Anticipated d/c is to: Home              Anticipated d/c date is: 2 days              Patient currently is not medically stable to d/c. Consultants:  Cardiology Consult- Dr.  Dwyane Dee. Nephrology Consult- Dr. Jonnie Finner.  Procedures:  2 d echocardigram.  Subjective: Pt admitted and is day 1 today she is stable and converses. Pt Denis any complaint otherwise.  Pt was stable able to finish her sentences, she states she is complaint with diet.   02/15/20 Pt is stable today swelling is resolved. On RA.No complaints of chest pain pressure or sob or otherwise.  Echo shows severely depressed LV function.    02/16/20 Pt looks despondent and is sad about her current situation and does not want to risk her kidney and end up a dialysis pt.   02/17/20 Pt is refusing dialysis and understands we cannot offer her anything and she will decline if nothing is done. Granddaughter was at bedside when we touched on this briefly  on the 6th.  Today she is somnolent due to ativan. She did c/o rt leg pain today which she says she has had for few days but forgot to ell me before.   02/18/20 Pt seen today is more somnolent her bun is rising and spoke to niece algie jones on phone and she says her uncle and patricia and family are to decide plan.patricia has been her medical power of attorney but they will need to get together and make plan.  Objective: Vitals:   02/18/20 0458 02/18/20 0500 02/18/20 0900 02/18/20 1138  BP: 128/63  135/66 125/62  Pulse: 68 70 69 69  Resp: 20  19 19   Temp: 98 F (36.7 C)  98 F (36.7 C) 97.7 F (36.5 C)  TempSrc: Oral  Oral Oral  SpO2: 100%  100% 100%  Weight:  73.6 kg    Height:       SpO2: 100 % O2 Flow Rate (L/min): 2 L/min  Intake/Output Summary (Last 24 hours) at 02/18/2020 1650 Last data filed at 02/18/2020 1517 Gross per 24 hour  Intake 216 ml  Output 500 ml  Net -284 ml   Filed Weights   02/13/20 1407 02/18/20 0500  Weight: 64.9 kg 73.6 kg    Examination: Blood pressure 125/62, pulse 69, temperature 97.7 F (36.5 C), temperature source Oral, resp. rate 19, height 5\' 1"  (1.549 m), weight 73.6 kg, SpO2 100 %. General  exam: Appears calm and comfortable  HEENT:EOMI, perrl  Respiratory system: basilar crackles Cardiovascular system: regular .  No pedal edema . Gastrointestinal system: Abdomen is obese, nondistended, soft and nontender. No organomegaly or masses felt. Normal bowel sounds heard. Central nervous system: Alert and oriented.CN grossly intact No focal neurological deficits. Extremities: pt moving all 4 ext . No edema Skin: No rashes, lesions or ulcers Psychiatry: Judgement and insight appear normal. Mood & affect appropriate.   Data Reviewed: I have personally reviewed following labs and imaging studies  I/O last 3 completed shifts: In: 27 [P.O.:850; IV Piggyback:120] Out: 500 [Urine:500] Total I/O In:  66 [IV Piggyback:66] Out: -  Lab Results  Component Value Date   CREATININE 4.49 (H) 02/18/2020   CREATININE 4.33 (H) 02/17/2020   CREATININE 4.56 (H) 02/16/2020   CBC: Recent Labs  Lab 02/13/20 1414 02/14/20 0213 02/16/20 0621 02/17/20 1409  WBC 3.4* 3.6* 4.4 3.9*  NEUTROABS  --   --  2.9  --   HGB 11.0* 10.7* 10.6* 12.0  HCT 36.9 34.7* 34.7* 39.4  MCV 103.9* 104.8* 102.4* 105.1*  PLT 121* 126* 105* 527*   Basic Metabolic Panel: Recent Labs  Lab 02/13/20 1920 02/14/20 0213 02/14/20 0213 02/14/20 1708 02/16/20 0621 02/16/20 1658 02/17/20 1052 02/17/20 2021 02/18/20 0733  NA  --  145  --   --  146* 144 144  --  146*  K  --  3.4*   < >  --  4.5 4.3 5.0 4.5 4.5  CL  --  109  --   --  112* 110 110  --  111  CO2  --  22  --   --  23 20* 20*  --  24  GLUCOSE  --  207*  --   --  117* 133* 106*  --  92  BUN  --  104*  --   --  99* 98* 95*  --  93*  CREATININE  --  4.99*  --   --  4.69* 4.56* 4.33*  --  4.49*  CALCIUM  --  8.5*  --   --  8.7* 8.9 9.2  --  9.3  MG   < >  --   --  2.1 2.0 2.0 2.1 2.2  --   PHOS  --   --   --   --  5.6* 5.5*  --   --  6.9*   < > = values in this interval not displayed.   GFR: Estimated Creatinine Clearance: 8.9 mL/min (A) (by C-G formula  based on SCr of 4.49 mg/dL (H)). Liver Function Tests: Recent Labs  Lab 02/16/20 0621 02/18/20 0733  AST 16  --   ALT 12  --   ALKPHOS 58  --   BILITOT 0.6  --   PROT 5.5*  --   ALBUMIN 3.1* 3.3*   No results for input(s): LIPASE, AMYLASE in the last 168 hours. No results for input(s): AMMONIA in the last 168 hours.  Coagulation Profile: No results for input(s): INR, PROTIME in the last 168 hours. Cardiac Enzymes: No results for input(s): CKTOTAL, CKMB, CKMBINDEX, TROPONINI in the last 168 hours. BNP (last 3 results) No results for input(s): PROBNP in the last 8760 hours. HbA1C: No results for input(s): HGBA1C in the last 72 hours. CBG: Recent Labs  Lab 02/13/20 1412  GLUCAP 102*   Lipid Profile: No results for input(s): CHOL, HDL, LDLCALC, TRIG, CHOLHDL, LDLDIRECT in the last 72 hours. Thyroid Function Tests: No results for input(s): TSH, T4TOTAL, FREET4, T3FREE, THYROIDAB in the last 72 hours. Anemia Panel: No results for input(s): VITAMINB12, FOLATE, FERRITIN, TIBC, IRON, RETICCTPCT in the last 72 hours. Sepsis Labs: Recent Labs  Lab 02/17/20 1402  LATICACIDVEN 0.9    Recent Results (from the past 240 hour(s))  Resp Panel by RT PCR (RSV, Flu A&B, Covid) - Nasopharyngeal Swab     Status: None   Collection Time: 02/13/20  3:45 PM   Specimen: Nasopharyngeal Swab  Result Value Ref Range Status   SARS Coronavirus 2 by RT PCR NEGATIVE NEGATIVE Final    Comment: (NOTE) SARS-CoV-2  target nucleic acids are NOT DETECTED.  The SARS-CoV-2 RNA is generally detectable in upper respiratoy specimens during the acute phase of infection. The lowest concentration of SARS-CoV-2 viral copies this assay can detect is 131 copies/mL. A negative result does not preclude SARS-Cov-2 infection and should not be used as the sole basis for treatment or other patient management decisions. A negative result may occur with  improper specimen collection/handling, submission of specimen  other than nasopharyngeal swab, presence of viral mutation(s) within the areas targeted by this assay, and inadequate number of viral copies (<131 copies/mL). A negative result must be combined with clinical observations, patient history, and epidemiological information. The expected result is Negative.  Fact Sheet for Patients:  PinkCheek.be  Fact Sheet for Healthcare Providers:  GravelBags.it  This test is no t yet approved or cleared by the [place] FDA and  has been authorized for detection and/or diagnosis of SARS-CoV-2 by FDA under an Emergency Use Authorization (EUA). This EUA will remain  in effect (meaning this test can be used) for the duration of the COVID-19 declaration under Section 564(b)(1) of the Act, 21 U.S.C. section 360bbb-3(b)(1), unless the authorization is terminated or revoked sooner.     Influenza A by PCR NEGATIVE NEGATIVE Final   Influenza B by PCR NEGATIVE NEGATIVE Final    Comment: (NOTE) The Xpert Xpress SARS-CoV-2/FLU/RSV assay is intended as an aid in  the diagnosis of influenza from Nasopharyngeal swab specimens and  should not be used as a sole basis for treatment. Nasal washings and  aspirates are unacceptable for Xpert Xpress SARS-CoV-2/FLU/RSV  testing.  Fact Sheet for Patients: PinkCheek.be  Fact Sheet for Healthcare Providers: GravelBags.it  This test is not yet approved or cleared by the [place] FDA and  has been authorized for detection and/or diagnosis of SARS-CoV-2 by  FDA under an Emergency Use Authorization (EUA). This EUA will remain  in effect (meaning this test can be used) for the duration of the  Covid-19 declaration under Section 564(b)(1) of the Act, 21  U.S.C. section 360bbb-3(b)(1), unless the authorization is  terminated or revoked.    Respiratory Syncytial Virus by PCR NEGATIVE NEGATIVE Final     Comment: (NOTE) Fact Sheet for Patients: PinkCheek.be  Fact Sheet for Healthcare Providers: GravelBags.it  This test is not yet approved or cleared by the [place] FDA and  has been authorized for detection and/or diagnosis of SARS-CoV-2 by  FDA under an Emergency Use Authorization (EUA). This EUA will remain  in effect (meaning this test can be used) for the duration of the  COVID-19 declaration under Section 564(b)(1) of the Act, 21 U.S.C.  section 360bbb-3(b)(1), unless the authorization is terminated or  revoked. Performed at [facility], [place] [address], [place], [place] [address]       Radiology Studies: VAS [place] LOWER EXTREMITY VENOUS (DVT)  Result Date: 02/18/2020  Lower Venous DVT Study Indications: Swelling, and Pain.  Limitations: Poor ultrasound/tissue interface and poor patient cooperation. Comparison Study: No prior study Performing Technologist: Maudry Mayhew MHA, RDMS, RVT, RDCS  Examination Guidelines: A complete evaluation includes B-mode imaging, spectral Doppler, color Doppler, and power Doppler as needed of all accessible portions of each vessel. Bilateral testing is considered an integral part of a complete examination. Limited examinations for reoccurring indications may be performed as noted. The reflux portion of the exam is performed with the patient in reverse Trendelenburg.  +---------+---------------+---------+-----------+----------+--------------+ RIGHT    CompressibilityPhasicitySpontaneityPropertiesThrombus Aging +---------+---------------+---------+-----------+----------+--------------+ CFV  Full           Yes      Yes                                 +---------+---------------+---------+-----------+----------+--------------+ SFJ      Full                                                         +---------+---------------+---------+-----------+----------+--------------+ FV Prox  Full                                                        +---------+---------------+---------+-----------+----------+--------------+ FV Mid                           Yes        patent                   +---------+---------------+---------+-----------+----------+--------------+ FV Distal                        Yes        patent                   +---------+---------------+---------+-----------+----------+--------------+ PFV      Full                                                        +---------+---------------+---------+-----------+----------+--------------+ POP      Full           Yes      Yes                                 +---------+---------------+---------+-----------+----------+--------------+ PTV      Full                                                        +---------+---------------+---------+-----------+----------+--------------+ PERO     Full                                                        +---------+---------------+---------+-----------+----------+--------------+   Left Technical Findings: Not visualized segments include CFV.   Summary: RIGHT: - There is no evidence of deep vein thrombosis in the lower extremity. However, portions of this examination were limited- see technologist comments above.  - No cystic structure found in the popliteal fossa.   *See table(s) above for measurements and observations. Electronically signed by Curt Jews MD on 02/18/2020 at 2:43:47 PM.  Final    Current Facility-Administered Medications (Cardiovascular):  .  carvedilol (COREG) tablet 6.25 mg  Current Outpatient Medications (Cardiovascular):  .  carvedilol (COREG) 6.25 MG tablet, Take 6.25 mg by mouth 2 (two) times daily with a meal. .  hydrALAZINE (APRESOLINE) 25 MG tablet, Take 25 mg by mouth 2 (two) times daily.  .  nitroGLYCERIN (NITROSTAT) 0.4 MG SL tablet,  Place 1 tablet (0.4 mg total) under the tongue every 5 (five) minutes as needed for chest pain. [name]  torsemide (DEMADEX) 20 MG tablet, Take 1-2 tablets (20-40 mg total) by mouth 2 (two) times daily. Take 2 tabs daily at 8 AM and 1 tab daily after lunch at 1 PM. (Patient taking differently: Take 80 mg by mouth daily. )  Current Facility-Administered Medications (Respiratory):  .  albuterol (VENTOLIN HFA) 108 (90 Base) MCG/ACT inhaler 2 puff  Current Outpatient Medications (Respiratory):  .  albuterol (PROVENTIL HFA;VENTOLIN HFA) 108 (90 BASE) MCG/ACT inhaler, Inhale 2 puffs into the lungs every 6 (six) hours as needed for wheezing or shortness of breath.  Current Facility-Administered Medications (Analgesics):  .  acetaminophen (TYLENOL) tablet 650 mg **OR** acetaminophen (TYLENOL) suppository 650 mg  .  allopurinol (ZYLOPRIM) tablet 100 mg .  aspirin EC tablet 81 mg  Current Outpatient Medications (Analgesics):  [name]  Acetaminophen (TYLENOL ARTHRITIS PAIN PO), Take 2 tablets by mouth daily as needed (pain). [name]  allopurinol (ZYLOPRIM) 100 MG tablet, Take 100 mg by mouth 2 (two) times daily.  [name]  aspirin EC 81 MG tablet, Take 81 mg by mouth daily.  Current Facility-Administered Medications (Hematological):  .  enoxaparin (LOVENOX) injection 30 mg   Current Facility-Administered Medications (Other):  .  latanoprost (XALATAN) 0.005 % ophthalmic solution 1 drop .  pantoprazole (PROTONIX) EC tablet 40 mg .  sodium chloride flush (NS) 0.9 % injection 3 mL  Current Outpatient Medications (Other):  .  esomeprazole (NEXIUM) 40 MG capsule, Take 40 mg by mouth daily at 12 noon. .  latanoprost (XALATAN) 0.005 % ophthalmic solution, Place 1 drop into both eyes at bedtime. .  Vitamin D, Cholecalciferol, 400 UNITS CAPS, Take 2 capsules by mouth daily.  Anti-infectives (From admission, onward)   None      Scheduled Meds: . allopurinol  100 mg Oral BID  . aspirin EC  81 mg Oral Daily  . carvedilol   3.125 mg Oral BID WC  . Chlorhexidine Gluconate Cloth  6 each Topical Daily  . heparin injection (subcutaneous)  5,000 Units Subcutaneous Q8H  . hydrALAZINE  50 mg Oral Q8H  . isosorbide mononitrate  30 mg Oral Daily  . latanoprost  1 drop Both Eyes QHS  . pantoprazole  40 mg Oral Daily  . sodium chloride flush  3 mL Intravenous Q12H    LOS: 5 days    Para Skeans, MD [facility] Pager [phone] If 7PM-7AM, please contact night-coverage [email or web address] Password [facility] 02/18/2020, 4:50 PM

## 2020-02-18 NOTE — Progress Notes (Signed)
Admit: 02/13/2020 LOS: 5  The patient is a 84 y.o. year-old w/ hx of chron syst CHF EF 35%, CKD 5, h/o L nephrectomy, LBBB, obesity, OSA on CPAP, HTN, HL admitted 11/4 for SOB.  ECHO showed mod TR and MR w/ worsening EF 20-30%. [facility] offered 11/5 by cards but pt declined due to need for IV contrast and pt's desire not to do dialysis.  Subjective:  . No acute events overnight.  She is more alert this morning but confused. Thinks she is home and does not know the year.  Asking where her husband is.  She doesntt know why she is here. Denies any chest pain or worsening SOB, no abdominal pain.  Nursing staff unsure if this is baseline.  No sedative medications given last night.    11/08 0701 - 11/09 0700 In: 330 [P.O.:270; IV Piggyback:60] Out: 500 [Urine:500]  Filed Weights   02/13/20 1407 02/18/20 0500  Weight: 64.9 kg 73.6 kg    Scheduled Meds: . allopurinol  100 mg Oral BID  . aspirin EC  81 mg Oral Daily  . carvedilol  3.125 mg Oral BID WC  . Chlorhexidine Gluconate Cloth  6 each Topical Daily  . heparin injection (subcutaneous)  5,000 Units Subcutaneous Q8H  . hydrALAZINE  50 mg Oral Q8H  . isosorbide mononitrate  30 mg Oral Daily  . latanoprost  1 drop Both Eyes QHS  . pantoprazole  40 mg Oral Daily  . sodium chloride flush  3 mL Intravenous Q12H   Continuous Infusions: . furosemide 100 mg (02/18/20 0304)   PRN Meds Current Labs: reviewed BUN 93, Cr 4.49, Phos 6.9, Na 146   Physical Exam:  Blood pressure 128/63, pulse 70, temperature 98 F (36.7 C), temperature source Oral, resp. rate 20, height 5\' 1"  (1.549 m), weight 73.6 kg, SpO2 100 %. GEN: drowsy but easy to arouse, in no acute distress ENT: no nasal discharge, mmm EYES: no scleral icterus, eomi CV: controlled rate, irregular rhythm,no murmurs PULM: tachypnea, RR 20, on Oxygen 2L, diminished breath sounds, crackles at bases  ABD: NABS, non-distended SKIN: no rashes or jaundice EXT: 2+ lower extremity edema, warm and  well perfused   A/P  Acute on CKD stage V / solitary kidney/ hx L nephrectomy for staghorn calculus  Slight increase in creatinine today. Continues to be volume overloaded with SOB and lower extremity edema.  Lung sounds diminished on exam.  Likely AKI secondary to decompensated CHF.   24hr urine output 500cc. Weight up 8.7 kg since admission, unclear accuracy given only 2 documented weights. -Continue diuresis Lasix 100 mg q 12h -Monitor electrolytes and replete as needed -Daily BMP and Mag -Strict intake and output -Daily weight -Palliative care consulted to discuss goals of care.  Hypertension Normotensive. Continue current management  CHF Recent ECHO shows LVEF 20-30% with sever hypokinesis.  Sever dilation of both atria.  Moderate MR and  Mild -Mod TR  Anemia in CKD Hbg stable Continue to monitor  DM type 2 Last A1c 5.9.  No documented medications Continue to monitor CBG with am labs  Carollee Leitz, MD [facility]  02/18/2020, 7:42 AM  Recent Labs  Lab 02/16/20 0621 02/16/20 0621 02/16/20 1658 02/17/20 1052 02/17/20 2021  NA 146*  --  144 144  --   K 4.5   < > 4.3 5.0 4.5  CL 112*  --  110 110  --   CO2 23  --  20* 20*  --   GLUCOSE  117*  --  133* 106*  --   BUN 99*  --  98* 95*  --   CREATININE 4.69*  --  4.56* 4.33*  --   CALCIUM 8.7*  --  8.9 9.2  --   PHOS 5.6*  --  5.5*  --   --    < > = values in this interval not displayed.   Recent Labs  Lab 02/14/20 0213 02/16/20 0621 02/17/20 1409  WBC 3.6* 4.4 3.9*  NEUTROABS  --  2.9  --   HGB 10.7* 10.6* 12.0  HCT 34.7* 34.7* 39.4  MCV 104.8* 102.4* 105.1*  PLT 126* 105* 105*    Plan communicated to the primary team

## 2020-02-18 NOTE — Progress Notes (Signed)
Right lower extremity venous duplex completed. Refer to "CV Proc" under chart review to view preliminary results.  02/18/2020 2:19 PM Kelby Aline., MHA, RVT, RDCS, RDMS

## 2020-02-19 NOTE — Progress Notes (Signed)
Engineer, maintenance [facility]) Hospital Liaison note.     Received request from [place] for family interest in [facility]. Patient information has been forwarded to [facility] for review. Spoke with niece Mardene Celeste to answer questions and offer support. [place] liaison will follow up once eligibility and availability have been determined.   A Please do not hesitate to call with questions.     Thank you,    Farrel Gordon, RN, [facility]       [place] (listed on AMION under [place])     [phone]

## 2020-02-19 NOTE — Progress Notes (Signed)
Engineer, maintenance [facility]) Hospital Liaison note.  Received request from [place], RN for family interest in [facility]. Chart reviewed and eligibility confirmed. Spoke with family to confirm interest and explain services.  Patient can transfer to [facility] Thursday morning 02/20/20 pending completion of consents.  [facility] will notify TOC when registration paperwork has been completed.    RN please call report to [phone] prior to transfer.  Thank you,     Farrel Gordon, RN, CCM       [place] (listed on [place] under [facility])     [phone]

## 2020-02-19 NOTE — Progress Notes (Signed)
Nephrology brief note: Palliative care consult note reviewed.  Patient is now comfort care and plan for hospice on discharge. Nothing further to add, please call back with question. Please see nephrology progress note from 11/9 for details.  Katheran James, MD [facility].

## 2020-02-19 NOTE — Progress Notes (Signed)
Daily Progress Note   Patient Name: Doris Howell       Date: 02/19/2020 DOB: 1935/12/28  Age: 84 y.o. MRN#: [id number] Attending Physician: Oswald Hillock, MD Primary Care Physician: Lavone Orn, MD Admit Date: 02/13/2020  Reason for Consultation/Follow-up:  To discuss complex medical decision making related to patient's goals of care   Emotional meeting with family (husband, nieces, grand daughter and two grandsons.  Her grand son-in-law works [place] for [employer] and joined [employer] after it started.  Subjective: We discussed her current status (Heart very weak, solo kidney has failed, she made a firm decision for no hemodialysis some time ago).  Family understands she is, very unfortunately, dying.   We discussed prognosis, symptom management and options for disposition.   After some discussion family agreed upon [facility].    Husband [name] agreed to complete paper work for [employer] and he wishes to be notified before she is transferred.   HCPOA Mardene Celeste requested that she be kept informed as well as [name] sometimes forgets and an extra set of ears helps.   Assessment: Patient in mild discomfort.  Responds by opening her eyes and acknowledging family, but she does not have the strength to acknowledge me.  Not eating.  Requires low dose opioid medication for refractory right leg pain.     Patient Profile/HPI:  84 y.o. female  with past medical history of diastolic heart failure, pulmonary HTN, stage 5 kidney failure, DM, gout, and kidney stones who was admitted on 02/13/2020 with shortness of breath, leg edema and pain.  Nephrology was consulted and recommended hemodialysis.  The patient refused hemodialysis.  PMT was consulted for goals of care and  consideration of Hospice.      Length of Stay: 6   Vital Signs: BP (!) 138/53 (BP Location: Right Arm)   Pulse 67   Temp 98.2 F (36.8 C) (Oral)   Resp 16   Ht 5\' 1"  (1.549 m)   Wt 73.6 kg   SpO2 93%   BMI 30.66 kg/m  SpO2: SpO2: 93 % O2 Device: O2 Device: Nasal Cannula O2 Flow Rate: O2 Flow Rate (L/min): 1 L/min       Palliative Assessment/Data: 20%     Palliative Care Plan    Recommendations/Plan:  Comfort measures.  [employer]  requested by family.  Large [place] family = they are aware only 3 at beside at a time.  Very spiritual loving woman of God.  Doristine Bosworth will come visit.  Code Status:  DNR  Prognosis:   Most likely days.  Renal failure, advanced HF, no longer eating.  Discharge Planning:  Hospice facility  Care plan was discussed with family, RN, communicated with [facility], [facility] Physician, Hospice.  Thank you for allowing the Palliative Medicine Team to assist in the care of this patient.  Total time spent:  60 min.     Greater than 50%  of this time was spent counseling and coordinating care related to the above assessment and plan.  Florentina Jenny, PA-C Palliative Medicine  Please contact Palliative MedicineTeam phone at [phone] for questions and concerns between 7 am - 7 pm.   Please see AMION for individual provider pager numbers.

## 2020-02-19 NOTE — TOC Initial Note (Signed)
Transition of Care (TOC) - Initial/Assessment Note  Marvetta Gibbons RN, BSN Transitions of Care Unit 4E- RN Case Manager See Treatment Team for direct phone #    Patient Details  Name: Doris Howell MRN: [id number] Date of Birth: 18-Jun-1935  Transition of Care [facility] CM/SW Contact:    Dawayne [name], RN Phone Number: 02/19/2020, 2:19 PM  Clinical Narrative:                 Pt admitted from home with spouse with CHF, PC consulted for [place]- noted pt does not want HD. Per PC pt has elected for comfort measures and family is in agreement. Referral received for [facility] placement per family choice as per [facility] meeting.  Call made to Audrea Muscat with [facility] for [facility] referral- per Audrea Muscat there are no [facility] beds today will f/u tomorrow. Audrea Muscat will plan to reach out to family today.  Family request that both husband and niece be contacted regarding Hospice placement and transition to [place]- [phone] Niece- Roselind Messier- [phone]   Expected Discharge Plan: [place] Barriers to Discharge: Hospice Bed not available   Patient Goals and CMS Choice Patient states their goals for this hospitalization and ongoing recovery are:: Comfort care   Choice offered to / list presented to : Adult Children, [place] / Guardian  Expected Discharge Plan and Services Expected Discharge Plan: [place] In-house Referral: Clinical Social Work Discharge Planning Services: CM Consult Post Acute Care Choice: Hospice (per PC team) Living arrangements for the past 2 months: [place]: Hospice and [facility] ([place] referral) Date [place]: 02/19/20 Time [place]: [place] Representative spoke with at [place]: Audrea Muscat  Prior Living Arrangements/Services Living arrangements for the past 2 months: [place] with:: Spouse Patient language and need for interpreter reviewed:: Yes Do you feel safe going back to the place where you live?: No   Patient to have [place] at [place]  Need for Family Participation in Patient Care: Yes (Comment) Care giver support system in place?: Yes (comment)   Criminal Activity/Legal Involvement Pertinent to Current Situation/Hospitalization: No - Comment as needed  Activities of Daily Living      Permission Sought/Granted Permission sought to share information with : Facility Art therapist granted to share information with : Yes, Verbal Permission Granted     Permission granted to share info w AGENCY: [facility]        Emotional Assessment Appearance:: Appears stated age Attitude/Demeanor/Rapport: Unable to Assess Affect (typically observed): Unable to Assess Orientation: : Oriented to Self Alcohol / Substance Use: Not Applicable Psych Involvement: No (comment)  Admission diagnosis:  Acute exacerbation of CHF (congestive heart failure) (HCC) [I50.9] Acute on chronic congestive heart failure, unspecified heart failure type [facility]) [I50.9] Patient Active Problem List   Diagnosis Date Noted  . DNR (do not resuscitate)   . Comfort measures only status   . Palliative care encounter   . Influenza A 06/19/2018  . Acute pulmonary edema ([place]) 06/18/2018  . Fracture of phalanx of finger 02/15/2018  . Pain in right hand 01/04/2018  . Gastroesophageal reflux disease without esophagitis 06/14/2017  . Globus pharyngeus 06/14/2017  . Hoarseness 06/14/2017  .  Pulmonary hypertension ([place]) 10/13/2016  . Left bundle branch block 12/22/2014  . Renal failure, acute on chronic (HCC)   . Pleural effusion on right   . Lung infiltrate   . Acute respiratory failure, unspecified whether with hypoxia or hypercapnia ([place])   . CHF exacerbation ([place])   . Acute on chronic renal failure ([place])   . Acute exacerbation of CHF  (congestive heart failure) ([place]) 08/28/2014  . Acute diastolic CHF (congestive heart failure) ([place]) 08/28/2014  . Chronic kidney disease (CKD), stage IV (severe) ([place])   . Acute on chronic combined systolic and diastolic CHF (congestive heart failure) ([place])   . Sleep apnea 07/29/2014  . Obesity (BMI 37) 04/14/2014  . Bradycardia- beta blocker decreased 04/14/2014  . Pulmonary infiltrates   . Diastolic CHF, acute on chronic (HCC) 04/11/2014  . Type 2 diabetes mellitus with stage 4 chronic kidney disease ([place]) 04/11/2014  . Essential hypertension 04/11/2014  . Gout 04/11/2014  . HCAP (healthcare-associated pneumonia)   . Hyperkalemia 02/08/2014  . Chronic diastolic heart failure ([place]) 02/05/2014  . CKD (chronic kidney disease), stage IV ([place]) 02/05/2014  . CAP (community acquired pneumonia) 01/09/2014  . Acute respiratory failure with hypoxia ([place]) 01/09/2014  . Anemia 01/09/2014  . AKI (acute kidney injury) ([place]) 01/09/2014   PCP:  Lavone Orn, MD Pharmacy:   [id number] - [place], [place] [address] Phone: [phone] Fax: [phone]     Social Determinants of Health (SDOH) Interventions    Readmission Risk Interventions No flowsheet data found.

## 2020-02-19 NOTE — Progress Notes (Signed)
[facility]  PROGRESS NOTE  Doris Howell VOZ:[id number] DOB: September 16, 1935 DOA: 02/13/2020 PCP: Lavone Orn, MD   Brief HPI:   84 year old woman admitted on 02/13/2020 for shortness of breath and lower extremity edema.  She has past medical history of CHF, CKD, pulmonary artery hypertension.    Subjective   Patient seen and examined, very lethargic.  Barely opens eyes.   Assessment/Plan:     1. Acute on chronic systolic CHF exacerbation-patient was started on Lasix 60 mg every 12 hours, cardiology was consulted.  Patient refused dialysis.  Left heart catheterization was deferred as what it would have affected the kidneys and definitely would have required renal replacement therapy.  Patient has been refusing hemodialysis.  Lasix is currently discontinued, patient was seen by palliative care and is full comfort care only. 2. CKD stage V-patient has solitary kidney, history of left nephrectomy for staghorn calculi.  Worsening renal failure due to decompensated heart failure.  Patient refused hemodialysis.  She was started on diuresis with Lasix and metolazone.  Currently she is off diuretics as patient is comfort measures only. 3. Metabolic encephalopathy-likely from sedatives, no patient is on Dilaudid as needed for pain and shortness of breath.  Continue comfort measures. 4. Hypertension-continue Coreg 3.125 mg p.o. twice daily 5. Gout continue allopurinol 6. Right leg pain-Doppler was negative for DVT likely from gout continue allopurinol. 7. Goals of care-palliative care was consulted and at this time family has chosen comfort measures only.  No hemodialysis.  Plan to transfer to residential hospice on 02/20/2020.     COVID-19 Labs  No results for input(s): DDIMER, FERRITIN, LDH, CRP in the last 72 hours.  Lab Results  Component Value Date   [place] NEGATIVE 02/13/2020     Scheduled medications:   . allopurinol  100 mg Oral BID  . carvedilol  3.125 mg Oral  BID WC  . Chlorhexidine Gluconate Cloth  6 each Topical Daily  . hydrALAZINE  50 mg Oral Q8H  . isosorbide mononitrate  30 mg Oral Daily  . latanoprost  1 drop Both Eyes QHS  . pantoprazole  40 mg Oral Daily  . sodium chloride flush  3 mL Intravenous Q12H         CBG: Recent Labs  Lab 02/13/20 1412  GLUCAP 102*    SpO2: 93 % O2 Flow Rate (L/min): 1 L/min    CBC: Recent Labs  Lab 02/13/20 1414 02/14/20 0213 02/16/20 0621 02/17/20 1409  WBC 3.4* 3.6* 4.4 3.9*  NEUTROABS  --   --  2.9  --   HGB 11.0* 10.7* 10.6* 12.0  HCT 36.9 34.7* 34.7* 39.4  MCV 103.9* 104.8* 102.4* 105.1*  PLT 121* 126* 105* 105*    Basic Metabolic Panel: Recent Labs  Lab 02/13/20 1920 02/14/20 0213 02/14/20 0213 02/14/20 1708 02/16/20 0621 02/16/20 1658 02/17/20 1052 02/17/20 2021 02/18/20 0733  NA  --  145  --   --  146* 144 144  --  146*  K  --  3.4*   < >  --  4.5 4.3 5.0 4.5 4.5  CL  --  109  --   --  112* 110 110  --  111  CO2  --  22  --   --  23 20* 20*  --  24  GLUCOSE  --  207*  --   --  117* 133* 106*  --  92  BUN  --  104*  --   --  99* 98*  95*  --  93*  CREATININE  --  4.99*  --   --  4.69* 4.56* 4.33*  --  4.49*  CALCIUM  --  8.5*  --   --  8.7* 8.9 9.2  --  9.3  MG   < >  --   --  2.1 2.0 2.0 2.1 2.2  --   PHOS  --   --   --   --  5.6* 5.5*  --   --  6.9*   < > = values in this interval not displayed.     Liver Function Tests: Recent Labs  Lab 02/16/20 0621 02/18/20 0733  AST 16  --   ALT 12  --   ALKPHOS 58  --   BILITOT 0.6  --   PROT 5.5*  --   ALBUMIN 3.1* 3.3*     Antibiotics: Anti-infectives (From admission, onward)   None       DVT prophylaxis: Heparin  Code Status: DNR  Family Communication: No family at bedside   Consultants:  Cardiology  Nephrology  Palliative care  Procedures:      Objective   Vitals:   02/18/20 2240 02/19/20 0440 02/19/20 0500 02/19/20 0820  BP: (!) 125/53 (!) 135/54  (!) 138/53  Pulse: 64 69 67    Resp: 17 16 19 16   Temp:    98.2 F (36.8 C)  TempSrc:    Oral  SpO2: 99% 99% 93%   Weight:      Height:        Intake/Output Summary (Last 24 hours) at 02/19/2020 1737 Last data filed at 02/19/2020 0000 Gross per 24 hour  Intake 120 ml  Output 400 ml  Net -280 ml    11/08 1901 - 11/10 0700 In: 336 [P.O.:270] Out: 900 [Urine:900]  Filed Weights   02/13/20 1407 02/18/20 0500  Weight: 64.9 kg 73.6 kg    Physical Examination:    General-appears in no acute distress  Heart-S1-S2, regular, no murmur auscultated  Lungs-clear to auscultation bilaterally, no wheezing or crackles auscultated  Abdomen-soft, nontender, no organomegaly  Extremities-no edema in the lower extremities  Neuro-somnolent, but arousable, answers questions and then falls back to sleep   Status is: Inpatient  Dispo: The patient is from: Home              Anticipated d/c is to: [employer]              Anticipated d/c date is: 02/20/2020              Patient currently medically stable for discharge, awaiting bed at [place] to discharge-awaiting bed at [place]:   Recent Results (from the past 240 hour(s))  Resp Panel by RT PCR (RSV, Flu A&B, Covid) - Nasopharyngeal Swab     Status: None   Collection Time: 02/13/20  3:45 PM   Specimen: Nasopharyngeal Swab  Result Value Ref Range Status   SARS Coronavirus 2 by RT PCR NEGATIVE NEGATIVE Final    Comment: (NOTE) SARS-CoV-2 target nucleic acids are NOT DETECTED.  The SARS-CoV-2 RNA is generally detectable in upper respiratoy specimens during the acute phase of infection. The lowest concentration of SARS-CoV-2 viral copies this assay can detect is 131 copies/mL. A negative result does not preclude SARS-Cov-2 infection and should not be used as the sole basis for treatment or other patient management decisions. A negative result may occur with  improper specimen collection/handling, submission of  specimen other than nasopharyngeal swab, presence of viral mutation(s) within the areas targeted by this assay, and inadequate number of viral copies (<131 copies/mL). A negative result must be combined with clinical observations, patient history, and epidemiological information. The expected result is Negative.  Fact Sheet for Patients:  PinkCheek.be  Fact Sheet for Healthcare Providers:  GravelBags.it  This test is no t yet approved or cleared by the [place] FDA and  has been authorized for detection and/or diagnosis of SARS-CoV-2 by FDA under an Emergency Use Authorization (EUA). This EUA will remain  in effect (meaning this test can be used) for the duration of the COVID-19 declaration under Section 564(b)(1) of the Act, 21 U.S.C. section 360bbb-3(b)(1), unless the authorization is terminated or revoked sooner.     Influenza A by PCR NEGATIVE NEGATIVE Final   Influenza B by PCR NEGATIVE NEGATIVE Final    Comment: (NOTE) The Xpert Xpress SARS-CoV-2/FLU/RSV assay is intended as an aid in  the diagnosis of influenza from Nasopharyngeal swab specimens and  should not be used as a sole basis for treatment. Nasal washings and  aspirates are unacceptable for Xpert Xpress SARS-CoV-2/FLU/RSV  testing.  Fact Sheet for Patients: PinkCheek.be  Fact Sheet for Healthcare Providers: GravelBags.it  This test is not yet approved or cleared by the [place] FDA and  has been authorized for detection and/or diagnosis of SARS-CoV-2 by  FDA under an Emergency Use Authorization (EUA). This EUA will remain  in effect (meaning this test can be used) for the duration of the  Covid-19 declaration under Section 564(b)(1) of the Act, 21  U.S.C. section 360bbb-3(b)(1), unless the authorization is  terminated or revoked.    Respiratory Syncytial Virus by PCR NEGATIVE NEGATIVE  Final    Comment: (NOTE) Fact Sheet for Patients: PinkCheek.be  Fact Sheet for Healthcare Providers: GravelBags.it  This test is not yet approved or cleared by the [place] FDA and  has been authorized for detection and/or diagnosis of SARS-CoV-2 by  FDA under an Emergency Use Authorization (EUA). This EUA will remain  in effect (meaning this test can be used) for the duration of the  COVID-19 declaration under Section 564(b)(1) of the Act, 21 U.S.C.  section 360bbb-3(b)(1), unless the authorization is terminated or  revoked. Performed at [facility], [place] [address] [place],  [address]     No results for input(s): LIPASE, AMYLASE in the last 168 hours. No results for input(s): AMMONIA in the last 168 hours.  Cardiac Enzymes: No results for input(s): CKTOTAL, CKMB, CKMBINDEX, TROPONINI in the last 168 hours. BNP (last 3 results) Recent Labs    02/13/20 1414 02/15/20 1118 02/16/20 0621  BNP 3,370.2* 4,429.6* >4,500.0*    ProBNP (last 3 results) No results for input(s): PROBNP in the last 8760 hours.  Studies:  VAS [place] LOWER EXTREMITY VENOUS (DVT)  Result Date: 02/18/2020  Lower Venous DVT Study Indications: Swelling, and Pain.  Limitations: Poor ultrasound/tissue interface and poor patient cooperation. Comparison Study: No prior study Performing Technologist: Maudry Mayhew MHA, RDMS, RVT, RDCS  Examination Guidelines: A complete evaluation includes B-mode imaging, spectral Doppler, color Doppler, and power Doppler as needed of all accessible portions of each vessel. Bilateral testing is considered an integral part of a complete examination. Limited examinations for reoccurring indications may be performed as noted. The reflux portion of the exam is performed with the patient in reverse Trendelenburg.  +---------+---------------+---------+-----------+----------+--------------+ RIGHT     CompressibilityPhasicitySpontaneityPropertiesThrombus Aging +---------+---------------+---------+-----------+----------+--------------+  CFV      Full           Yes      Yes                                 +---------+---------------+---------+-----------+----------+--------------+ SFJ      Full                                                        +---------+---------------+---------+-----------+----------+--------------+ FV Prox  Full                                                        +---------+---------------+---------+-----------+----------+--------------+ FV Mid                           Yes        patent                   +---------+---------------+---------+-----------+----------+--------------+ FV Distal                        Yes        patent                   +---------+---------------+---------+-----------+----------+--------------+ PFV      Full                                                        +---------+---------------+---------+-----------+----------+--------------+ POP      Full           Yes      Yes                                 +---------+---------------+---------+-----------+----------+--------------+ PTV      Full                                                        +---------+---------------+---------+-----------+----------+--------------+ PERO     Full                                                        +---------+---------------+---------+-----------+----------+--------------+   Left Technical Findings: Not visualized segments include CFV.   Summary: RIGHT: - There is no evidence of deep vein thrombosis in the lower extremity. However, portions of this examination were limited- see technologist comments above.  - No cystic structure found in the popliteal fossa.   *See table(s) above for measurements and observations. Electronically signed by Curt Jews MD on 02/18/2020 at  2:43:47 PM.    Final         Oswald Hillock   [facility] If 7PM-7AM, please contact night-coverage at [email or web address], Office  [phone]   02/19/2020, 5:37 PM  LOS: 6 days

## 2020-02-20 DIAGNOSIS — R0602 Shortness of breath: Secondary | ICD-10-CM

## 2020-02-20 LAB — SARS CORONAVIRUS 2 BY RT PCR (HOSPITAL ORDER, PERFORMED IN ~~LOC~~ HOSPITAL LAB): SARS Coronavirus 2: NEGATIVE

## 2020-02-20 MED ORDER — POLYVINYL ALCOHOL 1.4 % OP SOLN: 1.0000 [drp] | Freq: Four times a day (QID) | OPHTHALMIC | 0 refills | Status: AC | PRN

## 2020-02-20 MED ORDER — ONDANSETRON 4 MG PO TBDP: 4.0000 mg | ORAL_TABLET | Freq: Four times a day (QID) | ORAL | 0 refills | Status: AC | PRN

## 2020-02-20 MED ORDER — GLYCOPYRROLATE 1 MG PO TABS: 1.0000 mg | ORAL_TABLET | ORAL | Status: AC | PRN

## 2020-02-20 NOTE — Discharge Summary (Signed)
Physician Discharge Summary  Doris Howell GBE:[id number] DOB: Apr 22, 1935 DOA: 02/13/2020  PCP: Lavone Orn, MD  Admit date: 02/13/2020 Discharge date: 02/20/2020  Time spent: 50* minutes  Recommendations for Outpatient Follow-up:  1. Patient to be discharged to residential hospice, [facility]  Discharge Diagnoses:  Principal Problem:   Acute exacerbation of CHF (congestive heart failure) ([place]) Active Problems:   CKD (chronic kidney disease), stage IV ([place])   Essential hypertension   Gout   Gastroesophageal reflux disease without esophagitis   DNR (do not resuscitate)   Comfort measures only status   Palliative care encounter   Discharge Condition: Stable  Diet recommendation: Comfort diet  Filed Weights   02/13/20 1407 02/18/20 0500  Weight: 64.9 kg 73.6 kg    History of present illness:  84 year old woman admitted on 02/13/2020 for shortness of breath and lower extremity edema.  She has past medical history of CHF, CKD, pulmonary artery hypertension.  Hospital Course:  1. Acute on chronic systolic CHF exacerbation-patient was started on Lasix 60 mg every 12 hours, cardiology was consulted.  Patient refused dialysis.  Left heart catheterization was deferred as what it would have affected the kidneys and definitely would have required renal replacement therapy.  Patient has been refusing hemodialysis.  Lasix is currently discontinued, patient was seen by palliative care and is full comfort care only. 2. CKD stage V-patient has solitary kidney, history of left nephrectomy for staghorn calculi.  Worsening renal failure due to decompensated heart failure.  Patient refused hemodialysis.  She was started on diuresis with Lasix and metolazone.  Currently she is off diuretics as patient is comfort measures only. 3. Metabolic encephalopathy-likely from sedatives, no patient is on Dilaudid as needed for pain and shortness of breath.  Continue comfort  measures. 4. Hypertension-continue Coreg 3.125 mg p.o. twice daily 5. Gout continue allopurinol 6. Right leg pain-Doppler was negative for DVT likely from gout continue allopurinol. 7. Goals of care-palliative care was consulted and at this time family has chosen comfort measures only.  No hemodialysis.  Plan to transfer to residential hospice on 02/20/2020.  Procedures:  None  Consultations:  Nephrology  Cardiology  Palliative care  Discharge Exam: Vitals:   02/19/20 0820 02/19/20 2200  BP: (!) 138/53 (!) 106/34  Pulse:  66  Resp: 16 14  Temp: 98.2 F (36.8 C) 97.7 F (36.5 C)  SpO2:  92%    General: Appears in no acute distress Cardiovascular: S1-S2, regular, no murmur auscultated Respiratory: Clear to auscultation bilaterally  Discharge Instructions   Discharge Instructions    Diet - low sodium heart healthy   Complete by: As directed    Increase activity slowly   Complete by: As directed      Allergies as of 02/20/2020      Reactions   Other Nausea Only   pholcodeine   Simvastatin Other (See Comments)   Myalgias- MD took patient off med   Codeine Other (See Comments)   Unknown; pt can't remember. It was in the '70s      Medication List    STOP taking these medications   allopurinol 100 MG tablet Commonly known as: ZYLOPRIM   aspirin EC 81 MG tablet   esomeprazole 40 MG capsule Commonly known as: NEXIUM   nitroGLYCERIN 0.4 MG SL tablet Commonly known as: NITROSTAT   torsemide 20 MG tablet Commonly known as: DEMADEX   Vitamin D (Cholecalciferol) 10 MCG (400 UNIT) Caps     TAKE these medications   albuterol  108 (90 Base) MCG/ACT inhaler Commonly known as: VENTOLIN HFA Inhale 2 puffs into the lungs every 6 (six) hours as needed for wheezing or shortness of breath.   carvedilol 6.25 MG tablet Commonly known as: COREG Take 6.25 mg by mouth 2 (two) times daily with a meal.   glycopyrrolate 1 MG tablet Commonly known as: ROBINUL Take 1  tablet (1 mg total) by mouth every 4 (four) hours as needed (excessive secretions).   hydrALAZINE 25 MG tablet Commonly known as: APRESOLINE Take 25 mg by mouth 2 (two) times daily.   latanoprost 0.005 % ophthalmic solution Commonly known as: XALATAN Place 1 drop into both eyes at bedtime.   ondansetron 4 MG disintegrating tablet Commonly known as: ZOFRAN-ODT Take 1 tablet (4 mg total) by mouth every 6 (six) hours as needed for nausea.   polyvinyl alcohol 1.4 % ophthalmic solution Commonly known as: LIQUIFILM TEARS Place 1 drop into both eyes 4 (four) times daily as needed for dry eyes.   TYLENOL ARTHRITIS PAIN PO Take 2 tablets by mouth daily as needed (pain).      Allergies  Allergen Reactions  . Other Nausea Only    pholcodeine  . Simvastatin Other (See Comments)    Myalgias- MD took patient off med  . Codeine Other (See Comments)    Unknown; pt can't remember. It was in the '70s       The results of significant diagnostics from this hospitalization (including imaging, microbiology, ancillary and laboratory) are listed below for reference.    Significant Diagnostic Studies: CT ABDOMEN PELVIS WO CONTRAST  Result Date: 02/13/2020 CLINICAL DATA:  Recent fall with flank bruising, initial encounter EXAM: CT ABDOMEN AND PELVIS WITHOUT CONTRAST TECHNIQUE: Multidetector CT imaging of the abdomen and pelvis was performed following the standard protocol without IV contrast. COMPARISON:  None. FINDINGS: Lower chest: Small pleural effusions are noted bilaterally right greater than left. Mild bibasilar atelectasis is seen. Cardiomegaly is noted. Hepatobiliary: No focal liver abnormality is seen. No gallstones, gallbladder wall thickening, or biliary dilatation. Pancreas: Unremarkable. No pancreatic ductal dilatation or surrounding inflammatory changes. Spleen: Normal in size without focal abnormality. Adrenals/Urinary Tract: Adrenal glands are within normal limits. Right kidney shows  no obstructive or inflammatory changes. The left kidney has been surgically removed. Dystrophic calcifications in the surgical bed are noted. Bladder is well distended. Stomach/Bowel: Scattered diverticular change of the colon is noted without evidence of diverticulitis. No obstructive changes are seen. Stomach and small bowel are within normal limits. Vascular/Lymphatic: Aortic atherosclerosis. No enlarged abdominal or pelvic lymph nodes. Reproductive: Status post hysterectomy. No adnexal masses. Other: Minimal ascites is noted. Small ventral hernia containing fluid and fat is noted anterior to the liver. Mild changes of anasarca are noted. Musculoskeletal: Degenerative changes of the thoracolumbar spine are noted. No acute bony abnormality is seen. Increased soft tissue density is noted in the left flank consistent with the given clinical history of bruising. No definitive underlying rib fracture is seen. IMPRESSION: Soft tissue changes consistent with the recent fall and flank bruising. No rib fractures are no visceral injury is seen. Status post left nephrectomy. Diverticulosis without diverticulitis. Small anterior abdominal wall hernia containing fat and fluid. Mild ascites and small bilateral effusions. Mild changes of anasarca are noted as well. Stable cardiomegaly. Electronically Signed   By: Inez Catalina M.D.   On: 02/13/2020 18:38   DG Chest 1 View  Result Date: 02/16/2020 CLINICAL DATA:  Shortness of breath. EXAM: CHEST  1 VIEW COMPARISON:  02/13/2020 FINDINGS: Cardiac enlargement. Aortic atherosclerotic calcifications. Pulmonary vascular congestion. No pleural effusion or edema no airspace densities. IMPRESSION: Cardiac enlargement and pulmonary vascular congestion. Electronically Signed   By: Kerby Moors M.D.   On: 02/16/2020 07:06   DG Chest 1 View  Result Date: 02/11/2020 CLINICAL DATA:  84 year old female with shortness of breath. Bilateral leg pain. EXAM: CHEST  1 VIEW COMPARISON:  Chest  radiographs 06/18/2018 and earlier. FINDINGS: AP seated view at 1647 hours. Moderate to severe cardiomegaly, stable when allowing for differences in technique from last year. Calcified aortic atherosclerosis. Other mediastinal contours are within normal limits. Visualized tracheal air column is within normal limits. Mildly lower lung volumes. No pneumothorax or pulmonary edema. No pleural effusion or confluent pulmonary opacity. No acute osseous abnormality identified. IMPRESSION: Stable moderate to severe cardiomegaly. No acute cardiopulmonary abnormality. Electronically Signed   By: Genevie Ann M.D.   On: 02/11/2020 17:04   DG Chest 2 View  Result Date: 02/13/2020 CLINICAL DATA:  Shortness of breath. EXAM: CHEST - 2 VIEW COMPARISON:  02/11/2020. FINDINGS: Cardiomegaly with mild pulmonary venous congestion and bilateral interstitial prominence suggesting mild CHF. Pneumonitis can not be excluded. Low lung volumes with right base atelectasis and or infiltrate. No pleural effusion or pneumothorax. Diffuse osteopenia degenerative change. IMPRESSION: 1. Cardiomegaly with mild pulmonary venous congestion and bilateral interstitial prominence suggesting mild CHF. Pneumonitis can not be excluded. 2.  Low lung volumes with right base atelectasis/infiltrate. Electronically Signed   By: Marcello Moores  Register   On: 02/13/2020 14:43   ECHOCARDIOGRAM COMPLETE  Result Date: 02/14/2020    ECHOCARDIOGRAM REPORT   Patient Name:   ETHELLE OLA Date of Exam: 02/14/2020 Medical Rec #:  [id number]            Height:       61.0 in Accession #:    [id number]           Weight:       143.0 lb Date of Birth:  1935-08-09             BSA:          1.638 m Patient Age:    77 years             BP:           130/53 mmHg Patient Gender: F                    HR:           45 bpm. Exam Location:  Inpatient Procedure: 2D Echo, Cardiac Doppler and Color Doppler Indications:    Dyspnea 786.09 / R06.00  History:        Patient has prior history of  Echocardiogram examinations, most                 recent 05/09/2018. CHF; Risk Factors:Hypertension, Diabetes and                 Dyslipidemia.  Sonographer:    Jonelle Sidle Dance Referring Phys: [id number] [place]  1. LVEF is severely depressed The lateral wall thickens the best; other walls are severly hypokinetic. Left ventricular ejection fraction, by estimation, is 25 to 30%. The left ventricular internal cavity size was mildly dilated. Left ventricular diastolic parameters are indeterminate.  2. Right ventricular systolic function is mildly reduced. The right ventricular size is mildly enlarged. There is moderately elevated pulmonary artery systolic pressure.  3. Left atrial size was severely dilated.  4. Right atrial size was severely dilated.  5. Moderate mitral valve regurgitation.  6. Tricuspid valve regurgitation is mild to moderate.  7. The aortic valve is normal in structure. Aortic valve regurgitation is not visualized.  8. The inferior vena cava is dilated in size with >50% respiratory variability, suggesting right atrial pressure of 8 mmHg. FINDINGS  Left Ventricle: LVEF is severely depressed The lateral wall thickens the best; other walls are severly hypokinetic. Left ventricular ejection fraction, by estimation, is 25 to 30%. The left ventricle has severely decreased function. The left ventricle demonstrates regional wall motion abnormalities. The left ventricular internal cavity size was mildly dilated. There is no left ventricular hypertrophy. Left ventricular diastolic parameters are indeterminate. Right Ventricle: The right ventricular size is mildly enlarged. Right vetricular wall thickness was not assessed. Right ventricular systolic function is mildly reduced. There is moderately elevated pulmonary artery systolic pressure. The tricuspid regurgitant velocity is 3.09 m/s, and with an assumed right atrial pressure of 15 mmHg, the estimated right ventricular systolic pressure is  14.4 mmHg. Left Atrium: Left atrial size was severely dilated. Right Atrium: Right atrial size was severely dilated. Pericardium: Trivial pericardial effusion is present. Mitral Valve: The mitral valve is normal in structure. Moderate mitral valve regurgitation. Tricuspid Valve: The tricuspid valve is normal in structure. Tricuspid valve regurgitation is mild to moderate. Aortic Valve: The aortic valve is normal in structure. Aortic valve regurgitation is not visualized. Pulmonic Valve: The pulmonic valve was normal in structure. Pulmonic valve regurgitation is mild. Aorta: The aortic root is normal in size and structure. Venous: The inferior vena cava is dilated in size with greater than 50% respiratory variability, suggesting right atrial pressure of 8 mmHg. IAS/Shunts: No atrial level shunt detected by color flow Doppler.  LEFT VENTRICLE PLAX 2D LVIDd:         5.55 cm LVIDs:         4.59 cm LV PW:         1.04 cm LV IVS:        0.97 cm LVOT diam:     1.90 cm LV SV:         40 LV SV Index:   24 LVOT Area:     2.84 cm  RIGHT VENTRICLE          IVC RV Basal diam:  4.14 cm  IVC diam: 2.05 cm RV Mid diam:    2.93 cm TAPSE (M-mode): 1.9 cm LEFT ATRIUM              Index       RIGHT ATRIUM           Index LA diam:        4.40 cm  2.69 cm/m  RA Area:     37.90 cm LA Vol (A2C):   105.0 ml 64.11 ml/m RA Volume:   151.00 ml 92.20 ml/m LA Vol (A4C):   82.6 ml  50.43 ml/m LA Biplane Vol: 93.6 ml  57.15 ml/m  AORTIC VALVE LVOT Vmax:   70.20 cm/s LVOT Vmean:  43.650 cm/s LVOT VTI:    0.141 m  AORTA Ao Root diam: 3.40 cm Ao Asc diam:  3.10 cm MITRAL VALVE                TRICUSPID VALVE MV Area (PHT): 3.20 cm     TR Peak grad:   38.2 mmHg MV Decel Time: 237 msec     TR Vmax:  309.00 cm/s MV E velocity: 115.00 cm/s MV A velocity: 48.00 cm/s   SHUNTS MV E/A ratio:  2.40         Systemic VTI:  0.14 m                             Systemic Diam: 1.90 cm Dorris Carnes MD Electronically signed by Dorris Carnes MD Signature  Date/Time: 02/14/2020/6:43:14 PM    Final    VAS [place] LOWER EXTREMITY VENOUS (DVT)  Result Date: 02/18/2020  Lower Venous DVT Study Indications: Swelling, and Pain.  Limitations: Poor ultrasound/tissue interface and poor patient cooperation. Comparison Study: No prior study Performing Technologist: Maudry Mayhew MHA, RDMS, RVT, RDCS  Examination Guidelines: A complete evaluation includes B-mode imaging, spectral Doppler, color Doppler, and power Doppler as needed of all accessible portions of each vessel. Bilateral testing is considered an integral part of a complete examination. Limited examinations for reoccurring indications may be performed as noted. The reflux portion of the exam is performed with the patient in reverse Trendelenburg.  +---------+---------------+---------+-----------+----------+--------------+ RIGHT    CompressibilityPhasicitySpontaneityPropertiesThrombus Aging +---------+---------------+---------+-----------+----------+--------------+ CFV      Full           Yes      Yes                                 +---------+---------------+---------+-----------+----------+--------------+ SFJ      Full                                                        +---------+---------------+---------+-----------+----------+--------------+ FV Prox  Full                                                        +---------+---------------+---------+-----------+----------+--------------+ FV Mid                           Yes        patent                   +---------+---------------+---------+-----------+----------+--------------+ FV Distal                        Yes        patent                   +---------+---------------+---------+-----------+----------+--------------+ PFV      Full                                                        +---------+---------------+---------+-----------+----------+--------------+ POP      Full           Yes      Yes                                  +---------+---------------+---------+-----------+----------+--------------+  PTV      Full                                                        +---------+---------------+---------+-----------+----------+--------------+ PERO     Full                                                        +---------+---------------+---------+-----------+----------+--------------+   Left Technical Findings: Not visualized segments include CFV.   Summary: RIGHT: - There is no evidence of deep vein thrombosis in the lower extremity. However, portions of this examination were limited- see technologist comments above.  - No cystic structure found in the popliteal fossa.   *See table(s) above for measurements and observations. Electronically signed by Curt Jews MD on 02/18/2020 at 2:43:47 PM.    Final     Microbiology: Recent Results (from the past 240 hour(s))  Resp Panel by RT PCR (RSV, Flu A&B, Covid) - Nasopharyngeal Swab     Status: None   Collection Time: 02/13/20  3:45 PM   Specimen: Nasopharyngeal Swab  Result Value Ref Range Status   SARS Coronavirus 2 by RT PCR NEGATIVE NEGATIVE Final    Comment: (NOTE) SARS-CoV-2 target nucleic acids are NOT DETECTED.  The SARS-CoV-2 RNA is generally detectable in upper respiratoy specimens during the acute phase of infection. The lowest concentration of SARS-CoV-2 viral copies this assay can detect is 131 copies/mL. A negative result does not preclude SARS-Cov-2 infection and should not be used as the sole basis for treatment or other patient management decisions. A negative result may occur with  improper specimen collection/handling, submission of specimen other than nasopharyngeal swab, presence of viral mutation(s) within the areas targeted by this assay, and inadequate number of viral copies (<131 copies/mL). A negative result must be combined with clinical observations, patient history, and epidemiological information.  The expected result is Negative.  Fact Sheet for Patients:  PinkCheek.be  Fact Sheet for Healthcare Providers:  GravelBags.it  This test is no t yet approved or cleared by the [place] FDA and  has been authorized for detection and/or diagnosis of SARS-CoV-2 by FDA under an Emergency Use Authorization (EUA). This EUA will remain  in effect (meaning this test can be used) for the duration of the COVID-19 declaration under Section 564(b)(1) of the Act, 21 U.S.C. section 360bbb-3(b)(1), unless the authorization is terminated or revoked sooner.     Influenza A by PCR NEGATIVE NEGATIVE Final   Influenza B by PCR NEGATIVE NEGATIVE Final    Comment: (NOTE) The Xpert Xpress SARS-CoV-2/FLU/RSV assay is intended as an aid in  the diagnosis of influenza from Nasopharyngeal swab specimens and  should not be used as a sole basis for treatment. Nasal washings and  aspirates are unacceptable for Xpert Xpress SARS-CoV-2/FLU/RSV  testing.  Fact Sheet for Patients: PinkCheek.be  Fact Sheet for Healthcare Providers: GravelBags.it  This test is not yet approved or cleared by the [place] FDA and  has been authorized for detection and/or diagnosis of SARS-CoV-2 by  FDA under an Emergency Use Authorization (EUA). This EUA will remain  in effect (meaning this test can be used) for the  duration of the  Covid-19 declaration under Section 564(b)(1) of the Act, 21  U.S.C. section 360bbb-3(b)(1), unless the authorization is  terminated or revoked.    Respiratory Syncytial Virus by PCR NEGATIVE NEGATIVE Final    Comment: (NOTE) Fact Sheet for Patients: PinkCheek.be  Fact Sheet for Healthcare Providers: GravelBags.it  This test is not yet approved or cleared by the [place] FDA and  has been authorized for  detection and/or diagnosis of SARS-CoV-2 by  FDA under an Emergency Use Authorization (EUA). This EUA will remain  in effect (meaning this test can be used) for the duration of the  COVID-19 declaration under Section 564(b)(1) of the Act, 21 U.S.C.  section 360bbb-3(b)(1), unless the authorization is terminated or  revoked. Performed at [facility], [place] [address], [place], [place] [address]   SARS Coronavirus 2 by RT PCR (hospital order, performed in [facility] hospital lab) Nasopharyngeal Nasopharyngeal Swab     Status: None   Collection Time: 02/20/20  6:00 AM   Specimen: Nasopharyngeal Swab  Result Value Ref Range Status   SARS Coronavirus 2 NEGATIVE NEGATIVE Final    Comment: (NOTE) SARS-CoV-2 target nucleic acids are NOT DETECTED.  The SARS-CoV-2 RNA is generally detectable in upper and lower respiratory specimens during the acute phase of infection. The lowest concentration of SARS-CoV-2 viral copies this assay can detect is 250 copies / mL. A negative result does not preclude SARS-CoV-2 infection and should not be used as the sole basis for treatment or other patient management decisions.  A negative result may occur with improper specimen collection / handling, submission of specimen other than nasopharyngeal swab, presence of viral mutation(s) within the areas targeted by this assay, and inadequate number of viral copies (<250 copies / mL). A negative result must be combined with clinical observations, patient history, and epidemiological information.  Fact Sheet for Patients:   StrictlyIdeas.no  Fact Sheet for Healthcare Providers: BankingDealers.co.za  This test is not yet approved or  cleared by the [place] FDA and has been authorized for detection and/or diagnosis of SARS-CoV-2 by FDA under an Emergency Use Authorization (EUA).  This EUA will remain in effect (meaning this test can be used) for the  duration of the COVID-19 declaration under Section 564(b)(1) of the Act, 21 U.S.C. section 360bbb-3(b)(1), unless the authorization is terminated or revoked sooner.  Performed at [facility], [place] [address], [place], [place] [address]      Labs: Basic Metabolic Panel: Recent Labs  Lab 02/13/20 1920 02/14/20 0213 02/14/20 0213 02/14/20 1708 02/16/20 0621 02/16/20 1658 02/17/20 1052 02/17/20 2021 02/18/20 0733  NA  --  145  --   --  146* 144 144  --  146*  K  --  3.4*   < >  --  4.5 4.3 5.0 4.5 4.5  CL  --  109  --   --  112* 110 110  --  111  CO2  --  22  --   --  23 20* 20*  --  24  GLUCOSE  --  207*  --   --  117* 133* 106*  --  92  BUN  --  104*  --   --  99* 98* 95*  --  93*  CREATININE  --  4.99*  --   --  4.69* 4.56* 4.33*  --  4.49*  CALCIUM  --  8.5*  --   --  8.7* 8.9 9.2  --  9.3  MG   < >  --   --  2.1 2.0 2.0 2.1 2.2  --   PHOS  --   --   --   --  5.6* 5.5*  --   --  6.9*   < > = values in this interval not displayed.   Liver Function Tests: Recent Labs  Lab 02/16/20 0621 02/18/20 0733  AST 16  --   ALT 12  --   ALKPHOS 58  --   BILITOT 0.6  --   PROT 5.5*  --   ALBUMIN 3.1* 3.3*   CBC: Recent Labs  Lab 02/13/20 1414 02/14/20 0213 02/16/20 0621 02/17/20 1409  WBC 3.4* 3.6* 4.4 3.9*  NEUTROABS  --   --  2.9  --   HGB 11.0* 10.7* 10.6* 12.0  HCT 36.9 34.7* 34.7* 39.4  MCV 103.9* 104.8* 102.4* 105.1*  PLT 121* 126* 105* 105*   Cardiac Enzymes: No results for input(s): CKTOTAL, CKMB, CKMBINDEX, TROPONINI in the last 168 hours. BNP: BNP (last 3 results) Recent Labs    02/13/20 1414 02/15/20 1118 02/16/20 0621  BNP 3,370.2* 4,429.6* >4,500.0*     CBG: Recent Labs  Lab 02/13/20 1412  GLUCAP 102*       Signed:  Oswald Hillock MD.  [facility] 02/20/2020, 11:52 AM

## 2020-02-20 NOTE — Progress Notes (Signed)
Manufacturing engineer [facility]) Hospital Liaison note.    At this time registration paperwork has been completed. Please arrange transport.   RN, please call report to [phone]. Please be sure the DNR form transports with [facility].   Thank you for the opportunity to participate in this patients care.  Domenic Moras, BSN, RN [place] (listed on [place] under [facility])    [phone]

## 2020-02-20 NOTE — TOC Transition Note (Signed)
Transition of Care (TOC) - CM/SW Discharge Note Marvetta Gibbons RN, BSN Transitions of Care Unit 4E- RN Case Manager See Treatment Team for direct phone #    Patient Details  Name: Doris Howell MRN: [id number] Date of Birth: 02-05-1936  Transition of Care [facility]) CM/SW Contact:  Dawayne [name], RN Phone Number: 02/20/2020, 1:12 PM   Clinical Narrative:    Notified by [facility] that paperwork has been completed by the family at [facility] and they are ready for pt to transport to [facility] today. D/c order has been placed and GOLD DNR signed for transport. Bedside RN updated and number to call report in [facility] note.   Call made to both pt's spouse and niece to notify that transport was being called.   [facility] called for transport at [phone]- paperwork placed on shadow chart.   Final next level of care: [place] Barriers to Discharge: Barriers Resolved   Patient Goals and CMS Choice Patient states their goals for this hospitalization and ongoing recovery are:: Comfort care   Choice offered to / list presented to : Adult Children, [place] / [place]  Discharge Placement    [place]              Patient to be transferred to facility by: R[place]t Name of family member notified: Rito Ehrlich and Mardene Celeste Patient and family notified of of transfer: 02/20/20  Discharge Plan and Services In-house Referral: Clinical Social Work Discharge Planning Services: CM Consult Post Acute Care Choice: Hospice (per PC team)                      [place] ([place] referral) Date [place]: 02/19/20 Time [place]: [place] Representative spoke with at [place]: [place] Determinants of Health ([place]) Interventions     Readmission Risk Interventions Readmission Risk Prevention Plan 02/20/2020  Transportation Screening Complete  PCP or Specialist Appt within 5-7 Days  Complete  Home Care Screening Complete  Medication Review (RN CM) Complete  Some recent data might be hidden

## 2020-03-03 ENCOUNTER — Ambulatory Visit: Payer: Medicare Other | Admitting: Cardiovascular Disease

## 2020-03-11 DEATH — deceased

## 2020-05-13 ENCOUNTER — Ambulatory Visit: Payer: Self-pay | Admitting: Podiatry

## 2020-06-29 ENCOUNTER — Ambulatory Visit: Payer: Medicare Other | Admitting: Cardiovascular Disease
